# Patient Record
Sex: Female | Born: 1955
Health system: Southern US, Community
[De-identification: ages and names within clinical notes are randomized; demographics above are authoritative.]

## PROBLEM LIST (undated history)

## (undated) DIAGNOSIS — Z9189 Other specified personal risk factors, not elsewhere classified: Secondary | ICD-10-CM

## (undated) DIAGNOSIS — C3490 Malignant neoplasm of unspecified part of unspecified bronchus or lung: Secondary | ICD-10-CM

## (undated) DIAGNOSIS — E7800 Pure hypercholesterolemia, unspecified: Secondary | ICD-10-CM

## (undated) DIAGNOSIS — D630 Anemia in neoplastic disease: Secondary | ICD-10-CM

## (undated) DIAGNOSIS — I10 Essential (primary) hypertension: Secondary | ICD-10-CM

## (undated) DIAGNOSIS — D649 Anemia, unspecified: Secondary | ICD-10-CM

## (undated) DIAGNOSIS — K219 Gastro-esophageal reflux disease without esophagitis: Secondary | ICD-10-CM

## (undated) DIAGNOSIS — F419 Anxiety disorder, unspecified: Secondary | ICD-10-CM

## (undated) HISTORY — DX: Anxiety disorder, unspecified: F41.9

## (undated) HISTORY — DX: Essential (primary) hypertension: I10

## (undated) HISTORY — DX: Malignant neoplasm of unspecified part of unspecified bronchus or lung: C34.90

## (undated) HISTORY — PX: INSERTION / PLACEMENT PLEURAL CATHETER: SUR721

## (undated) HISTORY — DX: Anemia in neoplastic disease: D63.0

## (undated) HISTORY — PX: ABDOMINAL HYSTERECTOMY: SHX81

## (undated) HISTORY — DX: Anemia, unspecified: D64.9

## (undated) HISTORY — DX: Other specified personal risk factors, not elsewhere classified: Z91.89

## (undated) HISTORY — DX: Pure hypercholesterolemia, unspecified: E78.00

---

## 1997-12-21 ENCOUNTER — Ambulatory Visit: Admission: RE | Admit: 1997-12-21 | Discharge: 1997-12-21 | Payer: Self-pay | Admitting: General Surgery

## 1997-12-22 ENCOUNTER — Ambulatory Visit (HOSPITAL_COMMUNITY): Admission: RE | Admit: 1997-12-22 | Discharge: 1997-12-22 | Payer: Self-pay | Admitting: Obstetrics

## 1998-02-25 ENCOUNTER — Encounter: Admission: RE | Admit: 1998-02-25 | Discharge: 1998-02-25 | Payer: Self-pay | Admitting: Hematology and Oncology

## 1998-04-16 ENCOUNTER — Emergency Department (HOSPITAL_COMMUNITY): Admission: EM | Admit: 1998-04-16 | Discharge: 1998-04-16 | Payer: Self-pay | Admitting: Emergency Medicine

## 1999-02-03 ENCOUNTER — Other Ambulatory Visit: Admission: RE | Admit: 1999-02-03 | Discharge: 1999-02-03 | Payer: Self-pay | Admitting: Obstetrics

## 1999-02-03 ENCOUNTER — Encounter: Admission: RE | Admit: 1999-02-03 | Discharge: 1999-02-03 | Payer: Self-pay | Admitting: Obstetrics

## 1999-03-08 ENCOUNTER — Encounter: Payer: Self-pay | Admitting: Obstetrics

## 1999-03-08 ENCOUNTER — Ambulatory Visit (HOSPITAL_COMMUNITY): Admission: RE | Admit: 1999-03-08 | Discharge: 1999-03-08 | Payer: Self-pay | Admitting: Obstetrics

## 2000-11-20 ENCOUNTER — Encounter: Admission: RE | Admit: 2000-11-20 | Discharge: 2000-11-20 | Payer: Self-pay | Admitting: Obstetrics & Gynecology

## 2000-11-21 ENCOUNTER — Encounter: Payer: Self-pay | Admitting: Obstetrics

## 2000-11-21 ENCOUNTER — Ambulatory Visit (HOSPITAL_COMMUNITY): Admission: RE | Admit: 2000-11-21 | Discharge: 2000-11-21 | Payer: Self-pay | Admitting: Internal Medicine

## 2000-11-22 ENCOUNTER — Other Ambulatory Visit: Admission: RE | Admit: 2000-11-22 | Discharge: 2000-11-22 | Payer: Self-pay | Admitting: Obstetrics

## 2000-11-22 ENCOUNTER — Encounter: Admission: RE | Admit: 2000-11-22 | Discharge: 2000-11-22 | Payer: Self-pay | Admitting: Obstetrics

## 2003-07-16 ENCOUNTER — Encounter (INDEPENDENT_AMBULATORY_CARE_PROVIDER_SITE_OTHER): Payer: Self-pay | Admitting: Specialist

## 2003-07-16 ENCOUNTER — Inpatient Hospital Stay (HOSPITAL_COMMUNITY): Admission: RE | Admit: 2003-07-16 | Discharge: 2003-07-18 | Payer: Self-pay | Admitting: Obstetrics

## 2004-10-19 ENCOUNTER — Ambulatory Visit: Payer: Self-pay | Admitting: Gastroenterology

## 2004-10-21 ENCOUNTER — Ambulatory Visit (HOSPITAL_COMMUNITY): Admission: RE | Admit: 2004-10-21 | Discharge: 2004-10-21 | Payer: Self-pay | Admitting: Gastroenterology

## 2004-10-26 ENCOUNTER — Ambulatory Visit: Payer: Self-pay | Admitting: Gastroenterology

## 2005-02-20 ENCOUNTER — Encounter: Admission: RE | Admit: 2005-02-20 | Discharge: 2005-02-20 | Payer: Self-pay | Admitting: Orthopaedic Surgery

## 2005-03-09 ENCOUNTER — Encounter: Admission: RE | Admit: 2005-03-09 | Discharge: 2005-03-09 | Payer: Self-pay | Admitting: Orthopaedic Surgery

## 2005-09-18 DIAGNOSIS — Z789 Other specified health status: Secondary | ICD-10-CM

## 2005-09-18 HISTORY — DX: Other specified health status: Z78.9

## 2005-10-19 ENCOUNTER — Encounter: Admission: RE | Admit: 2005-10-19 | Discharge: 2005-10-19 | Payer: Self-pay | Admitting: Orthopaedic Surgery

## 2006-01-05 ENCOUNTER — Ambulatory Visit (HOSPITAL_COMMUNITY): Admission: RE | Admit: 2006-01-05 | Discharge: 2006-01-05 | Payer: Self-pay | Admitting: Orthopaedic Surgery

## 2006-01-08 ENCOUNTER — Encounter: Admission: RE | Admit: 2006-01-08 | Discharge: 2006-04-08 | Payer: Self-pay | Admitting: Orthopaedic Surgery

## 2007-05-08 ENCOUNTER — Encounter: Admission: RE | Admit: 2007-05-08 | Discharge: 2007-05-08 | Payer: Self-pay | Admitting: Orthopaedic Surgery

## 2007-12-13 ENCOUNTER — Encounter: Admission: RE | Admit: 2007-12-13 | Discharge: 2007-12-13 | Payer: Self-pay | Admitting: Orthopaedic Surgery

## 2008-02-03 ENCOUNTER — Encounter: Admission: RE | Admit: 2008-02-03 | Discharge: 2008-03-05 | Payer: Self-pay | Admitting: Orthopaedic Surgery

## 2008-04-29 ENCOUNTER — Encounter: Admission: RE | Admit: 2008-04-29 | Discharge: 2008-06-29 | Payer: Self-pay | Admitting: Orthopaedic Surgery

## 2008-05-09 ENCOUNTER — Emergency Department: Payer: Self-pay | Admitting: Unknown Physician Specialty

## 2008-08-27 ENCOUNTER — Encounter: Admission: RE | Admit: 2008-08-27 | Discharge: 2008-08-27 | Payer: Self-pay | Admitting: Orthopaedic Surgery

## 2008-09-09 ENCOUNTER — Encounter: Admission: RE | Admit: 2008-09-09 | Discharge: 2008-09-09 | Payer: Self-pay | Admitting: Orthopaedic Surgery

## 2009-07-26 ENCOUNTER — Emergency Department (HOSPITAL_COMMUNITY): Admission: EM | Admit: 2009-07-26 | Discharge: 2009-07-26 | Payer: Self-pay | Admitting: Family Medicine

## 2010-04-07 ENCOUNTER — Ambulatory Visit: Payer: Self-pay | Admitting: Family Medicine

## 2010-10-09 ENCOUNTER — Encounter: Payer: Self-pay | Admitting: Internal Medicine

## 2010-12-21 LAB — POCT I-STAT, CHEM 8
BUN: 19 mg/dL (ref 6–23)
Calcium, Ion: 1.17 mmol/L (ref 1.12–1.32)
Chloride: 105 mEq/L (ref 96–112)
Glucose, Bld: 100 mg/dL — ABNORMAL HIGH (ref 70–99)
Hemoglobin: 14.6 g/dL (ref 12.0–15.0)
Sodium: 138 mEq/L (ref 135–145)
TCO2: 24 mmol/L (ref 0–100)

## 2010-12-21 LAB — POCT URINALYSIS DIP (DEVICE)
Specific Gravity, Urine: 1.025 (ref 1.005–1.030)
pH: 6 (ref 5.0–8.0)

## 2011-02-03 NOTE — Op Note (Signed)
NAMESHAM, Patricia                      ACCOUNT NO.:  [id number]   MEDICAL RECORD NO.:  [id number]                   PATIENT TYPE:  INP   LOCATION:  9399                                 FACILITY:  [facility]   PHYSICIAN:  Charles A. Clearance Coots, M.D.             DATE OF BIRTH:  Feb 18, 1956   DATE OF PROCEDURE:  07/16/2003  DATE OF DISCHARGE:                                 OPERATIVE REPORT   PREOPERATIVE DIAGNOSES:  1. Symptomatic uterine fibroids.  2. Menorrhagia.   POSTOPERATIVE DIAGNOSES:  1. Symptomatic uterine fibroids.  2. Menorrhagia.   PROCEDURE:  Total abdominal hysterectomy, bilateral salpingectomy.   SURGEON:  Charles A. Clearance Coots, M.D.   ASSISTANT:  Roseanna Rainbow, M.D.   ANESTHESIA:  General.   ESTIMATED BLOOD LOSS:  500 mL.   FLUIDS REPLACED:  2000 mL.   URINE OUTPUT:  300 mL clear.   COMPLICATIONS:  None.   Foley to gravity.   FINDINGS:  Multiple uterine fibroids.   SPECIMENS:  Uterus with cervix, fibroids, two fallopian tubes.   OPERATION:  The patient was brought to the operating room and after  satisfactory general endotracheal anesthesia, the abdomen was prepped and  draped in the usual sterile fashion.  A Pfannenstiel skin incision was made  with a scalpel that was deepened down to the fascia with a scalpel.  The  fascia was nicked in the midline and the fascial incision was extended to  the left and to the right with curved Mayo scissors.  Superior and inferior  fascial edges were taken off of the rectus muscle with both blunt and sharp  dissection.  The rectus muscle was bluntly and sharply divided in the  midline superiorly and inferiorly, being careful to avoid the urinary  bladder inferiorly.  The peritoneum was entered.  The peritoneum was entered  digitally and was sharply extended superiorly and inferiorly, being careful  to avoid the urinary bladder inferiorly.  The uterus was then identified and  was exteriorized with multiple  subserosal and intramural fibroids.  The  right round ligament was then grasped with Kelly forceps and was cauterized  sharply with the Bovie down to the anterior reflection of the visceral  peritoneum of the urinary bladder.  Then the anterior reflection of the  bladder visceral peritoneum was then sharply divided anteriorly.  A window  was then made medially in the broad ligament digitally and parametrial  clamps were placed across the broad and utero-ovarian ligaments.  The medial  clamp was then also slid into the window created in the broad ligament and  the utero-ovarian and broad ligaments were then transected between the  clamps and a free tie of 0 Vicryl was placed beneath the pedicle clamp and a  second transfixion suture of 0 Vicryl was placed above the knot.  Hemostasis  was excellent.  The same procedure was performed on the opposite side  without complications.  The uterine vessels  were then skeletonized and  clamped bilaterally, transected, and suture ligated with transfixion sutures  of 0 Vicryl.  The urinary bladder was then sharply transected away from the  anterior cervix and dropped down away from the operative field.  The  cardinal ligaments were then serially clamped, transected, and suture  ligated bilaterally with transfixion sutures of 0 Vicryl down to the  uterosacral ligaments.  The uterosacral ligaments were then clamped,  transected bilaterally, and suture ligated with transfixion sutures of 0  Vicryl, and this suture was held with the hemostat for future identification  and closure of the cuff.  The two parametrial clamps were then placed across  the vaginal cuff and the cervical end of the uterus was transected and the  cervix along with the uterus was handed off as a specimen for pathology.  The vaginal cuff was then closed with each corner being closed with a  transfixion suture of 0 Vicryl.  The center of the vaginal cuff was closed  with a figure-of-eight  suture of 0 Vicryl.  Hemostasis was excellent.  Attention was then turned to the fallopian tubes and the left fallopian tube  was grasped wit a Babcock clamp and a Kelly forceps was placed across the  proximal end of the fallopian tube, extending beneath the fimbriae, and the  fallopian tube was then transected and the stump was suture ligated with a  transfixion suture of 0 Vicryl.  The same procedure was performed on the  opposite side without complications.  The pelvic cavity was then thoroughly  irrigated with warm saline solution and all clots were removed.  The  vascular stumps were observed for hemostasis and there was no active  bleeding noted from any of the vascular stumps, and the vaginal cuff was  also inspected for hemostasis and there was no active bleeding noted.  The  pelvic cavity was again lavaged with warm saline solution.  There was no  active bleeding noted.  The O'Connor-O'Sullivan retractor that had been  placed in the incision was then removed and all packing was removed.  Surgical technician indicated that all needle, sponge, and instrument counts  were correct.  The abdomen was then closed as follows:  The peritoneum was  closed with a continuous suture of 2-0 Monocryl, the fascia was closed with  a continuous suture of 0 PDS from each corner to the center, the  subcutaneous tissue was thoroughly irrigated with warm saline solution and  all areas of subcutaneous bleeding were coagulated with the Bovie.  The skin  was then approximated with continuous subcuticular suture of 3-0 Monocryl.  A sterile bandage was applied to the incision closure.  The surgical  technician indicated that all needle, sponge, and instrument counts were  correct x2.  The patient tolerated the procedure well, was transported to  the recovery room in satisfactory condition.                                                Charles A. Clearance Coots, M.D.    CAH/MEDQ  D:  07/16/2003  T:   07/16/2003  Job:  [id number]

## 2011-12-19 ENCOUNTER — Ambulatory Visit: Payer: Self-pay | Admitting: Family Medicine

## 2013-11-06 ENCOUNTER — Other Ambulatory Visit: Payer: Self-pay | Admitting: Neurological Surgery

## 2013-11-06 DIAGNOSIS — M5000 Cervical disc disorder with myelopathy, unspecified cervical region: Secondary | ICD-10-CM

## 2013-11-15 ENCOUNTER — Ambulatory Visit
Admission: RE | Admit: 2013-11-15 | Discharge: 2013-11-15 | Disposition: A | Discharge status: Home / Self Care | Payer: 59 | Source: Ambulatory Visit | Attending: Neurological Surgery | Admitting: Neurological Surgery

## 2013-11-15 DIAGNOSIS — M5000 Cervical disc disorder with myelopathy, unspecified cervical region: Secondary | ICD-10-CM

## 2014-11-17 ENCOUNTER — Ambulatory Visit
Admit: 2014-11-17 | Disposition: A | Discharge status: Home / Self Care | Payer: Self-pay | Attending: Internal Medicine | Admitting: Internal Medicine

## 2014-11-30 ENCOUNTER — Inpatient Hospital Stay: Payer: Self-pay | Admitting: Internal Medicine

## 2014-12-08 ENCOUNTER — Ambulatory Visit
Admit: 2014-12-08 | Disposition: A | Discharge status: Home / Self Care | Payer: Self-pay | Attending: Internal Medicine | Admitting: Internal Medicine

## 2014-12-09 ENCOUNTER — Ambulatory Visit: Payer: Self-pay | Admitting: Internal Medicine

## 2014-12-10 ENCOUNTER — Ambulatory Visit: Admit: 2014-12-10 | Disposition: A | Discharge status: Home / Self Care | Payer: Self-pay | Admitting: Internal Medicine

## 2014-12-14 LAB — CBC CANCER CENTER
Basophil #: 0.2 "x10 3/mm " — ABNORMAL HIGH
Basophil %: 0.7 %
Eosinophil #: 0.4 "x10 3/mm "
Eosinophil %: 1.3 %
HCT: 34.5 % — ABNORMAL LOW
HGB: 11.3 g/dL — ABNORMAL LOW
Lymphocyte %: 5.8 %
Lymphs Abs: 1.6 "x10 3/mm "
MCH: 25.9 pg — ABNORMAL LOW
MCHC: 32.8 g/dL
MCV: 79 fL — ABNORMAL LOW
Monocyte #: 1.7 "x10 3/mm " — ABNORMAL HIGH
Monocyte %: 6.1 %
Neutrophil #: 23.4 "x10 3/mm " — ABNORMAL HIGH
Neutrophil %: 86.1 %
Platelet: 472 "x10 3/mm " — ABNORMAL HIGH
RBC: 4.37 "x10 6/mm "
RDW: 13.1 %
WBC: 27.2 "x10 3/mm " — ABNORMAL HIGH

## 2014-12-14 LAB — COMPREHENSIVE METABOLIC PANEL
ALBUMIN: 3.1 g/dL — AB
ALK PHOS: 49 U/L
ALT: 45 U/L
Anion Gap: 5 — ABNORMAL LOW (ref 7–16)
BUN: 30 mg/dL — AB
Bilirubin,Total: 0.4 mg/dL
CALCIUM: 8.6 mg/dL — AB
CO2: 24 mmol/L
CREATININE: 0.65 mg/dL
Chloride: 102 mmol/L
EGFR (African American): >60
EGFR (Non-African Amer.): >60
GLUCOSE: 136 mg/dL — AB
Potassium: 3.9 mmol/L
SGOT(AST): 35 U/L
SODIUM: 131 mmol/L — AB
Total Protein: 6.2 g/dL — ABNORMAL LOW

## 2014-12-18 ENCOUNTER — Ambulatory Visit
Admit: 2014-12-18 | Disposition: A | Discharge status: Home / Self Care | Payer: Self-pay | Attending: Internal Medicine | Admitting: Internal Medicine

## 2014-12-21 LAB — CBC CANCER CENTER
BASOS PCT: 0.4 %
Basophil #: 0.1 x10 3/mm (ref 0.0–0.1)
EOS PCT: 1 %
Eosinophil #: 0.3 x10 3/mm (ref 0.0–0.7)
HCT: 38.4 % (ref 35.0–47.0)
HGB: 12.3 g/dL (ref 12.0–16.0)
LYMPHS PCT: 6.4 %
Lymphocyte #: 1.7 x10 3/mm (ref 1.0–3.6)
MCH: 25.6 pg — ABNORMAL LOW (ref 26.0–34.0)
MCHC: 32.1 g/dL (ref 32.0–36.0)
MCV: 80 fL (ref 80–100)
Monocyte #: 1.1 x10 3/mm — ABNORMAL HIGH (ref 0.2–0.9)
Monocyte %: 4 %
NEUTROS PCT: 88.2 %
Neutrophil #: 23.9 x10 3/mm — ABNORMAL HIGH (ref 1.4–6.5)
PLATELETS: 389 x10 3/mm (ref 150–440)
RBC: 4.8 10*6/uL (ref 3.80–5.20)
RDW: 13.1 % (ref 11.5–14.5)
WBC: 27.1 x10 3/mm — ABNORMAL HIGH (ref 3.6–11.0)

## 2014-12-29 ENCOUNTER — Inpatient Hospital Stay
Admit: 2014-12-29 | Disposition: A | Discharge status: Home / Self Care | Payer: Self-pay | Attending: Internal Medicine | Admitting: Internal Medicine

## 2014-12-29 LAB — URINALYSIS, COMPLETE
Bilirubin,UR: NEGATIVE
Glucose,UR: NEGATIVE mg/dL (ref 0–75)
Ketone: NEGATIVE
Nitrite: NEGATIVE
PROTEIN: NEGATIVE
Ph: 5 (ref 4.5–8.0)

## 2014-12-29 LAB — DIFFERENTIAL
Basophil #: 0 10*3/uL (ref 0.0–0.1)
Basophil %: 0.2 %
EOS ABS: 0 10*3/uL (ref 0.0–0.7)
Eosinophil %: 1.4 %
LYMPHS PCT: 29.3 %
Lymphocyte #: 0.9 10*3/uL — ABNORMAL LOW (ref 1.0–3.6)
MONOS PCT: 10 %
Monocyte #: 0.3 x10 3/mm (ref 0.2–0.9)
Neutrophil #: 1.8 10*3/uL (ref 1.4–6.5)
Neutrophil %: 59.1 %

## 2014-12-29 LAB — COMPREHENSIVE METABOLIC PANEL
ALK PHOS: 77 U/L
ANION GAP: 11 (ref 7–16)
AST: 26 U/L
Albumin: 3 g/dL — ABNORMAL LOW
BILIRUBIN TOTAL: 1.7 mg/dL — AB
BUN: 30 mg/dL — ABNORMAL HIGH
CALCIUM: 8.5 mg/dL — AB
Chloride: 100 mmol/L — ABNORMAL LOW
Co2: 22 mmol/L
Creatinine: 0.78 mg/dL
EGFR (African American): >60
EGFR (Non-African Amer.): >60
Glucose: 118 mg/dL — ABNORMAL HIGH
Potassium: 4.3 mmol/L
SGPT (ALT): 57 U/L — ABNORMAL HIGH
Sodium: 133 mmol/L — ABNORMAL LOW
Total Protein: 6.8 g/dL

## 2014-12-29 LAB — TROPONIN I: Troponin-I: <0.03 ng/mL

## 2014-12-29 LAB — PROTIME-INR
INR: 1.1
Prothrombin Time: 13.9 secs

## 2014-12-29 LAB — CBC
HCT: 33.8 % — ABNORMAL LOW (ref 35.0–47.0)
HGB: 11 g/dL — AB (ref 12.0–16.0)
MCH: 26.2 pg (ref 26.0–34.0)
MCHC: 32.4 g/dL (ref 32.0–36.0)
MCV: 81 fL (ref 80–100)
Platelet: 158 10*3/uL (ref 150–440)
RBC: 4.19 10*6/uL (ref 3.80–5.20)
RDW: 13.9 % (ref 11.5–14.5)
WBC: 3 10*3/uL — AB (ref 3.6–11.0)

## 2014-12-29 LAB — RAPID HIV SCREEN (HIV 1/2 AB+AG)

## 2014-12-29 LAB — LACTIC ACID, PLASMA
Lactic Acid, Venous: 2.6 mmol/L
Lactic Acid, Venous: 2.4 mmol/L

## 2014-12-29 LAB — APTT: Activated PTT: 28.8 secs (ref 23.6–35.9)

## 2014-12-30 LAB — CBC WITH DIFFERENTIAL/PLATELET
BASOS PCT: 0.1 %
Basophil #: 0 10*3/uL (ref 0.0–0.1)
EOS PCT: 3.5 %
Eosinophil #: 0.2 10*3/uL (ref 0.0–0.7)
HCT: 32.2 % — AB (ref 35.0–47.0)
HGB: 10.3 g/dL — AB (ref 12.0–16.0)
LYMPHS PCT: 10.3 %
Lymphocyte #: 0.7 10*3/uL — ABNORMAL LOW (ref 1.0–3.6)
MCH: 26.3 pg (ref 26.0–34.0)
MCHC: 32.2 g/dL (ref 32.0–36.0)
MCV: 82 fL (ref 80–100)
MONOS PCT: 7.9 %
Monocyte #: 0.5 x10 3/mm (ref 0.2–0.9)
NEUTROS ABS: 5.3 10*3/uL (ref 1.4–6.5)
Neutrophil %: 78.2 %
Platelet: 164 10*3/uL (ref 150–440)
RBC: 3.94 10*6/uL (ref 3.80–5.20)
RDW: 14 % (ref 11.5–14.5)
WBC: 6.8 10*3/uL (ref 3.6–11.0)

## 2014-12-30 LAB — COMPREHENSIVE METABOLIC PANEL
Albumin: 1.9 g/dL — ABNORMAL LOW
Alkaline Phosphatase: 87 U/L
Anion Gap: 5 — ABNORMAL LOW (ref 7–16)
BILIRUBIN TOTAL: 1.9 mg/dL — AB
BUN: 18 mg/dL
CO2: 20 mmol/L — AB
CREATININE: 0.58 mg/dL
Calcium, Total: 7.1 mg/dL — ABNORMAL LOW
Chloride: 108 mmol/L
EGFR (Non-African Amer.): >60
GLUCOSE: 114 mg/dL — AB
Potassium: 4.2 mmol/L
SGOT(AST): 24 U/L
SGPT (ALT): 46 U/L
Sodium: 133 mmol/L — ABNORMAL LOW
Total Protein: 5.3 g/dL — ABNORMAL LOW

## 2014-12-30 LAB — PROTIME-INR
INR: 1.5
Prothrombin Time: 17.9 secs — ABNORMAL HIGH

## 2014-12-30 LAB — MAGNESIUM: MAGNESIUM: 1.6 mg/dL — AB

## 2014-12-31 LAB — VANCOMYCIN, TROUGH: VANCOMYCIN, TROUGH: 8 ug/mL — AB

## 2015-01-02 LAB — BASIC METABOLIC PANEL
Anion Gap: 5 — ABNORMAL LOW (ref 7–16)
BUN: 6 mg/dL
CALCIUM: 7.5 mg/dL — AB
CREATININE: 0.51 mg/dL
Chloride: 108 mmol/L
Co2: 25 mmol/L
EGFR (African American): >60
Glucose: 103 mg/dL — ABNORMAL HIGH
Potassium: 3.4 mmol/L — ABNORMAL LOW
Sodium: 138 mmol/L

## 2015-01-02 LAB — CBC WITH DIFFERENTIAL/PLATELET
BASOS PCT: 0.5 %
Basophil #: 0.1 10*3/uL (ref 0.0–0.1)
Eosinophil #: 0.2 10*3/uL (ref 0.0–0.7)
Eosinophil %: 2.1 %
HCT: 27.5 % — AB (ref 35.0–47.0)
HGB: 8.9 g/dL — AB (ref 12.0–16.0)
Lymphocyte #: 1.2 10*3/uL (ref 1.0–3.6)
Lymphocyte %: 12.1 %
MCH: 25.7 pg — AB (ref 26.0–34.0)
MCHC: 32.3 g/dL (ref 32.0–36.0)
MCV: 80 fL (ref 80–100)
Monocyte #: 0.8 x10 3/mm (ref 0.2–0.9)
Monocyte %: 8.1 %
NEUTROS ABS: 7.8 10*3/uL — AB (ref 1.4–6.5)
Neutrophil %: 77.2 %
Platelet: 255 10*3/uL (ref 150–440)
RBC: 3.46 10*6/uL — AB (ref 3.80–5.20)
RDW: 14.1 % (ref 11.5–14.5)
WBC: 10.1 10*3/uL (ref 3.6–11.0)

## 2015-01-02 LAB — BODY FLUID CULTURE

## 2015-01-02 LAB — URINALYSIS, COMPLETE
BILIRUBIN, UR: NEGATIVE
Glucose,UR: NEGATIVE mg/dL (ref 0–75)
KETONE: NEGATIVE
NITRITE: NEGATIVE
PROTEIN: NEGATIVE
Ph: 6 (ref 4.5–8.0)
SPECIFIC GRAVITY: 1.008 (ref 1.003–1.030)

## 2015-01-03 LAB — CULTURE, BLOOD (SINGLE)

## 2015-01-04 LAB — CBC WITH DIFFERENTIAL/PLATELET
Basophil #: 0.1 10*3/uL (ref 0.0–0.1)
Basophil %: 0.4 %
EOS PCT: 1.3 %
Eosinophil #: 0.2 10*3/uL (ref 0.0–0.7)
HCT: 25.6 % — ABNORMAL LOW (ref 35.0–47.0)
HGB: 8.2 g/dL — ABNORMAL LOW (ref 12.0–16.0)
Lymphocyte #: 1.5 10*3/uL (ref 1.0–3.6)
Lymphocyte %: 10.4 %
MCH: 25.9 pg — AB (ref 26.0–34.0)
MCHC: 32.2 g/dL (ref 32.0–36.0)
MCV: 80 fL (ref 80–100)
MONO ABS: 0.7 x10 3/mm (ref 0.2–0.9)
MONOS PCT: 4.9 %
NEUTROS PCT: 83 %
Neutrophil #: 11.7 10*3/uL — ABNORMAL HIGH (ref 1.4–6.5)
PLATELETS: 329 10*3/uL (ref 150–440)
RBC: 3.18 10*6/uL — AB (ref 3.80–5.20)
RDW: 13.9 % (ref 11.5–14.5)
WBC: 14.1 10*3/uL — AB (ref 3.6–11.0)

## 2015-01-04 LAB — BASIC METABOLIC PANEL
ANION GAP: 7 (ref 7–16)
BUN: 8 mg/dL
CALCIUM: 7.6 mg/dL — AB
CO2: 28 mmol/L
Chloride: 104 mmol/L
Creatinine: 0.57 mg/dL
EGFR (African American): >60
Glucose: 100 mg/dL — ABNORMAL HIGH
Potassium: 3.3 mmol/L — ABNORMAL LOW
Sodium: 139 mmol/L

## 2015-01-07 LAB — CULTURE, BLOOD (SINGLE)

## 2015-01-11 LAB — BASIC METABOLIC PANEL
ANION GAP: 9 (ref 7–16)
BUN: 9 mg/dL
Calcium, Total: 7.9 mg/dL — ABNORMAL LOW
Chloride: 104 mmol/L
Co2: 25 mmol/L
Creatinine: 0.8 mg/dL
EGFR (Non-African Amer.): >60
GLUCOSE: 142 mg/dL — AB
Potassium: 3.4 mmol/L — ABNORMAL LOW
Sodium: 138 mmol/L

## 2015-01-11 LAB — CBC CANCER CENTER
BASOS ABS: 0.2 x10 3/mm — AB (ref 0.0–0.1)
BASOS PCT: 1.3 %
EOS ABS: 0.1 x10 3/mm (ref 0.0–0.7)
Eosinophil %: 0.6 %
HCT: 24.2 % — AB (ref 35.0–47.0)
HGB: 7.9 g/dL — AB (ref 12.0–16.0)
LYMPHS PCT: 7.4 %
Lymphocyte #: 1.2 x10 3/mm (ref 1.0–3.6)
MCH: 25.8 pg — AB (ref 26.0–34.0)
MCHC: 32.8 g/dL (ref 32.0–36.0)
MCV: 79 fL — ABNORMAL LOW (ref 80–100)
MONO ABS: 1.5 x10 3/mm — AB (ref 0.2–0.9)
Monocyte %: 9.1 %
NEUTROS ABS: 13.7 x10 3/mm — AB (ref 1.4–6.5)
Neutrophil %: 81.6 %
Platelet: 646 x10 3/mm — ABNORMAL HIGH (ref 150–440)
RBC: 3.07 10*6/uL — ABNORMAL LOW (ref 3.80–5.20)
RDW: 14.1 % (ref 11.5–14.5)
WBC: 16.8 x10 3/mm — ABNORMAL HIGH (ref 3.6–11.0)

## 2015-01-13 DIAGNOSIS — A4902 Methicillin resistant Staphylococcus aureus infection, unspecified site: Secondary | ICD-10-CM | POA: Insufficient documentation

## 2015-01-13 DIAGNOSIS — J869 Pyothorax without fistula: Secondary | ICD-10-CM | POA: Insufficient documentation

## 2015-01-13 DIAGNOSIS — C3481 Malignant neoplasm of overlapping sites of right bronchus and lung: Secondary | ICD-10-CM | POA: Insufficient documentation

## 2015-01-17 ENCOUNTER — Other Ambulatory Visit: Payer: Self-pay | Admitting: *Deleted

## 2015-01-17 DIAGNOSIS — C7931 Secondary malignant neoplasm of brain: Secondary | ICD-10-CM

## 2015-01-17 DIAGNOSIS — C7802 Secondary malignant neoplasm of left lung: Secondary | ICD-10-CM

## 2015-01-17 DIAGNOSIS — C3491 Malignant neoplasm of unspecified part of right bronchus or lung: Secondary | ICD-10-CM

## 2015-01-17 NOTE — Discharge Summary (Signed)
PATIENT NAME:  Patricia Kelley, Patricia Kelley MR#:  [id number] DATE OF BIRTH:  1955/12/26  DATE OF ADMISSION:  11/30/2014 DATE OF DISCHARGE:  12/02/2014  ADMITTING PHYSICIAN: Dr. Myrtis Ser.  DISCHARGING PHYSICIAN: Gladstone Lighter, M.D.   PATIENT'S PRIMARY CARE MEDICAL DOCTOR:  Dr. Delight Stare.  CONSULTATIONS IN THE HOSPITAL:  Oncology consultation by Dr. Ma Hillock.   DISCHARGE DIAGNOSES:  1.  Metastatic adenocarcinoma of unknown primary.  2.  Significant right pleural effusion with lung whiteout status post drainage, after that 1.7 liters of malignant fluid.  3.  Hypertension.  4.  Hyperlipidemia,  5.  Arthritis. 6.  Gastroesophageal reflux disease.  DISCHARGE HOME MEDICATIONS:   1.  Flexeril 5 mg p.o. 3 times a day as needed for muscle spasms.  2.  Simvastatin 20 mg p.o. at bedtime.  3.  Lisinopril/HCTZ 20/25 mg 1 tablet p.o. daily.  4.  Omeprazole 20 mg p.o. daily.  5.  Multivitamin 1 tablet p.o. daily.  6.  Tramadol 50 mg p.o. q. 6 hours p.r.n. for pain.  7.  Cheratussin 10 mL q. 6 hours p.r.n. for cough. 8.  Benzonatate 100 mg p.o. q. 6 hours p.r.n. for cough.  9.  Combivent Respimat 1 puff 4 times a day. 10.  Levaquin 750 mg p.o. daily for 5 more days.  DISCHARGE DIET:  Low sodium.  DISCHARGE ACTIVITY:  As tolerated.  FOLLOWUP INSTRUCTIONS:  1.  Follow up with Dr. Ma Hillock in 5 days.   2.  PCP follow up in 1-2 weeks.   LABORATORY AND IMAGING STUDIES PRIOR TO DISCHARGE:   1.  Sodium 135, potassium 3.4, chloride 100, bicarb 25, BUN 23, creatinine 0.87, glucose 131, and calcium of 8.5.   2.  WBC 14.6, hemoglobin 12.7, hematocrit 38.9 and platelet count 336,000.   3. Chest x-ray prior to discharge on 12/02/2014 showing moderate right pleural effusion now.  Widespread bilateral pulmonary nodularity giving appearance of widespread metastatic disease.  Pleural fluid culture is negative.  Pleural fluid cytology growing adenocarcinoma cells.  Pleural fluid LDH is elevated at 748,  elevated amylase of > 2000.   Glucose and protein are within normal limits.  4.  INR is 1.0.   5.  CT of the chest, abdomen, and pelvis showing extensive metastatic disease to the chest with a large right-sided malignant pleural effusion and enhanced pleural nodularity. The right supraclavicular upper mediastinal adenopathy and possible endogenous spread leading to multiple pulmonary nodules throughout the left lung.  No PE, atherosclerotic vascular calcifications noted. There is upper retroperitoneal, upper abdominal lymphadenopathy consistent with nodal metastasis.  There is also a nonspecific lesion in left kidney, could represent metastatic lesion. No other evidence of intraabdominal or pelvic metastasis.   BRIEF HOSPITAL COURSE:  Patricia Kelley is a 59 year old  African American female with past medical history significant for hypertension, hyperlipidemia, arthritis, presents to the hospital secondary to worsening weakness, dyspnea, and noted to have significant right pleural effusion.  1.  Metastatic pleural effusion, metastatic adenocarcinoma of unknown primary at this time. The patient was noted to have severe right pleural effusion almost compressing the right lung.  Admitted, had a pleural effusion tap done and almost 1.7 liters of pleural fluid was taken out.  Gram stain is negative.  Cytology preliminary report showing adenocarcinoma. Unknown primary at this point, so other tumor markers have been ordered; however, CT abdomen, pelvis, and chest showing multiple pulmonary nodules, pleural thickening, malignant pleural effusion, and lymph node spread.  Patient was seen by Dr. Ma Hillock in the  hospital and she will follow up with Dr. Ma Hillock in 5 days by which time her flow cytometry and other results and her tumor marker results will be back and they will discuss possible palliative chemotherapy at that point. The patient's symptoms have significantly improved after pleural tap. She was placed on inhaler to  help with her breathing. She was able to be weaned off of oxygen at this time. She will follow up with oncology as an outpatient.  2.  Hypertension. The patient is being discharge on lisinopril/HCTZ at this time.  3.  Pneumonia. She probably has compressive atelectasis and pneumonia as noted on her CT and x-ray. She was started on Levaquin in the hospital and will finish out the course.  4.  Her course has been otherwise uneventful in the hospital.   DISCHARGE CONDITION: Stable.   DISCHARGE DISPOSITION: Home.  Time spent on discharge is 40 minutes.   CODE STATUS:  Full.   ____________________________ Gladstone Lighter, MD rk:sp D: 12/02/2014 15:48:37 ET T: 12/02/2014 16:15:21 ET JOB#: [id number]  cc: Gladstone Lighter, MD, <Dictator> Sandeep R. Ma Hillock, MD Marguerita Merles, MD Gladstone Lighter MD ELECTRONICALLY SIGNED 12/03/2014 18:34

## 2015-01-17 NOTE — Consult Note (Signed)
PATIENT NAME:  Patricia Kelley, Patricia Kelley MR#:  [id number] DATE OF BIRTH:  10/10/1955  DATE OF CONSULTATION:  12/29/2014  REFERRING PHYSICIAN:  Vipul S. Manuella Ghazi, MD CONSULTING PHYSICIAN:  Norelle Runnion R. Ma Hillock, MD  REASON FOR CONSULTATION: Metastatic lung cancer, admitted with possible empyema.   HISTORY OF PRESENT ILLNESS: The patient is a 59 year old female who was recently hospitalized and had large right pleural effusion with left lung nodules. Pleural fluid cytology was positive for adenocarcinoma, consistent with lung origin on March 15. She also had a head CT, which showed metastasis and has started on palliative brain radiation. She received first cycle of palliative chemotherapy with Alimta and carboplatin on March 28. The patient presented to hospital with worsening shortness of breath along with redness and pain at the site of the right Pleurx catheter. She has been seen by Dr. Genevive Bi today and drained about 500 mL of purulent fluid and is now on continuous drainage by Pleurx catheter. Lactic acid is elevated. She is currently in CCU due to hypotension and likely sepsis. The patient states that since getting chemotherapy, her respiratory status actually had improved quite a bit and she was able to ambulate at home and do some activities/chores, but condition worsened over the last couple of days. CBC today shows WBC of 3000 with 59% neutrophils, ANC is 1800.   PAST MEDICAL HISTORY AND PAST SURGICAL HISTORY: 1.  Stage IV non-small cell lung cancer.  2.  Hypertension and osteoarthritis.  3.  Breast lump removed about 10 years ago.  4.  Partial hysterectomy 20 years ago.   FAMILY HISTORY: Mother had cervical cancer, grandmother had uterine cancer.   SOCIAL HISTORY: Ex-smoker, quit about 1-1/2 months ago. Social alcohol intake only. Works at [employer].   ALLERGIES: No known allergies.   HOME MEDICATIONS: Alprazolam 0.5 mg at bedtime p.r.n., cyclobenzaprine 5 mg t.i.d. p.r.n.,  dexamethasone taper for brain metastasis, hydrochlorothiazide/lisinopril 25/20 mg 1 daily, omeprazole 20 mg once daily, One-A-Day vitamin, promethazine 25 mg q. 6 hours p.r.n., simvastatin 20 mg at bedtime, tramadol 50 mg q. 6 hours p.r.n.   REVIEW OF SYSTEMS: CONSTITUTIONAL: Severe weakness, dyspnea. Mild fever. No chills.  HEENT: Dizzy on getting up. No headaches, epistaxis, ear, or jaw pain.  CARDIAC: No angina, palpitation, orthopnea, or PND.  LUNGS:  Severe dyspnea, right-sided chest pain and discomfort. Cough, no hemoptysis.  GASTROINTESTINAL: No nausea, vomiting, or diarrhea. No bright red blood in stools or melena.  GENITOURINARY: No dysuria or hematuria.  MUSCULOSKELETAL: No new bone pains.  HEMATOLOGIC: No bleeding issues.  SKIN: No new rashes or bruising otherwise.  NEUROLOGIC: No new focal weakness, seizures, or loss of consciousness.  ENDOCRINE: No polyuria or polydipsia.   PHYSICAL EXAMINATION: GENERAL: Patient is tired-looking, mildly tachypneic on speaking, on nasal cannula oxygen. Otherwise, alert and oriented and converses appropriately. No icterus. Mild pallor.  VITAL SIGNS: 98.3, 119, 19, 71/48, 94% on 2 liters.  HEENT: Normocephalic, atraumatic. Extraocular movements intact. No oral thrush.  NECK: Negative for lymphadenopathy.  CARDIOVASCULAR: S1, S2, regular rate and rhythm, tachycardic.  LUNGS: Show bilateral decreased breath sounds, more so in the right lower lobe area. No crepitations or rhonchi noted. There is redness and tenderness around the site of right Pleurx catheter, there is purulent drainage from the Pleurx tube. ABDOMEN: Soft, nontender, bowel sounds present.  EXTREMITIES: No major edema or cyanosis.  SKIN: No generalized rashes or major bruising otherwise.  NEUROLOGIC: Limited exam. Cranial nerves are intact. Moves all extremities spontaneously.  MUSCULOSKELETAL: No obvious swelling or redness in joints.   LABORATORY RESULTS: WBC 3000. ANC 1600,  hemoglobin 11, platelets 158,000, creatinine 0.78, calcium 8.5, glucose 118. PT, PTT unremarkable. Lactic acid 2.6. HIV nonreactive.   IMPRESSION AND RECOMMENDATIONS: A 59 year old female patient with a history of extremely aggressive, recently diagnosed non-small cell lung cancer (adenocarcinoma) with right Pleurx catheter inserted last month and status post cycle 1 palliative chemotherapy with carboplatin and Alimta on 12/14/2014. Also on palliative brain radiation for brain metastasis. The patient is now admitted with progressive dyspnea, hypotension, elevated lactic acid and  purulent drainage from Pleurx catheter likely representing empyema. Likely has ongoing sepsis from infection in the right lower chest area given that there is also erythema and tenderness around the Pleurx catheter site. CBC today shows mild leukopenia, but absolute neutrophil count is low normal range and adequate. Continue to monitor CBC and differential daily. If she starts developing neutropenia, we will need to consider G-CSF support given ongoing severe infectious state. Agree with ongoing treatment with broad-spectrum antibiotic coverage. Await cultures. Infectious disease also following. Thoracic surgeon, Dr. Genevive Bi, is following. No major pain issues at this time. CT scan of the chest done today does not show any progression of lung metastasis or pulmonary embolism. There is extensive loculated effusion on the right side. I have explained to the patient that once acute symptoms improve, then she can resume on brain radiation and once current acutely infectious state is improved, then we will consider continuing on chemotherapy depending on her condition at that time. The patient is agreeable to this plan. We will continue to follow as indicated.   Thank you for the referral. Please feel free to contact me for any additional questions.    ____________________________ Rhett Bannister Ma Hillock, MD srp:LT D: 12/29/2014 18:00:26  ET T: 12/29/2014 18:56:14 ET JOB#: [id number]  cc: Tuyet Bader R. Ma Hillock, MD, <Dictator> Alveta Heimlich MD ELECTRONICALLY SIGNED 12/30/2014 9:16

## 2015-01-17 NOTE — H&P (Signed)
PATIENT NAME:  Patricia Kelley, Patricia Kelley MR#:  [id number] DATE OF BIRTH:  Aug 28, 1956  DATE OF ADMISSION:  11/30/2014  PRIMARY CARE PHYSICIAN:  Dr. Delight Stare.  REFERRING EMERGENCY ROOM PHYSICIAN:  Dr. Loura Pardon.   CHIEF COMPLAINT: Shortness of breath and weakness.   HISTORY OF PRESENT ILLNESS: This very pleasant 59 year old African American woman with past medical history of hypertension and hyperlipidemia presents today with about 2 weeks of profound weakness and increasing shortness of breath. She reports that she was in her usual state of health until about two weeks ago when she developed shingles on the back of the left neck and over the left side of her chest. Since that time she has felt diffusely weak.  She was treated for shingles by her primary care physician but never felt very much better. She returned to her primary care and was treated with antibiotics for an upper respiratory tract infection due to shortness of breath and cough. She has been on amoxicillin for five days with no improvement. Today, she felt so weak that she was unable to go to work or drive. She called EMS for Emergency Room evaluation. On chest x-ray she was found to have a large right sided pleural effusion. On chest CT she was found to have a malignant -appearing effusion with multiple nodules throughout the left lung. She is being admitted for further evaluation and treatment.   PAST MEDICAL HISTORY:  1.  Hypertension.  2.  Hyperlipidemia.  3.  Osteoarthritis.   PAST SURGICAL HISTORY:  1.  Breast lump removal about 10 years ago in [place], benign pathology.  2.  Partial hysterectomy approximately 20 years ago.   SOCIAL HISTORY: The patient lives with her husband. She is a former smoker. She quit about one month ago. She has always been a light smoker, smoking less than 1 pack per week. She drinks alcohol socially, about a 6 pack on the weekends. She does not use any illicit substances. She currently works at  [employer].   FAMILY MEDICAL HISTORY: Positive for coronary artery disease, hypertension, rheumatoid arthritis, also positive for uterine cancer in her mother and ovarian cancer in her grandmother.    REVIEW OF SYSTEMS:  CONSTITUTIONAL: Positive for fatigue and weakness. No pain. No change in weight. No fevers or sweats.  HEENT: No change in vision or hearing. No pain in the eyes or ears. No sore throat or difficulty swallowing. No sinus congestion.  RESPIRATORY: Positive for shortness of breath with exertion. No coughing, occasional wheezing. No hemoptysis.  CARDIOVASCULAR: No chest pain, edema, palpitations, or syncope.  GASTROINTESTINAL: No nausea, vomiting, diarrhea, abdominal pain, or change in bowel habits.  GENITOURINARY: No dysuria or frequency.  ENDOCRINE: No polyuria, polydipsia, hot or cold intolerance.  HEMATOLOGIC: No easy bruising or excessive bleeding.  SKIN: She has had recent shingles and has herpetic neuralgia over the right shoulder as well as a rash in that area. No other new rashes or changes.  MUSCULOSKELETAL: No new pain in the neck, back, shoulders, knees, or hips.  No gout.  NEUROLOGIC: No focal numbness or weakness. No seizure disorder, CVA, history or dementia.  PSYCHIATRIC: No bipolar disorder or schizophrenia.   PHYSICAL EXAMINATION:  VITAL SIGNS: Temperature 98.1, pulse 118, respirations 18, blood pressure 137/96, oxygenation 98% on 2 liters nasal cannula.  GENERAL: No acute distress.  HEENT: Pupils equal, round, and reactive to light. Conjunctivae are clear. Extraocular motion is intact. Oral mucous membranes are pink and moist. Good dentition.  Posterior oropharynx is clear with no exudate, edema, or erythema. She has mild shotty tender lymphadenopathy in the left anterior cervical chain. Trachea is midline, thyroid nontender.  RESPIRATORY: Decreased breath sounds on the right throughout all lung fields with scattered crackles over the left, good  air movement in the left lung. No respiratory distress. No wheezing. No coughing during examination.  CARDIOVASCULAR: Regular rate and rhythm. No murmurs, rubs, or gallops. No peripheral edema. Peripheral pulses are 2+. No JVD. No carotid bruit.  ABDOMEN: Soft, nontender, nondistended, obese. Bowel sounds are normal. No guarding, no rebound, and no hepatosplenomegaly or mass noted.  MUSCULOSKELETAL: No joint effusions. Range of motion is normal. Strength is 5 out of 5 throughout.  SKIN: She has some scarring from zoster rash over the back of the right neck and the right anterior chest. No active lesions at this time, otherwise no new rashes. No open skin lesions or wounds.  NEUROLOGIC: Cranial nerves II through XII grossly intact. Strength and sensation are intact, nonfocal examination.  PSYCHIATRIC: The patient alert and oriented with good insight into her clinical condition. No signs of uncontrolled depression or anxiety.   LABORATORY DATA: Sodium 135, potassium 3.2, chloride 100, bicarbonate 23, BUN 20, creatinine 0.66 glucose 123. Lactic acid is 1.3. LFTs are normal. Troponin less than 0.03. White blood cells 14.3, hemoglobin 13.8, platelets 399,000, MCV is 80.   IMAGING:  Chest x-ray suspicious for widespread metastatic malignancy in the chest with large right pleural effusion causing compressive atelectasis of nearly the entire right lung and multitude of pulmonary nodules visualized in the left lung. Recommend further evaluation with CT of the chest.     CT angiography of the chest for PE is negative for pulmonary embolus consistent with extensive metastatic disease to the chest with a large right-sided malignant pleural effusion and enhancing pleural nodularity right supraclavicular and upper mediastinal adenopathy, and innumerable hematogenous lead distribution, pulmonary nodules throughout the left lung given the absence of definite primary lesion in the abdomen or pelvis, primary malignancy  likely arises in the chest, primary differential includes small cell lung cancer mesothelioma and malignant thymoma or thymic carcinoma. Recommend a further evaluation with diagnostic right plural effusion.    CT of the abdomen and pelvis shows upper retroperitoneal and upper abdominal lymphadenopathy consistent with nodal metastases, nonspecific 1.4 cm lesion in the left kidney may represent an additional focus of metastatic disease or a primary synchronous renal neoplasm, otherwise no evidence of intra-abdominal or pelvic metastases.   ASSESSMENT AND PLAN:  1.  Right pleural effusion and multiple nodularities throughout the left lung most likely malignant:  Have ordered a thoracentesis in the morning with fluid studies. We will also consult pulmonology as most of the right lung is collapsed and may have been for sometime with this pleural effusion. A thoracentesis will be diagnostic and therapeutic. We will consult oncology to follow for further treatment options for this patient.  2.  Shortness of breath: This is due to number one.  She is not hypoxic and not in respiratory distress at this time. Supplemental oxygen as needed. Blood cultures have been drawn in the Emergency Room.  3.  Hypokalemia replace and recheck in morning, also check magnesium.  4.  Hypertension. Blood pressure is well controlled at this time. We will continue her home medications, hydrochlorothiazide and lisinopril.  5.  Hyperlipidemia: Continue on statin.  6.  Prophylaxis: No pharmacological deep vein thrombosis prophylaxis, given plan for thoracentesis in the morning; no gastrointestinal prophylaxis  needed at this time.  TIME SPENT ON ADMISSION: 45 minutes.    ____________________________ Earleen Newport. Volanda Napoleon, MD cpw:at D: 11/30/2014 18:59:56 ET T: 11/30/2014 19:27:35 ET JOB#: [id number]  cc: Barnetta Chapel P. Volanda Napoleon, MD, <Dictator> Marguerita Merles, MD [place] SIGNED 12/01/2014 15:08

## 2015-01-17 NOTE — Discharge Summary (Signed)
PATIENT NAME:  Patricia Kelley, Patricia Kelley MR#:  [id number] DATE OF BIRTH:  28-Jul-1956  DATE OF ADMISSION:  12/29/2014 DATE OF DISCHARGE:  01/04/2015  ADMISSION DIAGNOSES:  1. Septic shock from underlying pneumonia and malignant pleural effusion.  2.  Hydropneumothorax.   DISCHARGE DIAGNOSES:  1.  Septic shock from empyema.  2.  Metastatic lung cancer stage IV.  3.  Brain metastases.  4.  Hypotension, resolved.  5.  Hyponatremia, resolved.  6.  Hyperlipidemia.   CONSULTATIONS: 1.  Timothy E. Genevive Bi, M.D.   2.  Cheral Marker. Ola Spurr, M.D. from infectious disease.   PHYSICAL EXAMINATION:  VITAL SIGNS AT DISCHARGE: Temperature 97.9, pulse 99, blood pressure 129/82, 94% on room air.  GENERAL: The patient is alert, oriented, not in acute distress.  LUNGS: Clear to auscultation with decreased breath sounds in the left side. No wheezing or rhonchi heard.  CARDIOVASCULAR: Regular rate and rhythm. No murmurs, gallops, or rubs.  ABDOMEN: Bowel sounds positive. Nontender, nondistended, no hepatosplenomegaly.   HOSPITAL COURSE:  This is a very pleasant 59 year old female with a past medical history of hypertension, hyperlipidemia, stage IV lung cancer who was admitted with infected right pleural catheter.  For further details, please refer to the H and P.  1.  Septic shock from empyema. The patient was initially placed in the CCU on pressors.  She was weaned off of the pressors.  Her blood pressure has remained stable.   2.  She has a right-sided pneumonia with an infected PleurX catheter.  The PleurX catheter actually fell out.  Her lung has now re-expanded. She does have some minor decreased breath sounds at discharge now. She did have some fevers before discharge. I did speak with Dr. Ola Spurr. He felt that she probably continues to have this empyema as the etiology of her fevers.  Since the patient was feeling well and wanted to be discharged and she was afebrile today, he thought it would be fine for her  to be discharged with close follow up with Dr. Candace Cruise.  More than likely she will need a pleural catheter placed back at some point. She is a PICC line.  Her cultures were growing out MRSA.  She is on vancomycin and home health has been arranged.  3.  Metastatic lung cancer stage IV.  Appreciate Dr. Ma Hillock seeing the patient in consultation. The patient will continue chemotherapy as per Dr. Ma Hillock. 4.  Brain metastasis. The patient was started on whole brain radiation. She finished 5 out of 10 cycle. She is scheduled today for radiation today the day of discharge. 5.  Hypotension from sepsis, resolved.  6.  Hyponatremia, which resolved.   DISCHARGE MEDICATIONS:  1.  Simvastatin 20 mg daily.  2.  Omeprazole 20 mg daily.  3.  Promethazine 25 mg every 6 hours p.r.n.  4.  Xanax 0.5 mg daily p.r.n.  5.  Cyclobenzaprine 5 mg t.i.d. p.r.n.  6.  One-A-Day Centrum vitamin.  7.  Acetaminophen/oxycodone 325/5 every 6 hours p.r.n.  8.  Vancomycin 1250 IV every 12 hours. 9.  Tussionex 5 mL every 12 p.r.n. cough.  10.  Benzonatate 100 mg every 4 hours p.r.n.  11.  Her dexamethasone has been on hold while she is in the hospital. She will discuss restarting the dexamethasone with Dr. Ma Hillock.   DISCHARGE DIET: Regular.   DISCHARGE ACTIVITY: As tolerated.   DISCHARGE OXYGEN:  Two liters nasal cannula.   DISCHARGE FOLLOWUP:  The patient has scheduled brain radiation today.  She is  going to follow up with Dr. Ma Hillock in 1 week, Dr. Genevive Bi in 1 week, and Dr. Ola Spurr in 2 weeks.   TIME SPENT: Approximately 35 minutes. The patient was stable for discharge.   ____________________________ Tyreck Bell P. Benjie Karvonen, MD spm:sp D: 01/04/2015 13:04:04 ET T: 01/04/2015 17:24:53 ET JOB#: [id number]  cc: Sonna Lipsky P. Benjie Karvonen, MD, <Dictator> Sandeep R. Ma Hillock, MD Lew Dawes Genevive Bi, MD Cheral Marker. Ola Spurr, MD Ulice Bold P Kristopher Attwood MD ELECTRONICALLY SIGNED 01/05/2015 13:42 Terica Yogi P Kynzli Rease MD ELECTRONICALLY SIGNED 01/05/2015 13:57

## 2015-01-17 NOTE — Consult Note (Signed)
General Aspect sepsis with hypotension and lack of appropriate IV access   Present Illness The patient is a 59 year old female with a known history of lung cancer, followed by Dr. Ma Hillock, with a large malignant pleural effusion on the right status PleurX catheter, followed by Dr. Genevive Bi, who is being admitted for an infected PleurX catheter. The patient was doing well until yesterday when she started noticing some drainage around her PleurX catheter. She was also having some chest pain and shortness of breath. The area around the PleurX catheter was somewhat red and tender to touch. She also noted some purulent drainage from the tube, so she called Dr. Ma Hillock, who requested her to come to the emergency department. While in the ED, she underwent CT scan of the chest which showed hydropneumothorax in the anterior right base as well as a smaller hydropneumothorax in the anterior right apex and extensive loculated effusion on the right with chest tube present and she is being admitted for further evaluation and management. She is alert but hypotensive and tachycardic and is requiring both parenteral antibiotics and pressors for life support.  She does not have adequate IV access and I am asked to evaluate.  PAST MEDICAL HISTORY: 1. History of lung cancer.  2. Shingles.  3. Large malignant, right-sided pleural effusion, likely empyema, status post chest tube, followed by Dr. Genevive Bi. Also has a hydropneumothorax.  4. Hypertension.   5. Hyperlipidemia.  6. Obesity.  7. Osteoarthritis.   PAST SURGICAL HISTORY: 1. Hysterectomy.  2. Breast lump removed.  3. Placement of PleurX catheter.   Home Medications: Medication Instructions Status  dexamethasone 4 mg oral tablet Take 1 tablet 4 times a day x 7 days, then take 1 tablet 3 times a day x 7 days, then take 1 tablet 2 times a day x 7 days, then take 1 tablet once daily x 7 days, and stop. Active  promethazine 25 mg oral tablet 1 tab(s) orally every 6 hours,  As Needed - for Nausea, Vomiting Active  simvastatin 20 mg oral tablet 1 tab(s) orally once a day (at bedtime) Active  hydrochlorothiazide-lisinopril 25 mg-20 mg oral tablet 1 tab(s) orally once a day (in the morning) Active  omeprazole 20 mg oral delayed release capsule 1 cap(s) orally once a day (in the morning) Active  ALPRAZolam 0.5 mg oral tablet 1 tab(s) orally once a day (at bedtime), As Needed - for Inability to Sleep Active  traMADol 50 mg oral tablet 1 tab(s) orally every 6 to 8 hours, As Needed - for Pain Active  cyclobenzaprine 5 mg oral tablet 1 tab(s) orally 3 times a day, As Needed for muscle spasms Active  One-A-Day Essentials Multiple Vitamins oral tablet 1 tab(s) orally once a day Active    No Known Allergies:   Case History:  Family History no porphyria, no autoimmune disease   Social History negative tobacco, negative ETOH, negative Illicit drugs   Review of Systems:  ROS No TIA/stroke/seizure No heat or cold intolerance No dysuria/hematuria No blurry or double vision No tinnitus or ear pain No rashes or ulcer No suicidal ideation or psychosis No signs of bleeding or easy bruising No SOB/DOE, orthopnea, or sputum No palpitations or chest pain No N/V/D or abdominal pain No joint pain or joint swelling No fever or chills No unintentional weight loss or gain   Physical Exam:  GEN well developed, obese, critically ill appearing   HEENT hearing intact to voice, moist oral mucosa   NECK supple  trachea midline   RESP postive use of accessory muscles  tachypnea, right sided chest tube   CARD No LE edema  no JVD   ABD denies tenderness  soft   EXTR negative cyanosis/clubbing, negative edema   SKIN No rashes, No ulcers, skin turgor decreased   NEURO cranial nerves intact, follows commands, motor/sensory function intact   PSYCH alert, A+O to time, place, person   Nursing/Ancillary Notes: **Vital Signs.:   12-Apr-16 10:35  Vital Signs Type Admission   Temperature Temperature (F) 98.3  Celsius 36.8  Temperature Source oral  Pulse Pulse 119  Respirations Respirations 19  Systolic BP Systolic BP 71  Diastolic BP (mmHg) Diastolic BP (mmHg) 48  Mean BP 55  Pulse Ox % Pulse Ox % 94  Pulse Ox Activity Level  At rest  Oxygen Delivery 2L; Nasal Cannula   Hepatic:  12-Apr-16 06:37   Bilirubin, Total  1.7 (0.3-1.2 NOTE: New Reference Range  11/24/14)  Alkaline Phosphatase 77 (38-126 NOTE: New Reference Range  11/24/14)  SGPT (ALT)  57 (14-54 NOTE: New Reference Range  11/24/14)  SGOT (AST) 26 (15-41 NOTE: New Reference Range  11/24/14)  Total Protein, Serum 6.8 (6.5-8.1 NOTE: New Reference Range  11/24/14)  Albumin, Serum  3.0 (3.5-5.0 NOTE: New reference range  11/24/14)  Routine Micro:  12-Apr-16 06:37   Micro Text Report HIV 1/2 AG AB COMBO   HIV 1/2 ANTIBODIES        NON-REACTIVE ANTIBODY   HIV-1 p24 ANTIGEN         NON-REACTIVE ANTIGEN   INTERPRETATION            NONREACTIVE.  A NONREACTIVE test result means that HIV-1 or HIV-2 antibodies and HIV-1 p24 antigen were not detected in the specimen.   ANTIBIOTIC                       Micro Text Report BLOOD CULTURE   COMMENT                   NO GROWTH IN 18-24 HOURS   ANTIBIOTIC                       Culture Comment NO GROWTH IN 18-24 HOURS  Result(s) reported on 30 Dec 2014 at 07:00AM.    06:50   Micro Text Report BLOOD CULTURE   COMMENT                   NO GROWTH IN 18-24 HOURS   ANTIBIOTIC                       Culture Comment NO GROWTH IN 18-24 HOURS  Result(s) reported on 30 Dec 2014 at 07:00AM.    15:16   Micro Text Report BODY FLUID CULTURE   COMMENT                   HOLDING FOR POSSIBLE PATHOGEN   ANTIBIOTIC                       Specimen Source PLEURAL  Culture Comment HOLDING FOR POSSIBLE PATHOGEN  Result(s) reported on 30 Dec 2014 at 09:46AM.  Cardiology:  12-Apr-16 06:22   Ventricular Rate 118  Atrial Rate 118  P-R Interval 118  QRS  Duration 64  QT 286  QTc 400  P Axis 67  R Axis 61  T Axis 23  ECG interpretation Sinus tachycardia Otherwise normal ECG When compared with ECG of 30-Nov-2014 12:54, No significant change was found ----------unconfirmed---------- Confirmed by OVERREAD, NOT ([id number]), editor PEARSON, BARBARA ([id number]) on 12/30/2014 9:45:04 AM  Routine Chem:  12-Apr-16 06:37   Glucose, Serum  118 (65-99 NOTE: New Reference Range  11/24/14)  BUN  30 (6-20 NOTE: New Reference Range  11/24/14)  Creatinine (comp) 0.78 (0.44-1.00 NOTE: New Reference Range  11/24/14)  Sodium, Serum  133 (135-145 NOTE: New Reference Range  11/24/14)  Potassium, Serum 4.3 (3.5-5.1 NOTE: New Reference Range  11/24/14)  Chloride, Serum  100 (101-111 NOTE: New Reference Range  11/24/14)  CO2, Serum 22 (22-32 NOTE: New Reference Range  11/24/14)  Calcium (Total), Serum  8.5 (8.9-10.3 NOTE: New Reference Range  11/24/14)  eGFR (African American) >60  eGFR (Non-African American) >60 (eGFR values <32m/min/1.73 m2 may be an indication of chronic kidney disease (CKD). Calculated eGFR is useful in patients with stable renal function. The eGFR calculation will not be reliable in acutely ill patients when serum creatinine is changing rapidly. It is not useful in patients on dialysis. The eGFR calculation may not be applicable to patients at the low and high extremes of body sizes, pregnant women, and vegetarians.)  Anion Gap 11  Result Comment LACTIC ACID - VERIFIED IN ERROR  Result(s) reported on 29 Dec 2014 at 07:41AM.  Lactic Acid  Venous - (0.5-2.0 NOTE: New Reference Range:  11/24/14)    07:27   Result Comment - LACTIC ACID CALLED TO COLLYN GILLISPIE AT  - [id number]  - RESULTS VERIFIED BY REPEAT TESTING  - NOTIFIED OF CRITICAL / READ-BACK PERFORMED  Result(s) reported on 29 Dec 2014 at 08:26AM.  Lactic Acid  Venous  2.6 (0.5-2.0 NOTE: New Reference Range:  11/24/14)    18:35   Result Comment - LACTATE  -  RESULTS VERIFIED BY REPEAT TESTING  - NOTIFIED OF CRITICAL SPOKE WITH TESS  - [place] 1923 [place] PERFORMED  Result(s) reported on 29 Dec 2014 at 07:23PM.  Lactic Acid  Venous  2.4 (0.5-2.0 NOTE: New Reference Range:  11/24/14)  Cardiac:  12-Apr-16 06:37   Troponin I <0.03 (0.00-0.03 0.03 ng/mL or less: NEGATIVE  Repeat testing in 3-6 hrs  if clinically indicated. >0.05 ng/mL: POTENTIAL  MYOCARDIAL INJURY. Repeat  testing in 3-6 hrs if  clinically indicated. NOTE: An increase or decrease  of 30% or more on serial  testing suggests a  clinically important change NOTE: New Reference Range  11/24/14)  Routine UA:  12-Apr-16 14:31   Color (UA) Amber  Clarity (UA) Hazy  Glucose (UA) Negative  Bilirubin (UA) Negative  Ketones (UA) Negative  Specific Gravity (UA) >1.060  Blood (UA) 1+  pH (UA) 5.0  Protein (UA) Negative  Nitrite (UA) Negative  Leukocyte Esterase (UA) 3+ (Result(s) reported on 29 Dec 2014 at 02:47PM.)  RBC (UA) 6-30  WBC (UA) 6-30  Bacteria (UA) RARE  Epithelial Cells (UA) 6-30 (Result(s) reported on 29 Dec 2014 at 02:47PM.)  Routine Sero:  12-Apr-16 06:37   - HIV 1/2 Antibodies NON-REACTIVE ANTIBODY  - HIV-1 p24 Antigen NON-REACTIVE ANTIGEN  - Interpretation NONREACTIVE.  A NONREACTIVE test result means that HIV-1 or HIV-2 antibodies and HIV-1 p24 antigen were not detected in the specimen.  Result(s) reported on 29 Dec 2014 at 02:32PM.  Routine Coag:  12-Apr-16 10:01   Prothrombin 13.9 (11.4-15.0 NOTE: New Reference Range  10/16/14)  INR 1.1 (INR reference interval applies to patients  on anticoagulant therapy. A single INR therapeutic range for coumarins is not optimal for all indications; however, the suggested range for most indications is 2.0 - 3.0. Exceptions to the INR Reference Range may include: Prosthetic heart valves, acute myocardial infarction, prevention of myocardial infarction, and combinations of aspirin and  anticoagulant. The need for a higher or lower target INR must be assessed individually. Reference: The Pharmacology and Management of the Vitamin K  antagonists: the seventh ACCP Conference on Antithrombotic and Thrombolytic Therapy. KGURK.2706 Sept:126 (3suppl): [id number]. A HCT value >55% may artifactually increase the PT.  In one study,  the increase was an average of 25%. Reference:  "Effect on Routine and Special Coagulation Testing Values of Citrate Anticoagulant Adjustment in Patients with High HCT Values." American Journal of Clinical Pathology 2006;126:400-405.)  Activated PTT (APTT) 28.8 (A HCT value >55% may artifactually increase the APTT. In one study, the increase was an average of 19%. Reference: "Effect on Routine and Special Coagulation Testing Values of Citrate Anticoagulant Adjustment in Patients with High HCT Values." American Journal of Clinical Pathology 2006;126:400-405.)  Routine Hem:  12-Apr-16 06:37   WBC (CBC)  3.0  RBC (CBC) 4.19  Hemoglobin (CBC)  11.0  Hematocrit (CBC)  33.8  Platelet Count (CBC) 158 (Result(s) reported on 29 Dec 2014 at 07:33AM.)  MCV 81  MCH 26.2  MCHC 32.4  RDW 13.9  Neutrophil % 59.1  Lymphocyte % 29.3  Monocyte % 10.0  Eosinophil % 1.4  Basophil % 0.2  Neutrophil # 1.8  Lymphocyte #  0.9  Monocyte # 0.3  Eosinophil # 0.0  Basophil # 0.0  Reference Accession# [id number] (Result(s) reported on 29 Dec 2014 at 10:50AM.)    Impression 1. Septic shock likely from underlying pneumonia and malignant pleural effusion. We will start her on IV vancomycin and Zosyn at this time. Certainly this could be PleurX catheter infection. Also will get and fluid sent out for culture and also obtain blood and sputum cultures. We will consult infectious disease doctor, Dr. Ola Spurr and we will start her on Levophed and start on sepsis protocol with aggressive hydration. She does not have adequate IV access and givne the chest tube on the right I will  place a right IJ TLC. 2. Hydropneumothorax. We will consult Dr. Genevive Bi. I am not sure what could be a treatment option at this point. She seemed to have significant empyema with infected catheter. For now, we will leave it alone and provide local wound care and await Dr. Genevive Bi??? input. Continue broad spectrum antibiotic at this time.  3. Hypotension, likely from sepsis. We will hold her blood pressure medications and provide Levophed along with IV hydration.  4. Hyponatremia, likely due to dehydration. We will monitor her sodium very closely while in the hospital with IV fluids.   Plan level 3   Electronic Signatures: Hortencia Pilar (MD)  (Signed 13-Apr-16 13:48)  Authored: General Aspect/Present Illness, Home Medications, Allergies, History and Physical Exam, Vital Signs, Labs, Impression/Plan   Last Updated: 13-Apr-16 13:48 by Hortencia Pilar (MD)

## 2015-01-17 NOTE — Consult Note (Addendum)
PATIENT NAME:  MURNA, BACKER MR#:  [id number] DATE OF BIRTH:  [birth year]  DATE OF CONSULTATION:  12/29/2014  REQUESTING PHYSICIAN:  Manuella Ghazi   REASON FOR CONSULTATION: Infected Pleurx catheter.   HISTORY OF PRESENT ILLNESS: This is a very pleasant, but unfortunate 60 year old female recently diagnosed in March of this year with lung cancer after an episode of shingles. She had to have placement of a Pleurx catheter by Dr. Genevive Bi for a malignant pleural effusion. She has been following with Dr. Ma Hillock and has received chemotherapy. She reports that she was self draining her catheter at home daily as instructed. She says she was tolerating the chemotherapy and actually walking around and doing relatively well when over the last few days, she developed pain at the Pleurx catheter site, as well as chest pain. She was admitted 12/29/2014 and found to have purulent drainage from her catheter. She has been seen by Dr. Genevive Bi. CT scan continues to show hydropneumothorax. She has been started on vancomycin and Zosyn.   PAST MEDICAL HISTORY:  1. Lung cancer diagnosed in March of 2016 when she presented with an episode of shingles and shortness of breath. This presented with a large, right-sided pleural effusion.  2. Hypertension.  3. Hyperlipidemia.  4. Obesity.  5. Osteoarthritis.   PAST SURGICAL HISTORY: Hysterectomy, breast lump removal, placement of Pleurx catheter as above.   SOCIAL HISTORY: Lives with her husband. Former smoker, having quit just about 2 months ago. She drinks alcohol socially. 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   FAMILY HISTORY: Noncontributory.   ALLERGIES: NO KNOWN DRUG ALLERGIES.   ANTIBIOTICS SINCE ADMISSION: Include vancomycin and Zosyn.   REVIEW OF SYSTEMS: Eleven systems reviewed and negative except per history of present illness.   PHYSICAL EXAMINATION:  VITAL SIGNS: Temperature 98.3, pulse 119, blood  pressure 78/60, respirations 20, saturation 95% on 2 liters.  GENERAL: She is ill-appearing.  HEENT: Pupils are reactive. Sclerae anicteric. Oropharynx clear.  NECK: Supple.  HEART: Regular.  LUNGS: She had decreased breath sounds on the right. She has a Pleurx catheter in. The insertion site is quite tender and indurated. She has purulent drainage in the water seal.  NEUROLOGIC: She is alert and oriented x3. Grossly nonfocal.  SKIN: No rash or lesions.   LABORATORY DATA: White blood count 3.0, hemoglobin 11.0, platelets 158. Lymphocyte count is somewhat low at 0.9, neutrophil 59.1%. INR is normal. LFTs showed mildly elevated total bilirubin at 1.7 and mildly elevated ALT at 57. Renal function shows a creatinine of 0.78. BUN is elevated at 30. Lactic acid elevated at 2.6.   IMAGING: CT angiogram of the chest shows chest tube in the right with the tip in the apex in an area of focal hydropneumothorax. There is a loculated effusion on the right. There is air within the fluid collection anteriorly in the base of the region, consistent with a right basilar hydropneumothorax, not causing tension. There is airspace consolidation in the right lower lobe. There is widespread parenchymal lung metastatic disease, stable compared to prior PET. There are areas of irregular pleural thickness on the right, consistent with pleural metastases. There are multiple small lymph nodes throughout the mediastinum and right axillary region.   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 She comes in  now with obvious purulence from the pleural space, as well as induration and marked tenderness at the site of insertion. CT continues to show hydropneumothorax. She has been seen by Dr. Genevive Bi. The catheter  drainage with water seal.   PLAN: Repeat a CT in  the morning.   RECOMMENDATIONS:  1. Would suggest sending a sample for culture, which I have asked the nurse to do.  2. Check HIV test if this does not been done.  3. Continue vancomycin and Zosyn pending further culture results. This is, most likely, I think Staphylococcus aureus.   Thank you for the consult. I will be glad to follow with you.  ____________________________ Cheral Marker. Ola Spurr, MD dpf:AT D: 12/29/2014 13:54:49 ET T: 12/29/2014 15:37:00 ET JOB#: [id number]  cc: Cheral Marker. Ola Spurr, MD, <Dictator> Sophya Vanblarcom Ola Spurr MD ELECTRONICALLY SIGNED 01/19/2015 10:19

## 2015-01-17 NOTE — Op Note (Signed)
PATIENT NAME:  Patricia Kelley, Patricia Kelley MR#:  [id number] DATE OF BIRTH:  May 11, 1956  DATE OF PROCEDURE:  12/29/2014  PREOPERATIVE DIAGNOSES: 1.  Sepsis.  2.  Lung carcinoma.   POSTOPERATIVE DIAGNOSES:  1.  Sepsis.  2.  Lung carcinoma.   PROCEDURE PERFORMED: Insertion of right internal jugular triple-lumen catheter.   SURGEON:   Katha Cabal, MD  DESCRIPTION OF PROCEDURE: The patient is in the intensive care unit. She is critically ill. She is positioned supine and the right neck is prepped and draped in a sterile fashion. Ultrasound is placed in a sterile sleeve and the jugular vein is imaged. It is echolucent and compressible indicating patency. Image is recorded for the permanent record. Under real-time visualization, a microneedle is inserted, microwire followed by micro sheath, J-wire followed by the dilator and then the triple-lumen catheter. All lumens aspirate and flush easily. The catheter is secured to the skin of the neck with 2-0 silk. Sterile dressing with a Biopatch is applied. The patient tolerated the procedure well and there were no immediate complications.    ____________________________ Katha Cabal, MD ggs:LT D: 12/30/2014 17:53:27 ET T: 12/30/2014 22:50:47 ET JOB#: [id number]  cc: Katha Cabal, MD, <Dictator> Katha Cabal MD ELECTRONICALLY SIGNED 01/05/2015 12:56

## 2015-01-17 NOTE — H&P (Signed)
PATIENT NAME:  Patricia Kelley, Patricia Kelley MR#:  [id number] DATE OF BIRTH:  [birth year]  DATE OF ADMISSION:  12/29/2014  PRIMARY CARE PHYSICIAN: Patricia Merles, MD.  ONCOLOGIST: Patricia Kelley. Patricia Hillock, MD.  REQUESTING PHYSICIAN: Patricia Drafts, MD.   CHIEF COMPLAINT: Chest pain and shortness of breath.   HISTORY OF PRESENT ILLNESS: The patient is a 59 year old female with a known history of lung cancer, followed by Patricia Kelley, with a large malignant pleural effusion on the right status PleurX catheter, followed by Patricia [facility], who is being admitted for an infected PleurX catheter. The patient was doing well until yesterday when she started noticing some drainage around her PleurX catheter. She was also having some chest pain and shortness of breath. The area around the PleurX catheter was somewhat red and tender to touch. She also noted some purulent drainage from the tube, so she called Patricia Kelley, who requested her to come to the emergency department. While in the ED, she underwent CT scan of the chest which showed hydropneumothorax in the anterior right base as well as a smaller hydropneumothorax in the anterior right apex and extensive loculated effusion on the right with chest tube present and she is being admitted for further evaluation and management.   PAST MEDICAL HISTORY: 1. History of lung cancer.  2. Shingles.  3. Large malignant, right-sided pleural effusion, likely empyema, status post chest tube, followed by [facility]. Also has a hydropneumothorax.  4. Hypertension.   5. Hyperlipidemia.  6. Obesity.  7. Osteoarthritis.   PAST SURGICAL HISTORY: 1. Hysterectomy.  2. Breast lump removed.  3. Placement of PleurX catheter.   SOCIAL HISTORY: She lives with her husband. She is a former smoker, quit about 2 months ago. Occasional alcohol. 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   FAMILY HISTORY: Positive for coronary artery  disease, hypertension and rheumatoid arthritis. Also has uterine cancer in her mother and ovarian cancer in her grandmother.   MEDICATIONS AT HOME: 1. Alprazolam 0.5 mg p.o. at bedtime as needed.  2. Cyclobenzaprine 5 mg p.o. 3 times a day as needed.  3. Dexamethasone taper.  4. Hydrochlorothiazide/lisinopril 25/20 mg 1 tablet p.o. daily.  5. Omeprazole 20 mg p.o. daily.  6. One-A-Day vitamin once daily.  7. Promethazine 25 mg p.o. every 6 hours as needed.  8. Simvastatin 20 mg p.o. at bedtime.  9. Tramadol 50 mg p.o. every 6 to 8 hours as needed.   REVIEW OF SYSTEMS:  CONSTITUTIONAL: No fever. Positive for fatigue and weakness.  EYES: No blurred or double vision.  ENT: No tinnitus or ear pain.  RESPIRATORY: No cough, but positive for shortness of breath and chest pain.  CARDIOVASCULAR: No palpitation. No syncope. Positive for chest pain and shortness of breath.  GASTROINTESTINAL: No nausea, vomiting, diarrhea.  GENITOURINARY: No dysuria, hematuria.   ENDOCRINE: No polyuria.   HEMATOLOGY: No anemia or easy bruising.  SKIN: She has had a history of shingles. Occasional herpetic neuralgia over the right shoulder. No new rashes or changes. She has minimal erythema around her PleurX catheter exit.  MUSCULOSKELETAL: No joint pain or arthritis.  NEUROLOGIC: No tingling, numbness, weakness.  PSYCHIATRY: No history of anxiety or depression.   PHYSICAL EXAMINATION: VITAL SIGNS: Temperature 99.9, heart rate 117 per minute, respirations 26 per minute, blood pressure  100/80. She is saturating 96% on room air.  GENERAL: The patient is a 59 year old female, lying in the  bed, critically sick.  EYES: Pupils equal, round, reactive to light and accommodation. No scleral icterus. Extraocular muscles intact.  HEENT: Head atraumatic, normocephalic. Oropharynx and nasopharynx clear.  NECK: Supple. No jugular venous distention.  LUNGS: Decreased breath sounds on the right. She has tenderness around her  chest tube site. No rales or rhonchi.  CARDIOVASCULAR: S1, S2 normal, tachycardic. No murmurs, rubs or gallop.  ABDOMEN: Soft, nontender, nondistended. Bowel sounds present. No organomegaly, masses.  EXTREMITIES: No pedal edema, cyanosis, clubbing.  NEUROLOGIC: Cranial nerves 2 through 12 intact. Muscle strength 5/5 in extremities. Sensation intact.  PSYCHIATRIC: The patient is alert and oriented x 3.  SKIN: Around her right lateral chest wall, she has necrotic debris with some minimal redness and purulent discharge around the catheter site.  MUSCULOSKELETAL: No joint effusion or tenderness.   LABORATORY DATA: Normal BMP except sodium of 133, chloride of 100. Lactic acid was 2.6. Normal liver function tests except ALT of 57. Normal troponin. Normal CBC except hemoglobin of 11, hematocrit 33.8. Normal coagulation panel. Blood culture x 1 was negative. UA shows 3+ glucose, 6 to 30 WBCs and rare bacteria.   DIAGNOSTIC DATA:  1. Chest x-ray in the emergency department showed mild central pulmonary vascular congestion. Possible pneumonia and moderate pleural effusion on the right.  2. CT scan of the chest shows no PE. Hydropneumothorax in the anterior right base as well as a small hydropneumothorax in the anterior right apex. Extensive loculated effusion on the right with chest tube present. Right lower lobe airspace consolidation. Widespread metastatic disease.   IMPRESSION AND PLAN: 1. Septic shock likely from underlying pneumonia and malignant pleural effusion. We will start her on IV vancomycin and Zosyn at this time. Certainly this could be PleurX catheter infection. Also will get and fluid sent out for culture and also obtain blood and sputum cultures. We will consult infectious disease doctor, Dr. Ola Spurr and we will start her on Levophed and start on sepsis protocol with aggressive hydration. We will also get a central line. The case was discussed with Dr. Delana Meyer, who was gracious enough to  perform a central line on this patient.  2. Hydropneumothorax. We will consult [facility]. I am not sure what could be a treatment option at this point. She seemed to have significant empyema with infected catheter. For now, we will leave it alone and provide local wound care and await [facility]' input. Continue broad spectrum antibiotic at this time.  3. Hypotension, likely from sepsis. We will hold her blood pressure medications and provide Levophed along with IV hydration.  4. Hyponatremia, likely due to dehydration. We will monitor her sodium very closely while in the hospital with IV fluids.  5. CODE STATUS: Full code.   Total time taking care of this patient (critical care): 55 minutes. She is critically sick with septic shock.    ____________________________ Krosby Ritchie S. Manuella Ghazi, MD vss:TT D: 12/29/2014 17:03:53 ET T: 12/29/2014 18:22:01 ET JOB#: [id number]  cc: Tirzah Fross S. Manuella Ghazi, MD, <Dictator> Patricia Merles, MD Sandeep R. Patricia Hillock, MD Cheral Marker. Ola Spurr, MD [place] Patricia Bi, MD [place] MD ELECTRONICALLY SIGNED 12/31/2014 10:42

## 2015-01-17 NOTE — Consult Note (Signed)
ONCOLOGY follow-up note - still has dyspnea on exertion and cough, on [place] oxygen, but feels much better overall. States that she ambulated short distances and felt okay.low-grade temperature. Denies chills. Eating better. No bleeding issues.Resting in bed, alert and oriented, on nasal cannula oxygen, no acute distress          Vitals - 100.4, 110, 20, 116/75, 98% on 2 L oxygen          Lungs - decreased breath sounds right lower lobe, otherwise no rhonchi          Abdomen - soft, nontender.  - 4/13 - hemoglobin 10.3, platelets 164, WBC 6800, 78% neutrophils. Pleural fluid culture growing MRSA. Blood culture negative.  12/30/14 - CT chest. IMPRESSION:  1. Decreased right hydropneumothorax with removal of right pleural catheter. Small residual subpulmonic fluid component persists with scattered foci of extrapleural air. Some improved aeration of the right lower lobe,, however diffuse lower lobe and perihilar right middle lobe consolidation persists. It is difficult to differentiate malignancy from atelectasis or pneumonia in the setting, however patient has had recent PET which better characterized this.  2. Diffuse pleural thickening and nodularity in the right hemithorax. Diffuse pulmonary metastatic disease.  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 Also on palliative brain radiation for brain metastasis. The patient got admitted with MRSA infection right pleural cavity with empyema. Currently clinically feels much better, only low-grade temperature. Blood cultures negative. Agree with ongoing antibiotic coverage, supportive treatment. Recent CBC from April 13 did not show neutropenia or thrombocytopenia.oncology standpoint, plan is to resume brain radiation starting Monday, April 18. Will delay  chemotherapy next week (cycle 2 was due on April 18) and see her back on April 25 with labs and plan to resume chemotherapy if she is doing better from infection standpoint and clinical standpoint. Agree with holding off on Decadron at this time, she does not have major headaches or other neurological symptoms. If patient gets discharged this weekend, she will be contacted with appointment to come see radiation on Monday. The patient is agreeable to this plan.     Electronic Signatures: Jonn Shingles (MD)  (Signed on 15-Apr-16 14:02)  Authored  Last Updated: 15-Apr-16 14:02 by Jonn Shingles (MD)

## 2015-01-17 NOTE — Consult Note (Signed)
PATIENT NAME:  Patricia Kelley, Patricia Kelley MR#:  [id number] DATE OF BIRTH:  1956/08/18  DATE OF CONSULTATION:  12/01/2014  REFERRING PHYSICIAN:  Barnetta Chapel P. Volanda Napoleon, MD   CONSULTING PHYSICIAN:  Conchita Truxillo R. Ma Hillock, MD  REASON FOR CONSULTATION: Multiple left lung nodules, large right pleural effusion, adenopathy suspicious for metastatic malignancy.   HISTORY OF PRESENT ILLNESS: The patient is a 59 year old African American female with past medical history significant for hyperlipidemia, hypertension, osteoarthritis, breast lump removal in [place] about 10 years ago reportedly benign, partial hysterectomy 20 years ago, denies malignancy, who has been admitted to the hospital with progressive severe shortness of breath on minimal activity along with weakness. The patient states that she developed episode of shingles over the left neck and left side of the chest a few weeks ago and since then has been slowly having more weakness.  She had chest x-ray, which showed large right-sided effusion and a CT scan, which showed multiple left lung nodules, massive right pleural effusion, upper abdominal and retroperitoneal lymphadenopathy and right supraclavicular adenopathy all suspicious for metastatic malignancy. She has had 1.7 liters right thoracentesis done by radiology today and cytology is pending. Clinically states that she is still weak and dyspneic on exertion but feels better since having some fluid removed. No new chest pain. Denies any known history of prior malignancy.   PAST MEDICAL HISTORY AND SURGICAL HISTORY: As in HPI above.   FAMILY HISTORY: Mother had cervical cancer and grandmother had uterine cancer. Denies other malignancy.   SOCIAL HISTORY: Ex-smoker, quit about a month ago but less than 1 pack per week. Social alcohol intake only about 6 packs on the weekends. Denies recreational drug usage. Works at [employer]. Lives with her husband.   ALLERGIES: No known drug allergies.   HOME  MEDICATIONS: Amlodipine 5 mg once daily, amoxicillin 500 mg t.i.d., Cheratussin cough syrup q. 4 hours p.r.n., cyclobenzaprine 5 mg t.i.d. p.r.n., hydrochlorothiazide/lisinopril 25/20 mg once daily, multivitamin once daily, omeprazole 20 mg delayed release once daily, simvastatin 20 mg once daily, tramadol 50 mg q. 6 hours p.r.n.   REVIEW OF SYSTEMS:  CONSTITUTIONAL: As in HPI. No fevers or chills. No night sweats.  HEENT: Intermittent dizziness. No headaches, epistaxis, ear or jaw pain.  CARDIAC: No angina, palpitation, orthopnea, or PND.  LUNGS: As in HPI.  GASTROINTESTINAL: No nausea, vomiting, or diarrhea. No new constipation or change in caliber of stool. No bright red blood in stools or melena.  GENITOURINARY: No dysuria or hematuria.  MUSCULOSKELETAL: As in HPI.  SKIN: No new rashes or pruritus.  HEMATOLOGIC: Denies bleeding symptoms.  NEUROLOGIC: No new focal weakness, seizures, or loss of consciousness.  ENDOCRINE: No polyuria or polydipsia. Appetite fairly steady.   PHYSICAL EXAMINATION:  GENERAL: The patient is a moderately built and nourished individual, resting in bed, alert and oriented, converses appropriately. No icterus.  VITAL SIGNS: 98.4, 99, 20, 131/80, 95% on 2 liters.  HEENT: Normocephalic, atraumatic. Extraocular movements intact. Sclerae anicteric.  NECK: Negative for lymphadenopathy.  CARDIOVASCULAR: S1, S2, regular rate and rhythm.  LUNGS: Decreased breath sounds on the right side, a few crepitations, no rhonchi.  ABDOMEN: Soft. No hepatosplenomegaly or masses palpable clinically.  EXTREMITIES: No major edema or cyanosis.  SKIN: No generalized rashes or major bruising.  MUSCULOSKELETAL: No obvious joint redness or swelling.  LYMPHATICS: No axillary or inguinal adenopathy.   LABORATORY RESULTS: Blood culture negative so far, creatinine 0.57, sodium 138, potassium 3.7, calcium 8.5, WBC 12,800, hemoglobin 12.3, platelets 351,  [place] 8800.   IMPRESSION AND  RECOMMENDATIONS: A 59 year old African American female with past medical history as described above admitted with progressive respiratory symptoms and found to have an extremely large right pleural effusion, multiple left lung nodules, mediastinal adenopathy, upper abdominal and retroperitoneal lymphadenopathy and right supraclavicular lymph node, all raising suspicion for possible metastatic malignancy versus other etiology. Plan is to wait for cytology report of thoracentesis to see if it will give [place] a tissue diagnosis, if it is negative, then could consider getting CT-guided supraclavicular lymph node biopsy. Have independently reviewed CT scan and discussed with patient (and husband present at bedside) and explained that if she does have multiple areas of malignancy, then it could represent stage IV disease, which is incurable and treatments offered would be of palliative intent only. I also explained that she might need further workup based upon type of malignancy or if no diagnosis obtained for pleural fluid. The patient does not have any pain issues at this time. We will make further recommendations after pleural fluid cytology report is seen. Will continue to follow.    Thank you for the referral. Please feel free to contact me for additional questions.     ____________________________ Rhett Bannister Ma Hillock, MD srp:AT D: 12/02/2014 00:55:02 ET T: 12/02/2014 02:08:38 ET JOB#: [id number]  cc: [place] Tuck[place] R. Ma Hillock, MD, <Dictator> Alveta Heimlich MD ELECTRONICALLY SIGNED 12/02/2014 9:12

## 2015-01-18 ENCOUNTER — Inpatient Hospital Stay: Payer: 59 | Attending: Internal Medicine

## 2015-01-18 DIAGNOSIS — C7931 Secondary malignant neoplasm of brain: Secondary | ICD-10-CM | POA: Diagnosis not present

## 2015-01-18 DIAGNOSIS — C3411 Malignant neoplasm of upper lobe, right bronchus or lung: Secondary | ICD-10-CM | POA: Insufficient documentation

## 2015-01-18 DIAGNOSIS — J910 Malignant pleural effusion: Secondary | ICD-10-CM | POA: Insufficient documentation

## 2015-01-18 DIAGNOSIS — I10 Essential (primary) hypertension: Secondary | ICD-10-CM | POA: Insufficient documentation

## 2015-01-18 DIAGNOSIS — Z79899 Other long term (current) drug therapy: Secondary | ICD-10-CM | POA: Diagnosis not present

## 2015-01-18 DIAGNOSIS — Z5111 Encounter for antineoplastic chemotherapy: Secondary | ICD-10-CM | POA: Diagnosis not present

## 2015-01-18 DIAGNOSIS — Z87891 Personal history of nicotine dependence: Secondary | ICD-10-CM | POA: Insufficient documentation

## 2015-01-18 DIAGNOSIS — D630 Anemia in neoplastic disease: Secondary | ICD-10-CM | POA: Diagnosis not present

## 2015-01-18 DIAGNOSIS — G8929 Other chronic pain: Secondary | ICD-10-CM | POA: Insufficient documentation

## 2015-01-18 DIAGNOSIS — C3491 Malignant neoplasm of unspecified part of right bronchus or lung: Secondary | ICD-10-CM

## 2015-01-18 DIAGNOSIS — E780 Pure hypercholesterolemia: Secondary | ICD-10-CM | POA: Insufficient documentation

## 2015-01-18 LAB — CBC
HEMATOCRIT: 35.9 % (ref 35.0–47.0)
Hemoglobin: 12.4 g/dL (ref 12.0–16.0)
MCH: 30.7 pg (ref 26.0–34.0)
MCHC: 34.6 g/dL (ref 32.0–36.0)
MCV: 88.8 fL (ref 80.0–100.0)
PLATELETS: 264 10*3/uL (ref 150–440)
RBC: 4.04 MIL/uL (ref 3.80–5.20)
RDW: 13.6 % (ref 11.5–14.5)
WBC: 8.2 10*3/uL (ref 3.6–11.0)

## 2015-01-20 ENCOUNTER — Telehealth: Payer: Self-pay

## 2015-01-20 NOTE — Telephone Encounter (Signed)
Request from [place] , sent to [facility] on 01/20/2015 .

## 2015-01-21 ENCOUNTER — Encounter: Payer: Self-pay | Admitting: Family Medicine

## 2015-01-21 ENCOUNTER — Inpatient Hospital Stay: Payer: 59

## 2015-01-21 ENCOUNTER — Inpatient Hospital Stay (HOSPITAL_BASED_OUTPATIENT_CLINIC_OR_DEPARTMENT_OTHER): Payer: 59 | Admitting: Family Medicine

## 2015-01-21 VITALS — BP 128/78 | HR 119 | Temp 97.8°F | Ht 64.0 in | Wt 177.0 lb

## 2015-01-21 VITALS — BP 114/76 | HR 102 | Temp 97.6°F | Resp 20

## 2015-01-21 DIAGNOSIS — D630 Anemia in neoplastic disease: Secondary | ICD-10-CM

## 2015-01-21 DIAGNOSIS — E780 Pure hypercholesterolemia: Secondary | ICD-10-CM

## 2015-01-21 DIAGNOSIS — R5383 Other fatigue: Secondary | ICD-10-CM

## 2015-01-21 DIAGNOSIS — Z87891 Personal history of nicotine dependence: Secondary | ICD-10-CM

## 2015-01-21 DIAGNOSIS — C3490 Malignant neoplasm of unspecified part of unspecified bronchus or lung: Secondary | ICD-10-CM

## 2015-01-21 DIAGNOSIS — R5381 Other malaise: Secondary | ICD-10-CM

## 2015-01-21 DIAGNOSIS — Z5111 Encounter for antineoplastic chemotherapy: Secondary | ICD-10-CM | POA: Diagnosis not present

## 2015-01-21 DIAGNOSIS — C3491 Malignant neoplasm of unspecified part of right bronchus or lung: Secondary | ICD-10-CM

## 2015-01-21 DIAGNOSIS — C799 Secondary malignant neoplasm of unspecified site: Secondary | ICD-10-CM | POA: Diagnosis not present

## 2015-01-21 DIAGNOSIS — I10 Essential (primary) hypertension: Secondary | ICD-10-CM

## 2015-01-21 DIAGNOSIS — D649 Anemia, unspecified: Secondary | ICD-10-CM

## 2015-01-21 DIAGNOSIS — Z79899 Other long term (current) drug therapy: Secondary | ICD-10-CM

## 2015-01-21 DIAGNOSIS — R531 Weakness: Secondary | ICD-10-CM

## 2015-01-21 HISTORY — DX: Anemia, unspecified: D64.9

## 2015-01-21 HISTORY — DX: Malignant neoplasm of unspecified part of unspecified bronchus or lung: C34.90

## 2015-01-21 HISTORY — DX: Anemia in neoplastic disease: D63.0

## 2015-01-21 LAB — HEPATIC FUNCTION PANEL
ALK PHOS: 64 U/L (ref 38–126)
ALT: 30 U/L (ref 14–54)
AST: 26 U/L (ref 15–41)
Albumin: 2.1 g/dL — ABNORMAL LOW (ref 3.5–5.0)
Bilirubin, Direct: <0.1 mg/dL — ABNORMAL LOW (ref 0.1–0.5)
Total Bilirubin: 0.3 mg/dL (ref 0.3–1.2)
Total Protein: 6.5 g/dL (ref 6.5–8.1)

## 2015-01-21 LAB — CBC
HEMATOCRIT: 21.4 % — AB (ref 35.0–47.0)
HEMOGLOBIN: 7 g/dL — AB (ref 12.0–16.0)
MCH: 25.6 pg — ABNORMAL LOW (ref 26.0–34.0)
MCHC: 32.8 g/dL (ref 32.0–36.0)
MCV: 78.1 fL — ABNORMAL LOW (ref 80.0–100.0)
Platelets: 327 10*3/uL (ref 150–440)
RBC: 2.73 MIL/uL — AB (ref 3.80–5.20)
RDW: 14.6 % — ABNORMAL HIGH (ref 11.5–14.5)
WBC: 8.2 10*3/uL (ref 3.6–11.0)

## 2015-01-21 LAB — CREATININE, SERUM: CREATININE: 0.73 mg/dL (ref 0.44–1.00)

## 2015-01-21 LAB — ABO/RH: ABO/RH(D): O POS

## 2015-01-21 LAB — PREPARE RBC (CROSSMATCH)

## 2015-01-21 MED ORDER — HEPARIN SOD (PORK) LOCK FLUSH 100 UNIT/ML IV SOLN
500.0000 [IU] | Freq: Every day | INTRAVENOUS | Status: AC | PRN
  Administered 2015-01-21: 500 [IU]
  Filled 2015-01-21 (×2): qty 5

## 2015-01-21 MED ORDER — SODIUM CHLORIDE 0.9 % IJ SOLN
3.0000 mL | INTRAMUSCULAR | Status: AC | PRN
  Filled 2015-01-21: qty 10

## 2015-01-21 MED ORDER — SODIUM CHLORIDE 0.9 % IV SOLN
250.0000 mL | Freq: Once | INTRAVENOUS | Status: AC
  Administered 2015-01-21: 250 mL via INTRAVENOUS
  Filled 2015-01-21: qty 250

## 2015-01-21 MED ORDER — ACETAMINOPHEN 325 MG PO TABS
650.0000 mg | ORAL_TABLET | Freq: Once | ORAL | Status: AC
  Administered 2015-01-21: 650 mg via ORAL
  Filled 2015-01-21: qty 2

## 2015-01-21 MED ORDER — SODIUM CHLORIDE 0.9 % IJ SOLN
10.0000 mL | INTRAMUSCULAR | Status: AC | PRN
  Filled 2015-01-21: qty 10

## 2015-01-21 MED ORDER — HEPARIN SOD (PORK) LOCK FLUSH 100 UNIT/ML IV SOLN: 250.0000 [IU] | INTRAVENOUS | Status: AC | PRN

## 2015-01-21 MED ORDER — DIPHENHYDRAMINE HCL 25 MG PO CAPS
25.0000 mg | ORAL_CAPSULE | Freq: Once | ORAL | Status: AC
  Administered 2015-01-21: 25 mg via ORAL

## 2015-01-21 NOTE — Progress Notes (Signed)
I have discussed the risks of transfusions with the patient (including but not limited to): Fluid overload, febrile transfusion reaction, transfusion reaction, Hepatitis B, Hepatitis C, HIV, as well as other currently unknown infectious complications. The patient will sign a written consent prior to the procedure.

## 2015-01-22 LAB — TYPE AND SCREEN
ABO/RH(D): O POS
Antibody Screen: NEGATIVE
Unit division: 0

## 2015-01-23 ENCOUNTER — Other Ambulatory Visit: Payer: Self-pay

## 2015-01-23 DIAGNOSIS — C3490 Malignant neoplasm of unspecified part of unspecified bronchus or lung: Secondary | ICD-10-CM

## 2015-01-25 ENCOUNTER — Inpatient Hospital Stay: Payer: 59

## 2015-01-25 ENCOUNTER — Other Ambulatory Visit: Payer: Self-pay | Admitting: Family Medicine

## 2015-01-25 DIAGNOSIS — C3490 Malignant neoplasm of unspecified part of unspecified bronchus or lung: Secondary | ICD-10-CM

## 2015-01-25 DIAGNOSIS — Z5111 Encounter for antineoplastic chemotherapy: Secondary | ICD-10-CM | POA: Diagnosis not present

## 2015-01-25 LAB — HEPATIC FUNCTION PANEL
ALBUMIN: 2.3 g/dL — AB (ref 3.5–5.0)
ALT: 36 U/L (ref 14–54)
AST: 39 U/L (ref 15–41)
Alkaline Phosphatase: 67 U/L (ref 38–126)
BILIRUBIN TOTAL: 0.4 mg/dL (ref 0.3–1.2)
Total Protein: 7.1 g/dL (ref 6.5–8.1)

## 2015-01-25 LAB — CBC WITH DIFFERENTIAL/PLATELET
BASOS PCT: 0 %
Band Neutrophils: 1 %
Basophils Absolute: 0 10*3/uL (ref 0–0.1)
EOS PCT: 2 %
Eosinophils Absolute: 0.3 10*3/uL (ref 0–0.7)
HCT: 24.3 % — ABNORMAL LOW (ref 35.0–47.0)
Hemoglobin: 7.9 g/dL — ABNORMAL LOW (ref 12.0–16.0)
Lymphocytes Relative: 7 %
Lymphs Abs: 1.2 10*3/uL (ref 1.0–3.6)
MCH: 25.8 pg — AB (ref 26.0–34.0)
MCHC: 32.5 g/dL (ref 32.0–36.0)
MCV: 79.2 fL — ABNORMAL LOW (ref 80.0–100.0)
MONOS PCT: 9 %
Monocytes Absolute: 1.6 10*3/uL — ABNORMAL HIGH (ref 0.2–0.9)
NEUTROS PCT: 81 %
Neutro Abs: 14.3 10*3/uL — ABNORMAL HIGH (ref 1.4–6.5)
Platelets: 552 10*3/uL — ABNORMAL HIGH (ref 150–440)
RBC: 3.07 MIL/uL — ABNORMAL LOW (ref 3.80–5.20)
RDW: 16.6 % — ABNORMAL HIGH (ref 11.5–14.5)
WBC: 17.4 10*3/uL — AB (ref 3.6–11.0)

## 2015-01-25 LAB — CREATININE, SERUM
CREATININE: 0.77 mg/dL (ref 0.44–1.00)
GFR calc Af Amer: >60 mL/min (ref >60)

## 2015-01-25 NOTE — Progress Notes (Addendum)
[facility]  [phone]  [phone]     [place]: May 09, 1956  MR#: [id number]  QIO#:[id number]  Patient Care Team: Marguerita Merles, MD as PCP - General (Family Medicine)  CHIEF COMPLAINT:  Chief Complaint  Patient presents with  . Follow-up    blood transfusion    INTERVAL HISTORY:  Patient is being evaluated today for anemia. She and her primary Oncologist, Dr. Ma Hillock, were notified yesterday of need for blood transfusion from external provider. She is in a wheelchair today due to fatigue. She denies any acute blood loss. She denies any nausea, vomiting, diarrhea, constipation, fever, or chills.   REVIEW OF SYSTEMS:   Review of Systems  Constitutional: Positive for malaise/fatigue.  Neurological: Positive for weakness.  All other systems reviewed and are negative.   As per HPI. Otherwise, a complete review of systems is negatve.  PAST MEDICAL HISTORY: Past Medical History  Diagnosis Date  . Lung cancer   . Hypertension   . Hypercholesteremia   . Metastatic lung cancer (metastasis from lung to other site) 01/21/2015  . Anemia 01/21/2015  . 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  FAMILY HISTORY Family History  Problem Relation Age of Onset  . Cervical cancer Mother   . Uterine cancer Maternal Grandmother     GYNECOLOGIC HISTORY:  No LMP recorded. Patient has had a hysterectomy.     ADVANCED DIRECTIVES:    HEALTH MAINTENANCE: History  Substance Use Topics  . Smoking status: Former Research scientist (life sciences)  . Smokeless tobacco: Not on file  . Alcohol Use: No     Colonoscopy:  PAP:  Bone density:  Lipid panel:  Allergies  Allergen Reactions  . No Known Allergies     Current Outpatient Prescriptions  Medication Sig Dispense Refill  . sulfamethoxazole-trimethoprim (BACTRIM DS,SEPTRA DS) 800-160 MG per tablet Take by mouth.    . ALPRAZolam (XANAX) 0.5 MG tablet Take  0.5 mg by mouth at bedtime as needed for anxiety.    . Ipratropium-Albuterol (COMBIVENT) 20-100 MCG/ACT AERS respimat Inhale 1 puff into the lungs 4 (four) times daily.    . Multiple Vitamins-Minerals (MULTIVITAMIN WITH MINERALS) tablet Take 1 tablet by mouth daily.    [name] omeprazole (PRILOSEC) 20 MG capsule Take 20 mg by mouth daily.    [name] oxyCODONE-acetaminophen (PERCOCET/ROXICET) 5-325 MG per tablet Take by mouth.    . promethazine (PHENERGAN) 25 MG tablet Take 25 mg by mouth every 6 (six) hours as needed for nausea or vomiting.    . simvastatin (ZOCOR) 20 MG tablet Take 20 mg by mouth daily.     No current facility-administered medications for this visit.   Facility-Administered Medications Ordered in Other Visits  Medication Dose Route Frequency Provider Last Rate Last Dose  . heparin lock flush 100 unit/mL  250 Units Intracatheter PRN Evlyn Kanner, NP   250 Units at 01/21/15 1734  . sodium chloride 0.9 % injection 10 mL  10 mL Intracatheter PRN Evlyn Kanner, NP   10 mL at 01/21/15 1734  . sodium chloride 0.9 % injection 3 mL  3 mL Intracatheter PRN Evlyn Kanner, NP   3 mL at 01/21/15 1731    OBJECTIVE: Filed Vitals:   01/21/15 0942  BP: 128/78  Pulse: 119  Temp: 97.8 F (36.6 C)     Body mass index is 30.37 kg/(m^2).    ECOG FS:2 - Symptomatic, <50% confined to bed  General: Well-developed, well-nourished, no acute distress. Eyes: Pale conjunctiva, anicteric sclera. Lungs: Clear to auscultation bilaterally. Heart: Regular rate and rhythm. No rubs, murmurs, or gallops. Musculoskeletal: No edema, cyanosis, or clubbing. Neuro: Alert, answering all questions appropriately. Cranial nerves grossly intact. Skin: No rashes or petechiae noted. Psych: Normal affect.    LAB RESULTS:     Component Value Date/Time   NA 138 01/11/2015 0929   NA 138 07/26/2009 1444   K 3.4* 01/11/2015 0929   K 3.8 07/26/2009 1444   CL 104 01/11/2015 0929   CL 105 07/26/2009 1444   CO2  25 01/11/2015 0929   GLUCOSE 142* 01/11/2015 0929   GLUCOSE 100* 07/26/2009 1444   BUN 9 01/11/2015 0929   BUN 19 07/26/2009 1444   CREATININE 0.73 01/21/2015 0852   CREATININE 0.80 01/11/2015 0929   CALCIUM 7.9* 01/11/2015 0929   PROT 6.5 01/21/2015 0852   PROT 5.3* 12/30/2014 0519   ALBUMIN 2.1* 01/21/2015 0852   ALBUMIN 1.9* 12/30/2014 0519   AST 26 01/21/2015 0852   AST 24 12/30/2014 0519   ALT 30 01/21/2015 0852   ALT 46 12/30/2014 0519   ALKPHOS 64 01/21/2015 0852   ALKPHOS 87 12/30/2014 0519   BILITOT 0.3 01/21/2015 0852   GFRNONAA >60 01/21/2015 0852   GFRNONAA >60 01/11/2015 0929   GFRAA >60 01/21/2015 0852   GFRAA >60 01/11/2015 0929    No results found for: SPEP, UPEP  Lab Results  Component Value Date   WBC 8.2 01/21/2015   NEUTROABS 13.7* 01/11/2015   HGB 7.0* 01/21/2015   HCT 21.4* 01/21/2015   MCV 78.1* 01/21/2015   PLT 327 01/21/2015      Chemistry      Component Value Date/Time   NA 138 01/11/2015 0929   NA 138 07/26/2009 1444   K 3.4* 01/11/2015 0929   K 3.8 07/26/2009 1444   CL 104 01/11/2015 0929   CL 105 07/26/2009 1444   CO2 25 01/11/2015 0929   BUN 9 01/11/2015 0929   BUN 19 07/26/2009 1444   CREATININE 0.73 01/21/2015 0852   CREATININE 0.80 01/11/2015 0929      Component Value Date/Time   CALCIUM 7.9* 01/11/2015 0929   ALKPHOS 64 01/21/2015 0852   ALKPHOS 87 12/30/2014 0519   AST 26 01/21/2015 0852   AST 24 12/30/2014 0519   ALT 30 01/21/2015 0852   ALT 46 12/30/2014 0519   BILITOT 0.3 01/21/2015 0852       No results found for: LABCA2  No components found for: WUJWJ191  No results for input(s): INR in the last 168 hours.     Component Value Date/Time   LABSPEC 1.025 07/26/2009 1425   PHURINE 6.0 07/26/2009 1425   GLUCOSEU NEGATIVE 07/26/2009 1425   HGBUR SMALL* 07/26/2009 1425   BILIRUBINUR NEGATIVE 07/26/2009 1425   KETONESUR NEGATIVE 07/26/2009 1425   PROTEINUR TRACE* 07/26/2009 1425   UROBILINOGEN 0.2  07/26/2009 1425   NITRITE NEGATIVE 07/26/2009 1425   LEUKOCYTESUR * 07/26/2009 1425    TRACE        BIOCHEMICAL TESTING ONLY. PLEASE ORDER ROUTINE URINALYSIS FROM MAIN LAB IF CONFIRMATORY TESTING NEEDED.    STUDIES: No results found.  ASSESSMENT:  Anemia requiring blood transfusion Lung Cancer  PLAN:   Will proceed with transfusion of pRBCs. I have discussed the risks of transfusions with the patient (including but not limited to): Fluid overload, febrile transfusion reaction, transfusion reaction, Hepatitis B, Hepatitis C, HIV, as well as other currently unknown  infectious complications. The patient will sign a written consent prior to the procedure.   Patient expressed understanding and was in agreement with this plan. She also understands that She can call clinic at any time with any questions, concerns, or complaints.    Metastatic lung cancer (metastasis from lung to other site)   Staging form: Lung, AJCC 7th Edition     Clinical: Stage Unknown ([place], NX, M1) - Signed by Evlyn Kanner, NP on 01/21/2015   Evlyn Kanner, NP   01/25/2015 9:13 AM

## 2015-01-27 ENCOUNTER — Other Ambulatory Visit: Payer: Self-pay

## 2015-01-27 DIAGNOSIS — C3491 Malignant neoplasm of unspecified part of right bronchus or lung: Secondary | ICD-10-CM

## 2015-01-29 ENCOUNTER — Other Ambulatory Visit: Payer: Self-pay | Admitting: Family Medicine

## 2015-02-01 ENCOUNTER — Inpatient Hospital Stay (HOSPITAL_BASED_OUTPATIENT_CLINIC_OR_DEPARTMENT_OTHER): Payer: 59 | Admitting: Internal Medicine

## 2015-02-01 ENCOUNTER — Inpatient Hospital Stay: Payer: 59

## 2015-02-01 VITALS — BP 127/75 | HR 101 | Temp 95.3°F | Ht 64.0 in | Wt 170.2 lb

## 2015-02-01 DIAGNOSIS — Z79899 Other long term (current) drug therapy: Secondary | ICD-10-CM

## 2015-02-01 DIAGNOSIS — C7931 Secondary malignant neoplasm of brain: Secondary | ICD-10-CM | POA: Diagnosis not present

## 2015-02-01 DIAGNOSIS — C3491 Malignant neoplasm of unspecified part of right bronchus or lung: Secondary | ICD-10-CM | POA: Diagnosis not present

## 2015-02-01 DIAGNOSIS — C7802 Secondary malignant neoplasm of left lung: Secondary | ICD-10-CM

## 2015-02-01 DIAGNOSIS — I10 Essential (primary) hypertension: Secondary | ICD-10-CM

## 2015-02-01 DIAGNOSIS — Z5111 Encounter for antineoplastic chemotherapy: Secondary | ICD-10-CM | POA: Diagnosis not present

## 2015-02-01 DIAGNOSIS — J910 Malignant pleural effusion: Secondary | ICD-10-CM

## 2015-02-01 DIAGNOSIS — C801 Malignant (primary) neoplasm, unspecified: Secondary | ICD-10-CM

## 2015-02-01 DIAGNOSIS — E780 Pure hypercholesterolemia: Secondary | ICD-10-CM

## 2015-02-01 DIAGNOSIS — M1990 Unspecified osteoarthritis, unspecified site: Secondary | ICD-10-CM

## 2015-02-01 LAB — CBC WITH DIFFERENTIAL/PLATELET
BASOS PCT: 2 %
Basophils Absolute: 0.2 10*3/uL — ABNORMAL HIGH (ref 0–0.1)
EOS ABS: 0.6 10*3/uL (ref 0–0.7)
Eosinophils Relative: 5 %
HEMATOCRIT: 24.2 % — AB (ref 35.0–47.0)
Hemoglobin: 7.8 g/dL — ABNORMAL LOW (ref 12.0–16.0)
LYMPHS PCT: 15 %
Lymphs Abs: 1.8 10*3/uL (ref 1.0–3.6)
MCH: 25.6 pg — ABNORMAL LOW (ref 26.0–34.0)
MCHC: 32.2 g/dL (ref 32.0–36.0)
MCV: 79.6 fL — ABNORMAL LOW (ref 80.0–100.0)
Monocytes Absolute: 1.4 10*3/uL — ABNORMAL HIGH (ref 0.2–0.9)
Monocytes Relative: 11 %
NEUTROS ABS: 8.1 10*3/uL — AB (ref 1.4–6.5)
NEUTROS PCT: 67 %
PLATELETS: 847 10*3/uL — AB (ref 150–440)
RBC: 3.04 MIL/uL — ABNORMAL LOW (ref 3.80–5.20)
RDW: 17.6 % — AB (ref 11.5–14.5)
WBC: 12.1 10*3/uL — AB (ref 3.6–11.0)

## 2015-02-01 LAB — BASIC METABOLIC PANEL
Anion gap: 10 (ref 5–15)
BUN: 17 mg/dL (ref 6–20)
CO2: 19 mmol/L — AB (ref 22–32)
Calcium: 9.2 mg/dL (ref 8.9–10.3)
Chloride: 105 mmol/L (ref 101–111)
Creatinine, Ser: 0.72 mg/dL (ref 0.44–1.00)
GFR calc Af Amer: >60 mL/min (ref >60)
GFR calc non Af Amer: >60 mL/min (ref >60)
GLUCOSE: 100 mg/dL — AB (ref 65–99)
POTASSIUM: 4.8 mmol/L (ref 3.5–5.1)
SODIUM: 134 mmol/L — AB (ref 135–145)

## 2015-02-01 LAB — FERRITIN: Ferritin: 547 ng/mL — ABNORMAL HIGH (ref 11–307)

## 2015-02-01 LAB — IRON AND TIBC
IRON: 16 ug/dL — AB (ref 28–170)
Saturation Ratios: 7 % — ABNORMAL LOW (ref 10.4–31.8)
TIBC: 219 ug/dL — AB (ref 250–450)
UIBC: 203 ug/dL

## 2015-02-01 LAB — FOLATE: FOLATE: 15.6 ng/mL (ref >5.9)

## 2015-02-01 LAB — VITAMIN B12: VITAMIN B 12: 827 pg/mL (ref 180–914)

## 2015-02-01 LAB — DAT, POLYSPECIFIC AHG (ARMC ONLY): POLYSPECIFIC AHG TEST: NEGATIVE

## 2015-02-01 MED ORDER — SODIUM CHLORIDE 0.9 % IV SOLN
500.0000 mg/m2 | Freq: Once | INTRAVENOUS | Status: AC
  Administered 2015-02-01: 925 mg via INTRAVENOUS
  Filled 2015-02-01: qty 37

## 2015-02-01 MED ORDER — SODIUM CHLORIDE 0.9 % IV SOLN
Freq: Once | INTRAVENOUS | Status: AC
  Administered 2015-02-01: 12:00:00 via INTRAVENOUS
  Filled 2015-02-01: qty 250

## 2015-02-01 MED ORDER — SODIUM CHLORIDE 0.9 % IJ SOLN
10.0000 mL | INTRAMUSCULAR | Status: AC | PRN
  Administered 2015-02-01: 10 mL
  Filled 2015-02-01: qty 10

## 2015-02-01 MED ORDER — SODIUM CHLORIDE 0.9 % IV SOLN
Freq: Once | INTRAVENOUS | Status: AC
  Administered 2015-02-01: 12:00:00 via INTRAVENOUS
  Filled 2015-02-01: qty 8

## 2015-02-01 MED ORDER — DRONABINOL 2.5 MG PO CAPS: 2.5000 mg | ORAL_CAPSULE | Freq: Two times a day (BID) | ORAL | Status: DC

## 2015-02-01 MED ORDER — SODIUM CHLORIDE 0.9 % IJ SOLN
10.0000 mL | INTRAMUSCULAR | Status: DC | PRN
  Administered 2015-02-01: 10 mL
  Filled 2015-02-01: qty 10

## 2015-02-01 MED ORDER — HEPARIN SOD (PORK) LOCK FLUSH 100 UNIT/ML IV SOLN: 250.0000 [IU] | INTRAVENOUS | Status: DC | PRN

## 2015-02-01 NOTE — Addendum Note (Signed)
Addended by: Luella Cook on: 02/01/2015 04:36 PM   Modules accepted: Orders

## 2015-02-01 NOTE — Progress Notes (Signed)
Allyson in infusion let me know that pt PICC line had flushed twice today after drawing blood and now not flushing at all.  Called vascular and spoke to [facility] over the phone to give her extra guidance to see if they could get picc line to flush.  Unsuccessful.  Vascular states they will not have opening til Friday 5/20 and advised me to call [facility] at [phone].  Called and arranged the company to come at 3:15 today and let Allyson in infusion know and pt does not want to stay any longer. She  Has completed chemo and she is tired and hungry and only wants to go home and rest and eat.  She was offered to eat here and she was offered to go home and eat and come back and she did not want that option.  I checked with [facility] to see if they could come tom.  I called pt at home and she is agreeable to coming tom. And wanted 8:30 but vascular can come 9:30 and pt agreeable.  Also the vascular company states that we need to have cathflo on site and I checked with Sonia Baller in pharmacy and they have one dose and can get more from main pharmacy.  I have spoke with Maudie Mercury in infusion who is doing charge to let her know what I have scheduled for tom.  Patient has been contacted also and she will be here 9:30.

## 2015-02-01 NOTE — Progress Notes (Signed)
PICC Line will no longer flush nor yield a blood return at this time. MD, Dr. Ma Hillock, notified via telephone. Vascular Nurse coming to evaluate PICC Line at this time. Peripheral IV is being started to infuse chemotherapy treatment today. Patient is coming back another day to have PICC Line further evaluated. Moishe Spice, RN is scheduling this.

## 2015-02-02 ENCOUNTER — Inpatient Hospital Stay: Payer: 59

## 2015-02-02 ENCOUNTER — Ambulatory Visit: Payer: 59

## 2015-02-02 DIAGNOSIS — C3490 Malignant neoplasm of unspecified part of unspecified bronchus or lung: Secondary | ICD-10-CM

## 2015-02-02 DIAGNOSIS — Z5111 Encounter for antineoplastic chemotherapy: Secondary | ICD-10-CM | POA: Diagnosis not present

## 2015-02-02 MED ORDER — ALTEPLASE 2 MG IJ SOLR
2.0000 mg | Freq: Once | INTRAMUSCULAR | Status: AC
  Administered 2015-02-02: 2 mg
  Filled 2015-02-02: qty 2

## 2015-02-08 ENCOUNTER — Other Ambulatory Visit: Payer: 59

## 2015-02-11 NOTE — Progress Notes (Signed)
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  12/01/14 - Right Pleural Fluid Cytology. POSITIVE FOR MALIGNANT CELLS. ADENOCARCINOMA, CONSISTENT WITH LUNG ORIGIN.  Patient starting palliative chemotherapy with Alimta/carboplatin on 12/14/14. Molecular test pending.     HISTORY OF PRESENT ILLNESS:  Patient returns for scheduled oncology follow-up and to plan next chemotherapy. States she is doing better with regards to energy levels, DOE is at baseline. Still gets intermittent cough, no sputum or hemoptysis. States the infection on right side has stopped draining and wound healed. Overall dyspnea and cough is slightly better. No hemoptysis. No new pain issues. Appetite is fair. No nausea, vomiting or diarrhea. No fever or chills. No other pain issues, 0/10. No new mod disturbances.  REVIEW OF SYSTEMS:   ROS As in HPI above. In addition, no fever, chills or sweats. No new headaches or focal weakness.  No new mood disturbances. No sore throat or dysphagia. No dizziness or palpitation. No abdominal pain, constipation, diarrhea, dysuria or hematuria. No new skin rash or bleeding symptoms. No new paresthesias in extremities. No polyuria polydipsia. PS ECOG 2.   PAST MEDICAL HISTORY: Past Medical History  Diagnosis Date  . Lung cancer   . Hypertension   . Hypercholesteremia   . Metastatic lung cancer (metastasis from lung to other site) 01/21/2015  . Anemia 01/21/2015  . 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  PAST SURGICAL HISTORY:   FAMILY HISTORY Family History  Problem Relation Age of Onset  . Cervical cancer Mother   . Uterine cancer Maternal Grandmother   Mother had cervical cancer and grandmother had uterine cancer. Denies other malignancy.    ADVANCED DIRECTIVES:   SOCIAL HISTORY: History  Substance Use Topics  . Smoking status: Former Research scientist (life sciences)  . Smokeless tobacco: Not on file  . Alcohol Use: No  Ex-smoker, quit early 2016, but smoked less than 1 pack per week. Social alcohol intake only about 6 packs on the weekends. Denies recreational drug usage. Works at [employer]. Lives with her husband.  Allergies  Allergen Reactions  . No Known Allergies     Current Outpatient Prescriptions  Medication Sig Dispense Refill  . ALPRAZolam (XANAX) 0.5 MG tablet Take 0.5 mg by mouth at bedtime as needed for anxiety.    . benzonatate (TESSALON) 100 MG capsule Take 100 mg by mouth 3 (three) times daily as needed for cough.    . Ipratropium-Albuterol (COMBIVENT) 20-100 MCG/ACT AERS respimat Inhale 1 puff into the lungs 4 (four) times daily.    . Multiple Vitamins-Minerals (MULTIVITAMIN WITH MINERALS) tablet Take 1 tablet by mouth daily.    [name] omeprazole (PRILOSEC) 20 MG capsule Take 20 mg by mouth daily.    [name] oxyCODONE-acetaminophen (PERCOCET/ROXICET) 5-325 MG per tablet Take by mouth.    . promethazine (PHENERGAN) 25 MG tablet Take 25 mg by mouth every 6 (six) hours as  needed for nausea or vomiting.    . simvastatin (ZOCOR) 20 MG tablet Take 20 mg by mouth daily.    [name] sulfamethoxazole-trimethoprim (BACTRIM DS,SEPTRA DS) 800-160 MG per tablet Take by mouth.    . dronabinol (MARINOL) 2.5 MG capsule Take 1 capsule (2.5 mg total) by mouth 2 (two) times daily before a meal. 60 capsule 0   No current facility-administered medications for  this visit.   Facility-Administered Medications Ordered in Other Visits  Medication Dose Route Frequency Provider Last Rate Last Dose  . heparin lock flush 100 unit/mL  250 Units Intracatheter PRN Evlyn Kanner, NP   250 Units at 01/21/15 1734  . sodium chloride 0.9 % injection 10 mL  10 mL Intracatheter PRN Evlyn Kanner, NP   10 mL at 01/21/15 1734  . sodium chloride 0.9 % injection 3 mL  3 mL Intracatheter PRN Evlyn Kanner, NP   3 mL at 01/21/15 1731    OBJECTIVE: Filed Vitals:   02/01/15 0935  BP: 127/75  Pulse: 101  Temp: 95.3 F (35.2 C)     Body mass index is 29.2 kg/(m^2).    ECOG FS:2 - Symptomatic, <50% confined to bed  GENERAL: Patient is alert and oriented and in no acute distress. There is no icterus. HEENT: EOMs intact. Oral exam negative for thrush or lesions.  CVS: S1S2, regular LUNGS: Bilaterally clear to auscultation, no rhonchi. ABDOMEN: Soft, nontender. No hepatosplenomegaly clinically.  NEURO: grossly nonfocal, cranial nerves are intact. Gait unremarkable. EXTREMITIES: No pedal edema. LYMPHATICS: No palpable adenopathy in axillary or inguinal areas.   LAB RESULTS:     Component Value Date/Time   NA 134* 02/01/2015 0913   NA 138 01/11/2015 0929   K 4.8 02/01/2015 0913   K 3.4* 01/11/2015 0929   CL 105 02/01/2015 0913   CL 104 01/11/2015 0929   CO2 19* 02/01/2015 0913   CO2 25 01/11/2015 0929   GLUCOSE 100* 02/01/2015 0913   GLUCOSE 142* 01/11/2015 0929   BUN 17 02/01/2015 0913   BUN 9 01/11/2015 0929   CREATININE 0.72 02/01/2015 0913   CREATININE 0.80 01/11/2015 0929   CALCIUM 9.2 02/01/2015 0913   CALCIUM 7.9* 01/11/2015 0929   PROT 7.1 01/25/2015 1058   PROT 5.3* 12/30/2014 0519   ALBUMIN 2.3* 01/25/2015 1058   ALBUMIN 1.9* 12/30/2014 0519   AST 39 01/25/2015 1058   AST 24 12/30/2014 0519   ALT 36 01/25/2015 1058   ALT 46 12/30/2014 0519   ALKPHOS 67 01/25/2015 1058   ALKPHOS 87 12/30/2014 0519   BILITOT 0.4 01/25/2015 1058    GFRNONAA >60 02/01/2015 0913   GFRNONAA >60 01/11/2015 0929   GFRAA >60 02/01/2015 0913   GFRAA >60 01/11/2015 0929    No results found for: SPEP, UPEP  Lab Results  Component Value Date   WBC 12.1* 02/01/2015   NEUTROABS 8.1* 02/01/2015   HGB 7.8* 02/01/2015   HCT 24.2* 02/01/2015   MCV 79.6* 02/01/2015   PLT 847* 02/01/2015             STUDIES: 12/01/14 - Right Pleural Fluid Cytology. POSITIVE FOR MALIGNANT CELLS. ADENOCARCINOMA, CONSISTENT WITH LUNG ORIGIN.  12/11/14 - CT head. IMPRESSION: 1. Multiple bilateral enhancing mass lesions compatible with metastatic disease to the brain. MRI would be more sensitive for small lesions if delineation of the specific number of lesions is important for the patient's treatment. 2. No evidence for hemorrhage or significant mass effect.  12/30/14 - CT Chest. IMPRESSION:  1. Decreased right hydropneumothorax with removal of right pleural catheter. Small residual subpulmonic fluid component persists with scattered foci of extrapleural air. Some improved aeration of the right lower lobe,, however diffuse lower lobe and perihilar right middle lobe consolidation persists. It is difficult to differentiate malignancy from atelectasis or pneumonia in the setting, however patient has had recent PET which better characterized this. 2. Diffuse pleural thickening and nodularity in the right hemithorax. Diffuse pulmonary metastatic disease.   ASSESSMENT / PLAN:   1. 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 On 12/01/14 - Right Pleural Fluid Cytology. POSITIVE FOR MALIGNANT CELLS. ADENOCARCINOMA, CONSISTENT WITH LUNG ORIGIN. Molecular testing for EGFR, ALK and others is negative  - reviewed labs from today and d/w patient. Clinically patient is doing better and feels slightly stronger, DOE is at baseline. Will continue chemo with Alimta and Carboplatin  since most recent CT scan still showed significant lung lesions. The patient was again explained about palliative intent of chemotherapy, possible response rates and possible side effects, she is agreeable to this treatment and expressed verbal consent. Will proceed with cycle 3 chemotherapy with Alimta and Carboplatin. Monitor weekly labs and follow-up in 3 weeks to plan continued treatment. 2. 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 3. Pain - under control, continue to monitor. 4. Nutrition - encouraged to maintain adequate protein caloric intake.     5. In between visits, the patient has been advised to call or come to the ER in case of fevers, acute sickness or new symptoms. She is agreeable to this plan.   Leia Alf, MD   02/11/2015 10:18 AM

## 2015-02-16 ENCOUNTER — Inpatient Hospital Stay: Payer: 59

## 2015-02-16 DIAGNOSIS — C3491 Malignant neoplasm of unspecified part of right bronchus or lung: Secondary | ICD-10-CM

## 2015-02-16 DIAGNOSIS — Z5111 Encounter for antineoplastic chemotherapy: Secondary | ICD-10-CM | POA: Diagnosis not present

## 2015-02-16 LAB — HEPATIC FUNCTION PANEL
ALBUMIN: 3.1 g/dL — AB (ref 3.5–5.0)
ALT: 54 U/L (ref 14–54)
AST: 34 U/L (ref 15–41)
Alkaline Phosphatase: 84 U/L (ref 38–126)
BILIRUBIN TOTAL: 0.4 mg/dL (ref 0.3–1.2)
Bilirubin, Direct: <0.1 mg/dL — ABNORMAL LOW (ref 0.1–0.5)
Total Protein: 7.4 g/dL (ref 6.5–8.1)

## 2015-02-16 LAB — CBC WITH DIFFERENTIAL/PLATELET
Basophils Absolute: 0 10*3/uL (ref 0–0.1)
Basophils Relative: 0 %
EOS PCT: 5 %
Eosinophils Absolute: 0.3 10*3/uL (ref 0–0.7)
HCT: 24.8 % — ABNORMAL LOW (ref 35.0–47.0)
HEMOGLOBIN: 7.9 g/dL — AB (ref 12.0–16.0)
LYMPHS ABS: 1.3 10*3/uL (ref 1.0–3.6)
LYMPHS PCT: 18 %
MCH: 25.4 pg — ABNORMAL LOW (ref 26.0–34.0)
MCHC: 31.9 g/dL — AB (ref 32.0–36.0)
MCV: 79.6 fL — AB (ref 80.0–100.0)
MONO ABS: 1 10*3/uL — AB (ref 0.2–0.9)
MONOS PCT: 15 %
Neutro Abs: 4.5 10*3/uL (ref 1.4–6.5)
Neutrophils Relative %: 62 %
Platelets: 454 10*3/uL — ABNORMAL HIGH (ref 150–440)
RBC: 3.12 MIL/uL — AB (ref 3.80–5.20)
RDW: 19.7 % — ABNORMAL HIGH (ref 11.5–14.5)
WBC: 7.1 10*3/uL (ref 3.6–11.0)

## 2015-02-16 LAB — CREATININE, SERUM: Creatinine, Ser: 0.63 mg/dL (ref 0.44–1.00)

## 2015-02-22 ENCOUNTER — Inpatient Hospital Stay (HOSPITAL_BASED_OUTPATIENT_CLINIC_OR_DEPARTMENT_OTHER): Payer: 59 | Admitting: Internal Medicine

## 2015-02-22 ENCOUNTER — Other Ambulatory Visit: Payer: Self-pay | Admitting: *Deleted

## 2015-02-22 ENCOUNTER — Inpatient Hospital Stay: Payer: 59 | Attending: Internal Medicine

## 2015-02-22 ENCOUNTER — Inpatient Hospital Stay: Payer: 59

## 2015-02-22 ENCOUNTER — Telehealth: Payer: Self-pay | Admitting: *Deleted

## 2015-02-22 VITALS — BP 151/92 | HR 101 | Temp 95.4°F | Resp 18 | Ht 64.0 in | Wt 170.0 lb

## 2015-02-22 DIAGNOSIS — E780 Pure hypercholesterolemia: Secondary | ICD-10-CM

## 2015-02-22 DIAGNOSIS — M1990 Unspecified osteoarthritis, unspecified site: Secondary | ICD-10-CM | POA: Diagnosis not present

## 2015-02-22 DIAGNOSIS — C7802 Secondary malignant neoplasm of left lung: Secondary | ICD-10-CM | POA: Insufficient documentation

## 2015-02-22 DIAGNOSIS — I10 Essential (primary) hypertension: Secondary | ICD-10-CM

## 2015-02-22 DIAGNOSIS — C771 Secondary and unspecified malignant neoplasm of intrathoracic lymph nodes: Secondary | ICD-10-CM | POA: Diagnosis not present

## 2015-02-22 DIAGNOSIS — D649 Anemia, unspecified: Secondary | ICD-10-CM

## 2015-02-22 DIAGNOSIS — Z5111 Encounter for antineoplastic chemotherapy: Secondary | ICD-10-CM | POA: Insufficient documentation

## 2015-02-22 DIAGNOSIS — C3431 Malignant neoplasm of lower lobe, right bronchus or lung: Secondary | ICD-10-CM | POA: Insufficient documentation

## 2015-02-22 DIAGNOSIS — J910 Malignant pleural effusion: Secondary | ICD-10-CM | POA: Diagnosis not present

## 2015-02-22 DIAGNOSIS — C3491 Malignant neoplasm of unspecified part of right bronchus or lung: Secondary | ICD-10-CM

## 2015-02-22 DIAGNOSIS — Z79899 Other long term (current) drug therapy: Secondary | ICD-10-CM | POA: Insufficient documentation

## 2015-02-22 DIAGNOSIS — R05 Cough: Secondary | ICD-10-CM | POA: Insufficient documentation

## 2015-02-22 DIAGNOSIS — Z87891 Personal history of nicotine dependence: Secondary | ICD-10-CM

## 2015-02-22 DIAGNOSIS — C7931 Secondary malignant neoplasm of brain: Secondary | ICD-10-CM | POA: Diagnosis not present

## 2015-02-22 LAB — CBC WITH DIFFERENTIAL/PLATELET
BASOS ABS: 0.1 10*3/uL (ref 0–0.1)
BASOS PCT: 1 %
EOS ABS: 0.2 10*3/uL (ref 0–0.7)
EOS PCT: 3 %
HCT: 26 % — ABNORMAL LOW (ref 35.0–47.0)
Hemoglobin: 8.4 g/dL — ABNORMAL LOW (ref 12.0–16.0)
LYMPHS ABS: 1.3 10*3/uL (ref 1.0–3.6)
Lymphocytes Relative: 16 %
MCH: 25.5 pg — AB (ref 26.0–34.0)
MCHC: 32.1 g/dL (ref 32.0–36.0)
MCV: 79.5 fL — AB (ref 80.0–100.0)
MONO ABS: 0.8 10*3/uL (ref 0.2–0.9)
MONOS PCT: 10 %
Neutro Abs: 5.7 10*3/uL (ref 1.4–6.5)
Neutrophils Relative %: 70 %
PLATELETS: 525 10*3/uL — AB (ref 150–440)
RBC: 3.27 MIL/uL — ABNORMAL LOW (ref 3.80–5.20)
RDW: 20.5 % — AB (ref 11.5–14.5)
WBC: 8.1 10*3/uL (ref 3.6–11.0)

## 2015-02-22 LAB — BASIC METABOLIC PANEL
Anion gap: 5 (ref 5–15)
BUN: 12 mg/dL (ref 6–20)
CO2: 22 mmol/L (ref 22–32)
Calcium: 8.8 mg/dL — ABNORMAL LOW (ref 8.9–10.3)
Chloride: 104 mmol/L (ref 101–111)
Creatinine, Ser: 0.64 mg/dL (ref 0.44–1.00)
GFR calc Af Amer: >60 mL/min (ref >60)
Glucose, Bld: 121 mg/dL — ABNORMAL HIGH (ref 65–99)
POTASSIUM: 3.7 mmol/L (ref 3.5–5.1)
SODIUM: 131 mmol/L — AB (ref 135–145)

## 2015-02-22 MED ORDER — ALPRAZOLAM 0.25 MG PO TABS: 0.2500 mg | ORAL_TABLET | Freq: Three times a day (TID) | ORAL | Status: DC | PRN

## 2015-02-22 MED ORDER — HEPARIN SOD (PORK) LOCK FLUSH 100 UNIT/ML IV SOLN
INTRAVENOUS | Status: AC
  Filled 2015-02-22: qty 5

## 2015-02-22 MED ORDER — SODIUM CHLORIDE 0.9 % IV SOLN
Freq: Once | INTRAVENOUS | Status: AC
  Administered 2015-02-22: 11:00:00 via INTRAVENOUS
  Filled 2015-02-22: qty 250

## 2015-02-22 MED ORDER — HEPARIN SOD (PORK) LOCK FLUSH 100 UNIT/ML IV SOLN
250.0000 [IU] | Freq: Once | INTRAVENOUS | Status: AC | PRN
  Administered 2015-02-22: 250 [IU]
  Filled 2015-02-22: qty 5

## 2015-02-22 MED ORDER — SODIUM CHLORIDE 0.9 % IJ SOLN
3.0000 mL | INTRAMUSCULAR | Status: DC | PRN
  Administered 2015-02-22: 3 mL via INTRAVENOUS
  Filled 2015-02-22: qty 10

## 2015-02-22 MED ORDER — SODIUM CHLORIDE 0.9 % IV SOLN
500.0000 mg/m2 | Freq: Once | INTRAVENOUS | Status: AC
  Administered 2015-02-22: 925 mg via INTRAVENOUS
  Filled 2015-02-22: qty 37

## 2015-02-22 MED ORDER — SODIUM CHLORIDE 0.9 % IV SOLN
Freq: Once | INTRAVENOUS | Status: AC
  Administered 2015-02-22: 12:00:00 via INTRAVENOUS
  Filled 2015-02-22: qty 8

## 2015-02-22 MED ORDER — CYANOCOBALAMIN 1000 MCG/ML IJ SOLN
1000.0000 ug | Freq: Once | INTRAMUSCULAR | Status: AC
  Administered 2015-02-22: 1000 ug via INTRAMUSCULAR
  Filled 2015-02-22: qty 1

## 2015-02-22 MED ORDER — SODIUM CHLORIDE 0.9 % IV SOLN
477.6000 mg | Freq: Once | INTRAVENOUS | Status: AC
  Administered 2015-02-22: 480 mg via INTRAVENOUS
  Filled 2015-02-22: qty 48

## 2015-02-22 NOTE — Telephone Encounter (Signed)
Pt had asked md in room to have xanax 0.25 mg tid as needed, she was on .5 at night.  Called in o.25 mg tid #90 and 1 refill to hosp. Pharmacy. Pt aware being called in

## 2015-02-27 NOTE — Progress Notes (Signed)
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  12/01/14 - Right Pleural Fluid Cytology. POSITIVE FOR MALIGNANT CELLS. ADENOCARCINOMA, CONSISTENT WITH LUNG ORIGIN.  Patient starting palliative chemotherapy with Alimta/carboplatin on 12/14/14. Molecular test pending.  HISTORY OF PRESENT ILLNESS:  Patient returns for scheduled oncology follow-up and to plan next chemotherapy. States she is doing better, DOE is better and is ambulating slightly longer distances. Has intermittent cough, no sputum or hemoptysis. States the infection on right side has stopped draining and wound healed. Overall dyspnea and cough is slightly better. No hemoptysis. No new pain issues. Appetite is fair. No nausea, vomiting or diarrhea. No fever or chills. No other pain issues, 0/10. No new mod disturbances.  REVIEW OF SYSTEMS:   ROS As in HPI above. In addition, no fever, chills or sweats. No new headaches or focal weakness.  No new mood disturbances. No  sore throat or dysphagia. No hemoptysis or chest pain. No dizziness or palpitation. No abdominal pain, constipation, diarrhea, dysuria or hematuria. No new skin rash or bleeding symptoms. No new paresthesias in extremities. PS ECOG 2  PAST MEDICAL HISTORY: Reviewed Past Medical History  Diagnosis Date  . Lung cancer   . Hypertension   . Hypercholesteremia   . Metastatic lung cancer (metastasis from lung to other site) 01/21/2015  . Anemia 01/21/2015  . 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   PAST SURGICAL HISTORY:Reviewed As above  FAMILY HISTORY:Reviewed Family History  Problem Relation Age of Onset  . Cervical cancer Mother   . Uterine cancer Maternal Grandmother     ADVANCED DIRECTIVES:  No - patient declined information   SOCIAL HISTORY: Reviewed History  Substance Use Topics  . Smoking status: Former Research scientist (life sciences)  . Smokeless tobacco: Not on file  . Alcohol Use: No    Allergies  Allergen Reactions  . No Known Allergies     Current Outpatient Prescriptions  Medication Sig Dispense Refill  . dronabinol (MARINOL) 2.5 MG capsule Take 1 capsule (2.5 mg total) by mouth 2 (two) times daily before a meal. 60 capsule 0  . ferrous sulfate 325 (65 FE) MG tablet Take 325 mg by mouth daily with breakfast.    . Multiple Vitamins-Minerals (MULTIVITAMIN WITH MINERALS) tablet Take 1 tablet by mouth daily.    [name] omeprazole (PRILOSEC) 20 MG capsule Take 20 mg by mouth daily.    [name] oxyCODONE-acetaminophen (PERCOCET/ROXICET) 5-325 MG per tablet Take by mouth.    . promethazine (PHENERGAN) 25 MG tablet Take 25 mg by mouth every 6 (six) hours as needed for nausea or vomiting.    . simvastatin (ZOCOR) 20 MG tablet Take 20 mg by mouth daily.    [name] ALPRAZolam (XANAX) 0.25 MG tablet Take 1 tablet (0.25 mg total) by mouth 3 (three) times daily as needed for anxiety. 90 tablet 1  . benzonatate (TESSALON) 100 MG capsule Take 100 mg by mouth 3 (three)  times daily as needed for cough.    . Ipratropium-Albuterol (COMBIVENT) 20-100 MCG/ACT AERS respimat Inhale 1 puff into the lungs 4 (four) times daily.     No current facility-administered medications for this visit.   Facility-Administered Medications Ordered in Other Visits  Medication Dose Route Frequency Provider Last Rate Last Dose  . heparin lock  flush 100 unit/mL  250 Units Intracatheter PRN Evlyn Kanner, NP   250 Units at 01/21/15 1734  . sodium chloride 0.9 % injection 10 mL  10 mL Intracatheter PRN Evlyn Kanner, NP   10 mL at 01/21/15 1734  . sodium chloride 0.9 % injection 3 mL  3 mL Intracatheter PRN Evlyn Kanner, NP   3 mL at 01/21/15 1731  . sodium chloride 0.9 % injection 3 mL  3 mL Intravenous PRN Leia Alf, MD   3 mL at 02/22/15 1330    OBJECTIVE: Filed Vitals:   02/22/15 0934  BP: 151/92  Pulse: 101  Temp: 95.4 F (35.2 C)  Resp: 18     Body mass index is 29.16 kg/(m^2).    ECOG FS:2 - Symptomatic, <50% confined to bed  GENERAL: Patient is alert and oriented and in no acute distress. There is no icterus. HEENT: EOMs intact. Oral exam negative for thrush or lesions. No cervical lymphadenopathy. CVS: S1S2, regular LUNGS: Bilaterally clear to auscultation, no rhonchi. ABDOMEN: Soft, nontender. No hepatosplenomegaly clinically.  NEURO: grossly nonfocal, cranial nerves are intact. Gait unremarkable. EXTREMITIES: No pedal edema.   LAB RESULTS:    Component Value Date/Time   NA 131* 02/22/2015 0911   NA 138 01/11/2015 0929   K 3.7 02/22/2015 0911   K 3.4* 01/11/2015 0929   CL 104 02/22/2015 0911   CL 104 01/11/2015 0929   CO2 22 02/22/2015 0911   CO2 25 01/11/2015 0929   GLUCOSE 121* 02/22/2015 0911   GLUCOSE 142* 01/11/2015 0929   BUN 12 02/22/2015 0911   BUN 9 01/11/2015 0929   CREATININE 0.64 02/22/2015 0911   CREATININE 0.80 01/11/2015 0929   CALCIUM 8.8* 02/22/2015 0911   CALCIUM 7.9* 01/11/2015 0929   PROT 7.4 02/16/2015 0859   PROT 5.3* 12/30/2014 0519   ALBUMIN 3.1* 02/16/2015 0859   ALBUMIN 1.9* 12/30/2014 0519   AST 34 02/16/2015 0859   AST 24 12/30/2014 0519   ALT 54 02/16/2015 0859   ALT 46 12/30/2014 0519   ALKPHOS 84 02/16/2015 0859   ALKPHOS 87 12/30/2014 0519   BILITOT 0.4 02/16/2015 0859   GFRNONAA >60 02/22/2015 0911   GFRNONAA >60 01/11/2015 0929   GFRAA >60  02/22/2015 0911   GFRAA >60 01/11/2015 0929    No results found for: SPEP, UPEP  Lab Results  Component Value Date   WBC 8.1 02/22/2015   NEUTROABS 5.7 02/22/2015   HGB 8.4* 02/22/2015   HCT 26.0* 02/22/2015   MCV 79.5* 02/22/2015   PLT 525* 02/22/2015     STUDIES: 12/01/14 - Right Pleural Fluid Cytology. POSITIVE FOR MALIGNANT CELLS. ADENOCARCINOMA, CONSISTENT WITH LUNG ORIGIN.  12/11/14 - CT head. IMPRESSION: 1. Multiple bilateral enhancing mass lesions compatible with metastatic disease to the brain. MRI would be more sensitive for small lesions if delineation of the specific number of lesions is important for the patient's treatment. 2. No evidence for hemorrhage or significant mass effect.  12/30/14 - CT Chest. IMPRESSION: 1. Decreased right hydropneumothorax with removal of right pleural catheter. Small residual subpulmonic fluid component persists with scattered foci of extrapleural air. Some  improved aeration of the right lower lobe,, however diffuse lower lobe and perihilar right middle lobe consolidation persists. It is difficult to differentiate malignancy from atelectasis or pneumonia in the setting, however patient has had recent PET which better characterized this. 2. Diffuse pleural thickening and nodularity in the right hemithorax. Diffuse pulmonary metastatic disease.  STAGING: Metastatic lung cancer (metastasis from lung to other site)   Staging form: Lung, AJCC 7th Edition     Clinical: Stage Unknown ([place], NX, M1) - Signed by Evlyn Kanner, NP on 01/21/2015   ASSESSMENT / PLAN:    1. 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 On 12/01/14 - Right Pleural Fluid Cytology. POSITIVE FOR MALIGNANT CELLS. ADENOCARCINOMA, CONSISTENT WITH LUNG ORIGIN. Molecular testing for EGFR, ALK and others is negative - have reviewed labs from today and d/w patient. She is overall  doing better and feels slightly stronger, DOE is at baseline. Will continue chemo with Alimta/Carboplatin since most recent CT scan still showed significant lung lesions. The patient was again explained about palliative intent of chemotherapy, possible response rates and possible side effects, she is agreeable to this treatment and expressed verbal consent. Will proceed with cycle 4 chemotherapy with Alimta and Carboplatin. Monitor weekly labs and follow-up in 3 weeks to plan continued treatment. 2. 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 3. Pain - under control, continue to monitor. 4. Nutrition - encouraged to maintain adequate protein caloric intake.  5. In between visits, the patient has been advised to call or come to the ER in case of fevers, acute sickness or new symptoms. She is agreeable to this plan    Leia Alf, MD   02/27/2015 10:54 PM

## 2015-03-01 ENCOUNTER — Other Ambulatory Visit: Payer: 59

## 2015-03-03 ENCOUNTER — Ambulatory Visit
Admission: RE | Admit: 2015-03-03 | Discharge: 2015-03-03 | Disposition: A | Discharge status: Home / Self Care | Payer: 59 | Source: Ambulatory Visit | Attending: Radiation Oncology | Admitting: Radiation Oncology

## 2015-03-03 ENCOUNTER — Encounter: Payer: Self-pay | Admitting: Radiation Oncology

## 2015-03-03 ENCOUNTER — Ambulatory Visit: Payer: 59 | Admitting: Radiation Oncology

## 2015-03-03 VITALS — BP 158/99 | HR 109 | Resp 20 | Wt 172.7 lb

## 2015-03-03 DIAGNOSIS — C7801 Secondary malignant neoplasm of right lung: Secondary | ICD-10-CM

## 2015-03-03 NOTE — Progress Notes (Signed)
Radiation Oncology Follow up Note  Name: Patricia Kelley   Date:   03/03/2015 MRN:  [id number] DOB: 01/04/1956    This 59 y.o. female presents to the clinic today for follow-up for stage IV lung cancer with brain metastasis.  REFERRING PROVIDER: Marguerita Merles, MD  HPI: Patient is a 59 year old female now 1 month out having completed whole brain radiation therapy for stage IV non-small cell lung carcinoma presumably adenocarcinoma with rib malignant right pleural effusion. She is. Currently on Alimta and carboplatinum. Molecular testing was negative for EGFR and ALK. She is seen today in routine follow-up for multiple brain metastasis. She has been completely discontinued from her Decadron. She does complain of some puffiness around her eyes. No headaches or any change in neurologic functioning.  COMPLICATIONS OF TREATMENT: none  FOLLOW UP COMPLIANCE: keeps appointments   PHYSICAL EXAM:  BP 158/99 mmHg  Pulse 109  Resp 20  Wt 172 lb 11.7 oz (78.35 kg) Well-developed female in NAD. Cranial nerves II through XII grossly intact. Motor sensory and DTR levels are equal and symmetric in the upper and lower extremities. Crude visual fields within normal range. Patient has alopecia of the scalp. Well-developed well-nourished patient in NAD. HEENT reveals PERLA, EOMI, discs not visualized.  Oral cavity is clear. No oral mucosal lesions are identified. Neck is clear without evidence of cervical or supraclavicular adenopathy. Lungs are clear to A&P. Cardiac examination is essentially unremarkable with regular rate and rhythm without murmur rub or thrill. Abdomen is benign with no organomegaly or masses noted. Motor sensory and DTR levels are equal and symmetric in the upper and lower extremities. Cranial nerves II through XII are grossly intact. Proprioception is intact. No peripheral adenopathy or edema is identified. No motor or sensory levels are noted. Crude visual fields are within normal  range.   RADIOLOGY RESULTS: No current radiology reports are noted  PLAN: At the present time I am turning follow-up care over to medical oncology. She will continue with stage IV lung cancer treatment under medical oncology's direction. I would be happy to reevaluate the patient at any time should further palliative treatment be indicated. I've assured her the puffiness around her eyes is still probably some residual swelling from the steroid-induced. That should improve over time. Patient is to call with any further concerns.  I would like to take this opportunity for allowing me to participate in the care of your patient.Armstead Peaks., MD

## 2015-03-08 ENCOUNTER — Ambulatory Visit
Admission: RE | Admit: 2015-03-08 | Discharge: 2015-03-08 | Disposition: A | Discharge status: Home / Self Care | Payer: 59 | Source: Ambulatory Visit | Attending: Internal Medicine | Admitting: Internal Medicine

## 2015-03-08 ENCOUNTER — Inpatient Hospital Stay: Payer: 59

## 2015-03-08 DIAGNOSIS — D649 Anemia, unspecified: Secondary | ICD-10-CM | POA: Diagnosis not present

## 2015-03-08 DIAGNOSIS — R918 Other nonspecific abnormal finding of lung field: Secondary | ICD-10-CM | POA: Diagnosis not present

## 2015-03-08 DIAGNOSIS — Z9221 Personal history of antineoplastic chemotherapy: Secondary | ICD-10-CM | POA: Insufficient documentation

## 2015-03-08 DIAGNOSIS — I10 Essential (primary) hypertension: Secondary | ICD-10-CM | POA: Insufficient documentation

## 2015-03-08 DIAGNOSIS — C3491 Malignant neoplasm of unspecified part of right bronchus or lung: Secondary | ICD-10-CM | POA: Diagnosis present

## 2015-03-08 DIAGNOSIS — Z8049 Family history of malignant neoplasm of other genital organs: Secondary | ICD-10-CM | POA: Diagnosis not present

## 2015-03-08 DIAGNOSIS — Z08 Encounter for follow-up examination after completed treatment for malignant neoplasm: Secondary | ICD-10-CM | POA: Insufficient documentation

## 2015-03-08 DIAGNOSIS — I700 Atherosclerosis of aorta: Secondary | ICD-10-CM | POA: Insufficient documentation

## 2015-03-08 DIAGNOSIS — E780 Pure hypercholesterolemia: Secondary | ICD-10-CM | POA: Diagnosis not present

## 2015-03-08 DIAGNOSIS — Z5111 Encounter for antineoplastic chemotherapy: Secondary | ICD-10-CM | POA: Diagnosis not present

## 2015-03-08 LAB — CBC WITH DIFFERENTIAL/PLATELET
Basophils Absolute: 0 10*3/uL (ref 0–0.1)
Basophils Relative: 1 %
EOS PCT: 4 %
Eosinophils Absolute: 0.2 10*3/uL (ref 0–0.7)
HCT: 26.1 % — ABNORMAL LOW (ref 35.0–47.0)
Hemoglobin: 8.4 g/dL — ABNORMAL LOW (ref 12.0–16.0)
LYMPHS ABS: 1 10*3/uL (ref 1.0–3.6)
Lymphocytes Relative: 21 %
MCH: 26.2 pg (ref 26.0–34.0)
MCHC: 32.1 g/dL (ref 32.0–36.0)
MCV: 81.5 fL (ref 80.0–100.0)
MONO ABS: 0.7 10*3/uL (ref 0.2–0.9)
Monocytes Relative: 16 %
Neutro Abs: 2.7 10*3/uL (ref 1.4–6.5)
Neutrophils Relative %: 58 %
Platelets: 204 10*3/uL (ref 150–440)
RBC: 3.2 MIL/uL — AB (ref 3.80–5.20)
RDW: 22.4 % — ABNORMAL HIGH (ref 11.5–14.5)
WBC: 4.6 10*3/uL (ref 3.6–11.0)

## 2015-03-08 LAB — HEPATIC FUNCTION PANEL
ALK PHOS: 60 U/L (ref 38–126)
ALT: 39 U/L (ref 14–54)
AST: 34 U/L (ref 15–41)
Albumin: 3.5 g/dL (ref 3.5–5.0)
BILIRUBIN TOTAL: 0.6 mg/dL (ref 0.3–1.2)
Bilirubin, Direct: <0.1 mg/dL — ABNORMAL LOW (ref 0.1–0.5)
Total Protein: 6.9 g/dL (ref 6.5–8.1)

## 2015-03-08 LAB — CREATININE, SERUM
Creatinine, Ser: 0.65 mg/dL (ref 0.44–1.00)
GFR calc Af Amer: >60 mL/min (ref >60)

## 2015-03-08 MED ORDER — IOHEXOL 300 MG/ML  SOLN
75.0000 mL | Freq: Once | INTRAMUSCULAR | Status: AC | PRN
  Administered 2015-03-08: 75 mL via INTRAVENOUS

## 2015-03-08 MED ORDER — HEPARIN SOD (PORK) LOCK FLUSH 100 UNIT/ML IV SOLN
500.0000 [IU] | Freq: Once | INTRAVENOUS | Status: AC
  Administered 2015-03-08: 500 [IU] via INTRAVENOUS

## 2015-03-16 ENCOUNTER — Inpatient Hospital Stay (HOSPITAL_BASED_OUTPATIENT_CLINIC_OR_DEPARTMENT_OTHER): Payer: 59 | Admitting: Internal Medicine

## 2015-03-16 ENCOUNTER — Inpatient Hospital Stay: Payer: 59

## 2015-03-16 VITALS — BP 122/79 | HR 89 | Temp 98.0°F | Resp 20 | Ht 64.0 in | Wt 172.0 lb

## 2015-03-16 DIAGNOSIS — J910 Malignant pleural effusion: Secondary | ICD-10-CM

## 2015-03-16 DIAGNOSIS — M1990 Unspecified osteoarthritis, unspecified site: Secondary | ICD-10-CM

## 2015-03-16 DIAGNOSIS — E780 Pure hypercholesterolemia: Secondary | ICD-10-CM

## 2015-03-16 DIAGNOSIS — C3490 Malignant neoplasm of unspecified part of unspecified bronchus or lung: Secondary | ICD-10-CM

## 2015-03-16 DIAGNOSIS — C7931 Secondary malignant neoplasm of brain: Secondary | ICD-10-CM

## 2015-03-16 DIAGNOSIS — C7802 Secondary malignant neoplasm of left lung: Secondary | ICD-10-CM | POA: Diagnosis not present

## 2015-03-16 DIAGNOSIS — Z79899 Other long term (current) drug therapy: Secondary | ICD-10-CM

## 2015-03-16 DIAGNOSIS — C771 Secondary and unspecified malignant neoplasm of intrathoracic lymph nodes: Secondary | ICD-10-CM | POA: Diagnosis not present

## 2015-03-16 DIAGNOSIS — C3491 Malignant neoplasm of unspecified part of right bronchus or lung: Secondary | ICD-10-CM

## 2015-03-16 DIAGNOSIS — C3431 Malignant neoplasm of lower lobe, right bronchus or lung: Secondary | ICD-10-CM | POA: Diagnosis not present

## 2015-03-16 DIAGNOSIS — Z5111 Encounter for antineoplastic chemotherapy: Secondary | ICD-10-CM | POA: Diagnosis not present

## 2015-03-16 DIAGNOSIS — Z87891 Personal history of nicotine dependence: Secondary | ICD-10-CM

## 2015-03-16 DIAGNOSIS — I10 Essential (primary) hypertension: Secondary | ICD-10-CM

## 2015-03-16 LAB — CBC WITH DIFFERENTIAL/PLATELET
BASOS ABS: 0 10*3/uL (ref 0–0.1)
BASOS PCT: 1 %
EOS ABS: 0.1 10*3/uL (ref 0–0.7)
Eosinophils Relative: 3 %
HCT: 29.6 % — ABNORMAL LOW (ref 35.0–47.0)
Hemoglobin: 9.5 g/dL — ABNORMAL LOW (ref 12.0–16.0)
LYMPHS ABS: 1.3 10*3/uL (ref 1.0–3.6)
LYMPHS PCT: 32 %
MCH: 26.6 pg (ref 26.0–34.0)
MCHC: 32.2 g/dL (ref 32.0–36.0)
MCV: 82.5 fL (ref 80.0–100.0)
MONO ABS: 0.6 10*3/uL (ref 0.2–0.9)
Monocytes Relative: 15 %
Neutro Abs: 2 10*3/uL (ref 1.4–6.5)
Neutrophils Relative %: 49 %
Platelets: 278 10*3/uL (ref 150–440)
RBC: 3.59 MIL/uL — AB (ref 3.80–5.20)
RDW: 22.5 % — ABNORMAL HIGH (ref 11.5–14.5)
WBC: 4.2 10*3/uL (ref 3.6–11.0)

## 2015-03-16 LAB — BASIC METABOLIC PANEL
Anion gap: 5 (ref 5–15)
BUN: 14 mg/dL (ref 6–20)
CALCIUM: 8.8 mg/dL — AB (ref 8.9–10.3)
CHLORIDE: 102 mmol/L (ref 101–111)
CO2: 27 mmol/L (ref 22–32)
CREATININE: 0.6 mg/dL (ref 0.44–1.00)
GFR calc non Af Amer: >60 mL/min (ref >60)
Glucose, Bld: 111 mg/dL — ABNORMAL HIGH (ref 65–99)
Potassium: 3.3 mmol/L — ABNORMAL LOW (ref 3.5–5.1)
Sodium: 134 mmol/L — ABNORMAL LOW (ref 135–145)

## 2015-03-16 MED ORDER — POTASSIUM CHLORIDE CRYS ER 10 MEQ PO TBCR: 10.0000 meq | EXTENDED_RELEASE_TABLET | Freq: Every day | ORAL | Status: DC

## 2015-03-16 MED ORDER — HEPARIN SOD (PORK) LOCK FLUSH 100 UNIT/ML IV SOLN
250.0000 [IU] | INTRAVENOUS | Status: AC | PRN
  Administered 2015-03-16: 250 [IU]
  Filled 2015-03-16: qty 5

## 2015-03-16 MED ORDER — SODIUM CHLORIDE 0.9 % IJ SOLN
10.0000 mL | INTRAMUSCULAR | Status: AC | PRN
  Administered 2015-03-16: 10 mL
  Filled 2015-03-16: qty 10

## 2015-03-16 MED ORDER — SODIUM CHLORIDE 0.9 % IV SOLN
500.0000 mg/m2 | Freq: Once | INTRAVENOUS | Status: AC
  Administered 2015-03-16: 925 mg via INTRAVENOUS
  Filled 2015-03-16: qty 37

## 2015-03-16 MED ORDER — SODIUM CHLORIDE 0.9 % IV SOLN
477.6000 mg | Freq: Once | INTRAVENOUS | Status: AC
  Administered 2015-03-16: 480 mg via INTRAVENOUS
  Filled 2015-03-16: qty 48

## 2015-03-16 MED ORDER — SODIUM CHLORIDE 0.9 % IV SOLN
Freq: Once | INTRAVENOUS | Status: AC
  Administered 2015-03-16: 11:00:00 via INTRAVENOUS
  Filled 2015-03-16: qty 1000

## 2015-03-16 MED ORDER — SODIUM CHLORIDE 0.9 % IV SOLN
Freq: Once | INTRAVENOUS | Status: AC
  Administered 2015-03-16: 11:00:00 via INTRAVENOUS
  Filled 2015-03-16: qty 8

## 2015-03-23 NOTE — Progress Notes (Signed)
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  12/01/14 - Right Pleural Fluid Cytology. POSITIVE FOR MALIGNANT CELLS. ADENOCARCINOMA, CONSISTENT WITH LUNG ORIGIN.  Patient starting palliative chemotherapy with Alimta/carboplatin on 12/14/14. Molecular test pending.  HISTORY OF PRESENT ILLNESS:  Patient returns for scheduled oncology follow-up and plan next chemotherapy. States she continues to feel stronger gradually, DOE is better and is ambulating slightly longer distances. Cough is better, no sputum or hemoptysis. Eating better also. No nausea, vomiting or diarrhea. No fever or chills. No new mod disturbances.  No new pain issues, 0/10.  REVIEW OF SYSTEMS:   ROS As in HPI above. In addition, no fever, chills. No new headaches or focal weakness.  No new mood disturbances. No  sore throat or dysphagia. No hemoptysis or chest pain. No dizziness or palpitation. No abdominal pain, constipation, diarrhea, dysuria or hematuria. No new skin rash or bleeding symptoms. No new paresthesias in extremities. PS ECOG 2  PAST MEDICAL HISTORY: Reviewed Past Medical History  Diagnosis Date  . Lung cancer   . Hypertension   . Hypercholesteremia   . Metastatic lung cancer (metastasis from lung to other site) 01/21/2015  . Anemia 01/21/2015  . 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   PAST SURGICAL HISTORY:Reviewed As above  FAMILY HISTORY:Reviewed Family History  Problem Relation Age of Onset  . Cervical cancer Mother   . Uterine cancer Maternal Grandmother     ADVANCED DIRECTIVES:  No - patient declined information   SOCIAL HISTORY: Reviewed History  Substance Use Topics  . Smoking status: Former Smoker  . Smokeless tobacco: Not on file  . Alcohol Use: No    Allergies  Allergen Reactions  . No Known Allergies     Current Outpatient Prescriptions  Medication Sig Dispense Refill  . ALPRAZolam (XANAX) 0.25 MG tablet Take 1 tablet (0.25 mg total) by mouth 3 (three) times daily as needed for anxiety. 90 tablet 1  . ferrous sulfate 325 (65 FE) MG tablet Take 325 mg by mouth daily with breakfast.    . lisinopril-hydrochlorothiazide (PRINZIDE,ZESTORETIC) 10-12.5 MG per tablet Take 1 tablet by mouth daily.    . Multiple Vitamins-Minerals (MULTIVITAMIN WITH MINERALS) tablet Take 1 tablet by mouth daily.    . omeprazole (PRILOSEC) 20 MG capsule Take 20 mg by mouth daily.    . promethazine (PHENERGAN) 25 MG tablet Take 25 mg by mouth every 6 (six) hours as needed for nausea or vomiting.    . simvastatin (ZOCOR) 20 MG tablet Take 20 mg by mouth daily.    . benzonatate (TESSALON) 100 MG capsule Take 100 mg by mouth 3 (three) times daily as needed for cough.    . dronabinol (MARINOL) 2.5 MG capsule Take 1 capsule (2.5 mg total) by mouth 2 (two) times daily before a meal. (Patient not taking: Reported on 03/16/2015) 60 capsule 0  . Ipratropium-Albuterol (COMBIVENT) 20-100 MCG/ACT AERS respimat   Inhale 1 puff into the lungs 4 (four) times daily.    . oxyCODONE-acetaminophen (PERCOCET/ROXICET) 5-325 MG per tablet Take 1 tablet by mouth every 6 (six) hours as needed for moderate pain.     . potassium chloride SA (K-DUR,KLOR-CON) 10 MEQ tablet Take 1 tablet (10 mEq total) by mouth daily. 6 tablet 0   No current facility-administered  medications for this visit.   Facility-Administered Medications Ordered in Other Visits  Medication Dose Route Frequency Provider Last Rate Last Dose  . heparin lock flush 100 unit/mL  250 Units Intracatheter PRN Leslie F Herring, NP   250 Units at 01/21/15 1734  . sodium chloride 0.9 % injection 10 mL  10 mL Intracatheter PRN Leslie F Herring, NP   10 mL at 01/21/15 1734  . sodium chloride 0.9 % injection 3 mL  3 mL Intracatheter PRN Leslie F Herring, NP   3 mL at 01/21/15 1731  . sodium chloride 0.9 % injection 3 mL  3 mL Intravenous PRN Sandeep Pandit, MD   3 mL at 02/22/15 1330    OBJECTIVE: Filed Vitals:   03/16/15 0928  BP: 122/79  Pulse: 89  Temp: 98 F (36.7 C)  Resp: 20     Body mass index is 29.5 kg/(m^2).    ECOG FS:2 - Symptomatic, <50% confined to bed  GENERAL: Alert and oriented and in no acute distress. No icterus. HEENT: EOMs intact. Oral exam negative for thrush. No cervical lymphadenopathy. CVS: S1S2, regular LUNGS: Bilaterally clear to auscultation, no rhonchi. ABDOMEN: Soft, nontender. No hepatomegaly clinically.  NEURO: grossly nonfocal, cranial nerves are intact.  EXTREMITIES: No pedal edema.   LAB RESULTS:    Component Value Date/Time   NA 134* 03/16/2015 0907   NA 138 01/11/2015 0929   K 3.3* 03/16/2015 0907   K 3.4* 01/11/2015 0929   CL 102 03/16/2015 0907   CL 104 01/11/2015 0929   CO2 27 03/16/2015 0907   CO2 25 01/11/2015 0929   GLUCOSE 111* 03/16/2015 0907   GLUCOSE 142* 01/11/2015 0929   BUN 14 03/16/2015 0907   BUN 9 01/11/2015 0929   CREATININE 0.60 03/16/2015 0907   CREATININE 0.80 01/11/2015 0929   CALCIUM 8.8* 03/16/2015 0907   CALCIUM 7.9* 01/11/2015 0929   PROT 6.9 03/08/2015 0909   PROT 5.3* 12/30/2014 0519   ALBUMIN 3.5 03/08/2015 0909   ALBUMIN 1.9* 12/30/2014 0519   AST 34 03/08/2015 0909   AST 24 12/30/2014 0519   ALT 39 03/08/2015 0909   ALT 46 12/30/2014 0519   ALKPHOS 60 03/08/2015 0909   ALKPHOS 87 12/30/2014 0519     BILITOT 0.6 03/08/2015 0909   GFRNONAA >60 03/16/2015 0907   GFRNONAA >60 01/11/2015 0929   GFRAA >60 03/16/2015 0907   GFRAA >60 01/11/2015 0929    No results found for: SPEP, UPEP  Lab Results  Component Value Date   WBC 4.2 03/16/2015   NEUTROABS 2.0 03/16/2015   HGB 9.5* 03/16/2015   HCT 29.6* 03/16/2015   MCV 82.5 03/16/2015   PLT 278 03/16/2015     STUDIES: 12/01/14 - Right Pleural Fluid Cytology. POSITIVE FOR MALIGNANT CELLS. ADENOCARCINOMA, CONSISTENT WITH LUNG ORIGIN.  12/11/14 - CT head. IMPRESSION: 1. Multiple bilateral enhancing mass lesions compatible with metastatic disease to the brain. MRI would be more sensitive for small lesions if delineation of the specific number of lesions is important for the patient's treatment. 2. No evidence for hemorrhage or significant mass effect.  12/30/14 - CT Chest.   IMPRESSION: 1. Decreased right hydropneumothorax with removal of right pleural catheter. Small residual subpulmonic fluid component persists with scattered foci of extrapleural air. Some improved aeration of the right lower lobe,, however diffuse lower lobe and perihilar right middle lobe consolidation persists. It is difficult to differentiate malignancy from atelectasis or pneumonia in the setting, however patient has had recent PET which better characterized this. 2. Diffuse pleural thickening and nodularity in the right hemithorax. Diffuse pulmonary metastatic disease.  03/08/15 - CT Chest. IMPRESSION:  1. Overall mild partial interval response to therapy. Tumor involving the right lower lobe appears decreased from previous exam with associated improved aeration to the right lung. 2. Numerous pulmonary nodules are again noted within both lungs consistent with metastatic disease. These appear stable in the interval. 3. Resolution of previous right supraclavicular and right paratracheal adenopathy. 4. Aortic atherosclerosis.   STAGING: Metastatic lung cancer (metastasis  from lung to other site)   Staging form: Lung, AJCC 7th Edition     Clinical: Stage Unknown ([place], NX, M1) - Signed by Evlyn Kanner, NP on 01/21/2015   ASSESSMENT / PLAN:    1. 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 On 12/01/14 - Right Pleural Fluid Cytology. POSITIVE FOR MALIGNANT CELLS. ADENOCARCINOMA, CONSISTENT WITH LUNG ORIGIN. Molecular testing for EGFR, ALK and others is negative - independently reviewed recent CT chest from 6/20 and also reviewed labs from today and d/w patient. Have explained that CT scan shows response to chemo, plan is to continue on current treatment regimen. Will continue chemo with Alimta/Carboplatin. Will proceed with cycle 5 chemotherapy with Alimta and Carboplatin. Monitor weekly labs and follow-up in 3 weeks to plan continued treatment. 2. 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 3. Pain - under control, continue to monitor. 4. Nutrition - encouraged to maintain adequate protein caloric intake.  5. In between visits, the patient has been advised to call or come to the ER in case of fevers, acute sickness or new symptoms. She is agreeable to this plan   Leia Alf, MD   03/23/2015 11:36 PM

## 2015-03-24 ENCOUNTER — Inpatient Hospital Stay: Payer: 59 | Attending: Internal Medicine

## 2015-03-24 DIAGNOSIS — D630 Anemia in neoplastic disease: Secondary | ICD-10-CM | POA: Insufficient documentation

## 2015-03-24 DIAGNOSIS — C7802 Secondary malignant neoplasm of left lung: Secondary | ICD-10-CM | POA: Insufficient documentation

## 2015-03-24 DIAGNOSIS — C771 Secondary and unspecified malignant neoplasm of intrathoracic lymph nodes: Secondary | ICD-10-CM | POA: Insufficient documentation

## 2015-03-24 DIAGNOSIS — C3490 Malignant neoplasm of unspecified part of unspecified bronchus or lung: Secondary | ICD-10-CM

## 2015-03-24 DIAGNOSIS — R0609 Other forms of dyspnea: Secondary | ICD-10-CM | POA: Insufficient documentation

## 2015-03-24 DIAGNOSIS — M1990 Unspecified osteoarthritis, unspecified site: Secondary | ICD-10-CM | POA: Insufficient documentation

## 2015-03-24 DIAGNOSIS — Z79899 Other long term (current) drug therapy: Secondary | ICD-10-CM | POA: Diagnosis not present

## 2015-03-24 DIAGNOSIS — E780 Pure hypercholesterolemia: Secondary | ICD-10-CM | POA: Diagnosis not present

## 2015-03-24 DIAGNOSIS — I10 Essential (primary) hypertension: Secondary | ICD-10-CM | POA: Diagnosis not present

## 2015-03-24 DIAGNOSIS — Z5111 Encounter for antineoplastic chemotherapy: Secondary | ICD-10-CM | POA: Diagnosis present

## 2015-03-24 DIAGNOSIS — C3431 Malignant neoplasm of lower lobe, right bronchus or lung: Secondary | ICD-10-CM | POA: Insufficient documentation

## 2015-03-24 DIAGNOSIS — J910 Malignant pleural effusion: Secondary | ICD-10-CM | POA: Diagnosis not present

## 2015-03-24 DIAGNOSIS — C7931 Secondary malignant neoplasm of brain: Secondary | ICD-10-CM | POA: Insufficient documentation

## 2015-03-24 DIAGNOSIS — Z87891 Personal history of nicotine dependence: Secondary | ICD-10-CM | POA: Insufficient documentation

## 2015-03-24 LAB — CBC WITH DIFFERENTIAL/PLATELET
BASOS PCT: 0 %
Basophils Absolute: 0 10*3/uL (ref 0–0.1)
Eosinophils Absolute: 0.1 10*3/uL (ref 0–0.7)
Eosinophils Relative: 2 %
HCT: 29 % — ABNORMAL LOW (ref 35.0–47.0)
Hemoglobin: 9.2 g/dL — ABNORMAL LOW (ref 12.0–16.0)
LYMPHS PCT: 42 %
Lymphs Abs: 1.2 10*3/uL (ref 1.0–3.6)
MCH: 26.5 pg (ref 26.0–34.0)
MCHC: 31.7 g/dL — ABNORMAL LOW (ref 32.0–36.0)
MCV: 83.8 fL (ref 80.0–100.0)
MONO ABS: 0.3 10*3/uL (ref 0.2–0.9)
Monocytes Relative: 11 %
Neutro Abs: 1.3 10*3/uL — ABNORMAL LOW (ref 1.4–6.5)
Neutrophils Relative %: 45 %
PLATELETS: 146 10*3/uL — AB (ref 150–440)
RBC: 3.46 MIL/uL — AB (ref 3.80–5.20)
RDW: 20.7 % — ABNORMAL HIGH (ref 11.5–14.5)
WBC: 2.9 10*3/uL — ABNORMAL LOW (ref 3.6–11.0)

## 2015-03-24 NOTE — Addendum Note (Signed)
Addended by: Leia Alf on: 03/24/2015 12:03 PM   Modules accepted: Level of Service

## 2015-03-25 ENCOUNTER — Other Ambulatory Visit: Payer: Self-pay | Admitting: Internal Medicine

## 2015-03-25 DIAGNOSIS — C3490 Malignant neoplasm of unspecified part of unspecified bronchus or lung: Secondary | ICD-10-CM

## 2015-03-31 ENCOUNTER — Inpatient Hospital Stay: Payer: 59

## 2015-03-31 ENCOUNTER — Ambulatory Visit
Admission: RE | Admit: 2015-03-31 | Discharge: 2015-03-31 | Disposition: A | Discharge status: Home / Self Care | Payer: 59 | Source: Ambulatory Visit | Attending: Internal Medicine | Admitting: Internal Medicine

## 2015-03-31 DIAGNOSIS — C3490 Malignant neoplasm of unspecified part of unspecified bronchus or lung: Secondary | ICD-10-CM | POA: Insufficient documentation

## 2015-03-31 DIAGNOSIS — C7931 Secondary malignant neoplasm of brain: Secondary | ICD-10-CM | POA: Diagnosis present

## 2015-03-31 DIAGNOSIS — Z5111 Encounter for antineoplastic chemotherapy: Secondary | ICD-10-CM | POA: Diagnosis not present

## 2015-03-31 LAB — CBC WITH DIFFERENTIAL/PLATELET
BASOS ABS: 0 10*3/uL (ref 0–0.1)
BASOS PCT: 1 %
EOS ABS: 0.1 10*3/uL (ref 0–0.7)
EOS PCT: 3 %
HEMATOCRIT: 30.5 % — AB (ref 35.0–47.0)
Hemoglobin: 9.7 g/dL — ABNORMAL LOW (ref 12.0–16.0)
LYMPHS ABS: 1.1 10*3/uL (ref 1.0–3.6)
LYMPHS PCT: 27 %
MCH: 27.2 pg (ref 26.0–34.0)
MCHC: 32 g/dL (ref 32.0–36.0)
MCV: 85.1 fL (ref 80.0–100.0)
Monocytes Absolute: 0.7 10*3/uL (ref 0.2–0.9)
Monocytes Relative: 16 %
NEUTROS ABS: 2.2 10*3/uL (ref 1.4–6.5)
Neutrophils Relative %: 53 %
Platelets: 242 10*3/uL (ref 150–440)
RBC: 3.58 MIL/uL — AB (ref 3.80–5.20)
RDW: 20.8 % — ABNORMAL HIGH (ref 11.5–14.5)
WBC: 4.2 10*3/uL (ref 3.6–11.0)

## 2015-03-31 LAB — HEPATIC FUNCTION PANEL
ALBUMIN: 4 g/dL (ref 3.5–5.0)
ALT: 41 U/L (ref 14–54)
AST: 35 U/L (ref 15–41)
Alkaline Phosphatase: 50 U/L (ref 38–126)
TOTAL PROTEIN: 7.6 g/dL (ref 6.5–8.1)
Total Bilirubin: 0.8 mg/dL (ref 0.3–1.2)

## 2015-03-31 LAB — CREATININE, SERUM: Creatinine, Ser: 0.68 mg/dL (ref 0.44–1.00)

## 2015-03-31 MED ORDER — GADOBENATE DIMEGLUMINE 529 MG/ML IV SOLN
20.0000 mL | Freq: Once | INTRAVENOUS | Status: AC | PRN
  Administered 2015-03-31: 16 mL via INTRAVENOUS

## 2015-04-06 ENCOUNTER — Other Ambulatory Visit: Payer: 59

## 2015-04-06 ENCOUNTER — Inpatient Hospital Stay (HOSPITAL_BASED_OUTPATIENT_CLINIC_OR_DEPARTMENT_OTHER): Payer: 59 | Admitting: Internal Medicine

## 2015-04-06 ENCOUNTER — Inpatient Hospital Stay: Payer: 59

## 2015-04-06 VITALS — BP 140/82 | HR 78 | Temp 97.9°F | Resp 18 | Ht 64.0 in | Wt 169.3 lb

## 2015-04-06 DIAGNOSIS — C3491 Malignant neoplasm of unspecified part of right bronchus or lung: Secondary | ICD-10-CM

## 2015-04-06 DIAGNOSIS — C7931 Secondary malignant neoplasm of brain: Secondary | ICD-10-CM | POA: Diagnosis not present

## 2015-04-06 DIAGNOSIS — C3490 Malignant neoplasm of unspecified part of unspecified bronchus or lung: Secondary | ICD-10-CM

## 2015-04-06 DIAGNOSIS — D630 Anemia in neoplastic disease: Secondary | ICD-10-CM

## 2015-04-06 DIAGNOSIS — C3431 Malignant neoplasm of lower lobe, right bronchus or lung: Secondary | ICD-10-CM

## 2015-04-06 DIAGNOSIS — C7802 Secondary malignant neoplasm of left lung: Secondary | ICD-10-CM | POA: Diagnosis not present

## 2015-04-06 DIAGNOSIS — C771 Secondary and unspecified malignant neoplasm of intrathoracic lymph nodes: Secondary | ICD-10-CM | POA: Diagnosis not present

## 2015-04-06 DIAGNOSIS — M1990 Unspecified osteoarthritis, unspecified site: Secondary | ICD-10-CM

## 2015-04-06 DIAGNOSIS — Z5111 Encounter for antineoplastic chemotherapy: Secondary | ICD-10-CM | POA: Diagnosis not present

## 2015-04-06 DIAGNOSIS — E780 Pure hypercholesterolemia: Secondary | ICD-10-CM

## 2015-04-06 DIAGNOSIS — Z79899 Other long term (current) drug therapy: Secondary | ICD-10-CM

## 2015-04-06 DIAGNOSIS — J910 Malignant pleural effusion: Secondary | ICD-10-CM

## 2015-04-06 DIAGNOSIS — I10 Essential (primary) hypertension: Secondary | ICD-10-CM

## 2015-04-06 DIAGNOSIS — Z87891 Personal history of nicotine dependence: Secondary | ICD-10-CM

## 2015-04-06 DIAGNOSIS — R0609 Other forms of dyspnea: Secondary | ICD-10-CM

## 2015-04-06 LAB — CBC WITH DIFFERENTIAL/PLATELET
BASOS ABS: 0 10*3/uL (ref 0–0.1)
Basophils Relative: 1 %
Eosinophils Absolute: 0.1 10*3/uL (ref 0–0.7)
Eosinophils Relative: 2 %
HEMATOCRIT: 30.6 % — AB (ref 35.0–47.0)
HEMOGLOBIN: 9.8 g/dL — AB (ref 12.0–16.0)
Lymphocytes Relative: 31 %
Lymphs Abs: 1.3 10*3/uL (ref 1.0–3.6)
MCH: 27.3 pg (ref 26.0–34.0)
MCHC: 32.1 g/dL (ref 32.0–36.0)
MCV: 84.8 fL (ref 80.0–100.0)
MONO ABS: 0.7 10*3/uL (ref 0.2–0.9)
MONOS PCT: 16 %
NEUTROS ABS: 2.1 10*3/uL (ref 1.4–6.5)
Neutrophils Relative %: 50 %
Platelets: 211 10*3/uL (ref 150–440)
RBC: 3.61 MIL/uL — ABNORMAL LOW (ref 3.80–5.20)
RDW: 20.2 % — AB (ref 11.5–14.5)
WBC: 4.2 10*3/uL (ref 3.6–11.0)

## 2015-04-06 LAB — BASIC METABOLIC PANEL
Anion gap: 7 (ref 5–15)
BUN: 13 mg/dL (ref 6–20)
CO2: 27 mmol/L (ref 22–32)
Calcium: 9.1 mg/dL (ref 8.9–10.3)
Chloride: 102 mmol/L (ref 101–111)
Creatinine, Ser: 0.61 mg/dL (ref 0.44–1.00)
GFR calc non Af Amer: >60 mL/min (ref >60)
Glucose, Bld: 124 mg/dL — ABNORMAL HIGH (ref 65–99)
POTASSIUM: 3.4 mmol/L — AB (ref 3.5–5.1)
Sodium: 136 mmol/L (ref 135–145)

## 2015-04-06 MED ORDER — POTASSIUM CHLORIDE CRYS ER 10 MEQ PO TBCR: 10.0000 meq | EXTENDED_RELEASE_TABLET | Freq: Every day | ORAL | Status: DC

## 2015-04-06 MED ORDER — HEPARIN SOD (PORK) LOCK FLUSH 100 UNIT/ML IV SOLN
250.0000 [IU] | Freq: Once | INTRAVENOUS | Status: AC | PRN
  Administered 2015-04-06: 250 [IU]
  Filled 2015-04-06: qty 5

## 2015-04-06 MED ORDER — SODIUM CHLORIDE 0.9 % IJ SOLN
10.0000 mL | INTRAMUSCULAR | Status: DC | PRN
  Administered 2015-04-06: 10 mL
  Filled 2015-04-06: qty 10

## 2015-04-06 MED ORDER — CYANOCOBALAMIN 1000 MCG/ML IJ SOLN
1000.0000 ug | Freq: Once | INTRAMUSCULAR | Status: AC
  Administered 2015-04-06: 1000 ug via INTRAMUSCULAR
  Filled 2015-04-06: qty 1

## 2015-04-06 MED ORDER — PEMETREXED DISODIUM CHEMO INJECTION 500 MG
500.0000 mg/m2 | Freq: Once | INTRAVENOUS | Status: AC
  Administered 2015-04-06: 925 mg via INTRAVENOUS
  Filled 2015-04-06: qty 37

## 2015-04-06 MED ORDER — SODIUM CHLORIDE 0.9 % IV SOLN
Freq: Once | INTRAVENOUS | Status: AC
  Administered 2015-04-06: 11:00:00 via INTRAVENOUS
  Filled 2015-04-06: qty 1000

## 2015-04-06 MED ORDER — SODIUM CHLORIDE 0.9 % IV SOLN
Freq: Once | INTRAVENOUS | Status: AC
  Administered 2015-04-06: 11:00:00 via INTRAVENOUS
  Filled 2015-04-06: qty 8

## 2015-04-06 MED ORDER — SODIUM CHLORIDE 0.9 % IV SOLN
477.6000 mg | Freq: Once | INTRAVENOUS | Status: AC
  Administered 2015-04-06: 480 mg via INTRAVENOUS
  Filled 2015-04-06: qty 48

## 2015-04-06 NOTE — Progress Notes (Signed)
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  12/01/14 - Right Pleural Fluid Cytology. POSITIVE FOR MALIGNANT CELLS. ADENOCARCINOMA, CONSISTENT WITH LUNG ORIGIN.  Patient starting palliative chemotherapy with Alimta/carboplatin on 12/14/14. Molecular test pending.  HISTORY OF PRESENT ILLNESS:  Patient returns for continued oncology evaluation and planned cycle 6 treatment with chemotherapy. States that she continues to have some dyspnea on exertion which is at baseline, denies any recent worsening. No progressive cough, right-sided chest pain or hemoptysis. No fever or chills. Eating well. Trying to stay active, uses walker for longer distances. No nausea, vomiting or diarrhea. No new pain issues.  REVIEW OF SYSTEMS:   ROS As in HPI above. In addition, no new headaches or focal weakness.  No new mood disturbances. No  sore throat or dysphagia. No hemoptysis or chest pain. No dizziness or palpitation. No abdominal pain, constipation, diarrhea, dysuria or hematuria. No new skin rash or bleeding symptoms. No new paresthesias in extremities. PS ECOG 2  PAST MEDICAL HISTORY: Reviewed Past Medical History  Diagnosis Date  . Lung cancer   . Hypertension   . Hypercholesteremia   . Metastatic lung cancer (metastasis from lung to other site) 01/21/2015  . Anemia 01/21/2015  . 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   PAST SURGICAL HISTORY:Reviewed As above  FAMILY HISTORY:Reviewed Family History  Problem Relation Age of Onset  . Cervical cancer Mother   . Uterine cancer Maternal Grandmother     ADVANCED DIRECTIVES:  No - patient declined information   SOCIAL HISTORY: Reviewed History  Substance Use Topics  . Smoking status: Former Research scientist (life sciences)  . Smokeless tobacco: Not on file  . Alcohol Use: No    Allergies  Allergen Reactions  . No Known Allergies     Current Outpatient Prescriptions  Medication Sig Dispense Refill  . ALPRAZolam (XANAX) 0.25 MG tablet Take 1 tablet (0.25 mg total) by mouth 3 (three) times daily as needed for anxiety. 90 tablet 1  . dronabinol (MARINOL) 2.5 MG capsule Take 1 capsule (2.5 mg total) by mouth 2 (two) times daily before a meal. 60 capsule 0  . ferrous sulfate 325 (65 FE) MG tablet Take 325 mg by mouth daily with breakfast.    . lisinopril-hydrochlorothiazide (PRINZIDE,ZESTORETIC) 10-12.5 MG per tablet Take 1 tablet by mouth daily.    . Multiple Vitamins-Minerals (MULTIVITAMIN WITH MINERALS) tablet Take 1 tablet by mouth daily.    [name] omeprazole (PRILOSEC) 20 MG capsule Take 20 mg by mouth daily.    [name] oxyCODONE-acetaminophen (PERCOCET/ROXICET) 5-325 MG per tablet Take 1 tablet by mouth every 6 (six) hours as needed for moderate pain.     . promethazine (PHENERGAN) 25 MG tablet Take 25 mg by mouth every 6 (six) hours as needed for nausea or vomiting.    . simvastatin (ZOCOR) 20 MG tablet Take 20 mg by mouth daily.    . benzonatate (TESSALON) 100 MG  capsule Take 100 mg by mouth 3 (three) times daily as needed for cough.    . potassium chloride (K-DUR,KLOR-CON) 10 MEQ tablet Take 1 tablet (10 mEq total) by mouth daily. 8 tablet 0   No current facility-administered medications for this visit.   Facility-Administered Medications Ordered in Other Visits  Medication Dose Route Frequency  Provider Last Rate Last Dose  . CARBOplatin (PARAPLATIN) 480 mg in sodium chloride 0.9 % 250 mL chemo infusion  480 mg Intravenous Once Leia Alf, MD      . cyanocobalamin ((VITAMIN B-12)) injection 1,000 mcg  1,000 mcg Intramuscular Once Leia Alf, MD      . heparin lock flush 100 unit/mL  250 Units Intracatheter PRN Evlyn Kanner, NP   250 Units at 01/21/15 1734  . heparin lock flush 100 unit/mL  250 Units Intracatheter Once PRN Leia Alf, MD      . ondansetron (ZOFRAN) 16 mg, dexamethasone (DECADRON) 20 mg in sodium chloride 0.9 % 50 mL IVPB   Intravenous Once Leia Alf, MD      . PEMEtrexed (ALIMTA) 925 mg in sodium chloride 0.9 % 100 mL chemo infusion  500 mg/m2 (Treatment Plan Actual) Intravenous Once Leia Alf, MD      . sodium chloride 0.9 % injection 10 mL  10 mL Intracatheter PRN Evlyn Kanner, NP   10 mL at 01/21/15 1734  . sodium chloride 0.9 % injection 10 mL  10 mL Intracatheter PRN Leia Alf, MD   10 mL at 04/06/15 0945  . sodium chloride 0.9 % injection 3 mL  3 mL Intracatheter PRN Evlyn Kanner, NP   3 mL at 01/21/15 1731  . sodium chloride 0.9 % injection 3 mL  3 mL Intravenous PRN Leia Alf, MD   3 mL at 02/22/15 1330    OBJECTIVE: Filed Vitals:   04/06/15 0954  BP: 140/82  Pulse: 78  Temp: 97.9 F (36.6 C)  Resp: 18     Body mass index is 29.05 kg/(m^2).    ECOG FS:2 - Symptomatic, <50% confined to bed  GENERAL: Patient is alert and oriented and in no acute distress. No icterus. HEENT: EOMs intact. Oral exam negative for thrush.  CVS: S1S2, regular LUNGS: Bilaterally clear to auscultation, no rhonchi. ABDOMEN: Soft, nontender. No hepatomegaly clinically.  NEURO: grossly nonfocal, cranial nerves are intact.  EXTREMITIES: No pedal edema.  LAB RESULTS:    Component Value Date/Time   NA 136 04/06/2015 0851   NA 138 01/11/2015 0929   K 3.4* 04/06/2015 0851   K 3.4* 01/11/2015 0929   CL 102 04/06/2015 0851   CL 104  01/11/2015 0929   CO2 27 04/06/2015 0851   CO2 25 01/11/2015 0929   GLUCOSE 124* 04/06/2015 0851   GLUCOSE 142* 01/11/2015 0929   BUN 13 04/06/2015 0851   BUN 9 01/11/2015 0929   CREATININE 0.61 04/06/2015 0851   CREATININE 0.80 01/11/2015 0929   CALCIUM 9.1 04/06/2015 0851   CALCIUM 7.9* 01/11/2015 0929   PROT 7.6 03/31/2015 0929   PROT 5.3* 12/30/2014 0519   ALBUMIN 4.0 03/31/2015 0929   ALBUMIN 1.9* 12/30/2014 0519   AST 35 03/31/2015 0929   AST 24 12/30/2014 0519   ALT 41 03/31/2015 0929   ALT 46 12/30/2014 0519   ALKPHOS 50 03/31/2015 0929   ALKPHOS 87 12/30/2014 0519   BILITOT 0.8 03/31/2015 0929   BILITOT 1.9* 12/30/2014 0519   GFRNONAA >60 04/06/2015 0851   GFRNONAA >60 01/11/2015 7579  GFRAA >60 04/06/2015 0851   GFRAA >60 01/11/2015 0929    Lab Results  Component Value Date   WBC 4.2 04/06/2015   NEUTROABS 2.1 04/06/2015   HGB 9.8* 04/06/2015   HCT 30.6* 04/06/2015   MCV 84.8 04/06/2015   PLT 211 04/06/2015     STUDIES: 12/01/14 - Right Pleural Fluid Cytology. POSITIVE FOR MALIGNANT CELLS. ADENOCARCINOMA, CONSISTENT WITH LUNG ORIGIN.  12/11/14 - CT head. IMPRESSION: 1. Multiple bilateral enhancing mass lesions compatible with metastatic disease to the brain. MRI would be more sensitive for small lesions if delineation of the specific number of lesions is important for the patient's treatment. 2. No evidence for hemorrhage or significant mass effect.  12/30/14 - CT Chest. IMPRESSION: 1. Decreased right hydropneumothorax with removal of right pleural catheter. Small residual subpulmonic fluid component persists with scattered foci of extrapleural air. Some improved aeration of the right lower lobe,, however diffuse lower lobe and perihilar right middle lobe consolidation persists. It is difficult to differentiate malignancy from atelectasis or pneumonia in the setting, however patient has had recent PET which better characterized this. 2. Diffuse pleural thickening  and nodularity in the right hemithorax. Diffuse pulmonary metastatic disease.  03/08/15 - CT Chest. IMPRESSION:  1. Overall mild partial interval response to therapy. Tumor involving the right lower lobe appears decreased from previous exam with associated improved aeration to the right lung. 2. Numerous pulmonary nodules are again noted within both lungs consistent with metastatic disease. These appear stable in the interval. 3. Resolution of previous right supraclavicular and right paratracheal adenopathy. 4. Aortic atherosclerosis.   STAGING: Metastatic lung cancer (metastasis from lung to other site)   Staging form: Lung, AJCC 7th Edition     Clinical: Stage Unknown ([place], NX, M1) - Signed by Evlyn Kanner, NP on 01/21/2015   ASSESSMENT / PLAN:    1. 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 On 12/01/14 - Right Pleural Fluid Cytology. POSITIVE FOR MALIGNANT CELLS. ADENOCARCINOMA, CONSISTENT WITH LUNG ORIGIN. Molecular testing for EGFR, ALK and others is negative. CT chest from 6/20 shows response to chemo  -  reviewed labs from today discussed with patient. Overall tolerating chemotherapy fairly well with minor side effects. Blood counts are adequate for treatment, we'll proceed with cycle 6 chemotherapy with Alimta and Carboplatin at same dosage. Monitor weekly labs, did CT scan of the chest in 2 weeks and follow-up in 3 weeks to plan continued treatment. Depending on CT scan report, we will make decision if he should continue on combination Alimta/carboplatin or switch to maintenance Alimta treatment. 2. 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 3. Pain - under control, continue to monitor. 4. Nutrition - encouraged to maintain adequate protein caloric intake.  5. In between visits, the patient  has been advised to call or come to the ER in case of fevers, acute sickness or new symptoms. She is agreeable to this plan   Leia Alf, MD   04/06/2015 10:59 AM

## 2015-04-06 NOTE — Progress Notes (Signed)
Pt eating good, drinking good.  Bowels ok. Only has occ. Pain when she takes deep breath and then it goes away

## 2015-04-12 ENCOUNTER — Other Ambulatory Visit: Payer: Self-pay | Admitting: *Deleted

## 2015-04-12 ENCOUNTER — Telehealth: Payer: Self-pay | Admitting: *Deleted

## 2015-04-12 DIAGNOSIS — C3491 Malignant neoplasm of unspecified part of right bronchus or lung: Secondary | ICD-10-CM

## 2015-04-12 DIAGNOSIS — Z01818 Encounter for other preprocedural examination: Secondary | ICD-10-CM

## 2015-04-12 DIAGNOSIS — C3490 Malignant neoplasm of unspecified part of unspecified bronchus or lung: Secondary | ICD-10-CM

## 2015-04-12 MED ORDER — DRONABINOL 2.5 MG PO CAPS: 2.5000 mg | ORAL_CAPSULE | Freq: Two times a day (BID) | ORAL | Status: DC

## 2015-04-12 NOTE — Telephone Encounter (Signed)
Pt's PICC line has not been changed since the 20th; does not have any current orders; need to know where to go from here?

## 2015-04-12 NOTE — Telephone Encounter (Signed)
Called pt and told her that when I spoke to supervisor at [facility] that she agreed to come out an teach pt to flush and bring supplies for that I agreed to change dressing once a week when pt come in for labs or chemo.  She is straight now on the process and we changed the time for tom to 11 am and she is agreeable to that.  Pt also knows about her appt [facility] on Thursday this week at 8 am. I have spoke to chemo also about picc labs, flush and dressing change.

## 2015-04-13 ENCOUNTER — Inpatient Hospital Stay: Payer: 59

## 2015-04-13 ENCOUNTER — Other Ambulatory Visit: Payer: 59

## 2015-04-13 ENCOUNTER — Other Ambulatory Visit: Payer: Self-pay | Admitting: *Deleted

## 2015-04-13 ENCOUNTER — Ambulatory Visit (HOSPITAL_BASED_OUTPATIENT_CLINIC_OR_DEPARTMENT_OTHER): Payer: 59 | Admitting: Cardiothoracic Surgery

## 2015-04-13 VITALS — BP 125/87 | HR 98 | Temp 97.6°F | Resp 18 | Ht 64.0 in | Wt 165.8 lb

## 2015-04-13 DIAGNOSIS — Z01818 Encounter for other preprocedural examination: Secondary | ICD-10-CM

## 2015-04-13 DIAGNOSIS — C3491 Malignant neoplasm of unspecified part of right bronchus or lung: Secondary | ICD-10-CM

## 2015-04-13 DIAGNOSIS — Z5111 Encounter for antineoplastic chemotherapy: Secondary | ICD-10-CM | POA: Diagnosis not present

## 2015-04-13 DIAGNOSIS — C799 Secondary malignant neoplasm of unspecified site: Secondary | ICD-10-CM

## 2015-04-13 DIAGNOSIS — C3490 Malignant neoplasm of unspecified part of unspecified bronchus or lung: Secondary | ICD-10-CM

## 2015-04-13 LAB — BASIC METABOLIC PANEL
Anion gap: 6 (ref 5–15)
BUN: 17 mg/dL (ref 6–20)
CO2: 29 mmol/L (ref 22–32)
Calcium: 9.3 mg/dL (ref 8.9–10.3)
Chloride: 101 mmol/L (ref 101–111)
Creatinine, Ser: 0.63 mg/dL (ref 0.44–1.00)
GFR calc Af Amer: >60 mL/min (ref >60)
Glucose, Bld: 132 mg/dL — ABNORMAL HIGH (ref 65–99)
POTASSIUM: 3.6 mmol/L (ref 3.5–5.1)
Sodium: 136 mmol/L (ref 135–145)

## 2015-04-13 LAB — CBC WITH DIFFERENTIAL/PLATELET
Basophils Absolute: 0 10*3/uL (ref 0–0.1)
Basophils Relative: 1 %
EOS ABS: 0.1 10*3/uL (ref 0–0.7)
Eosinophils Relative: 3 %
HCT: 31.5 % — ABNORMAL LOW (ref 35.0–47.0)
HEMOGLOBIN: 10.1 g/dL — AB (ref 12.0–16.0)
LYMPHS ABS: 1.1 10*3/uL (ref 1.0–3.6)
Lymphocytes Relative: 44 %
MCH: 27.2 pg (ref 26.0–34.0)
MCHC: 32.1 g/dL (ref 32.0–36.0)
MCV: 85 fL (ref 80.0–100.0)
MONO ABS: 0.2 10*3/uL (ref 0.2–0.9)
MONOS PCT: 8 %
NEUTROS ABS: 1.1 10*3/uL — AB (ref 1.4–6.5)
NEUTROS PCT: 44 %
Platelets: 102 10*3/uL — ABNORMAL LOW (ref 150–440)
RBC: 3.71 MIL/uL — ABNORMAL LOW (ref 3.80–5.20)
RDW: 17.8 % — ABNORMAL HIGH (ref 11.5–14.5)
WBC: 2.5 10*3/uL — ABNORMAL LOW (ref 3.6–11.0)

## 2015-04-13 LAB — HEPATIC FUNCTION PANEL
ALBUMIN: 4 g/dL (ref 3.5–5.0)
ALT: 40 U/L (ref 14–54)
AST: 37 U/L (ref 15–41)
Alkaline Phosphatase: 45 U/L (ref 38–126)
BILIRUBIN TOTAL: 1.2 mg/dL (ref 0.3–1.2)
Bilirubin, Direct: <0.1 mg/dL — ABNORMAL LOW (ref 0.1–0.5)
Total Protein: 7.5 g/dL (ref 6.5–8.1)

## 2015-04-13 LAB — APTT: aPTT: 29 seconds (ref 24–36)

## 2015-04-13 LAB — PROTIME-INR
INR: 0.98
Prothrombin Time: 13.2 seconds (ref 11.4–15.0)

## 2015-04-13 NOTE — Progress Notes (Signed)
Patricia Kelley Inpatient Post-Op Note  Patient ID: Patricia Kelley, female   DOB: 03-Nov-1955, 59 y.o.   MRN: [id number]  HISTORY: This patient is well known to me. She is a middle-aged woman with metastatic lung cancer who had a Pleurx catheter placed on the right side for malignant pleural effusion. That became infected with methicillin-resistant staph aureus. She's undergone a prolonged treatment with intravenous vancomycin for that. Her head was removed several weeks ago. She is now rated to continue her chemotherapy and requires a Port-A-Cath for insertion. She states she's been getting along very well. She has no pain. She has no fevers or chills. She's been tolerating her recent chemotherapy by way of her peripheral intravenous catheter.     Filed Vitals:   04/13/15 1003  BP: 125/87  Pulse: 98  Temp: 97.6 F (36.4 C)  Resp: 18     EXAM: Resp: Lungs are clear bilaterally.  No respiratory distress, normal effort. Heart:  Regular without murmurs Abd:  Abdomen is soft, non distended and non tender. No masses are palpable.  There is no rebound and no guarding.  Neurological: Alert and oriented to person, place, and time. Coordination normal.  Skin: Skin is warm and dry. No rash noted. No diaphoretic. No erythema. No pallor.  Psychiatric: Normal mood and affect. Normal behavior. Judgment and thought content normal.    ASSESSMENT: I have reviewed with the patient the indications and risks of Port-A-Cath placement. Risks of bleeding infection pneumothorax were all reviewed. She is scheduled to undergo additional imaging studies on August 2 and this may influence whether or not chemotherapy is to be administered. This reason we will hold off until after that date for insertion of her Port-A-Cath. We will obtain all the routine preoperative labs today.   PLAN:   We will obtain routine preoperative labs. Informed consent was obtained. Patient is aware of the indications and risks.    Nestor Lewandowsky, MD

## 2015-04-13 NOTE — Progress Notes (Signed)
Pt here to discuss getting port placement, currently has PICC line. Still having appetite issue.

## 2015-04-14 ENCOUNTER — Telehealth: Payer: Self-pay | Admitting: Cardiothoracic Surgery

## 2015-04-14 ENCOUNTER — Telehealth: Payer: Self-pay | Admitting: *Deleted

## 2015-04-14 NOTE — Telephone Encounter (Signed)
Spoke to [place] and she states that the order ran out a long time ago. I explained that yes I had spoke to staff supervisor about pt because once she finished the IVATB her case was closed but pt had PICC line that no one had taught her how to flush the PICC line and gave her supplies.  When I spoke to [place] on the phone to get this taken care of I agreed to have picc line dressing change each week in cancer center because pt had to come once a week and get labs done or get chemo.  The patient is now being set up for port on 8/8 but the picc line will not be removed until that day when the doctor knows he has port in and functioning correctly.  Patient told me on Monday when I spoke to her over the phone that she has plenty of supplies and knew how and she is flushing line every day.  Cindy and I agree she no longer needs additional services.

## 2015-04-14 NOTE — Telephone Encounter (Signed)
Pt advised of pre op date/time and sx date. Sx: 04/26/15 with Dr Rolley Sims insertion Pre op: Phone: 04/20/15 between 1-5pm.

## 2015-04-15 ENCOUNTER — Ambulatory Visit: Payer: 59 | Admitting: Cardiothoracic Surgery

## 2015-04-16 ENCOUNTER — Telehealth: Payer: Self-pay | Admitting: Cardiothoracic Surgery

## 2015-04-16 NOTE — Telephone Encounter (Signed)
No authorization is required for CPT: 36561 per Ermelinda Das with [facility]. DOS: 04/26/15. REF# Ermelinda Das 11:11am 04/16/15.

## 2015-04-20 ENCOUNTER — Encounter: Payer: Self-pay | Admitting: *Deleted

## 2015-04-20 ENCOUNTER — Inpatient Hospital Stay: Admission: RE | Admit: 2015-04-20 | Payer: 59 | Source: Ambulatory Visit

## 2015-04-20 ENCOUNTER — Inpatient Hospital Stay: Payer: 59 | Attending: Internal Medicine

## 2015-04-20 ENCOUNTER — Inpatient Hospital Stay: Payer: 59

## 2015-04-20 ENCOUNTER — Ambulatory Visit
Admission: RE | Admit: 2015-04-20 | Discharge: 2015-04-20 | Disposition: A | Discharge status: Home / Self Care | Payer: 59 | Source: Ambulatory Visit | Attending: Internal Medicine | Admitting: Internal Medicine

## 2015-04-20 DIAGNOSIS — C7800 Secondary malignant neoplasm of unspecified lung: Secondary | ICD-10-CM | POA: Diagnosis not present

## 2015-04-20 DIAGNOSIS — C3490 Malignant neoplasm of unspecified part of unspecified bronchus or lung: Secondary | ICD-10-CM

## 2015-04-20 DIAGNOSIS — E780 Pure hypercholesterolemia: Secondary | ICD-10-CM | POA: Diagnosis not present

## 2015-04-20 DIAGNOSIS — Z5111 Encounter for antineoplastic chemotherapy: Secondary | ICD-10-CM | POA: Diagnosis present

## 2015-04-20 DIAGNOSIS — M1990 Unspecified osteoarthritis, unspecified site: Secondary | ICD-10-CM | POA: Diagnosis not present

## 2015-04-20 DIAGNOSIS — J910 Malignant pleural effusion: Secondary | ICD-10-CM | POA: Insufficient documentation

## 2015-04-20 DIAGNOSIS — N289 Disorder of kidney and ureter, unspecified: Secondary | ICD-10-CM | POA: Insufficient documentation

## 2015-04-20 DIAGNOSIS — R0609 Other forms of dyspnea: Secondary | ICD-10-CM | POA: Diagnosis not present

## 2015-04-20 DIAGNOSIS — J90 Pleural effusion, not elsewhere classified: Secondary | ICD-10-CM | POA: Insufficient documentation

## 2015-04-20 DIAGNOSIS — K219 Gastro-esophageal reflux disease without esophagitis: Secondary | ICD-10-CM | POA: Diagnosis not present

## 2015-04-20 DIAGNOSIS — C3491 Malignant neoplasm of unspecified part of right bronchus or lung: Secondary | ICD-10-CM | POA: Diagnosis not present

## 2015-04-20 DIAGNOSIS — I10 Essential (primary) hypertension: Secondary | ICD-10-CM | POA: Diagnosis not present

## 2015-04-20 DIAGNOSIS — Z79899 Other long term (current) drug therapy: Secondary | ICD-10-CM | POA: Diagnosis not present

## 2015-04-20 DIAGNOSIS — Z87891 Personal history of nicotine dependence: Secondary | ICD-10-CM | POA: Diagnosis not present

## 2015-04-20 DIAGNOSIS — C7802 Secondary malignant neoplasm of left lung: Secondary | ICD-10-CM | POA: Diagnosis not present

## 2015-04-20 DIAGNOSIS — C7931 Secondary malignant neoplasm of brain: Secondary | ICD-10-CM | POA: Diagnosis not present

## 2015-04-20 LAB — CBC WITH DIFFERENTIAL/PLATELET
BASOS ABS: 0 10*3/uL (ref 0–0.1)
Basophils Relative: 0 %
Eosinophils Absolute: 0.1 10*3/uL (ref 0–0.7)
Eosinophils Relative: 2 %
HCT: 31.7 % — ABNORMAL LOW (ref 35.0–47.0)
HEMOGLOBIN: 10.4 g/dL — AB (ref 12.0–16.0)
Lymphocytes Relative: 39 %
Lymphs Abs: 1.4 10*3/uL (ref 1.0–3.6)
MCH: 27.9 pg (ref 26.0–34.0)
MCHC: 32.8 g/dL (ref 32.0–36.0)
MCV: 85.1 fL (ref 80.0–100.0)
Monocytes Absolute: 0.5 10*3/uL (ref 0.2–0.9)
Monocytes Relative: 15 %
NEUTROS ABS: 1.6 10*3/uL (ref 1.4–6.5)
Neutrophils Relative %: 44 %
Platelets: 197 10*3/uL (ref 150–440)
RBC: 3.72 MIL/uL — ABNORMAL LOW (ref 3.80–5.20)
RDW: 18.5 % — AB (ref 11.5–14.5)
WBC: 3.6 10*3/uL (ref 3.6–11.0)

## 2015-04-20 LAB — HEPATIC FUNCTION PANEL
ALK PHOS: 48 U/L (ref 38–126)
ALT: 40 U/L (ref 14–54)
AST: 34 U/L (ref 15–41)
Albumin: 4.1 g/dL (ref 3.5–5.0)
Bilirubin, Direct: <0.1 mg/dL — ABNORMAL LOW (ref 0.1–0.5)
TOTAL PROTEIN: 7.4 g/dL (ref 6.5–8.1)
Total Bilirubin: 0.4 mg/dL (ref 0.3–1.2)

## 2015-04-20 LAB — CREATININE, SERUM
Creatinine, Ser: 0.61 mg/dL (ref 0.44–1.00)
GFR calc Af Amer: >60 mL/min (ref >60)

## 2015-04-20 MED ORDER — IOHEXOL 300 MG/ML  SOLN
75.0000 mL | Freq: Once | INTRAMUSCULAR | Status: AC | PRN
  Administered 2015-04-20: 75 mL via INTRAVENOUS

## 2015-04-20 NOTE — Patient Instructions (Signed)
  Your procedure is scheduled on: 04-26-15 Report to [facility] To find out your arrival time please call [phone] between 1PM - 3PM on 04-23-15  Remember: Instructions that are not followed completely may result in serious medical risk, up to and including death, or upon the discretion of your surgeon and anesthesiologist your surgery may need to be rescheduled.    _X___ 1. Do not eat food or drink liquids after midnight. No gum chewing or hard candies.     _X___ 2. No Alcohol for 24 hours before or after surgery.   ____ 3. Bring all medications with you on the day of surgery if instructed.    _X___ 4. Notify your doctor if there is any change in your medical condition     (cold, fever, infections).     Do not wear jewelry, make-up, hairpins, clips or nail polish.  Do not wear lotions, powders, or perfumes. You may wear deodorant.  Do not shave 48 hours prior to surgery. Men may shave face and neck.  Do not bring valuables to the hospital.    [facility] is not responsible for any belongings or valuables.               Contacts, dentures or bridgework may not be worn into surgery.  Leave your suitcase in the car. After surgery it may be brought to your room.  For patients admitted to the hospital, discharge time is determined by your  treatment team.   Patients discharged the day of surgery will not be allowed to drive home.   Please read over the following fact sheets that you were given:    _X___ Take these medicines the morning of surgery with A SIP OF WATER:    1. PRILOSEC  2. TAKE PRILOSEC Sunday NIGHT   3. MAY TAKE PERCOCET AND XANAX IF NEEDED DAY OF SURGERY WITH SMALL SIP OF WATER  4.  5.  6.  ____ Fleet Enema (as directed)   _X___ Use CHG Soap as directed  ____ Use inhalers on the day of surgery  ____ Stop metformin 2 days prior to surgery    ____ Take 1/2 of usual insulin dose the night before surgery and none on the morning of  surgery.   ____ Stop Coumadin/Plavix/aspirin-N/A  ____ Stop Anti-inflammatories-NO NSAIDS OR ASPIRIN PRODUCTS-PERCOCET OK TO CONTINUE   ____ Stop supplements until after surgery.    ____ Bring C-Pap to the hospital.

## 2015-04-21 ENCOUNTER — Telehealth: Payer: Self-pay | Admitting: *Deleted

## 2015-04-21 NOTE — Telephone Encounter (Signed)
Almost out of potassium and there are no refills, Asking if it is to be discontinued or does she need a refill? Last K+ on 7/26 was 3.6

## 2015-04-21 NOTE — Telephone Encounter (Signed)
Informed that she no longer needs potassium and to keep apptas scheduled

## 2015-04-21 NOTE — Telephone Encounter (Signed)
No need for potassium supplement at this time, keep appointments the same. Thanks.

## 2015-04-22 ENCOUNTER — Ambulatory Visit
Admission: RE | Admit: 2015-04-22 | Discharge: 2015-04-22 | Disposition: A | Discharge status: Home / Self Care | Payer: 59 | Source: Ambulatory Visit | Attending: Cardiothoracic Surgery | Admitting: Cardiothoracic Surgery

## 2015-04-22 ENCOUNTER — Encounter
Admission: RE | Admit: 2015-04-22 | Discharge: 2015-04-22 | Disposition: A | Discharge status: Home / Self Care | Payer: 59 | Source: Ambulatory Visit | Attending: Cardiothoracic Surgery | Admitting: Cardiothoracic Surgery

## 2015-04-22 DIAGNOSIS — Z85118 Personal history of other malignant neoplasm of bronchus and lung: Secondary | ICD-10-CM | POA: Insufficient documentation

## 2015-04-22 DIAGNOSIS — J869 Pyothorax without fistula: Secondary | ICD-10-CM

## 2015-04-22 DIAGNOSIS — J90 Pleural effusion, not elsewhere classified: Secondary | ICD-10-CM | POA: Diagnosis not present

## 2015-04-22 DIAGNOSIS — R918 Other nonspecific abnormal finding of lung field: Secondary | ICD-10-CM | POA: Insufficient documentation

## 2015-04-26 ENCOUNTER — Encounter: Payer: Self-pay | Admitting: *Deleted

## 2015-04-26 ENCOUNTER — Ambulatory Visit
Admission: RE | Admit: 2015-04-26 | Discharge: 2015-04-26 | Disposition: A | Discharge status: Home / Self Care | Payer: 59 | Source: Ambulatory Visit | Attending: Cardiothoracic Surgery | Admitting: Cardiothoracic Surgery

## 2015-04-26 ENCOUNTER — Ambulatory Visit: Payer: 59

## 2015-04-26 ENCOUNTER — Encounter
Admission: RE | Disposition: A | Discharge status: Home / Self Care | Payer: Self-pay | Source: Ambulatory Visit | Attending: Cardiothoracic Surgery

## 2015-04-26 ENCOUNTER — Ambulatory Visit: Payer: 59 | Admitting: Anesthesiology

## 2015-04-26 ENCOUNTER — Other Ambulatory Visit: Payer: 59

## 2015-04-26 DIAGNOSIS — C7800 Secondary malignant neoplasm of unspecified lung: Secondary | ICD-10-CM | POA: Insufficient documentation

## 2015-04-26 DIAGNOSIS — Z09 Encounter for follow-up examination after completed treatment for conditions other than malignant neoplasm: Secondary | ICD-10-CM

## 2015-04-26 DIAGNOSIS — Z87891 Personal history of nicotine dependence: Secondary | ICD-10-CM | POA: Insufficient documentation

## 2015-04-26 DIAGNOSIS — I10 Essential (primary) hypertension: Secondary | ICD-10-CM | POA: Insufficient documentation

## 2015-04-26 DIAGNOSIS — C3490 Malignant neoplasm of unspecified part of unspecified bronchus or lung: Secondary | ICD-10-CM | POA: Diagnosis not present

## 2015-04-26 DIAGNOSIS — C3491 Malignant neoplasm of unspecified part of right bronchus or lung: Secondary | ICD-10-CM

## 2015-04-26 DIAGNOSIS — Z79899 Other long term (current) drug therapy: Secondary | ICD-10-CM | POA: Insufficient documentation

## 2015-04-26 HISTORY — PX: PORTACATH PLACEMENT: SHX2246

## 2015-04-26 HISTORY — DX: Gastro-esophageal reflux disease without esophagitis: K21.9

## 2015-04-26 SURGERY — INSERTION, TUNNELED CENTRAL VENOUS DEVICE, WITH PORT
Anesthesia: General | Laterality: Right | Wound class: Clean

## 2015-04-26 MED ORDER — FENTANYL CITRATE (PF) 100 MCG/2ML IJ SOLN
25.0000 ug | INTRAMUSCULAR | Status: DC | PRN
  Administered 2015-04-26 (×4): 25 ug via INTRAVENOUS

## 2015-04-26 MED ORDER — LACTATED RINGERS IV SOLN
INTRAVENOUS | Status: DC
  Administered 2015-04-26: 07:00:00 via INTRAVENOUS

## 2015-04-26 MED ORDER — VANCOMYCIN HCL IN DEXTROSE 1-5 GM/200ML-% IV SOLN
INTRAVENOUS | Status: AC
Start: 2015-04-26 — End: 2015-04-26
  Administered 2015-04-26: 1000 mg via INTRAVENOUS
  Filled 2015-04-26: qty 200

## 2015-04-26 MED ORDER — LIDOCAINE HCL (PF) 1 % IJ SOLN
INTRAMUSCULAR | Status: AC
  Filled 2015-04-26: qty 30

## 2015-04-26 MED ORDER — PHENYLEPHRINE HCL 10 MG/ML IJ SOLN
INTRAMUSCULAR | Status: DC | PRN
  Administered 2015-04-26 (×3): 100 ug via INTRAVENOUS

## 2015-04-26 MED ORDER — FENTANYL CITRATE (PF) 100 MCG/2ML IJ SOLN
INTRAMUSCULAR | Status: AC
  Administered 2015-04-26: 25 ug via INTRAVENOUS
  Filled 2015-04-26: qty 2

## 2015-04-26 MED ORDER — HEPARIN SODIUM (PORCINE) 5000 UNIT/ML IJ SOLN
INTRAMUSCULAR | Status: AC
  Filled 2015-04-26: qty 1

## 2015-04-26 MED ORDER — HEPARIN SODIUM (PORCINE) 1000 UNIT/ML IJ SOLN
INTRAMUSCULAR | Status: DC | PRN
  Administered 2015-04-26: 14 mL via INTRAMUSCULAR

## 2015-04-26 MED ORDER — TRAMADOL HCL 50 MG PO TABS
25.0000 mg | ORAL_TABLET | Freq: Four times a day (QID) | ORAL | Status: DC | PRN
Start: 2015-04-26 — End: 2015-05-18

## 2015-04-26 MED ORDER — PROPOFOL 10 MG/ML IV BOLUS
INTRAVENOUS | Status: DC | PRN
  Administered 2015-04-26: 150 mg via INTRAVENOUS

## 2015-04-26 MED ORDER — FENTANYL CITRATE (PF) 100 MCG/2ML IJ SOLN
INTRAMUSCULAR | Status: DC | PRN
  Administered 2015-04-26 (×4): 25 ug via INTRAVENOUS

## 2015-04-26 MED ORDER — LIDOCAINE HCL (CARDIAC) 20 MG/ML IV SOLN
INTRAVENOUS | Status: DC | PRN
  Administered 2015-04-26: 100 mg via INTRAVENOUS

## 2015-04-26 MED ORDER — LIDOCAINE HCL 1 % IJ SOLN
INTRAMUSCULAR | Status: DC | PRN
  Administered 2015-04-26: 10 mL via INTRADERMAL

## 2015-04-26 MED ORDER — ONDANSETRON HCL 4 MG/2ML IJ SOLN: 4.0000 mg | Freq: Once | INTRAMUSCULAR | Status: DC | PRN

## 2015-04-26 MED ORDER — ONDANSETRON HCL 4 MG/2ML IJ SOLN
INTRAMUSCULAR | Status: DC | PRN
  Administered 2015-04-26: 4 mg via INTRAVENOUS

## 2015-04-26 MED ORDER — GLYCOPYRROLATE 0.2 MG/ML IJ SOLN
INTRAMUSCULAR | Status: DC | PRN
  Administered 2015-04-26: 0.2 mg via INTRAVENOUS

## 2015-04-26 MED ORDER — VANCOMYCIN HCL IN DEXTROSE 1-5 GM/200ML-% IV SOLN
1000.0000 mg | INTRAVENOUS | Status: AC
Start: 2015-04-26 — End: 2015-04-26
  Administered 2015-04-26: 1000 mg via INTRAVENOUS

## 2015-04-26 SURGICAL SUPPLY — 38 items
BAG DECANTER STRL (MISCELLANEOUS) ×2 IMPLANT
BLADE SURG SZ11 CARB STEEL (BLADE) ×2 IMPLANT
CANISTER SUCT 1200ML W/VALVE (MISCELLANEOUS) ×2 IMPLANT
CHLORAPREP W/TINT 26ML (MISCELLANEOUS) ×2 IMPLANT
COVER LIGHT HANDLE STERIS (MISCELLANEOUS) ×4 IMPLANT
DRAPE C-ARM XRAY 36X54 (DRAPES) ×2 IMPLANT
DRESSING TELFA 4X3 1S ST N-ADH (GAUZE/BANDAGES/DRESSINGS) ×2 IMPLANT
DRSG TEGADERM 2-3/8X2-3/4 SM (GAUZE/BANDAGES/DRESSINGS) ×2 IMPLANT
DRSG TEGADERM 4X4.75 (GAUZE/BANDAGES/DRESSINGS) ×2 IMPLANT
DRSG TELFA 3X8 NADH (GAUZE/BANDAGES/DRESSINGS) ×2 IMPLANT
ELECT CAUTERY BLADE TIP 2.5 (TIP) ×2
ELECTRODE CAUTERY BLDE TIP 2.5 (TIP) ×1 IMPLANT
GLOVE EXAM LX STRL 7.5 (GLOVE) ×6 IMPLANT
GOWN STRL REUS W/ TWL LRG LVL3 (GOWN DISPOSABLE) ×2 IMPLANT
GOWN STRL REUS W/TWL LRG LVL3 (GOWN DISPOSABLE) ×6
IV NS 500ML (IV SOLUTION) ×2
IV NS 500ML BAXH (IV SOLUTION) ×1 IMPLANT
KIT RM TURNOVER STRD PROC AR (KITS) ×2 IMPLANT
LABEL OR SOLS (LABEL) ×2 IMPLANT
MARKER SKIN W/RULER 31145785 (MISCELLANEOUS) ×2 IMPLANT
NDL FILTER BLUNT 18X1 1/2 (NEEDLE) ×1 IMPLANT
NDL SAFETY 22GX1.5 (NEEDLE) ×2 IMPLANT
NEEDLE FILTER BLUNT 18X 1/2SAF (NEEDLE) ×1
NEEDLE FILTER BLUNT 18X1 1/2 (NEEDLE) ×1 IMPLANT
NS IRRIG 500ML POUR BTL (IV SOLUTION) ×2 IMPLANT
PACK PORT-A-CATH (MISCELLANEOUS) ×2 IMPLANT
PAD DRESSING TELFA 3X8 NADH (GAUZE/BANDAGES/DRESSINGS) IMPLANT
PAD GROUND ADULT SPLIT (MISCELLANEOUS) ×2 IMPLANT
PORTACATH POWER 8F (Port) ×2 IMPLANT
STRIP CLOSURE SKIN 1/2X4 (GAUZE/BANDAGES/DRESSINGS) ×1 IMPLANT
SUT ETHILON 4-0 (SUTURE) ×2
SUT ETHILON 4-0 FS2 18XMFL BLK (SUTURE) ×1
SUT PROLENE 2 0 SH DA (SUTURE) ×4 IMPLANT
SUT VIC AB 3-0 SH 27 (SUTURE) ×2
SUT VIC AB 3-0 SH 27X BRD (SUTURE) ×1 IMPLANT
SUTURE ETHLN 4-0 FS2 18XMF BLK (SUTURE) ×2 IMPLANT
SYR 3ML LL SCALE MARK (SYRINGE) ×2 IMPLANT
SYRINGE 10CC LL (SYRINGE) ×2 IMPLANT

## 2015-04-26 NOTE — Anesthesia Postprocedure Evaluation (Signed)
  Anesthesia Post-op Note  Patient: Patricia Kelley  Procedure(s) Performed: Procedure(s): INSERTION PORT-A-CATH (Right)  Anesthesia type:General  Patient location: PACU  Post pain: Pain level controlled  Post assessment: Post-op Vital signs reviewed, Patient's Cardiovascular Status Stable, Respiratory Function Stable, Patent Airway and No signs of Nausea or vomiting  Post vital signs: Reviewed and stable  Last Vitals:  Filed Vitals:   04/26/15 1002  BP: 114/67  Pulse:   Temp:   Resp: 16    Level of consciousness: awake, alert  and patient cooperative  Complications: No apparent anesthesia complications

## 2015-04-26 NOTE — Op Note (Signed)
04/26/2015  8:36 AM  PATIENT:  Thereasa Solo  59 y.o. female  PRE-OPERATIVE DIAGNOSIS:  metastatic lung cancer  POST-OPERATIVE DIAGNOSIS:  metastatic lung cancer  PROCEDURE:  Procedure(s): INSERTION PORT-A-CATH (Right) using ultrasound and fluoroscopy  SURGEON:  Surgeon(s) and Role:    * Nestor Lewandowsky, MD - Primary  ASSISTANTS: none   ANESTHESIA:   local and MAC  DICTATION:   The patient was brought to the operating suite and placed in the supine position. Using the ultrasound machine the right IJwas identified and suitable for cannulation. The patient was then prepped and draped in usual sterile fashion. The right IJ was percutaneously catheterized. A wire was placed into the venous system under fluoroscopic guidance. An appropriate site was selected on the chest wall and a Port-A-Cath pocket was created. The catheter was tunneled from the port site up to the insertion site. The catheter was then inserted through a peel-away sheath and positioned at the appropriate level in the superior vena cava. The catheter was then assembled and aspirated and flushed easily. It was then secured to the anterior chest wall with interrupted Prolene sutures. The catheter was flushed one last time and the wounds were then closed. The subcutaneous tissues were closed with running absorbable sutures and the skin with nylon. Sterile dressings were applied. Patient was then transported to the recovery room in stable condition.   Nestor Lewandowsky, MD

## 2015-04-26 NOTE — Transfer of Care (Signed)
Immediate Anesthesia Transfer of Care Note  Patient: Patricia Kelley  Procedure(s) Performed: Procedure(s): INSERTION PORT-A-CATH (Right)  Patient Location: PACU  Anesthesia Type:General  Level of Consciousness: awake, alert  and oriented  Airway & Oxygen Therapy: Patient Spontanous Breathing and Patient connected to face mask oxygen  Post-op Assessment: Report given to RN and Post -op Vital signs reviewed and stable  Post vital signs: Reviewed and stable  Last Vitals:  Filed Vitals:   04/26/15 0836  BP: 127/91  Pulse: 99  Temp: 36.4 C  Resp: 15    Complications: No apparent anesthesia complications

## 2015-04-26 NOTE — H&P (Signed)
I have seen and examined this patient and reviewed meds and history and physical.  All is ready for port insertion

## 2015-04-26 NOTE — Discharge Instructions (Signed)

## 2015-04-26 NOTE — Anesthesia Preprocedure Evaluation (Addendum)
Anesthesia Evaluation  Patient identified by MRN, date of birth, ID band Patient awake    Reviewed: Allergy & Precautions, NPO status , Patient's Chart, lab work & pertinent test results, reviewed documented beta blocker date and time   Airway Mallampati: II  TM Distance: >3 FB     Dental  (+) Chipped, Partial Lower   Pulmonary former smoker,          Cardiovascular hypertension, Pt. on medications     Neuro/Psych    GI/Hepatic GERD-  ,  Endo/Other    Renal/GU      Musculoskeletal   Abdominal   Peds  Hematology  (+) anemia ,   Anesthesia Other Findings Lung ca.  Reproductive/Obstetrics                            Anesthesia Physical Anesthesia Plan  ASA: III  Anesthesia Plan: General   Post-op Pain Management:    Induction: Intravenous  Airway Management Planned: LMA  Additional Equipment:   Intra-op Plan:   Post-operative Plan:   Informed Consent: I have reviewed the patients History and Physical, chart, labs and discussed the procedure including the risks, benefits and alternatives for the proposed anesthesia with the patient or authorized representative who has indicated his/her understanding and acceptance.     Plan Discussed with: CRNA  Anesthesia Plan Comments:         Anesthesia Quick Evaluation

## 2015-04-26 NOTE — Pre-Procedure Instructions (Signed)
I have reviewed the history and physical and there are no changes.  The patient understands the proposed procedure and all questions answered.  Nestor Lewandowsky, MD

## 2015-04-27 ENCOUNTER — Inpatient Hospital Stay: Payer: 59

## 2015-04-27 ENCOUNTER — Telehealth: Payer: Self-pay | Admitting: Pharmacist

## 2015-04-27 ENCOUNTER — Telehealth: Payer: Self-pay

## 2015-04-27 ENCOUNTER — Inpatient Hospital Stay (HOSPITAL_BASED_OUTPATIENT_CLINIC_OR_DEPARTMENT_OTHER): Payer: 59 | Admitting: Internal Medicine

## 2015-04-27 ENCOUNTER — Inpatient Hospital Stay: Payer: 59 | Attending: Internal Medicine

## 2015-04-27 VITALS — BP 126/86 | HR 76 | Temp 96.8°F | Resp 18 | Ht 64.0 in | Wt 167.5 lb

## 2015-04-27 DIAGNOSIS — C7802 Secondary malignant neoplasm of left lung: Secondary | ICD-10-CM | POA: Diagnosis not present

## 2015-04-27 DIAGNOSIS — R0609 Other forms of dyspnea: Secondary | ICD-10-CM

## 2015-04-27 DIAGNOSIS — C3490 Malignant neoplasm of unspecified part of unspecified bronchus or lung: Secondary | ICD-10-CM

## 2015-04-27 DIAGNOSIS — K219 Gastro-esophageal reflux disease without esophagitis: Secondary | ICD-10-CM

## 2015-04-27 DIAGNOSIS — C3491 Malignant neoplasm of unspecified part of right bronchus or lung: Secondary | ICD-10-CM

## 2015-04-27 DIAGNOSIS — M1990 Unspecified osteoarthritis, unspecified site: Secondary | ICD-10-CM

## 2015-04-27 DIAGNOSIS — J910 Malignant pleural effusion: Secondary | ICD-10-CM | POA: Diagnosis not present

## 2015-04-27 DIAGNOSIS — Z79899 Other long term (current) drug therapy: Secondary | ICD-10-CM

## 2015-04-27 DIAGNOSIS — E780 Pure hypercholesterolemia: Secondary | ICD-10-CM

## 2015-04-27 DIAGNOSIS — Z87891 Personal history of nicotine dependence: Secondary | ICD-10-CM

## 2015-04-27 DIAGNOSIS — Z5111 Encounter for antineoplastic chemotherapy: Secondary | ICD-10-CM | POA: Diagnosis not present

## 2015-04-27 DIAGNOSIS — C7931 Secondary malignant neoplasm of brain: Secondary | ICD-10-CM | POA: Diagnosis not present

## 2015-04-27 DIAGNOSIS — I10 Essential (primary) hypertension: Secondary | ICD-10-CM

## 2015-04-27 LAB — CBC WITH DIFFERENTIAL/PLATELET
BASOS PCT: 1 %
Basophils Absolute: 0 10*3/uL (ref 0–0.1)
Eosinophils Absolute: 0.1 10*3/uL (ref 0–0.7)
Eosinophils Relative: 2 %
HCT: 30.2 % — ABNORMAL LOW (ref 35.0–47.0)
Hemoglobin: 10.1 g/dL — ABNORMAL LOW (ref 12.0–16.0)
Lymphocytes Relative: 36 %
Lymphs Abs: 1.5 10*3/uL (ref 1.0–3.6)
MCH: 28.5 pg (ref 26.0–34.0)
MCHC: 33.5 g/dL (ref 32.0–36.0)
MCV: 84.9 fL (ref 80.0–100.0)
Monocytes Absolute: 0.6 10*3/uL (ref 0.2–0.9)
Monocytes Relative: 15 %
Neutro Abs: 1.9 10*3/uL (ref 1.4–6.5)
Neutrophils Relative %: 46 %
Platelets: 197 10*3/uL (ref 150–440)
RBC: 3.56 MIL/uL — ABNORMAL LOW (ref 3.80–5.20)
RDW: 18.5 % — ABNORMAL HIGH (ref 11.5–14.5)
WBC: 4.1 10*3/uL (ref 3.6–11.0)

## 2015-04-27 LAB — BASIC METABOLIC PANEL
Anion gap: 7 (ref 5–15)
BUN: 11 mg/dL (ref 6–20)
CHLORIDE: 101 mmol/L (ref 101–111)
CO2: 28 mmol/L (ref 22–32)
Calcium: 9.1 mg/dL (ref 8.9–10.3)
Creatinine, Ser: 0.63 mg/dL (ref 0.44–1.00)
GFR calc Af Amer: >60 mL/min (ref >60)
GFR calc non Af Amer: >60 mL/min (ref >60)
GLUCOSE: 111 mg/dL — AB (ref 65–99)
POTASSIUM: 3.5 mmol/L (ref 3.5–5.1)
SODIUM: 136 mmol/L (ref 135–145)

## 2015-04-27 MED ORDER — FUROSEMIDE 20 MG PO TABS: ORAL_TABLET | ORAL | Status: DC

## 2015-04-27 MED ORDER — SODIUM CHLORIDE 0.9 % IV SOLN
Freq: Once | INTRAVENOUS | Status: AC
  Administered 2015-04-27: 11:00:00 via INTRAVENOUS
  Filled 2015-04-27: qty 1000

## 2015-04-27 MED ORDER — SODIUM CHLORIDE 0.9 % IV SOLN
Freq: Once | INTRAVENOUS | Status: AC
  Administered 2015-04-27: 12:00:00 via INTRAVENOUS
  Filled 2015-04-27: qty 4

## 2015-04-27 MED ORDER — CYANOCOBALAMIN 1000 MCG/ML IJ SOLN
1000.0000 ug | Freq: Once | INTRAMUSCULAR | Status: AC
  Administered 2015-04-27: 1000 ug via INTRAMUSCULAR
  Filled 2015-04-27: qty 1

## 2015-04-27 MED ORDER — SODIUM CHLORIDE 0.9 % IJ SOLN
10.0000 mL | INTRAMUSCULAR | Status: AC | PRN
  Administered 2015-04-27: 10 mL via INTRAVENOUS
  Filled 2015-04-27: qty 10

## 2015-04-27 MED ORDER — SODIUM CHLORIDE 0.9 % IJ SOLN
10.0000 mL | INTRAMUSCULAR | Status: DC | PRN
  Filled 2015-04-27: qty 10

## 2015-04-27 MED ORDER — HEPARIN SOD (PORK) LOCK FLUSH 100 UNIT/ML IV SOLN
500.0000 [IU] | Freq: Once | INTRAVENOUS | Status: DC | PRN
  Filled 2015-04-27: qty 5

## 2015-04-27 MED ORDER — SODIUM CHLORIDE 0.9 % IV SOLN
500.0000 mg/m2 | Freq: Once | INTRAVENOUS | Status: AC
  Administered 2015-04-27: 925 mg via INTRAVENOUS
  Filled 2015-04-27: qty 37

## 2015-04-27 MED ORDER — MULTI-VITAMIN/MINERALS PO TABS: 1.0000 | ORAL_TABLET | Freq: Every day | ORAL | Status: AC

## 2015-04-27 MED ORDER — HEPARIN SOD (PORK) LOCK FLUSH 100 UNIT/ML IV SOLN
500.0000 [IU] | Freq: Once | INTRAVENOUS | Status: AC
  Administered 2015-04-27: 500 [IU] via INTRAVENOUS

## 2015-04-27 NOTE — Telephone Encounter (Signed)
Patient called in at this time and explains that where port was placed yesterday, the area is blistered.  Explained to patient that this happens sometimes with the adhesive. Pt has appointment at 0845am this morning with Dr. Ma Hillock.  Told patient that we would let Dr. Genevive Bi know about this problem and to also have Dr. Ma Hillock look at this area during appointment today.  Dr. Genevive Bi notified.

## 2015-04-27 NOTE — Progress Notes (Unsigned)
PICC line removed from left upper arm per Dr. Montine Circle.  Sterile technique used, catheter tip intact, no signs and symptoms of infection or swelling.  Clean bandage applied.

## 2015-04-27 NOTE — Telephone Encounter (Signed)
Patient to only get Alimta from now on, per Moishe Spice.

## 2015-04-30 ENCOUNTER — Other Ambulatory Visit: Payer: Self-pay | Admitting: *Deleted

## 2015-04-30 ENCOUNTER — Telehealth: Payer: Self-pay | Admitting: *Deleted

## 2015-04-30 MED ORDER — LIDOCAINE-PRILOCAINE 2.5-2.5 % EX CREA: TOPICAL_CREAM | CUTANEOUS | Status: DC

## 2015-04-30 NOTE — Telephone Encounter (Signed)
Asking if it is normal to still have numbness under her chin after having port inserted. Per Dr Ma Hillock, patietn is to watch it until Monday, if still has numbness, will order a spinal tap. Patient informed and repeated this back to me

## 2015-05-04 ENCOUNTER — Inpatient Hospital Stay: Payer: 59

## 2015-05-04 DIAGNOSIS — Z5111 Encounter for antineoplastic chemotherapy: Secondary | ICD-10-CM | POA: Diagnosis not present

## 2015-05-04 DIAGNOSIS — C3491 Malignant neoplasm of unspecified part of right bronchus or lung: Secondary | ICD-10-CM

## 2015-05-04 LAB — CBC WITH DIFFERENTIAL/PLATELET
Basophils Absolute: 0 10*3/uL (ref 0–0.1)
Basophils Relative: 1 %
EOS PCT: 1 %
Eosinophils Absolute: 0 10*3/uL (ref 0–0.7)
HCT: 33.2 % — ABNORMAL LOW (ref 35.0–47.0)
Hemoglobin: 11 g/dL — ABNORMAL LOW (ref 12.0–16.0)
LYMPHS ABS: 1 10*3/uL (ref 1.0–3.6)
LYMPHS PCT: 42 %
MCH: 28.1 pg (ref 26.0–34.0)
MCHC: 33.1 g/dL (ref 32.0–36.0)
MCV: 84.7 fL (ref 80.0–100.0)
MONO ABS: 0.2 10*3/uL (ref 0.2–0.9)
MONOS PCT: 7 %
Neutro Abs: 1.2 10*3/uL — ABNORMAL LOW (ref 1.4–6.5)
Neutrophils Relative %: 49 %
PLATELETS: 114 10*3/uL — AB (ref 150–440)
RBC: 3.92 MIL/uL (ref 3.80–5.20)
RDW: 17.3 % — AB (ref 11.5–14.5)
WBC: 2.4 10*3/uL — ABNORMAL LOW (ref 3.6–11.0)

## 2015-05-06 ENCOUNTER — Encounter: Payer: Self-pay | Admitting: Cardiothoracic Surgery

## 2015-05-06 ENCOUNTER — Inpatient Hospital Stay (HOSPITAL_BASED_OUTPATIENT_CLINIC_OR_DEPARTMENT_OTHER): Payer: 59 | Admitting: Cardiothoracic Surgery

## 2015-05-06 VITALS — BP 143/98 | HR 112 | Temp 96.6°F | Resp 16 | Wt 164.7 lb

## 2015-05-06 DIAGNOSIS — R918 Other nonspecific abnormal finding of lung field: Secondary | ICD-10-CM

## 2015-05-06 NOTE — Progress Notes (Signed)
This has to returns today in follow-up. Her only complaint is that she feels that her chin is slightly numb. She's had no fevers or chills. She has tolerated her chemotherapy well. They have used her port without any problems. On physical exam her wounds are all well healed. I removed her sutures in place Steri-Strips on the wound. She scheduled to come back next week for additional chemotherapy.

## 2015-05-09 NOTE — Progress Notes (Signed)
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  12/01/14 - Right Pleural Fluid Cytology. POSITIVE FOR MALIGNANT CELLS. ADENOCARCINOMA, CONSISTENT WITH LUNG ORIGIN. Molecular testing for EGFR, ALK and others is negative.  Patient started palliative chemotherapy with Alimta/carboplatin on 12/14/14, got cycle 6 on 04/06/15.[name]   HISTORY OF PRESENT ILLNESS:  Patient returns for continued oncology evaluation and plan next cycle chemotherapy. States that dyspnea on exertion is at baseline, denies any recent worsening. No progressive cough, right-sided chest pain or hemoptysis. No fever or chills. Eating well. Trying to stay active, uses walker for longer distances. No nausea, vomiting or diarrhea. No new pain issues. No falls or LOC.  REVIEW OF SYSTEMS:   ROS As in HPI above. In addition, no new headaches or focal weakness.  No new mood disturbances. No  sore throat or dysphagia. No hemoptysis or chest pain. No dizziness or palpitation. No abdominal pain, constipation, diarrhea, dysuria or hematuria. No new skin rash or bleeding symptoms. No new paresthesias in extremities. PS ECOG 2  PAST MEDICAL HISTORY: Reviewed Past Medical History  Diagnosis Date  . Lung cancer   . Hypertension   . Hypercholesteremia   . Metastatic lung cancer (metastasis from lung to other site) 01/21/2015  . Anemia 01/21/2015  . 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 GERD (gastroesophageal reflux disease)           Hypertension Osteoarthritis Breast lump removal in [place] about 10 years ago reportedly benign Partial hysterectomy 20 years ago,  denies malignancy Stage IV non-small cell right lung cancer with malignant effusion and left lung metastasis diagnosed in March 2016.   PAST SURGICAL HISTORY:Reviewed As above  FAMILY HISTORY:Reviewed Family History  Problem Relation Age of Onset  . Cervical cancer Mother   . Uterine cancer Maternal Grandmother     ADVANCED DIRECTIVES:  No - patient declined information   SOCIAL HISTORY: Reviewed Social History  Substance Use Topics  . Smoking status: Former Smoker -- 0.25 packs/day for 8 years    Types: Cigarettes    Quit date: 07/20/2014  . Smokeless tobacco: Not on file  . Alcohol Use: No    Allergies  Allergen Reactions  . Tegaderm Ag Mesh [Silver] Other (See Comments)    blisters    Current Outpatient Prescriptions  Medication Sig Dispense Refill  . ALPRAZolam (XANAX) 0.25 MG tablet Take 1 tablet (0.25 mg total) by mouth 3 (three) times daily as needed for anxiety. 90 tablet 1  . ferrous sulfate 325 (65 FE) MG tablet Take 325 mg by mouth daily with breakfast.    . lisinopril-hydrochlorothiazide (PRINZIDE,ZESTORETIC) 10-12.5 MG per tablet Take 1 tablet by mouth daily.    . Multiple Vitamins-Minerals (MULTIVITAMIN WITH MINERALS) tablet Take 1 tablet by mouth daily. 30 tablet 3  . omeprazole (PRILOSEC) 20 MG capsule Take 20 mg by mouth every morning.     . promethazine (PHENERGAN) 25 MG tablet Take 25 mg by mouth every 6 (six) hours as needed for nausea or vomiting.    . simvastatin (ZOCOR) 20 MG tablet Take 20 mg by mouth at bedtime.     . traMADol (ULTRAM) 50 MG tablet Take 0.5 tablets (25  mg total) by mouth every 6 (six) hours as needed. 30 tablet 0  . furosemide (LASIX) 20 MG tablet Take 1 tablet daily as needed for swelling. 30 tablet 2  . lidocaine-prilocaine (EMLA) cream Apply cream 1 hour before chemotherapy treatment 30 g 1   No current facility-administered medications for this visit.   Facility-Administered Medications Ordered in Other Visits    Medication Dose Route Frequency Provider Last Rate Last Dose  . heparin lock flush 100 unit/mL  250 Units Intracatheter PRN Evlyn Kanner, NP   250 Units at 01/21/15 1734  . heparin lock flush 100 unit/mL  500 Units Intracatheter Once PRN Leia Alf, MD      . sodium chloride 0.9 % injection 10 mL  10 mL Intracatheter PRN Evlyn Kanner, NP   10 mL at 01/21/15 1734  . sodium chloride 0.9 % injection 10 mL  10 mL Intravenous PRN Leia Alf, MD   10 mL at 04/27/15 0905  . sodium chloride 0.9 % injection 10 mL  10 mL Intracatheter PRN Leia Alf, MD      . sodium chloride 0.9 % injection 3 mL  3 mL Intracatheter PRN Evlyn Kanner, NP   3 mL at 01/21/15 1731    OBJECTIVE: Filed Vitals:   04/27/15 0930  BP: 126/86  Pulse: 76  Temp: 96.8 F (36 C)  Resp: 18     Body mass index is 28.75 kg/(m^2).    ECOG FS:2 - Symptomatic, <50% confined to bed  GENERAL: Alert and oriented and in no acute distress. No icterus. HEENT: EOMs intact. Oral exam negative for thrush.  CVS: S1S2, regular LUNGS: Bilaterally clear to auscultation, no creps or rhonchi. ABDOMEN: Soft, nontender.   NEURO: grossly nonfocal, cranial nerves are intact.  EXTREMITIES: No pedal edema.  LAB RESULTS: WBC 4.1, Hb 10.1, plt 197, ANC 1.9, Cr 0.63.    Component Value Date/Time   NA 136 04/27/2015 0901   NA 138 01/11/2015 0929   K 3.5 04/27/2015 0901   K 3.4* 01/11/2015 0929   CL 101 04/27/2015 0901   CL 104 01/11/2015 0929   CO2 28 04/27/2015 0901   CO2 25 01/11/2015 0929   GLUCOSE 111* 04/27/2015 0901   GLUCOSE 142* 01/11/2015 0929   BUN 11 04/27/2015 0901   BUN 9 01/11/2015 0929   CREATININE 0.63 04/27/2015 0901   CREATININE 0.80 01/11/2015 0929   CALCIUM 9.1 04/27/2015 0901   CALCIUM 7.9* 01/11/2015 0929   PROT 7.4 04/20/2015 1005   PROT 5.3* 12/30/2014 0519   ALBUMIN 4.1 04/20/2015 1005   ALBUMIN 1.9* 12/30/2014 0519   AST 34 04/20/2015 1005   AST 24 12/30/2014 0519   ALT 40 04/20/2015  1005   ALT 46 12/30/2014 0519   ALKPHOS 48 04/20/2015 1005   ALKPHOS 87 12/30/2014 0519   BILITOT 0.4 04/20/2015 1005   BILITOT 1.9* 12/30/2014 0519   GFRNONAA >60 04/27/2015 0901   GFRNONAA >60 01/11/2015 0929   GFRAA >60 04/27/2015 0901   GFRAA >60 01/11/2015 0929       STUDIES: 12/01/14 - Right Pleural Fluid Cytology. POSITIVE FOR MALIGNANT CELLS. ADENOCARCINOMA, CONSISTENT WITH LUNG ORIGIN.  12/11/14 - CT head. IMPRESSION: 1. Multiple bilateral enhancing mass lesions compatible with metastatic disease to the brain. MRI would be more sensitive for small lesions if delineation of the specific number of lesions is important for the patient's treatment. 2. No evidence for hemorrhage or significant mass effect.  12/30/14 - CT Chest. IMPRESSION: 1.  Decreased right hydropneumothorax with removal of right pleural catheter. Small residual subpulmonic fluid component persists with scattered foci of extrapleural air. Some improved aeration of the right lower lobe,, however diffuse lower lobe and perihilar right middle lobe consolidation persists. It is difficult to differentiate malignancy from atelectasis or pneumonia in the setting, however patient has had recent PET which better characterized this. 2. Diffuse pleural thickening and nodularity in the right hemithorax. Diffuse pulmonary metastatic disease.  03/08/15 - CT Chest. IMPRESSION:  1. Overall mild partial interval response to therapy. Tumor involving the right lower lobe appears decreased from previous exam with associated improved aeration to the right lung. 2. Numerous pulmonary nodules are again noted within both lungs consistent with metastatic disease. These appear stable in the interval. 3. Resolution of previous right supraclavicular and right paratracheal adenopathy. 4. Aortic atherosclerosis.  04/20/15 - CT scan of Chest.  IMPRESSION:  1. Mild partial interval treatment response. Decreased size of spiculated central right lower lobe  lung mass, likely the primary tumor. 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 Stable to mildly decreased pulmonary metastases.  2. Mildly increased small circumferential right pleural effusion.  3. Stable mild right hilar and right pericardiophrenic lymphadenopathy.  4. Stable indeterminate hypodense 1.2 cm left upper renal lesion.   ASSESSMENT / PLAN:    1. 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 On 12/01/14 - Right Pleural Fluid Cytology. POSITIVE FOR MALIGNANT CELLS. ADENOCARCINOMA, CONSISTENT WITH LUNG ORIGIN. Molecular testing for EGFR, ALK and others is negative. CT chest from 6/20 shows response to chemo  -  reviewed labs and recent CT chest from 8/2 and discussed with patient. Overall tolerating chemotherapy but having fatigue. Given partial response on CT scan, plan is to change to single agent Alimta treatment for continued palliative treatment. She is agreeable to this. Will proceed with cycle 1 single agent Alimta chemotherapy 500 mg/m2 IV. Monitor weekly labs and follow-up in 3 weeks to plan continued treatment.  2. 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 3. Pain - under control, continue to monitor. 4. Nutrition - encouraged to maintain adequate protein caloric intake.  5. In between visits, the patient has been advised to call or come to the ER in case of fevers, acute sickness or new symptoms. She is agreeable to this plan   Leia Alf, MD   05/09/2015 12:40 AM

## 2015-05-11 ENCOUNTER — Inpatient Hospital Stay: Payer: 59

## 2015-05-11 ENCOUNTER — Other Ambulatory Visit: Payer: Self-pay | Admitting: Internal Medicine

## 2015-05-11 DIAGNOSIS — Z5111 Encounter for antineoplastic chemotherapy: Secondary | ICD-10-CM | POA: Diagnosis not present

## 2015-05-11 DIAGNOSIS — C3490 Malignant neoplasm of unspecified part of unspecified bronchus or lung: Secondary | ICD-10-CM

## 2015-05-11 DIAGNOSIS — C3491 Malignant neoplasm of unspecified part of right bronchus or lung: Secondary | ICD-10-CM

## 2015-05-11 LAB — HEPATIC FUNCTION PANEL
ALBUMIN: 4 g/dL (ref 3.5–5.0)
ALK PHOS: 48 U/L (ref 38–126)
ALT: 51 U/L (ref 14–54)
AST: 43 U/L — ABNORMAL HIGH (ref 15–41)
BILIRUBIN TOTAL: 0.4 mg/dL (ref 0.3–1.2)
Total Protein: 7.3 g/dL (ref 6.5–8.1)

## 2015-05-11 LAB — CBC WITH DIFFERENTIAL/PLATELET
BASOS ABS: 0 10*3/uL (ref 0–0.1)
Basophils Relative: 0 %
Eosinophils Absolute: 0.1 10*3/uL (ref 0–0.7)
Eosinophils Relative: 1 %
HCT: 33.2 % — ABNORMAL LOW (ref 35.0–47.0)
Hemoglobin: 11.1 g/dL — ABNORMAL LOW (ref 12.0–16.0)
LYMPHS PCT: 34 %
Lymphs Abs: 1.7 10*3/uL (ref 1.0–3.6)
MCH: 28.4 pg (ref 26.0–34.0)
MCHC: 33.5 g/dL (ref 32.0–36.0)
MCV: 84.7 fL (ref 80.0–100.0)
Monocytes Absolute: 0.7 10*3/uL (ref 0.2–0.9)
Monocytes Relative: 14 %
Neutro Abs: 2.5 10*3/uL (ref 1.4–6.5)
Neutrophils Relative %: 51 %
Platelets: 248 10*3/uL (ref 150–440)
RBC: 3.91 MIL/uL (ref 3.80–5.20)
RDW: 17.3 % — AB (ref 11.5–14.5)
WBC: 4.9 10*3/uL (ref 3.6–11.0)

## 2015-05-11 LAB — BASIC METABOLIC PANEL
ANION GAP: 5 (ref 5–15)
BUN: 11 mg/dL (ref 6–20)
CO2: 29 mmol/L (ref 22–32)
CREATININE: 0.7 mg/dL (ref 0.44–1.00)
Calcium: 9 mg/dL (ref 8.9–10.3)
Chloride: 101 mmol/L (ref 101–111)
GFR calc Af Amer: >60 mL/min (ref >60)
Glucose, Bld: 132 mg/dL — ABNORMAL HIGH (ref 65–99)
Potassium: 3.4 mmol/L — ABNORMAL LOW (ref 3.5–5.1)
Sodium: 135 mmol/L (ref 135–145)

## 2015-05-11 MED ORDER — POTASSIUM CHLORIDE CRYS ER 10 MEQ PO TBCR: 10.0000 meq | EXTENDED_RELEASE_TABLET | Freq: Every day | ORAL | Status: DC

## 2015-05-18 ENCOUNTER — Inpatient Hospital Stay: Payer: 59

## 2015-05-18 ENCOUNTER — Inpatient Hospital Stay (HOSPITAL_BASED_OUTPATIENT_CLINIC_OR_DEPARTMENT_OTHER): Payer: 59 | Admitting: Internal Medicine

## 2015-05-18 VITALS — BP 126/84 | HR 66 | Temp 97.6°F | Resp 18 | Ht 64.0 in | Wt 164.9 lb

## 2015-05-18 DIAGNOSIS — I10 Essential (primary) hypertension: Secondary | ICD-10-CM

## 2015-05-18 DIAGNOSIS — C7802 Secondary malignant neoplasm of left lung: Secondary | ICD-10-CM

## 2015-05-18 DIAGNOSIS — C7931 Secondary malignant neoplasm of brain: Secondary | ICD-10-CM | POA: Diagnosis not present

## 2015-05-18 DIAGNOSIS — M1990 Unspecified osteoarthritis, unspecified site: Secondary | ICD-10-CM

## 2015-05-18 DIAGNOSIS — R0609 Other forms of dyspnea: Secondary | ICD-10-CM

## 2015-05-18 DIAGNOSIS — C3491 Malignant neoplasm of unspecified part of right bronchus or lung: Secondary | ICD-10-CM

## 2015-05-18 DIAGNOSIS — J910 Malignant pleural effusion: Secondary | ICD-10-CM

## 2015-05-18 DIAGNOSIS — E780 Pure hypercholesterolemia: Secondary | ICD-10-CM

## 2015-05-18 DIAGNOSIS — K219 Gastro-esophageal reflux disease without esophagitis: Secondary | ICD-10-CM

## 2015-05-18 DIAGNOSIS — Z5111 Encounter for antineoplastic chemotherapy: Secondary | ICD-10-CM | POA: Diagnosis not present

## 2015-05-18 DIAGNOSIS — Z87891 Personal history of nicotine dependence: Secondary | ICD-10-CM

## 2015-05-18 DIAGNOSIS — Z79899 Other long term (current) drug therapy: Secondary | ICD-10-CM

## 2015-05-18 LAB — CBC WITH DIFFERENTIAL/PLATELET
BASOS PCT: 1 %
Basophils Absolute: 0 10*3/uL (ref 0–0.1)
EOS ABS: 0.1 10*3/uL (ref 0–0.7)
Eosinophils Relative: 2 %
HCT: 33 % — ABNORMAL LOW (ref 35.0–47.0)
HEMOGLOBIN: 11 g/dL — AB (ref 12.0–16.0)
Lymphocytes Relative: 36 %
Lymphs Abs: 1.8 10*3/uL (ref 1.0–3.6)
MCH: 28 pg (ref 26.0–34.0)
MCHC: 33.3 g/dL (ref 32.0–36.0)
MCV: 84 fL (ref 80.0–100.0)
Monocytes Absolute: 0.7 10*3/uL (ref 0.2–0.9)
Monocytes Relative: 13 %
NEUTROS PCT: 48 %
Neutro Abs: 2.4 10*3/uL (ref 1.4–6.5)
Platelets: 292 10*3/uL (ref 150–440)
RBC: 3.92 MIL/uL (ref 3.80–5.20)
RDW: 17.6 % — ABNORMAL HIGH (ref 11.5–14.5)
WBC: 5 10*3/uL (ref 3.6–11.0)

## 2015-05-18 LAB — CREATININE, SERUM
CREATININE: 0.68 mg/dL (ref 0.44–1.00)
GFR calc Af Amer: >60 mL/min (ref >60)

## 2015-05-18 MED ORDER — CYANOCOBALAMIN 1000 MCG/ML IJ SOLN
1000.0000 ug | Freq: Once | INTRAMUSCULAR | Status: AC
  Administered 2015-05-18: 1000 ug via INTRAMUSCULAR
  Filled 2015-05-18: qty 1

## 2015-05-18 MED ORDER — AZITHROMYCIN 250 MG PO TABS: ORAL_TABLET | ORAL | Status: DC

## 2015-05-18 MED ORDER — SODIUM CHLORIDE 0.9 % IV SOLN
Freq: Once | INTRAVENOUS | Status: AC
  Administered 2015-05-18: 11:00:00 via INTRAVENOUS
  Filled 2015-05-18: qty 4

## 2015-05-18 MED ORDER — SODIUM CHLORIDE 0.9 % IV SOLN
500.0000 mg/m2 | Freq: Once | INTRAVENOUS | Status: AC
  Administered 2015-05-18: 925 mg via INTRAVENOUS
  Filled 2015-05-18: qty 37

## 2015-05-18 MED ORDER — SODIUM CHLORIDE 0.9 % IJ SOLN
10.0000 mL | INTRAMUSCULAR | Status: DC | PRN
  Administered 2015-05-18: 10 mL via INTRAVENOUS
  Filled 2015-05-18: qty 10

## 2015-05-18 MED ORDER — HEPARIN SOD (PORK) LOCK FLUSH 100 UNIT/ML IV SOLN
500.0000 [IU] | Freq: Once | INTRAVENOUS | Status: AC
  Administered 2015-05-18: 500 [IU] via INTRAVENOUS
  Filled 2015-05-18: qty 5

## 2015-05-18 MED ORDER — HYDROCODONE-ACETAMINOPHEN 5-325 MG PO TABS: 1.0000 | ORAL_TABLET | Freq: Four times a day (QID) | ORAL | Status: DC | PRN

## 2015-05-18 MED ORDER — SODIUM CHLORIDE 0.9 % IV SOLN
Freq: Once | INTRAVENOUS | Status: AC
  Administered 2015-05-18: 11:00:00 via INTRAVENOUS
  Filled 2015-05-18: qty 1000

## 2015-05-18 NOTE — Progress Notes (Signed)
Pt was trimming her toenails and noticed a part of nail that was not looking right. She has hole in her nail, she also states she had HA a lot, feels stopped up, pain in her ears and pressure, hears ringing in her ears, feels like she needs to have ENT consult. Also she states that when she is in bed and turns on her side the spot she has the tube placed gives her burning sensation.  She also has pain in her neck from her bone spurs.

## 2015-05-25 ENCOUNTER — Inpatient Hospital Stay: Payer: 59 | Attending: Internal Medicine

## 2015-05-25 DIAGNOSIS — Z79899 Other long term (current) drug therapy: Secondary | ICD-10-CM | POA: Insufficient documentation

## 2015-05-25 DIAGNOSIS — J910 Malignant pleural effusion: Secondary | ICD-10-CM | POA: Diagnosis not present

## 2015-05-25 DIAGNOSIS — M1990 Unspecified osteoarthritis, unspecified site: Secondary | ICD-10-CM | POA: Diagnosis not present

## 2015-05-25 DIAGNOSIS — C7931 Secondary malignant neoplasm of brain: Secondary | ICD-10-CM | POA: Insufficient documentation

## 2015-05-25 DIAGNOSIS — Z5111 Encounter for antineoplastic chemotherapy: Secondary | ICD-10-CM | POA: Insufficient documentation

## 2015-05-25 DIAGNOSIS — K219 Gastro-esophageal reflux disease without esophagitis: Secondary | ICD-10-CM | POA: Insufficient documentation

## 2015-05-25 DIAGNOSIS — Z87891 Personal history of nicotine dependence: Secondary | ICD-10-CM | POA: Insufficient documentation

## 2015-05-25 DIAGNOSIS — I10 Essential (primary) hypertension: Secondary | ICD-10-CM | POA: Insufficient documentation

## 2015-05-25 DIAGNOSIS — C3491 Malignant neoplasm of unspecified part of right bronchus or lung: Secondary | ICD-10-CM | POA: Diagnosis not present

## 2015-05-25 DIAGNOSIS — C7802 Secondary malignant neoplasm of left lung: Secondary | ICD-10-CM | POA: Insufficient documentation

## 2015-05-25 DIAGNOSIS — E780 Pure hypercholesterolemia: Secondary | ICD-10-CM | POA: Diagnosis not present

## 2015-05-25 LAB — CBC WITH DIFFERENTIAL/PLATELET
Basophils Absolute: 0 10*3/uL (ref 0–0.1)
Basophils Relative: 1 %
Eosinophils Absolute: 0.1 10*3/uL (ref 0–0.7)
Eosinophils Relative: 3 %
HEMATOCRIT: 34.9 % — AB (ref 35.0–47.0)
HEMOGLOBIN: 11.7 g/dL — AB (ref 12.0–16.0)
LYMPHS ABS: 1.4 10*3/uL (ref 1.0–3.6)
Lymphocytes Relative: 50 %
MCH: 28.1 pg (ref 26.0–34.0)
MCHC: 33.6 g/dL (ref 32.0–36.0)
MCV: 83.5 fL (ref 80.0–100.0)
MONOS PCT: 4 %
Monocytes Absolute: 0.1 10*3/uL — ABNORMAL LOW (ref 0.2–0.9)
NEUTROS ABS: 1.1 10*3/uL — AB (ref 1.4–6.5)
NEUTROS PCT: 42 %
Platelets: 142 10*3/uL — ABNORMAL LOW (ref 150–440)
RBC: 4.17 MIL/uL (ref 3.80–5.20)
RDW: 16.4 % — ABNORMAL HIGH (ref 11.5–14.5)
WBC: 2.7 10*3/uL — ABNORMAL LOW (ref 3.6–11.0)

## 2015-05-27 ENCOUNTER — Telehealth: Payer: Self-pay | Admitting: *Deleted

## 2015-05-27 DIAGNOSIS — C3491 Malignant neoplasm of unspecified part of right bronchus or lung: Secondary | ICD-10-CM

## 2015-05-27 NOTE — Telephone Encounter (Signed)
Asking for another Z pak, states it is helping, but thinks she need another, still has eye swelling and nasal congestion. Also requesting prescription for sleep med. She states hse is ok to wait until tomorrow for a response

## 2015-05-28 MED ORDER — AZITHROMYCIN 250 MG PO TABS: ORAL_TABLET | ORAL | Status: DC

## 2015-05-28 MED ORDER — ZOLPIDEM TARTRATE 5 MG PO TABS: 5.0000 mg | ORAL_TABLET | Freq: Every evening | ORAL | Status: DC | PRN

## 2015-05-28 NOTE — Telephone Encounter (Signed)
1. Please call in a Z-pak. 2. Please call in Ambien 5 mg PO qHS prn insomnia (#30, 1 refill). Thanks.

## 2015-05-28 NOTE — Telephone Encounter (Signed)
Patient informed that rx has been called topharmacy. She thanked me

## 2015-06-01 ENCOUNTER — Inpatient Hospital Stay: Payer: 59

## 2015-06-01 ENCOUNTER — Other Ambulatory Visit: Payer: Self-pay | Admitting: Internal Medicine

## 2015-06-01 DIAGNOSIS — Z5111 Encounter for antineoplastic chemotherapy: Secondary | ICD-10-CM | POA: Diagnosis not present

## 2015-06-01 DIAGNOSIS — C3491 Malignant neoplasm of unspecified part of right bronchus or lung: Secondary | ICD-10-CM

## 2015-06-01 DIAGNOSIS — C3490 Malignant neoplasm of unspecified part of unspecified bronchus or lung: Secondary | ICD-10-CM

## 2015-06-01 LAB — HEPATIC FUNCTION PANEL
ALBUMIN: 4 g/dL (ref 3.5–5.0)
ALT: 80 U/L — ABNORMAL HIGH (ref 14–54)
AST: 52 U/L — AB (ref 15–41)
Alkaline Phosphatase: 49 U/L (ref 38–126)
Bilirubin, Direct: <0.1 mg/dL — ABNORMAL LOW (ref 0.1–0.5)
TOTAL PROTEIN: 7.3 g/dL (ref 6.5–8.1)
Total Bilirubin: 0.5 mg/dL (ref 0.3–1.2)

## 2015-06-01 LAB — BASIC METABOLIC PANEL
ANION GAP: 6 (ref 5–15)
BUN: 15 mg/dL (ref 6–20)
CHLORIDE: 102 mmol/L (ref 101–111)
CO2: 25 mmol/L (ref 22–32)
CREATININE: 0.65 mg/dL (ref 0.44–1.00)
Calcium: 8.9 mg/dL (ref 8.9–10.3)
GFR calc non Af Amer: >60 mL/min (ref >60)
Glucose, Bld: 146 mg/dL — ABNORMAL HIGH (ref 65–99)
POTASSIUM: 3.4 mmol/L — AB (ref 3.5–5.1)
SODIUM: 133 mmol/L — AB (ref 135–145)

## 2015-06-01 LAB — CBC WITH DIFFERENTIAL/PLATELET
BASOS ABS: 0 10*3/uL (ref 0–0.1)
BASOS PCT: 0 %
EOS ABS: 0.1 10*3/uL (ref 0–0.7)
Eosinophils Relative: 2 %
HCT: 33.5 % — ABNORMAL LOW (ref 35.0–47.0)
Hemoglobin: 11.1 g/dL — ABNORMAL LOW (ref 12.0–16.0)
Lymphocytes Relative: 26 %
Lymphs Abs: 1.4 10*3/uL (ref 1.0–3.6)
MCH: 28 pg (ref 26.0–34.0)
MCHC: 33.2 g/dL (ref 32.0–36.0)
MCV: 84.6 fL (ref 80.0–100.0)
MONO ABS: 0.8 10*3/uL (ref 0.2–0.9)
MONOS PCT: 15 %
Neutro Abs: 3.1 10*3/uL (ref 1.4–6.5)
Neutrophils Relative %: 57 %
PLATELETS: 193 10*3/uL (ref 150–440)
RBC: 3.96 MIL/uL (ref 3.80–5.20)
RDW: 16.1 % — AB (ref 11.5–14.5)
WBC: 5.4 10*3/uL (ref 3.6–11.0)

## 2015-06-01 MED ORDER — POTASSIUM CHLORIDE CRYS ER 10 MEQ PO TBCR: 10.0000 meq | EXTENDED_RELEASE_TABLET | Freq: Every day | ORAL | Status: DC

## 2015-06-02 NOTE — Progress Notes (Signed)
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  12/01/14 - Right Pleural Fluid Cytology. POSITIVE FOR MALIGNANT CELLS. ADENOCARCINOMA, CONSISTENT WITH LUNG ORIGIN. Molecular testing for EGFR, ALK and others is negative.  Patient started palliative chemotherapy with Alimta/carboplatin on 12/14/14, got cycle 6 on 04/06/15. Then on single agent maintenance Alimta.Patricia Kelley   HISTORY OF PRESENT ILLNESS:  Patient returns for continued oncology evaluation and plan next cycle chemotherapy. States that she is doing steady, dyspnea on exertion is at baseline. No progressive cough, right-sided chest pain or hemoptysis. No fever or chills. Eating well. Trying to stay active, uses walker for longer distances. No nausea, vomiting or diarrhea. No new pain issues. No falls or LOC.  REVIEW OF SYSTEMS:   ROS As in HPI above. In addition, no fevers. No new headaches or focal weakness.  No new mood disturbances. No  sore throat or dysphagia. No hemoptysis or chest pain. No dizziness or palpitation. No abdominal pain, constipation, diarrhea, dysuria or hematuria. No new skin rash or bleeding symptoms. No new paresthesias in extremities. PS ECOG 2  PAST MEDICAL HISTORY: Reviewed Past Medical History  Diagnosis Date  . Lung cancer   . Hypertension   . Hypercholesteremia   . Metastatic lung cancer (metastasis from lung to other site) 01/21/2015  . Anemia 01/21/2015  . 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 GERD (gastroesophageal reflux disease)           Hypertension Osteoarthritis Breast lump removal in [place] about 10 years ago reportedly  benign Partial hysterectomy 20 years ago, denies malignancy  Stage IV non-small cell right lung cancer with malignant effusion and left lung metastasis diagnosed in March 2016.   PAST SURGICAL HISTORY:Reviewed As above  FAMILY HISTORY:Reviewed Family History  Problem Relation Age of Onset  . Cervical cancer Mother   . Uterine cancer Maternal Grandmother     SOCIAL HISTORY: Reviewed Social History  Substance Use Topics  . Smoking status: Former Smoker -- 0.25 packs/day for 8 years    Types: Cigarettes    Quit date: 07/20/2014  . Smokeless tobacco: Not on file  . Alcohol Use: No    Allergies  Allergen Reactions  . Tegaderm Ag Mesh [Silver] Other (See Comments)    blisters    Current Outpatient Prescriptions  Medication Sig Dispense Refill  . ALPRAZolam (XANAX) 0.25 MG tablet Take 1 tablet (0.25 mg total) by mouth 3 (three) times daily as needed for anxiety. 90 tablet 1  . dronabinol (MARINOL) 2.5 MG capsule Take 2.5 mg by mouth daily.    . ferrous sulfate 325 (65 FE) MG tablet Take 325 mg by mouth daily with breakfast.    . lidocaine-prilocaine (EMLA) cream Apply cream 1 hour before chemotherapy treatment 30 g 1  . lisinopril-hydrochlorothiazide (PRINZIDE,ZESTORETIC) 10-12.5 MG per tablet Take 1 tablet by mouth daily.    . Multiple Vitamins-Minerals (MULTIVITAMIN WITH MINERALS) tablet Take 1 tablet by mouth daily. 30 tablet 3  . omeprazole (PRILOSEC) 20 MG capsule Take 20 mg by mouth every morning.     . promethazine (PHENERGAN) 25 MG tablet Take 25 mg by mouth every 6 (six) hours as needed for nausea or vomiting.    Patricia Kelley  simvastatin (ZOCOR) 20 MG tablet Take 20 mg by mouth at bedtime.     Patricia Kelley azithromycin (ZITHROMAX Z-PAK) 250 MG tablet Take 2 tablets (500 mg) today, then 250 mg (1 tablet) daily for 4 days. 6 each 0  . furosemide (LASIX) 20 MG tablet Take 1 tablet daily as needed for swelling. (Patient not taking: Reported on 05/18/2015) 30 tablet 2  .  HYDROcodone-acetaminophen (NORCO) 5-325 MG per tablet Take 1 tablet by mouth every 6 (six) hours as needed for moderate pain. 90 tablet 0  . potassium chloride (K-DUR,KLOR-CON) 10 MEQ tablet Take 1 tablet (10 mEq total) by mouth daily. 8 tablet 0  . zolpidem (AMBIEN) 5 MG tablet Take 1 tablet (5 mg total) by mouth at bedtime as needed for sleep. 30 tablet 1   No current facility-administered medications for this visit.   Facility-Administered Medications Ordered in Other Visits  Medication Dose Route Frequency Provider Last Rate Last Dose  . heparin lock flush 100 unit/mL  250 Units Intracatheter PRN Evlyn Kanner, NP   250 Units at 01/21/15 1734  . heparin lock flush 100 unit/mL  500 Units Intracatheter Once PRN Leia Alf, MD      . sodium chloride 0.9 % injection 10 mL  10 mL Intracatheter PRN Evlyn Kanner, NP   10 mL at 01/21/15 1734  . sodium chloride 0.9 % injection 10 mL  10 mL Intravenous PRN Leia Alf, MD   10 mL at 04/27/15 0905  . sodium chloride 0.9 % injection 10 mL  10 mL Intracatheter PRN Leia Alf, MD      . sodium chloride 0.9 % injection 3 mL  3 mL Intracatheter PRN Evlyn Kanner, NP   3 mL at 01/21/15 1731    OBJECTIVE: Filed Vitals:   05/18/15 1001  BP: 126/84  Pulse: 66  Temp: 97.6 F (36.4 C)  Resp: 18     Body mass index is 28.29 kg/(m^2).    ECOG FS:2 - Symptomatic, <50% confined to bed  GENERAL: patient is alert and oriented and in no acute distress. No icterus. HEENT: EOMs intact. Oral exam negative for thrush.  CVS: S1S2, regular LUNGS: Bilaterally clear to auscultation, no creps   ABDOMEN: Soft, nontender.   NEURO: grossly nonfocal, cranial nerves are intact.  EXTREMITIES: No pedal edema.  LAB RESULTS: WBC 5, Hb 11, plt 292, ANC 2.4, Cr 0.68.    Component Value Date/Time   NA 133* 06/01/2015 1059   NA 138 01/11/2015 0929   K 3.4* 06/01/2015 1059   K 3.4* 01/11/2015 0929   CL 102 06/01/2015 1059   CL 104 01/11/2015 0929    CO2 25 06/01/2015 1059   CO2 25 01/11/2015 0929   GLUCOSE 146* 06/01/2015 1059   GLUCOSE 142* 01/11/2015 0929   BUN 15 06/01/2015 1059   BUN 9 01/11/2015 0929   CREATININE 0.65 06/01/2015 1059   CREATININE 0.80 01/11/2015 0929   CALCIUM 8.9 06/01/2015 1059   CALCIUM 7.9* 01/11/2015 0929   PROT 7.3 06/01/2015 1059   PROT 5.3* 12/30/2014 0519   ALBUMIN 4.0 06/01/2015 1059   ALBUMIN 1.9* 12/30/2014 0519   AST 52* 06/01/2015 1059   AST 24 12/30/2014 0519   ALT 80* 06/01/2015 1059   ALT 46 12/30/2014 0519   ALKPHOS 49 06/01/2015 1059   ALKPHOS 87 12/30/2014 0519   BILITOT 0.5 06/01/2015 1059   BILITOT 1.9* 12/30/2014 0519   GFRNONAA >60 06/01/2015 1059   GFRNONAA >60 01/11/2015 5993  GFRAA >60 06/01/2015 1059   GFRAA >60 01/11/2015 0929       STUDIES: 12/01/14 - Right Pleural Fluid Cytology. POSITIVE FOR MALIGNANT CELLS. ADENOCARCINOMA, CONSISTENT WITH LUNG ORIGIN.  12/11/14 - CT head. IMPRESSION: 1. Multiple bilateral enhancing mass lesions compatible with metastatic disease to the brain. MRI would be more sensitive for small lesions if delineation of the specific number of lesions is important for the patient's treatment. 2. No evidence for hemorrhage or significant mass effect.  12/30/14 - CT Chest. IMPRESSION: 1. Decreased right hydropneumothorax with removal of right pleural catheter. Small residual subpulmonic fluid component persists with scattered foci of extrapleural air. Some improved aeration of the right lower lobe,, however diffuse lower lobe and perihilar right middle lobe consolidation persists. It is difficult to differentiate malignancy from atelectasis or pneumonia in the setting, however patient has had recent PET which better characterized this. 2. Diffuse pleural thickening and nodularity in the right hemithorax. Diffuse pulmonary metastatic disease.  03/08/15 - CT Chest. IMPRESSION:  1. Overall mild partial interval response to therapy. Tumor involving the right  lower lobe appears decreased from previous exam with associated improved aeration to the right lung. 2. Numerous pulmonary nodules are again noted within both lungs consistent with metastatic disease. These appear stable in the interval. 3. Resolution of previous right supraclavicular and right paratracheal adenopathy. 4. Aortic atherosclerosis.  04/20/15 - CT scan of Chest.  IMPRESSION:  1. Mild partial interval treatment response. Decreased size of spiculated central right lower lobe lung mass, likely the primary tumor. 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 Stable to mildly decreased pulmonary metastases.  2. Mildly increased small circumferential right pleural effusion.  3. Stable mild right hilar and right pericardiophrenic lymphadenopathy.  4. Stable indeterminate hypodense 1.2 cm left upper renal lesion.   ASSESSMENT / PLAN:    1. 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 On 12/01/14 - Right Pleural Fluid Cytology. POSITIVE FOR MALIGNANT CELLS. ADENOCARCINOMA, CONSISTENT WITH LUNG ORIGIN. Molecular testing for EGFR, ALK and others is negative. CT chest from 6/20 showed response to chemo  -  reviewed labs and recent CT chest from 8/2 and discussed with patient. Tolerating chemotherapy except for fatigue. Plan is to continue on single agent Alimta treatment for continued palliative treatment. She is agreeable to this. Will proceed with cycle 2 single agent Alimta chemotherapy 500 mg/m2 IV. Monitor weekly labs and follow-up in 3 weeks to plan continued treatment.  2. 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 3. Pain - change to Norco prn and continue to monitor. 4. Nutrition - encouraged to maintain adequate protein caloric intake.  5. In between  visits, the patient has been advised to call or come to the ER in case of fevers, acute sickness or new symptoms. She is              agreeable to this plan   Leia Alf, MD   06/02/2015 8:21 PM

## 2015-06-08 ENCOUNTER — Inpatient Hospital Stay: Payer: 59

## 2015-06-08 ENCOUNTER — Inpatient Hospital Stay (HOSPITAL_BASED_OUTPATIENT_CLINIC_OR_DEPARTMENT_OTHER): Payer: 59 | Admitting: Oncology

## 2015-06-08 VITALS — BP 116/78 | HR 82 | Temp 98.2°F | Ht 65.0 in | Wt 164.6 lb

## 2015-06-08 DIAGNOSIS — C801 Malignant (primary) neoplasm, unspecified: Secondary | ICD-10-CM

## 2015-06-08 DIAGNOSIS — K219 Gastro-esophageal reflux disease without esophagitis: Secondary | ICD-10-CM

## 2015-06-08 DIAGNOSIS — C3491 Malignant neoplasm of unspecified part of right bronchus or lung: Secondary | ICD-10-CM

## 2015-06-08 DIAGNOSIS — Z5111 Encounter for antineoplastic chemotherapy: Secondary | ICD-10-CM | POA: Diagnosis not present

## 2015-06-08 DIAGNOSIS — R0609 Other forms of dyspnea: Secondary | ICD-10-CM

## 2015-06-08 DIAGNOSIS — Z79899 Other long term (current) drug therapy: Secondary | ICD-10-CM

## 2015-06-08 DIAGNOSIS — E780 Pure hypercholesterolemia: Secondary | ICD-10-CM

## 2015-06-08 DIAGNOSIS — C7931 Secondary malignant neoplasm of brain: Secondary | ICD-10-CM | POA: Diagnosis not present

## 2015-06-08 DIAGNOSIS — I10 Essential (primary) hypertension: Secondary | ICD-10-CM

## 2015-06-08 DIAGNOSIS — J910 Malignant pleural effusion: Secondary | ICD-10-CM

## 2015-06-08 DIAGNOSIS — M1990 Unspecified osteoarthritis, unspecified site: Secondary | ICD-10-CM

## 2015-06-08 DIAGNOSIS — C7802 Secondary malignant neoplasm of left lung: Secondary | ICD-10-CM | POA: Diagnosis not present

## 2015-06-08 DIAGNOSIS — Z87891 Personal history of nicotine dependence: Secondary | ICD-10-CM

## 2015-06-08 LAB — CBC WITH DIFFERENTIAL/PLATELET
Basophils Absolute: 0.1 10*3/uL (ref 0–0.1)
Basophils Relative: 1 %
Eosinophils Absolute: 0.1 10*3/uL (ref 0–0.7)
Eosinophils Relative: 2 %
HCT: 33.4 % — ABNORMAL LOW (ref 35.0–47.0)
HEMOGLOBIN: 11 g/dL — AB (ref 12.0–16.0)
LYMPHS ABS: 1.5 10*3/uL (ref 1.0–3.6)
LYMPHS PCT: 28 %
MCH: 28 pg (ref 26.0–34.0)
MCHC: 32.9 g/dL (ref 32.0–36.0)
MCV: 85.1 fL (ref 80.0–100.0)
Monocytes Absolute: 0.8 10*3/uL (ref 0.2–0.9)
Monocytes Relative: 14 %
NEUTROS ABS: 3.1 10*3/uL (ref 1.4–6.5)
NEUTROS PCT: 55 %
Platelets: 269 10*3/uL (ref 150–440)
RBC: 3.92 MIL/uL (ref 3.80–5.20)
RDW: 16.2 % — ABNORMAL HIGH (ref 11.5–14.5)
WBC: 5.5 10*3/uL (ref 3.6–11.0)

## 2015-06-08 LAB — BASIC METABOLIC PANEL
ANION GAP: 6 (ref 5–15)
BUN: 13 mg/dL (ref 6–20)
CHLORIDE: 104 mmol/L (ref 101–111)
CO2: 29 mmol/L (ref 22–32)
Calcium: 9.4 mg/dL (ref 8.9–10.3)
Creatinine, Ser: 0.6 mg/dL (ref 0.44–1.00)
GFR calc Af Amer: >60 mL/min (ref >60)
GFR calc non Af Amer: >60 mL/min (ref >60)
GLUCOSE: 116 mg/dL — AB (ref 65–99)
POTASSIUM: 3.3 mmol/L — AB (ref 3.5–5.1)
Sodium: 139 mmol/L (ref 135–145)

## 2015-06-08 MED ORDER — CYANOCOBALAMIN 1000 MCG/ML IJ SOLN
1000.0000 ug | Freq: Once | INTRAMUSCULAR | Status: AC
  Administered 2015-06-08: 1000 ug via INTRAMUSCULAR
  Filled 2015-06-08: qty 1

## 2015-06-08 MED ORDER — SODIUM CHLORIDE 0.9 % IV SOLN
Freq: Once | INTRAVENOUS | Status: AC
  Administered 2015-06-08: 11:00:00 via INTRAVENOUS
  Filled 2015-06-08: qty 4

## 2015-06-08 MED ORDER — HEPARIN SOD (PORK) LOCK FLUSH 100 UNIT/ML IV SOLN
500.0000 [IU] | Freq: Once | INTRAVENOUS | Status: AC | PRN
  Administered 2015-06-08: 500 [IU]
  Filled 2015-06-08: qty 5

## 2015-06-08 MED ORDER — SODIUM CHLORIDE 0.9 % IJ SOLN
10.0000 mL | INTRAMUSCULAR | Status: DC | PRN
  Administered 2015-06-08: 10 mL
  Filled 2015-06-08: qty 10

## 2015-06-08 MED ORDER — ALPRAZOLAM 0.5 MG PO TABS: 0.5000 mg | ORAL_TABLET | Freq: Every evening | ORAL | Status: DC | PRN

## 2015-06-08 MED ORDER — SODIUM CHLORIDE 0.9 % IV SOLN
Freq: Once | INTRAVENOUS | Status: AC
  Administered 2015-06-08: 11:00:00 via INTRAVENOUS
  Filled 2015-06-08: qty 1000

## 2015-06-08 MED ORDER — SODIUM CHLORIDE 0.9 % IV SOLN
500.0000 mg/m2 | Freq: Once | INTRAVENOUS | Status: AC
  Administered 2015-06-08: 925 mg via INTRAVENOUS
  Filled 2015-06-08: qty 37

## 2015-06-08 NOTE — Progress Notes (Signed)
Patient here for follow up. Complaints of insomnia would like to adjust her xanax dose.

## 2015-06-09 ENCOUNTER — Telehealth: Payer: Self-pay | Admitting: *Deleted

## 2015-06-09 MED ORDER — POTASSIUM CHLORIDE ER 10 MEQ PO TBCR: EXTENDED_RELEASE_TABLET | ORAL | Status: DC

## 2015-06-09 NOTE — Telephone Encounter (Signed)
Called pt to let her know potassium level low. She picked up potassium already but it was really the potassium from 9/13 and she states she has taken 5 of the 8 pills.  I told her that I was calling in 36 pills.  The first 6 days dr pandit wants her to take one daily but after 6th day she needs to only take a pill if she takes lasix pill. She verbalizes understanding and rx sent to her pharmacy.

## 2015-06-15 ENCOUNTER — Inpatient Hospital Stay: Payer: 59

## 2015-06-15 DIAGNOSIS — C3491 Malignant neoplasm of unspecified part of right bronchus or lung: Secondary | ICD-10-CM

## 2015-06-15 DIAGNOSIS — Z5111 Encounter for antineoplastic chemotherapy: Secondary | ICD-10-CM | POA: Diagnosis not present

## 2015-06-15 LAB — CBC WITH DIFFERENTIAL/PLATELET
BASOS ABS: 0.1 10*3/uL (ref 0–0.1)
BASOS PCT: 2 %
EOS ABS: 0.1 10*3/uL (ref 0–0.7)
Eosinophils Relative: 4 %
HEMATOCRIT: 35.2 % (ref 35.0–47.0)
HEMOGLOBIN: 11.7 g/dL — AB (ref 12.0–16.0)
Lymphocytes Relative: 37 %
Lymphs Abs: 1.2 10*3/uL (ref 1.0–3.6)
MCH: 28 pg (ref 26.0–34.0)
MCHC: 33.3 g/dL (ref 32.0–36.0)
MCV: 84 fL (ref 80.0–100.0)
MONOS PCT: 8 %
Monocytes Absolute: 0.3 10*3/uL (ref 0.2–0.9)
NEUTROS ABS: 1.5 10*3/uL (ref 1.4–6.5)
NEUTROS PCT: 49 %
Platelets: 154 10*3/uL (ref 150–440)
RBC: 4.19 MIL/uL (ref 3.80–5.20)
RDW: 15.2 % — ABNORMAL HIGH (ref 11.5–14.5)
WBC: 3.1 10*3/uL — AB (ref 3.6–11.0)

## 2015-06-15 NOTE — Progress Notes (Signed)
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  12/01/14 - Right Pleural Fluid Cytology. POSITIVE FOR MALIGNANT CELLS. ADENOCARCINOMA, CONSISTENT WITH LUNG ORIGIN. Molecular testing for EGFR, ALK and others is negative.  Patient started palliative chemotherapy with Alimta/carboplatin on 12/14/14, got cycle 6 on 04/06/15. Then on single agent maintenance Alimta.[name]   HISTORY OF PRESENT ILLNESS:  Patient returns for further evaluation and consideration of her next infusion of pemetrexed. She is tolerating her treatments well without significant side effects. She denies any recent fevers. She has no neurologic complaints. She continues to have dyspnea on exertion which is unchanged. She denies any chest pain. She denies any nausea, vomiting, constipation, or diarrhea. Patient offers no further specific complaints today.    REVIEW OF SYSTEMS:   Review of Systems  Constitutional: Negative for fever and malaise/fatigue.  Respiratory: Positive for shortness of breath.   Cardiovascular: Negative.   Gastrointestinal: Negative.   Neurological: Negative.     PAST MEDICAL HISTORY: Reviewed Past Medical History  Diagnosis Date  . Lung cancer   . Hypertension   . Hypercholesteremia   . Metastatic lung cancer (metastasis from lung to other site) 01/21/2015  . Anemia 01/21/2015  . 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 GERD (gastroesophageal reflux disease)           Hypertension Osteoarthritis Breast lump removal in [place] about 10 years ago reportedly benign Partial hysterectomy 20 years ago, denies malignancy   Stage IV non-small cell right lung cancer with malignant effusion and left lung metastasis diagnosed in March 2016.   PAST SURGICAL HISTORY:Reviewed As above  FAMILY HISTORY:Reviewed Family History  Problem Relation Age of Onset  . Cervical cancer Mother   . Uterine cancer Maternal Grandmother     SOCIAL HISTORY: Reviewed Social History  Substance Use Topics  . Smoking status: Former Smoker -- 0.25 packs/day for 8 years    Types: Cigarettes    Quit date: 07/20/2014  . Smokeless tobacco: Not on file  . Alcohol Use: No    Allergies  Allergen Reactions  . Tegaderm Ag Mesh [Silver] Other (See Comments)    blisters    Current Outpatient Prescriptions  Medication Sig Dispense Refill  . dronabinol (MARINOL) 2.5 MG capsule Take 2.5 mg by mouth daily.    . ferrous sulfate 325 (65 FE) MG tablet Take 325 mg by mouth daily with breakfast.    . furosemide (LASIX) 20 MG tablet Take 1 tablet daily as needed for swelling. 30 tablet 2  . HYDROcodone-acetaminophen (NORCO) 5-325 MG per tablet Take 1 tablet by mouth every 6 (six) hours as needed for moderate pain. 90 tablet 0  . lidocaine-prilocaine (EMLA) cream Apply cream 1 hour before chemotherapy treatment 30 g 1  . lisinopril-hydrochlorothiazide (PRINZIDE,ZESTORETIC) 10-12.5 MG per tablet Take 1 tablet by mouth daily.    . Multiple Vitamins-Minerals (MULTIVITAMIN WITH MINERALS) tablet Take 1 tablet by mouth daily. 30 tablet 3  . omeprazole (PRILOSEC) 20 MG capsule Take 20 mg by mouth every morning.     . potassium chloride (K-DUR,KLOR-CON) 10 MEQ tablet Take 1 tablet (10 mEq total) by mouth daily. 8 tablet 0  . promethazine (PHENERGAN) 25 MG  tablet Take 25 mg by mouth every 6 (six) hours as needed for nausea or vomiting.    . simvastatin (ZOCOR) 20 MG tablet Take 20 mg by mouth at bedtime.     . ALPRAZolam (XANAX) 0.5 MG tablet Take 1 tablet (0.5 mg total) by mouth at bedtime as needed for anxiety. 30 tablet 0  . azithromycin (ZITHROMAX  Z-PAK) 250 MG tablet Take 2 tablets (500 mg) today, then 250 mg (1 tablet) daily for 4 days. (Patient not taking: Reported on 06/08/2015) 6 each 0  . potassium chloride (K-DUR) 10 MEQ tablet Take 1 tablet daily for 6 days and then only take 1 daily as needed when you take furosemide tablet 36 tablet 0  . zolpidem (AMBIEN) 5 MG tablet Take 1 tablet (5 mg total) by mouth at bedtime as needed for sleep. (Patient not taking: Reported on 06/08/2015) 30 tablet 1   No current facility-administered medications for this visit.   Facility-Administered Medications Ordered in Other Visits  Medication Dose Route Frequency Provider Last Rate Last Dose  . heparin lock flush 100 unit/mL  250 Units Intracatheter PRN Evlyn Kanner, NP   250 Units at 01/21/15 1734  . heparin lock flush 100 unit/mL  500 Units Intracatheter Once PRN Leia Alf, MD      . sodium chloride 0.9 % injection 10 mL  10 mL Intracatheter PRN Evlyn Kanner, NP   10 mL at 01/21/15 1734  . sodium chloride 0.9 % injection 10 mL  10 mL Intravenous PRN Leia Alf, MD   10 mL at 04/27/15 0905  . sodium chloride 0.9 % injection 10 mL  10 mL Intracatheter PRN Leia Alf, MD      . sodium chloride 0.9 % injection 3 mL  3 mL Intracatheter PRN Evlyn Kanner, NP   3 mL at 01/21/15 1731    OBJECTIVE: Filed Vitals:   06/08/15 1004  BP: 116/78  Pulse: 82  Temp: 98.2 F (36.8 C)     Body mass index is 27.39 kg/(m^2).    ECOG FS:1 - Symptomatic but completely ambulatory   General: Well-developed, well-nourished, no acute distress. Eyes: Pink conjunctiva, anicteric sclera. Lungs: Clear to auscultation bilaterally. Heart: Regular rate and rhythm. No rubs, murmurs, or gallops. Abdomen: Soft, nontender, nondistended. No organomegaly noted, normoactive bowel sounds. Musculoskeletal: No edema, cyanosis, or clubbing. Neuro: Alert, answering all questions appropriately. Cranial nerves grossly intact. Skin: No rashes or petechiae  noted. Psych: Normal affect.   LAB RESULTS: WBC 5, Hb 11, plt 292, ANC 2.4, Cr 0.68.    Component Value Date/Time   NA 139 06/08/2015 0932   NA 138 01/11/2015 0929   K 3.3* 06/08/2015 0932   K 3.4* 01/11/2015 0929   CL 104 06/08/2015 0932   CL 104 01/11/2015 0929   CO2 29 06/08/2015 0932   CO2 25 01/11/2015 0929   GLUCOSE 116* 06/08/2015 0932   GLUCOSE 142* 01/11/2015 0929   BUN 13 06/08/2015 0932   BUN 9 01/11/2015 0929   CREATININE 0.60 06/08/2015 0932   CREATININE 0.80 01/11/2015 0929   CALCIUM 9.4 06/08/2015 0932   CALCIUM 7.9* 01/11/2015 0929   PROT 7.3 06/01/2015 1059   PROT 5.3* 12/30/2014 0519   ALBUMIN 4.0 06/01/2015 1059   ALBUMIN 1.9* 12/30/2014 0519   AST 52* 06/01/2015 1059   AST 24 12/30/2014 0519   ALT 80* 06/01/2015 1059   ALT 46 12/30/2014 0519   ALKPHOS 49 06/01/2015 1059   ALKPHOS 87 12/30/2014 0519  BILITOT 0.5 06/01/2015 1059   BILITOT 1.9* 12/30/2014 0519   GFRNONAA >60 06/08/2015 0932   GFRNONAA >60 01/11/2015 0929   GFRAA >60 06/08/2015 0932   GFRAA >60 01/11/2015 0929       STUDIES: 12/01/14 - Right Pleural Fluid Cytology. POSITIVE FOR MALIGNANT CELLS. ADENOCARCINOMA, CONSISTENT WITH LUNG ORIGIN.  12/11/14 - CT head. IMPRESSION: 1. Multiple bilateral enhancing mass lesions compatible with metastatic disease to the brain. MRI would be more sensitive for small lesions if delineation of the specific number of lesions is important for the patient's treatment. 2. No evidence for hemorrhage or significant mass effect.  12/30/14 - CT Chest. IMPRESSION: 1. Decreased right hydropneumothorax with removal of right pleural catheter. Small residual subpulmonic fluid component persists with scattered foci of extrapleural air. Some improved aeration of the right lower lobe,, however diffuse lower lobe and perihilar right middle lobe consolidation persists. It is difficult to differentiate malignancy from atelectasis or pneumonia in the setting, however  patient has had recent PET which better characterized this. 2. Diffuse pleural thickening and nodularity in the right hemithorax. Diffuse pulmonary metastatic disease.  03/08/15 - CT Chest. IMPRESSION:  1. Overall mild partial interval response to therapy. Tumor involving the right lower lobe appears decreased from previous exam with associated improved aeration to the right lung. 2. Numerous pulmonary nodules are again noted within both lungs consistent with metastatic disease. These appear stable in the interval. 3. Resolution of previous right supraclavicular and right paratracheal adenopathy. 4. Aortic atherosclerosis.  04/20/15 - CT scan of Chest.  IMPRESSION:  1. Mild partial interval treatment response. Decreased size of spiculated central right lower lobe lung mass, likely the primary tumor. 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 Stable to mildly decreased pulmonary metastases.  2. Mildly increased small circumferential right pleural effusion.  3. Stable mild right hilar and right pericardiophrenic lymphadenopathy.  4. Stable indeterminate hypodense 1.2 cm left upper renal lesion.   ASSESSMENT / PLAN:    1. 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 On 12/01/14 - Right Pleural Fluid Cytology. POSITIVE FOR MALIGNANT CELLS. ADENOCARCINOMA, CONSISTENT WITH LUNG ORIGIN. Molecular testing for EGFR, ALK and others is negative. CT chest from 6/20 showed response to chemo  -  reviewed labs and recent CT chest from 8/2 and discussed with patient.   Proceed with cycle 3 single agent Alimta chemotherapy 500 mg/m2 IV. Patient will also receive B 12 injections with every treatment. Return to clinic weekly for laboratory work and in 3 weeks for consideration of cycle 4. Patient understands she can return to clinic at any time if she has any  questions, concerns, or complaints.  2. 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 3. Pain - continue Norco prn. 4. Nutrition - encouraged to maintain adequate protein caloric intake.     Lloyd Huger, MD   06/15/2015 9:17 AM

## 2015-06-22 ENCOUNTER — Inpatient Hospital Stay: Payer: 59 | Attending: Internal Medicine

## 2015-06-22 DIAGNOSIS — Z87891 Personal history of nicotine dependence: Secondary | ICD-10-CM | POA: Diagnosis not present

## 2015-06-22 DIAGNOSIS — K219 Gastro-esophageal reflux disease without esophagitis: Secondary | ICD-10-CM | POA: Insufficient documentation

## 2015-06-22 DIAGNOSIS — C7931 Secondary malignant neoplasm of brain: Secondary | ICD-10-CM | POA: Insufficient documentation

## 2015-06-22 DIAGNOSIS — I10 Essential (primary) hypertension: Secondary | ICD-10-CM | POA: Diagnosis not present

## 2015-06-22 DIAGNOSIS — J910 Malignant pleural effusion: Secondary | ICD-10-CM | POA: Diagnosis not present

## 2015-06-22 DIAGNOSIS — E7800 Pure hypercholesterolemia, unspecified: Secondary | ICD-10-CM | POA: Insufficient documentation

## 2015-06-22 DIAGNOSIS — Z79899 Other long term (current) drug therapy: Secondary | ICD-10-CM | POA: Insufficient documentation

## 2015-06-22 DIAGNOSIS — C3491 Malignant neoplasm of unspecified part of right bronchus or lung: Secondary | ICD-10-CM | POA: Diagnosis not present

## 2015-06-22 DIAGNOSIS — M1990 Unspecified osteoarthritis, unspecified site: Secondary | ICD-10-CM | POA: Diagnosis not present

## 2015-06-22 DIAGNOSIS — Z5111 Encounter for antineoplastic chemotherapy: Secondary | ICD-10-CM | POA: Insufficient documentation

## 2015-06-22 DIAGNOSIS — R0609 Other forms of dyspnea: Secondary | ICD-10-CM | POA: Diagnosis not present

## 2015-06-22 DIAGNOSIS — C7802 Secondary malignant neoplasm of left lung: Secondary | ICD-10-CM | POA: Insufficient documentation

## 2015-06-22 LAB — CBC WITH DIFFERENTIAL/PLATELET
BASOS ABS: 0 10*3/uL (ref 0–0.1)
BASOS PCT: 0 %
EOS ABS: 0.2 10*3/uL (ref 0–0.7)
Eosinophils Relative: 3 %
HCT: 34.2 % — ABNORMAL LOW (ref 35.0–47.0)
Hemoglobin: 11.3 g/dL — ABNORMAL LOW (ref 12.0–16.0)
Lymphocytes Relative: 33 %
Lymphs Abs: 2 10*3/uL (ref 1.0–3.6)
MCH: 27.9 pg (ref 26.0–34.0)
MCHC: 33.1 g/dL (ref 32.0–36.0)
MCV: 84.5 fL (ref 80.0–100.0)
MONO ABS: 0.9 10*3/uL (ref 0.2–0.9)
MONOS PCT: 15 %
Neutro Abs: 3 10*3/uL (ref 1.4–6.5)
Neutrophils Relative %: 49 %
PLATELETS: 178 10*3/uL (ref 150–440)
RBC: 4.05 MIL/uL (ref 3.80–5.20)
RDW: 15.8 % — AB (ref 11.5–14.5)
WBC: 6.1 10*3/uL (ref 3.6–11.0)

## 2015-06-28 ENCOUNTER — Other Ambulatory Visit: Payer: Self-pay | Admitting: *Deleted

## 2015-06-28 DIAGNOSIS — C3490 Malignant neoplasm of unspecified part of unspecified bronchus or lung: Secondary | ICD-10-CM

## 2015-06-29 ENCOUNTER — Other Ambulatory Visit: Payer: 59

## 2015-06-29 ENCOUNTER — Inpatient Hospital Stay: Payer: 59

## 2015-06-29 ENCOUNTER — Ambulatory Visit: Payer: 59

## 2015-06-29 ENCOUNTER — Ambulatory Visit: Payer: 59 | Admitting: Internal Medicine

## 2015-06-29 ENCOUNTER — Inpatient Hospital Stay (HOSPITAL_BASED_OUTPATIENT_CLINIC_OR_DEPARTMENT_OTHER): Payer: 59 | Admitting: Oncology

## 2015-06-29 VITALS — BP 125/74 | HR 81 | Temp 95.6°F | Resp 18 | Wt 166.2 lb

## 2015-06-29 DIAGNOSIS — C3491 Malignant neoplasm of unspecified part of right bronchus or lung: Secondary | ICD-10-CM

## 2015-06-29 DIAGNOSIS — M1990 Unspecified osteoarthritis, unspecified site: Secondary | ICD-10-CM

## 2015-06-29 DIAGNOSIS — E7800 Pure hypercholesterolemia, unspecified: Secondary | ICD-10-CM

## 2015-06-29 DIAGNOSIS — J910 Malignant pleural effusion: Secondary | ICD-10-CM | POA: Diagnosis not present

## 2015-06-29 DIAGNOSIS — R0609 Other forms of dyspnea: Secondary | ICD-10-CM

## 2015-06-29 DIAGNOSIS — Z5111 Encounter for antineoplastic chemotherapy: Secondary | ICD-10-CM | POA: Diagnosis not present

## 2015-06-29 DIAGNOSIS — C3490 Malignant neoplasm of unspecified part of unspecified bronchus or lung: Secondary | ICD-10-CM

## 2015-06-29 DIAGNOSIS — Z87891 Personal history of nicotine dependence: Secondary | ICD-10-CM

## 2015-06-29 DIAGNOSIS — C7802 Secondary malignant neoplasm of left lung: Secondary | ICD-10-CM

## 2015-06-29 DIAGNOSIS — C7931 Secondary malignant neoplasm of brain: Secondary | ICD-10-CM | POA: Diagnosis not present

## 2015-06-29 DIAGNOSIS — I10 Essential (primary) hypertension: Secondary | ICD-10-CM

## 2015-06-29 DIAGNOSIS — Z79899 Other long term (current) drug therapy: Secondary | ICD-10-CM

## 2015-06-29 DIAGNOSIS — K219 Gastro-esophageal reflux disease without esophagitis: Secondary | ICD-10-CM

## 2015-06-29 LAB — CBC WITH DIFFERENTIAL/PLATELET
BASOS ABS: 0 10*3/uL (ref 0–0.1)
BASOS PCT: 1 %
EOS ABS: 0.2 10*3/uL (ref 0–0.7)
EOS PCT: 3 %
HCT: 32.6 % — ABNORMAL LOW (ref 35.0–47.0)
Hemoglobin: 11 g/dL — ABNORMAL LOW (ref 12.0–16.0)
Lymphocytes Relative: 34 %
Lymphs Abs: 1.8 10*3/uL (ref 1.0–3.6)
MCH: 28.5 pg (ref 26.0–34.0)
MCHC: 33.7 g/dL (ref 32.0–36.0)
MCV: 84.5 fL (ref 80.0–100.0)
MONO ABS: 0.7 10*3/uL (ref 0.2–0.9)
Monocytes Relative: 13 %
NEUTROS ABS: 2.7 10*3/uL (ref 1.4–6.5)
Neutrophils Relative %: 49 %
PLATELETS: 241 10*3/uL (ref 150–440)
RBC: 3.85 MIL/uL (ref 3.80–5.20)
RDW: 15.6 % — AB (ref 11.5–14.5)
WBC: 5.4 10*3/uL (ref 3.6–11.0)

## 2015-06-29 LAB — BASIC METABOLIC PANEL
ANION GAP: 7 (ref 5–15)
BUN: 22 mg/dL — ABNORMAL HIGH (ref 6–20)
CALCIUM: 8.9 mg/dL (ref 8.9–10.3)
CO2: 27 mmol/L (ref 22–32)
CREATININE: 0.64 mg/dL (ref 0.44–1.00)
Chloride: 105 mmol/L (ref 101–111)
Glucose, Bld: 129 mg/dL — ABNORMAL HIGH (ref 65–99)
Potassium: 3.4 mmol/L — ABNORMAL LOW (ref 3.5–5.1)
SODIUM: 139 mmol/L (ref 135–145)

## 2015-06-29 MED ORDER — SODIUM CHLORIDE 0.9 % IJ SOLN
10.0000 mL | Freq: Once | INTRAMUSCULAR | Status: AC
  Administered 2015-06-29: 10 mL via INTRAVENOUS
  Filled 2015-06-29: qty 10

## 2015-06-29 MED ORDER — CYANOCOBALAMIN 1000 MCG/ML IJ SOLN
1000.0000 ug | Freq: Once | INTRAMUSCULAR | Status: AC
Start: 2015-06-29 — End: 2015-06-29
  Administered 2015-06-29: 1000 ug via INTRAMUSCULAR
  Filled 2015-06-29: qty 1

## 2015-06-29 MED ORDER — HEPARIN SOD (PORK) LOCK FLUSH 100 UNIT/ML IV SOLN
500.0000 [IU] | Freq: Once | INTRAVENOUS | Status: AC
  Administered 2015-06-29: 500 [IU] via INTRAVENOUS
  Filled 2015-06-29: qty 5

## 2015-06-29 MED ORDER — DEXAMETHASONE SODIUM PHOSPHATE 100 MG/10ML IJ SOLN
Freq: Once | INTRAMUSCULAR | Status: AC
  Administered 2015-06-29: 11:00:00 via INTRAVENOUS
  Filled 2015-06-29: qty 4

## 2015-06-29 MED ORDER — SODIUM CHLORIDE 0.9 % IV SOLN
500.0000 mg/m2 | Freq: Once | INTRAVENOUS | Status: AC
  Administered 2015-06-29: 925 mg via INTRAVENOUS
  Filled 2015-06-29: qty 37

## 2015-06-29 MED ORDER — SODIUM CHLORIDE 0.9 % IV SOLN
Freq: Once | INTRAVENOUS | Status: AC
  Administered 2015-06-29: 10:00:00 via INTRAVENOUS
  Filled 2015-06-29: qty 1000

## 2015-07-13 NOTE — Progress Notes (Signed)
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  12/01/14 - Right Pleural Fluid Cytology. POSITIVE FOR MALIGNANT CELLS. ADENOCARCINOMA, CONSISTENT WITH LUNG ORIGIN.   Molecular testing for EGFR, ALK and others is negative.  Patient started palliative chemotherapy with Alimta/carboplatin on 12/14/14, got cycle 6 on 04/06/15, now on single agent maintenance Alimta.   HISTORY OF PRESENT ILLNESS:  Patient returns for further evaluation and consideration of her next infusion of pemetrexed. She is tolerating her treatments well without significant side effects. She denies any recent fevers. She has no neurologic complaints. She continues to have dyspnea on exertion which is unchanged. She denies any chest pain. She denies any nausea, vomiting, constipation, or diarrhea. Patient offers no specific complaints today.    REVIEW OF SYSTEMS:   Review of Systems  Constitutional: Negative for fever and malaise/fatigue.  Respiratory: Positive for shortness of breath.   Cardiovascular: Negative.   Gastrointestinal: Negative.   Neurological: Negative.     PAST MEDICAL HISTORY: Reviewed Past Medical History  Diagnosis Date  . Lung cancer   . Hypertension   . Hypercholesteremia   . Metastatic lung cancer (metastasis from lung to other site) 01/21/2015  . Anemia 01/21/2015  . 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 GERD (gastroesophageal reflux disease)           Hypertension Osteoarthritis Breast lump removal in [place] about 10 years ago reportedly benign Partial hysterectomy 20 years ago, denies malignancy  Stage IV  non-small cell right lung cancer with malignant effusion and left lung metastasis diagnosed in March 2016.   PAST SURGICAL HISTORY:Reviewed As above  FAMILY HISTORY:Reviewed Family History  Problem Relation Age of Onset  . Cervical cancer Mother   . Uterine cancer Maternal Grandmother     SOCIAL HISTORY: Reviewed Social History  Substance Use Topics  . Smoking status: Former Smoker -- 0.25 packs/day for 8 years    Types: Cigarettes    Quit date: 07/20/2014  . Smokeless tobacco: Not on file  . Alcohol Use: No    Allergies  Allergen Reactions  . Tegaderm Ag Mesh [Silver] Other (See Comments)    blisters    Current Outpatient Prescriptions  Medication Sig Dispense Refill  . ALPRAZolam (XANAX) 0.5 MG tablet Take 1 tablet (0.5 mg total) by mouth at bedtime as needed for anxiety. 30 tablet 0  . ferrous sulfate 325 (65 FE) MG tablet Take 325 mg by mouth daily with breakfast.    . furosemide (LASIX) 20 MG tablet Take 1 tablet daily as needed for swelling. 30 tablet 2  . HYDROcodone-acetaminophen (NORCO) 5-325 MG per tablet Take 1 tablet by mouth every 6 (six) hours as needed for moderate pain. 90 tablet 0  . lidocaine-prilocaine (EMLA) cream Apply cream 1 hour before chemotherapy treatment 30 g 1  . lisinopril-hydrochlorothiazide (PRINZIDE,ZESTORETIC) 10-12.5 MG per tablet Take 1 tablet by mouth daily.    . Multiple Vitamins-Minerals (MULTIVITAMIN WITH MINERALS) tablet Take 1 tablet by mouth daily. 30 tablet 3  . omeprazole (PRILOSEC) 20 MG capsule Take 20 mg by mouth every morning.     . potassium chloride (K-DUR) 10 MEQ tablet Take 1 tablet daily for 6 days and  then only take 1 daily as needed when you take furosemide tablet 36 tablet 0  . promethazine (PHENERGAN) 25 MG tablet Take 25 mg by mouth every 6 (six) hours as needed for nausea or vomiting.    . simvastatin (ZOCOR) 20 MG tablet Take 20 mg by mouth at bedtime.      No current facility-administered medications for this  visit.   Facility-Administered Medications Ordered in Other Visits  Medication Dose Route Frequency Provider Last Rate Last Dose  . heparin lock flush 100 unit/mL  250 Units Intracatheter PRN Evlyn Kanner, NP   250 Units at 01/21/15 1734  . heparin lock flush 100 unit/mL  500 Units Intracatheter Once PRN Leia Alf, MD      . sodium chloride 0.9 % injection 10 mL  10 mL Intracatheter PRN Evlyn Kanner, NP   10 mL at 01/21/15 1734  . sodium chloride 0.9 % injection 10 mL  10 mL Intravenous PRN Leia Alf, MD   10 mL at 04/27/15 0905  . sodium chloride 0.9 % injection 10 mL  10 mL Intracatheter PRN Leia Alf, MD      . sodium chloride 0.9 % injection 3 mL  3 mL Intracatheter PRN Evlyn Kanner, NP   3 mL at 01/21/15 1731    OBJECTIVE: Filed Vitals:   06/29/15 0921  BP: 125/74  Pulse: 81  Temp: 95.6 F (35.3 C)  Resp: 18     Body mass index is 27.66 kg/(m^2).    ECOG FS:1 - Symptomatic but completely ambulatory   General: Well-developed, well-nourished, no acute distress. Eyes: Pink conjunctiva, anicteric sclera. Lungs: Clear to auscultation bilaterally. Heart: Regular rate and rhythm. No rubs, murmurs, or gallops. Abdomen: Soft, nontender, nondistended. No organomegaly noted, normoactive bowel sounds. Musculoskeletal: No edema, cyanosis, or clubbing. Neuro: Alert, answering all questions appropriately. Cranial nerves grossly intact. Skin: No rashes or petechiae noted. Psych: Normal affect.   LAB RESULTS: WBC 5, Hb 11, plt 292, ANC 2.4, Cr 0.68.    Component Value Date/Time   NA 139 06/29/2015 0839   NA 138 01/11/2015 0929   K 3.4* 06/29/2015 0839   K 3.4* 01/11/2015 0929   CL 105 06/29/2015 0839   CL 104 01/11/2015 0929   CO2 27 06/29/2015 0839   CO2 25 01/11/2015 0929   GLUCOSE 129* 06/29/2015 0839   GLUCOSE 142* 01/11/2015 0929   BUN 22* 06/29/2015 0839   BUN 9 01/11/2015 0929   CREATININE 0.64 06/29/2015 0839   CREATININE 0.80 01/11/2015 0929     CALCIUM 8.9 06/29/2015 0839   CALCIUM 7.9* 01/11/2015 0929   PROT 7.3 06/01/2015 1059   PROT 5.3* 12/30/2014 0519   ALBUMIN 4.0 06/01/2015 1059   ALBUMIN 1.9* 12/30/2014 0519   AST 52* 06/01/2015 1059   AST 24 12/30/2014 0519   ALT 80* 06/01/2015 1059   ALT 46 12/30/2014 0519   ALKPHOS 49 06/01/2015 1059   ALKPHOS 87 12/30/2014 0519   BILITOT 0.5 06/01/2015 1059   BILITOT 1.9* 12/30/2014 0519   GFRNONAA >60 06/29/2015 0839   GFRNONAA >60 01/11/2015 0929   GFRAA >60 06/29/2015 0839   GFRAA >60 01/11/2015 0929       STUDIES: 12/01/14 - Right Pleural Fluid Cytology. POSITIVE FOR MALIGNANT CELLS. ADENOCARCINOMA, CONSISTENT WITH LUNG ORIGIN.  12/11/14 - CT head. IMPRESSION: 1. Multiple bilateral enhancing mass lesions compatible with metastatic disease to the brain. MRI would be more sensitive for small lesions if delineation of the specific number of  lesions is important for the patient's treatment. 2. No evidence for hemorrhage or significant mass effect.  12/30/14 - CT Chest. IMPRESSION: 1. Decreased right hydropneumothorax with removal of right pleural catheter. Small residual subpulmonic fluid component persists with scattered foci of extrapleural air. Some improved aeration of the right lower lobe,, however diffuse lower lobe and perihilar right middle lobe consolidation persists. It is difficult to differentiate malignancy from atelectasis or pneumonia in the setting, however patient has had recent PET which better characterized this. 2. Diffuse pleural thickening and nodularity in the right hemithorax. Diffuse pulmonary metastatic disease.  03/08/15 - CT Chest. IMPRESSION:  1. Overall mild partial interval response to therapy. Tumor involving the right lower lobe appears decreased from previous exam with associated improved aeration to the right lung. 2. Numerous pulmonary nodules are again noted within both lungs consistent with metastatic disease. These appear stable in the  interval. 3. Resolution of previous right supraclavicular and right paratracheal adenopathy. 4. Aortic atherosclerosis.  04/20/15 - CT scan of Chest.  IMPRESSION:  1. Mild partial interval treatment response. Decreased size of spiculated central right lower lobe lung mass, likely the primary tumor. 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 Stable to mildly decreased pulmonary metastases.  2. Mildly increased small circumferential right pleural effusion.  3. Stable mild right hilar and right pericardiophrenic lymphadenopathy.  4. Stable indeterminate hypodense 1.2 cm left upper renal lesion.   ASSESSMENT / PLAN:    1. 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 On 12/01/14 - Right Pleural Fluid Cytology. POSITIVE FOR MALIGNANT CELLS. ADENOCARCINOMA, CONSISTENT WITH LUNG ORIGIN. Molecular testing for EGFR, ALK and others is negative. CT chest from 6/20 showed response to chemo  -  reviewed labs and recent CT chest from 8/2 and discussed with patient.   Proceed with cycle 4 single agent Alimta chemotherapy 500 mg/m2 IV today. Patient will also receive B 12 injections with every treatment. Return to clinic weekly for laboratory work and in 3 weeks for consideration of cycle 5. Plan to repeat imaging in approximately November 2016. Patient understands she can return to clinic at any time if she has any questions, concerns, or complaints.  2. 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 3. Pain - continue Norco prn. 4. Nutrition - encouraged to maintain adequate protein caloric intake.     Lloyd Huger, MD   07/13/2015 10:43 AM

## 2015-07-20 ENCOUNTER — Inpatient Hospital Stay: Payer: 59

## 2015-07-20 ENCOUNTER — Encounter: Payer: Self-pay | Admitting: Oncology

## 2015-07-20 ENCOUNTER — Inpatient Hospital Stay: Payer: 59 | Attending: Oncology | Admitting: Oncology

## 2015-07-20 VITALS — BP 125/87 | HR 91 | Temp 95.4°F | Resp 18 | Ht 65.0 in | Wt 166.0 lb

## 2015-07-20 DIAGNOSIS — Z79899 Other long term (current) drug therapy: Secondary | ICD-10-CM | POA: Diagnosis not present

## 2015-07-20 DIAGNOSIS — Z87891 Personal history of nicotine dependence: Secondary | ICD-10-CM | POA: Insufficient documentation

## 2015-07-20 DIAGNOSIS — R0609 Other forms of dyspnea: Secondary | ICD-10-CM | POA: Insufficient documentation

## 2015-07-20 DIAGNOSIS — J910 Malignant pleural effusion: Secondary | ICD-10-CM | POA: Diagnosis not present

## 2015-07-20 DIAGNOSIS — M1990 Unspecified osteoarthritis, unspecified site: Secondary | ICD-10-CM | POA: Diagnosis not present

## 2015-07-20 DIAGNOSIS — I10 Essential (primary) hypertension: Secondary | ICD-10-CM | POA: Insufficient documentation

## 2015-07-20 DIAGNOSIS — C3490 Malignant neoplasm of unspecified part of unspecified bronchus or lung: Secondary | ICD-10-CM

## 2015-07-20 DIAGNOSIS — C7931 Secondary malignant neoplasm of brain: Secondary | ICD-10-CM | POA: Diagnosis not present

## 2015-07-20 DIAGNOSIS — C3491 Malignant neoplasm of unspecified part of right bronchus or lung: Secondary | ICD-10-CM

## 2015-07-20 DIAGNOSIS — E7800 Pure hypercholesterolemia, unspecified: Secondary | ICD-10-CM | POA: Insufficient documentation

## 2015-07-20 DIAGNOSIS — C7802 Secondary malignant neoplasm of left lung: Secondary | ICD-10-CM | POA: Diagnosis not present

## 2015-07-20 DIAGNOSIS — M25561 Pain in right knee: Secondary | ICD-10-CM | POA: Diagnosis not present

## 2015-07-20 DIAGNOSIS — K219 Gastro-esophageal reflux disease without esophagitis: Secondary | ICD-10-CM | POA: Diagnosis not present

## 2015-07-20 DIAGNOSIS — Z5111 Encounter for antineoplastic chemotherapy: Secondary | ICD-10-CM | POA: Diagnosis not present

## 2015-07-20 LAB — COMPREHENSIVE METABOLIC PANEL
ALBUMIN: 3.7 g/dL (ref 3.5–5.0)
ALK PHOS: 57 U/L (ref 38–126)
ALT: 53 U/L (ref 14–54)
AST: 41 U/L (ref 15–41)
Anion gap: 4 — ABNORMAL LOW (ref 5–15)
BUN: 17 mg/dL (ref 6–20)
CALCIUM: 8.6 mg/dL — AB (ref 8.9–10.3)
CHLORIDE: 104 mmol/L (ref 101–111)
CO2: 27 mmol/L (ref 22–32)
CREATININE: 0.69 mg/dL (ref 0.44–1.00)
GFR calc Af Amer: >60 mL/min (ref >60)
GFR calc non Af Amer: >60 mL/min (ref >60)
GLUCOSE: 109 mg/dL — AB (ref 65–99)
Potassium: 3.3 mmol/L — ABNORMAL LOW (ref 3.5–5.1)
Sodium: 135 mmol/L (ref 135–145)
Total Bilirubin: 1 mg/dL (ref 0.3–1.2)
Total Protein: 7.2 g/dL (ref 6.5–8.1)

## 2015-07-20 LAB — CBC WITH DIFFERENTIAL/PLATELET
BASOS ABS: 0 10*3/uL (ref 0–0.1)
BASOS PCT: 1 %
Eosinophils Absolute: 0.1 10*3/uL (ref 0–0.7)
Eosinophils Relative: 2 %
HEMATOCRIT: 33.7 % — AB (ref 35.0–47.0)
Hemoglobin: 11.2 g/dL — ABNORMAL LOW (ref 12.0–16.0)
Lymphocytes Relative: 26 %
Lymphs Abs: 1.6 10*3/uL (ref 1.0–3.6)
MCH: 28.3 pg (ref 26.0–34.0)
MCHC: 33.1 g/dL (ref 32.0–36.0)
MCV: 85.4 fL (ref 80.0–100.0)
MONO ABS: 0.7 10*3/uL (ref 0.2–0.9)
Monocytes Relative: 11 %
NEUTROS ABS: 3.8 10*3/uL (ref 1.4–6.5)
Neutrophils Relative %: 60 %
PLATELETS: 259 10*3/uL (ref 150–440)
RBC: 3.95 MIL/uL (ref 3.80–5.20)
RDW: 15 % — AB (ref 11.5–14.5)
WBC: 6.2 10*3/uL (ref 3.6–11.0)

## 2015-07-20 MED ORDER — CYANOCOBALAMIN 1000 MCG/ML IJ SOLN
1000.0000 ug | Freq: Once | INTRAMUSCULAR | Status: AC
  Administered 2015-07-20: 1000 ug via INTRAMUSCULAR
  Filled 2015-07-20: qty 1

## 2015-07-20 MED ORDER — HEPARIN SOD (PORK) LOCK FLUSH 100 UNIT/ML IV SOLN
500.0000 [IU] | Freq: Once | INTRAVENOUS | Status: AC | PRN
  Administered 2015-07-20: 500 [IU]
  Filled 2015-07-20: qty 5

## 2015-07-20 MED ORDER — SODIUM CHLORIDE 0.9 % IJ SOLN
10.0000 mL | INTRAMUSCULAR | Status: DC | PRN
  Administered 2015-07-20: 10 mL
  Filled 2015-07-20: qty 10

## 2015-07-20 MED ORDER — PEMETREXED DISODIUM CHEMO INJECTION 500 MG
500.0000 mg/m2 | Freq: Once | INTRAVENOUS | Status: AC
  Administered 2015-07-20: 925 mg via INTRAVENOUS
  Filled 2015-07-20: qty 37

## 2015-07-20 MED ORDER — SODIUM CHLORIDE 0.9 % IV SOLN
Freq: Once | INTRAVENOUS | Status: AC
  Administered 2015-07-20: 12:00:00 via INTRAVENOUS
  Filled 2015-07-20: qty 1000

## 2015-07-20 MED ORDER — SODIUM CHLORIDE 0.9 % IV SOLN
Freq: Once | INTRAVENOUS | Status: AC
  Administered 2015-07-20: 12:00:00 via INTRAVENOUS
  Filled 2015-07-20: qty 4

## 2015-07-20 NOTE — Progress Notes (Signed)
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  12/01/14 - Right Pleural Fluid Cytology. POSITIVE FOR MALIGNANT CELLS. ADENOCARCINOMA, CONSISTENT WITH LUNG ORIGIN.   Molecular testing for EGFR, ALK and others is negative.  Patient started palliative chemotherapy with Alimta/carboplatin on 12/14/14, got cycle 6 on 04/06/15, now on single agent maintenance Alimta.   HISTORY OF PRESENT ILLNESS:  Patient returns for further evaluation and consideration of her next infusion of pemetrexed. She is tolerating her treatments well without significant side effects. She denies any recent fevers. She has no neurologic complaints. She continues to have dyspnea on exertion which is unchanged. She denies any chest pain. She denies any nausea, vomiting, constipation, or diarrhea. Patient offers no specific complaints today.    REVIEW OF SYSTEMS:   Review of Systems  Constitutional: Negative for fever and malaise/fatigue.  Respiratory: Positive for shortness of breath.   Cardiovascular: Negative.   Gastrointestinal: Negative.   Neurological: Negative.     PAST MEDICAL HISTORY: Reviewed Past Medical History  Diagnosis Date  . Lung cancer ([place])   . Hypertension   . Hypercholesteremia   . Metastatic lung cancer (metastasis from lung to other site) ([place]) 01/21/2015  . Anemia 01/21/2015  . 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 GERD (gastroesophageal reflux disease)           Hypertension Osteoarthritis Breast lump removal in [place] about 10 years ago reportedly benign Partial hysterectomy 20 years ago, denies  malignancy  Stage IV non-small cell right lung cancer with malignant effusion and left lung metastasis diagnosed in March 2016.   PAST SURGICAL HISTORY:Reviewed As above  FAMILY HISTORY:Reviewed Family History  Problem Relation Age of Onset  . Cervical cancer Mother   . Uterine cancer Maternal Grandmother     SOCIAL HISTORY: Reviewed Social History  Substance Use Topics  . Smoking status: Former Smoker -- 0.25 packs/day for 8 years    Types: Cigarettes    Quit date: 07/20/2014  . Smokeless tobacco: None  . Alcohol Use: No    Allergies  Allergen Reactions  . Tegaderm Ag Mesh [Silver] Other (See Comments)    blisters    Current Outpatient Prescriptions  Medication Sig Dispense Refill  . ALPRAZolam (XANAX) 0.5 MG tablet Take 1 tablet (0.5 mg total) by mouth at bedtime as needed for anxiety. 30 tablet 0  . ferrous sulfate 325 (65 FE) MG tablet Take 325 mg by mouth daily with breakfast.    . furosemide (LASIX) 20 MG tablet Take 1 tablet daily as needed for swelling. 30 tablet 2  . HYDROcodone-acetaminophen (NORCO) 5-325 MG per tablet Take 1 tablet by mouth every 6 (six) hours as needed for moderate pain. 90 tablet 0  . lidocaine-prilocaine (EMLA) cream Apply cream 1 hour before chemotherapy treatment 30 g 1  . lisinopril-hydrochlorothiazide (PRINZIDE,ZESTORETIC) 10-12.5 MG per tablet Take 1 tablet by mouth daily.    . Multiple Vitamins-Minerals (MULTIVITAMIN WITH MINERALS) tablet Take 1 tablet by mouth daily. 30 tablet 3  . omeprazole (PRILOSEC) 20 MG capsule Take 20 mg by mouth every morning.     . potassium chloride (K-DUR) 10 MEQ tablet Take 1 tablet daily for 6 days and  then only take 1 daily as needed when you take furosemide tablet 36 tablet 0  . promethazine (PHENERGAN) 25 MG tablet Take 25 mg by mouth every 6 (six) hours as needed for nausea or vomiting.    . simvastatin (ZOCOR) 20 MG tablet Take 20 mg by mouth at bedtime.      No current facility-administered  medications for this visit.   Facility-Administered Medications Ordered in Other Visits  Medication Dose Route Frequency Provider Last Rate Last Dose  . heparin lock flush 100 unit/mL  250 Units Intracatheter PRN Evlyn Kanner, NP   250 Units at 01/21/15 1734  . heparin lock flush 100 unit/mL  500 Units Intracatheter Once PRN Leia Alf, MD      . sodium chloride 0.9 % injection 10 mL  10 mL Intracatheter PRN Evlyn Kanner, NP   10 mL at 01/21/15 1734  . sodium chloride 0.9 % injection 10 mL  10 mL Intravenous PRN Leia Alf, MD   10 mL at 04/27/15 0905  . sodium chloride 0.9 % injection 10 mL  10 mL Intracatheter PRN Leia Alf, MD      . sodium chloride 0.9 % injection 3 mL  3 mL Intracatheter PRN Evlyn Kanner, NP   3 mL at 01/21/15 1731    OBJECTIVE: Filed Vitals:   07/20/15 1045  BP: 125/87  Pulse: 91  Temp: 95.4 F (35.2 C)  Resp: 18     Body mass index is 27.62 kg/(m^2).    ECOG FS:1 - Symptomatic but completely ambulatory   General: Well-developed, well-nourished, no acute distress. Eyes: Pink conjunctiva, anicteric sclera. Lungs: Clear to auscultation bilaterally. Heart: Regular rate and rhythm. No rubs, murmurs, or gallops. Abdomen: Soft, nontender, nondistended. No organomegaly noted, normoactive bowel sounds. Musculoskeletal: No edema, cyanosis, or clubbing. Neuro: Alert, answering all questions appropriately. Cranial nerves grossly intact. Skin: No rashes or petechiae noted. Psych: Normal affect.   LAB RESULTS: WBC 5, Hb 11, plt 292, ANC 2.4, Cr 0.68.    Component Value Date/Time   NA 135 07/20/2015 1119   NA 138 01/11/2015 0929   K 3.3* 07/20/2015 1119   K 3.4* 01/11/2015 0929   CL 104 07/20/2015 1119   CL 104 01/11/2015 0929   CO2 27 07/20/2015 1119   CO2 25 01/11/2015 0929   GLUCOSE 109* 07/20/2015 1119   GLUCOSE 142* 01/11/2015 0929   BUN 17 07/20/2015 1119   BUN 9 01/11/2015 0929   CREATININE 0.69 07/20/2015 1119   CREATININE  0.80 01/11/2015 0929   CALCIUM 8.6* 07/20/2015 1119   CALCIUM 7.9* 01/11/2015 0929   PROT 7.2 07/20/2015 1119   PROT 5.3* 12/30/2014 0519   ALBUMIN 3.7 07/20/2015 1119   ALBUMIN 1.9* 12/30/2014 0519   AST 41 07/20/2015 1119   AST 24 12/30/2014 0519   ALT 53 07/20/2015 1119   ALT 46 12/30/2014 0519   ALKPHOS 57 07/20/2015 1119   ALKPHOS 87 12/30/2014 0519   BILITOT 1.0 07/20/2015 1119   BILITOT 1.9* 12/30/2014 0519   GFRNONAA >60 07/20/2015 1119   GFRNONAA >60 01/11/2015 0929   GFRAA >60 07/20/2015 1119   GFRAA >60 01/11/2015 0929       STUDIES: 12/01/14 - Right Pleural Fluid Cytology. POSITIVE FOR MALIGNANT CELLS. ADENOCARCINOMA, CONSISTENT WITH LUNG ORIGIN.  12/11/14 - CT head. IMPRESSION: 1. Multiple bilateral enhancing mass lesions compatible with metastatic disease to the brain. MRI would be more sensitive for small lesions if delineation of the specific number of lesions  is important for the patient's treatment. 2. No evidence for hemorrhage or significant mass effect.  12/30/14 - CT Chest. IMPRESSION: 1. Decreased right hydropneumothorax with removal of right pleural catheter. Small residual subpulmonic fluid component persists with scattered foci of extrapleural air. Some improved aeration of the right lower lobe,, however diffuse lower lobe and perihilar right middle lobe consolidation persists. It is difficult to differentiate malignancy from atelectasis or pneumonia in the setting, however patient has had recent PET which better characterized this. 2. Diffuse pleural thickening and nodularity in the right hemithorax. Diffuse pulmonary metastatic disease.  03/08/15 - CT Chest. IMPRESSION:  1. Overall mild partial interval response to therapy. Tumor involving the right lower lobe appears decreased from previous exam with associated improved aeration to the right lung. 2. Numerous pulmonary nodules are again noted within both lungs consistent with metastatic disease. These appear  stable in the interval. 3. Resolution of previous right supraclavicular and right paratracheal adenopathy. 4. Aortic atherosclerosis.  04/20/15 - CT scan of Chest.  IMPRESSION:  1. Mild partial interval treatment response. Decreased size of spiculated central right lower lobe lung mass, likely the primary tumor. 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 Stable to mildly decreased pulmonary metastases.  2. Mildly increased small circumferential right pleural effusion.  3. Stable mild right hilar and right pericardiophrenic lymphadenopathy.  4. Stable indeterminate hypodense 1.2 cm left upper renal lesion.   ASSESSMENT / PLAN:    1. 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 On 12/01/14 - Right Pleural Fluid Cytology. POSITIVE FOR MALIGNANT CELLS. ADENOCARCINOMA, CONSISTENT WITH LUNG ORIGIN. Molecular testing for EGFR, ALK and others is negative.   Proceed with cycle 5 single agent Alimta 500 mg/m2 IV today. Patient will also receive B 12 injections with every treatment. Return to clinic in 3 weeks for consideration of cycle 6. Plan to repeat imaging the week of December 5th between cycles 6&7. Patient understands she can return to clinic at any time if she has any questions, concerns, or complaints.  2. 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 3. Pain - continue Norco prn.     Lloyd Huger, MD   07/20/2015 10:35 PM

## 2015-07-21 ENCOUNTER — Other Ambulatory Visit: Payer: Self-pay

## 2015-07-21 MED ORDER — ALPRAZOLAM 0.5 MG PO TABS: 0.5000 mg | ORAL_TABLET | Freq: Every evening | ORAL | Status: DC | PRN

## 2015-08-11 ENCOUNTER — Inpatient Hospital Stay (HOSPITAL_BASED_OUTPATIENT_CLINIC_OR_DEPARTMENT_OTHER): Payer: 59 | Admitting: Family Medicine

## 2015-08-11 ENCOUNTER — Inpatient Hospital Stay: Payer: 59

## 2015-08-11 ENCOUNTER — Other Ambulatory Visit: Payer: Self-pay | Admitting: Family Medicine

## 2015-08-11 VITALS — BP 120/75 | HR 70 | Temp 95.5°F | Resp 18 | Wt 168.9 lb

## 2015-08-11 DIAGNOSIS — C3491 Malignant neoplasm of unspecified part of right bronchus or lung: Secondary | ICD-10-CM

## 2015-08-11 DIAGNOSIS — Z5111 Encounter for antineoplastic chemotherapy: Secondary | ICD-10-CM | POA: Diagnosis not present

## 2015-08-11 DIAGNOSIS — C7802 Secondary malignant neoplasm of left lung: Secondary | ICD-10-CM

## 2015-08-11 DIAGNOSIS — K219 Gastro-esophageal reflux disease without esophagitis: Secondary | ICD-10-CM

## 2015-08-11 DIAGNOSIS — M1990 Unspecified osteoarthritis, unspecified site: Secondary | ICD-10-CM

## 2015-08-11 DIAGNOSIS — I10 Essential (primary) hypertension: Secondary | ICD-10-CM

## 2015-08-11 DIAGNOSIS — R0609 Other forms of dyspnea: Secondary | ICD-10-CM

## 2015-08-11 DIAGNOSIS — J910 Malignant pleural effusion: Secondary | ICD-10-CM | POA: Diagnosis not present

## 2015-08-11 DIAGNOSIS — C7931 Secondary malignant neoplasm of brain: Secondary | ICD-10-CM

## 2015-08-11 DIAGNOSIS — Z79899 Other long term (current) drug therapy: Secondary | ICD-10-CM

## 2015-08-11 DIAGNOSIS — E7800 Pure hypercholesterolemia, unspecified: Secondary | ICD-10-CM

## 2015-08-11 DIAGNOSIS — M25561 Pain in right knee: Secondary | ICD-10-CM

## 2015-08-11 DIAGNOSIS — Z87891 Personal history of nicotine dependence: Secondary | ICD-10-CM

## 2015-08-11 DIAGNOSIS — C3490 Malignant neoplasm of unspecified part of unspecified bronchus or lung: Secondary | ICD-10-CM

## 2015-08-11 LAB — CBC WITH DIFFERENTIAL/PLATELET
Basophils Absolute: 0.1 10*3/uL (ref 0–0.1)
Basophils Relative: 1 %
EOS ABS: 0.1 10*3/uL (ref 0–0.7)
EOS PCT: 3 %
HCT: 33.1 % — ABNORMAL LOW (ref 35.0–47.0)
HEMOGLOBIN: 11 g/dL — AB (ref 12.0–16.0)
LYMPHS ABS: 1.9 10*3/uL (ref 1.0–3.6)
LYMPHS PCT: 36 %
MCH: 28.8 pg (ref 26.0–34.0)
MCHC: 33.3 g/dL (ref 32.0–36.0)
MCV: 86.4 fL (ref 80.0–100.0)
MONOS PCT: 11 %
Monocytes Absolute: 0.6 10*3/uL (ref 0.2–0.9)
Neutro Abs: 2.6 10*3/uL (ref 1.4–6.5)
Neutrophils Relative %: 49 %
PLATELETS: 243 10*3/uL (ref 150–440)
RBC: 3.83 MIL/uL (ref 3.80–5.20)
RDW: 14.5 % (ref 11.5–14.5)
WBC: 5.2 10*3/uL (ref 3.6–11.0)

## 2015-08-11 LAB — COMPREHENSIVE METABOLIC PANEL
ALK PHOS: 53 U/L (ref 38–126)
ALT: 50 U/L (ref 14–54)
ANION GAP: 6 (ref 5–15)
AST: 36 U/L (ref 15–41)
Albumin: 3.7 g/dL (ref 3.5–5.0)
BUN: 15 mg/dL (ref 6–20)
CALCIUM: 9.4 mg/dL (ref 8.9–10.3)
CO2: 28 mmol/L (ref 22–32)
CREATININE: 0.6 mg/dL (ref 0.44–1.00)
Chloride: 106 mmol/L (ref 101–111)
Glucose, Bld: 98 mg/dL (ref 65–99)
Potassium: 3.3 mmol/L — ABNORMAL LOW (ref 3.5–5.1)
SODIUM: 140 mmol/L (ref 135–145)
Total Bilirubin: 1 mg/dL (ref 0.3–1.2)
Total Protein: 7 g/dL (ref 6.5–8.1)

## 2015-08-11 MED ORDER — HEPARIN SOD (PORK) LOCK FLUSH 100 UNIT/ML IV SOLN
500.0000 [IU] | Freq: Once | INTRAVENOUS | Status: AC | PRN
  Administered 2015-08-11: 500 [IU]
  Filled 2015-08-11: qty 5

## 2015-08-11 MED ORDER — SODIUM CHLORIDE 0.9 % IV SOLN
500.0000 mg/m2 | Freq: Once | INTRAVENOUS | Status: AC
  Administered 2015-08-11: 925 mg via INTRAVENOUS
  Filled 2015-08-11: qty 37

## 2015-08-11 MED ORDER — CYANOCOBALAMIN 1000 MCG/ML IJ SOLN
1000.0000 ug | Freq: Once | INTRAMUSCULAR | Status: AC
  Administered 2015-08-11: 1000 ug via INTRAMUSCULAR
  Filled 2015-08-11: qty 1

## 2015-08-11 MED ORDER — SODIUM CHLORIDE 0.9 % IV SOLN
Freq: Once | INTRAVENOUS | Status: AC
  Administered 2015-08-11: 11:00:00 via INTRAVENOUS
  Filled 2015-08-11: qty 1000

## 2015-08-11 MED ORDER — HEPARIN SOD (PORK) LOCK FLUSH 100 UNIT/ML IV SOLN: 500.0000 [IU] | Freq: Once | INTRAVENOUS | Status: DC

## 2015-08-11 MED ORDER — SODIUM CHLORIDE 0.9 % IV SOLN
Freq: Once | INTRAVENOUS | Status: AC
  Administered 2015-08-11: 12:00:00 via INTRAVENOUS
  Filled 2015-08-11: qty 4

## 2015-08-11 MED ORDER — SODIUM CHLORIDE 0.9 % IJ SOLN
10.0000 mL | INTRAMUSCULAR | Status: DC | PRN
  Administered 2015-08-11: 10 mL via INTRAVENOUS
  Filled 2015-08-11: qty 10

## 2015-08-11 MED ORDER — POTASSIUM CHLORIDE ER 10 MEQ PO TBCR: 10.0000 meq | EXTENDED_RELEASE_TABLET | Freq: Every day | ORAL | Status: DC

## 2015-08-11 NOTE — Progress Notes (Signed)
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  12/01/14 - Right Pleural Fluid Cytology. POSITIVE FOR MALIGNANT CELLS. ADENOCARCINOMA, CONSISTENT WITH LUNG ORIGIN.   Molecular testing for EGFR, ALK and others is negative.  Patient started palliative chemotherapy with Alimta/carboplatin on 12/14/14, got cycle 6 on 04/06/15, now on single agent maintenance Alimta.   HISTORY OF PRESENT ILLNESS:  Patient returns for further evaluation and consideration of her next infusion of pemetrexed. She is tolerating her treatments well without significant side effects. She denies any recent fevers. She has no neurologic complaints. She continues to have dyspnea on exertion which is unchanged. She also reports having arthritic pain in right knee when exercising with husband. Otherwise she feels very well and denies any other complaints.   REVIEW OF SYSTEMS:   Review of Systems  Constitutional: Negative for fever, chills, weight loss and malaise/fatigue.  HENT: Negative for congestion, nosebleeds and sore throat.   Respiratory: Positive for shortness of breath. Negative for cough, hemoptysis and sputum production.        With heavy exercise only  Cardiovascular: Negative for chest pain and leg swelling.  Gastrointestinal: Positive for constipation. Negative for nausea, vomiting, abdominal pain, diarrhea, blood in stool and melena.       Using Miralax  Genitourinary: Negative for urgency and frequency.  Musculoskeletal: Positive for joint pain.       Right knee, arthritic type pain  Skin: Negative for itching and rash.  Neurological: Negative.  Negative for  weakness.  Psychiatric/Behavioral: The patient has insomnia.        Using xanax to help sleep at night    PAST MEDICAL HISTORY: Reviewed Past Medical History  Diagnosis Date  . Lung cancer ([place])   . Hypertension   . Hypercholesteremia   . Metastatic lung cancer (metastasis from lung to other site) ([place]) 01/21/2015  . Anemia 01/21/2015  . 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 GERD (gastroesophageal reflux disease)           Hypertension Osteoarthritis Breast lump removal in [place] about 10 years ago reportedly benign Partial hysterectomy 20 years ago, denies malignancy  Stage IV non-small cell right lung cancer with malignant effusion and left lung metastasis diagnosed in March 2016.   PAST SURGICAL HISTORY:Reviewed As above  FAMILY HISTORY:Reviewed Family History  Problem Relation Age of Onset  . Cervical cancer Mother   . Uterine cancer Maternal Grandmother     SOCIAL HISTORY: Reviewed Social History  Substance Use Topics  . Smoking status: Former Smoker -- 0.25 packs/day for 8 years    Types: Cigarettes    Quit date: 07/20/2014  . Smokeless tobacco: Not on file  . Alcohol Use: No    Allergies  Allergen Reactions  . Tegaderm Ag Mesh [Silver] Other (See Comments)    blisters    Current Outpatient Prescriptions  Medication Sig Dispense Refill  . ALPRAZolam (XANAX) 0.5 MG tablet Take 1 tablet (0.5 mg total) by mouth at bedtime as needed for anxiety. 30 tablet 0  . ferrous sulfate 325 (65 FE) MG tablet Take 325 mg by mouth daily with breakfast.    . furosemide (LASIX) 20 MG tablet Take 1  tablet daily as needed for swelling. 30 tablet 2  . HYDROcodone-acetaminophen (NORCO) 5-325 MG per tablet Take 1 tablet by mouth every 6 (six) hours as needed for moderate pain. 90 tablet 0  . lidocaine-prilocaine (EMLA) cream Apply cream 1 hour before chemotherapy treatment 30 g 1  . lisinopril-hydrochlorothiazide (PRINZIDE,ZESTORETIC) 10-12.5 MG  per tablet Take 1 tablet by mouth daily.    . Multiple Vitamins-Minerals (MULTIVITAMIN WITH MINERALS) tablet Take 1 tablet by mouth daily. 30 tablet 3  . omeprazole (PRILOSEC) 20 MG capsule Take 20 mg by mouth every morning.     . potassium chloride (K-DUR) 10 MEQ tablet Take 1 tablet daily for 6 days and then only take 1 daily as needed when you take furosemide tablet 36 tablet 0  . promethazine (PHENERGAN) 25 MG tablet Take 25 mg by mouth every 6 (six) hours as needed for nausea or vomiting.    . simvastatin (ZOCOR) 20 MG tablet Take 20 mg by mouth at bedtime.      No current facility-administered medications for this visit.   Facility-Administered Medications Ordered in Other Visits  Medication Dose Route Frequency Provider Last Rate Last Dose  . heparin lock flush 100 unit/mL  250 Units Intracatheter PRN Evlyn Kanner, NP   250 Units at 01/21/15 1734  . heparin lock flush 100 unit/mL  500 Units Intracatheter Once PRN Leia Alf, MD      . heparin lock flush 100 unit/mL  500 Units Intravenous Once Cammie Sickle, MD      . sodium chloride 0.9 % injection 10 mL  10 mL Intracatheter PRN Evlyn Kanner, NP   10 mL at 01/21/15 1734  . sodium chloride 0.9 % injection 10 mL  10 mL Intravenous PRN Leia Alf, MD   10 mL at 04/27/15 0905  . sodium chloride 0.9 % injection 10 mL  10 mL Intracatheter PRN Leia Alf, MD      . sodium chloride 0.9 % injection 10 mL  10 mL Intravenous PRN Cammie Sickle, MD   10 mL at 08/11/15 0948  . sodium chloride 0.9 % injection 3 mL  3 mL Intracatheter PRN Evlyn Kanner, NP   3 mL at 01/21/15 1731    OBJECTIVE: Filed Vitals:   08/11/15 1003  BP: 120/75  Pulse: 70  Temp: 95.5 F (35.3 C)  Resp: 18     Body mass index is 28.1 kg/(m^2).    ECOG FS:1 - Symptomatic but completely ambulatory   General: Well-developed, well-nourished, no acute distress. Eyes: Pink conjunctiva, anicteric sclera. Lungs: Right upper lobe with  inspiratory wheeze, otherwise clear to auscultation bilaterally. Heart: Regular rate and rhythm. No rubs, murmurs, or gallops. Abdomen: Soft, nontender, nondistended. No organomegaly noted, normoactive bowel sounds. Musculoskeletal: No edema, cyanosis, or clubbing. Right knee pain. Neuro: Alert, answering all questions appropriately. Cranial nerves grossly intact. Skin: No rashes or petechiae noted. Psych: Normal affect.   LAB RESULTS: WBC 5, Hb 11, plt 292, ANC 2.4, Cr 0.68.    Component Value Date/Time   NA 135 07/20/2015 1119   NA 138 01/11/2015 0929   K 3.3* 07/20/2015 1119   K 3.4* 01/11/2015 0929   CL 104 07/20/2015 1119   CL 104 01/11/2015 0929   CO2 27 07/20/2015 1119   CO2 25 01/11/2015 0929   GLUCOSE 109* 07/20/2015 1119   GLUCOSE 142* 01/11/2015 0929   BUN 17 07/20/2015 1119   BUN 9 01/11/2015 0929   CREATININE 0.69 07/20/2015 1119  CREATININE 0.80 01/11/2015 0929   CALCIUM 8.6* 07/20/2015 1119   CALCIUM 7.9* 01/11/2015 0929   PROT 7.2 07/20/2015 1119   PROT 5.3* 12/30/2014 0519   ALBUMIN 3.7 07/20/2015 1119   ALBUMIN 1.9* 12/30/2014 0519   AST 41 07/20/2015 1119   AST 24 12/30/2014 0519   ALT 53 07/20/2015 1119   ALT 46 12/30/2014 0519   ALKPHOS 57 07/20/2015 1119   ALKPHOS 87 12/30/2014 0519   BILITOT 1.0 07/20/2015 1119   BILITOT 1.9* 12/30/2014 0519   GFRNONAA >60 07/20/2015 1119   GFRNONAA >60 01/11/2015 0929   GFRAA >60 07/20/2015 1119   GFRAA >60 01/11/2015 0929       STUDIES: 12/01/14 - Right Pleural Fluid Cytology. POSITIVE FOR MALIGNANT CELLS. ADENOCARCINOMA, CONSISTENT WITH LUNG ORIGIN.  12/11/14 - CT head. IMPRESSION: 1. Multiple bilateral enhancing mass lesions compatible with metastatic disease to the brain. MRI would be more sensitive for small lesions if delineation of the specific number of lesions is important for the patient's treatment. 2. No evidence for hemorrhage or significant mass effect.  12/30/14 - CT Chest. IMPRESSION: 1.  Decreased right hydropneumothorax with removal of right pleural catheter. Small residual subpulmonic fluid component persists with scattered foci of extrapleural air. Some improved aeration of the right lower lobe,, however diffuse lower lobe and perihilar right middle lobe consolidation persists. It is difficult to differentiate malignancy from atelectasis or pneumonia in the setting, however patient has had recent PET which better characterized this. 2. Diffuse pleural thickening and nodularity in the right hemithorax. Diffuse pulmonary metastatic disease.  03/08/15 - CT Chest. IMPRESSION:  1. Overall mild partial interval response to therapy. Tumor involving the right lower lobe appears decreased from previous exam with associated improved aeration to the right lung. 2. Numerous pulmonary nodules are again noted within both lungs consistent with metastatic disease. These appear stable in the interval. 3. Resolution of previous right supraclavicular and right paratracheal adenopathy. 4. Aortic atherosclerosis.  04/20/15 - CT scan of Chest.  IMPRESSION:  1. Mild partial interval treatment response. Decreased size of spiculated central right lower lobe lung mass, likely the primary tumor. 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 Stable to mildly decreased pulmonary metastases.  2. Mildly increased small circumferential right pleural effusion.  3. Stable mild right hilar and right pericardiophrenic lymphadenopathy.  4. Stable indeterminate hypodense 1.2 cm left upper renal lesion.   ASSESSMENT / PLAN:    1. 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 On 12/01/14 - Right Pleural Fluid Cytology. POSITIVE FOR MALIGNANT CELLS. ADENOCARCINOMA, CONSISTENT WITH LUNG ORIGIN. Molecular testing for EGFR, ALK and others is negative.  Proceed with cycle 6  single agent Alimta 500 mg/m2 IV today. Patient will also receive B 12 injections with every treatment. Return to clinic in 3 weeks to review imaging and for consideration of cycle 7.  2. 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 3. Pain. Continue Norco prn.    Dr. Rogue Bussing was available for consultation and review of plan of care for this patient.  Evlyn Kanner, NP   08/11/2015 10:24 AM

## 2015-08-24 ENCOUNTER — Ambulatory Visit
Admission: RE | Admit: 2015-08-24 | Discharge: 2015-08-24 | Disposition: A | Discharge status: Home / Self Care | Payer: 59 | Source: Ambulatory Visit | Attending: Oncology | Admitting: Oncology

## 2015-08-24 DIAGNOSIS — C3490 Malignant neoplasm of unspecified part of unspecified bronchus or lung: Secondary | ICD-10-CM | POA: Insufficient documentation

## 2015-08-24 DIAGNOSIS — Z923 Personal history of irradiation: Secondary | ICD-10-CM | POA: Diagnosis not present

## 2015-08-24 DIAGNOSIS — N289 Disorder of kidney and ureter, unspecified: Secondary | ICD-10-CM | POA: Insufficient documentation

## 2015-08-24 DIAGNOSIS — J910 Malignant pleural effusion: Secondary | ICD-10-CM | POA: Insufficient documentation

## 2015-08-24 DIAGNOSIS — R918 Other nonspecific abnormal finding of lung field: Secondary | ICD-10-CM | POA: Diagnosis not present

## 2015-08-24 DIAGNOSIS — Z9221 Personal history of antineoplastic chemotherapy: Secondary | ICD-10-CM | POA: Insufficient documentation

## 2015-08-24 DIAGNOSIS — I7090 Unspecified atherosclerosis: Secondary | ICD-10-CM | POA: Insufficient documentation

## 2015-08-24 MED ORDER — IOHEXOL 300 MG/ML  SOLN
100.0000 mL | Freq: Once | INTRAMUSCULAR | Status: AC | PRN
  Administered 2015-08-24: 100 mL via INTRAVENOUS

## 2015-08-27 ENCOUNTER — Other Ambulatory Visit: Payer: Self-pay | Admitting: Oncology

## 2015-08-30 ENCOUNTER — Telehealth: Payer: Self-pay | Admitting: *Deleted

## 2015-08-30 NOTE — Telephone Encounter (Signed)
Phoned in.

## 2015-09-01 ENCOUNTER — Inpatient Hospital Stay (HOSPITAL_BASED_OUTPATIENT_CLINIC_OR_DEPARTMENT_OTHER): Payer: 59 | Admitting: Oncology

## 2015-09-01 ENCOUNTER — Inpatient Hospital Stay: Payer: 59

## 2015-09-01 ENCOUNTER — Inpatient Hospital Stay: Payer: 59 | Attending: Oncology

## 2015-09-01 VITALS — BP 129/87 | HR 81 | Temp 97.3°F | Resp 18 | Wt 168.4 lb

## 2015-09-01 DIAGNOSIS — K219 Gastro-esophageal reflux disease without esophagitis: Secondary | ICD-10-CM

## 2015-09-01 DIAGNOSIS — Z923 Personal history of irradiation: Secondary | ICD-10-CM

## 2015-09-01 DIAGNOSIS — E7800 Pure hypercholesterolemia, unspecified: Secondary | ICD-10-CM | POA: Insufficient documentation

## 2015-09-01 DIAGNOSIS — R0609 Other forms of dyspnea: Secondary | ICD-10-CM

## 2015-09-01 DIAGNOSIS — I10 Essential (primary) hypertension: Secondary | ICD-10-CM | POA: Insufficient documentation

## 2015-09-01 DIAGNOSIS — Z8041 Family history of malignant neoplasm of ovary: Secondary | ICD-10-CM | POA: Insufficient documentation

## 2015-09-01 DIAGNOSIS — C7931 Secondary malignant neoplasm of brain: Secondary | ICD-10-CM

## 2015-09-01 DIAGNOSIS — C7802 Secondary malignant neoplasm of left lung: Secondary | ICD-10-CM

## 2015-09-01 DIAGNOSIS — N289 Disorder of kidney and ureter, unspecified: Secondary | ICD-10-CM

## 2015-09-01 DIAGNOSIS — C3490 Malignant neoplasm of unspecified part of unspecified bronchus or lung: Secondary | ICD-10-CM

## 2015-09-01 DIAGNOSIS — Z5111 Encounter for antineoplastic chemotherapy: Secondary | ICD-10-CM | POA: Insufficient documentation

## 2015-09-01 DIAGNOSIS — Z79899 Other long term (current) drug therapy: Secondary | ICD-10-CM

## 2015-09-01 DIAGNOSIS — Z87891 Personal history of nicotine dependence: Secondary | ICD-10-CM | POA: Diagnosis not present

## 2015-09-01 DIAGNOSIS — C3491 Malignant neoplasm of unspecified part of right bronchus or lung: Secondary | ICD-10-CM | POA: Insufficient documentation

## 2015-09-01 DIAGNOSIS — J910 Malignant pleural effusion: Secondary | ICD-10-CM | POA: Insufficient documentation

## 2015-09-01 LAB — COMPREHENSIVE METABOLIC PANEL
ALT: 54 U/L (ref 14–54)
AST: 42 U/L — AB (ref 15–41)
Albumin: 3.7 g/dL (ref 3.5–5.0)
Alkaline Phosphatase: 64 U/L (ref 38–126)
Anion gap: 6 (ref 5–15)
BUN: 24 mg/dL — AB (ref 6–20)
CHLORIDE: 102 mmol/L (ref 101–111)
CO2: 27 mmol/L (ref 22–32)
Calcium: 9.1 mg/dL (ref 8.9–10.3)
Creatinine, Ser: 0.77 mg/dL (ref 0.44–1.00)
Glucose, Bld: 94 mg/dL (ref 65–99)
POTASSIUM: 3.4 mmol/L — AB (ref 3.5–5.1)
Sodium: 135 mmol/L (ref 135–145)
Total Bilirubin: 0.5 mg/dL (ref 0.3–1.2)
Total Protein: 7.1 g/dL (ref 6.5–8.1)

## 2015-09-01 LAB — CBC WITH DIFFERENTIAL/PLATELET
BASOS ABS: 0 10*3/uL (ref 0–0.1)
Basophils Relative: 1 %
EOS PCT: 2 %
Eosinophils Absolute: 0.1 10*3/uL (ref 0–0.7)
HCT: 34.6 % — ABNORMAL LOW (ref 35.0–47.0)
Hemoglobin: 11.4 g/dL — ABNORMAL LOW (ref 12.0–16.0)
LYMPHS PCT: 29 %
Lymphs Abs: 1.6 10*3/uL (ref 1.0–3.6)
MCH: 28.3 pg (ref 26.0–34.0)
MCHC: 32.8 g/dL (ref 32.0–36.0)
MCV: 86.3 fL (ref 80.0–100.0)
MONO ABS: 0.6 10*3/uL (ref 0.2–0.9)
Monocytes Relative: 11 %
Neutro Abs: 3.2 10*3/uL (ref 1.4–6.5)
Neutrophils Relative %: 57 %
PLATELETS: 266 10*3/uL (ref 150–440)
RBC: 4.01 MIL/uL (ref 3.80–5.20)
RDW: 14 % (ref 11.5–14.5)
WBC: 5.6 10*3/uL (ref 3.6–11.0)

## 2015-09-01 MED ORDER — SODIUM CHLORIDE 0.9 % IV SOLN
Freq: Once | INTRAVENOUS | Status: AC
  Administered 2015-09-01: 11:00:00 via INTRAVENOUS
  Filled 2015-09-01: qty 4

## 2015-09-01 MED ORDER — SODIUM CHLORIDE 0.9 % IJ SOLN
10.0000 mL | INTRAMUSCULAR | Status: DC | PRN
  Filled 2015-09-01: qty 10

## 2015-09-01 MED ORDER — SODIUM CHLORIDE 0.9 % IV SOLN
Freq: Once | INTRAVENOUS | Status: AC
  Administered 2015-09-01: 11:00:00 via INTRAVENOUS
  Filled 2015-09-01: qty 1000

## 2015-09-01 MED ORDER — CYANOCOBALAMIN 1000 MCG/ML IJ SOLN
1000.0000 ug | Freq: Once | INTRAMUSCULAR | Status: AC
  Administered 2015-09-01: 1000 ug via INTRAMUSCULAR
  Filled 2015-09-01: qty 1

## 2015-09-01 MED ORDER — SODIUM CHLORIDE 0.9 % IV SOLN
500.0000 mg/m2 | Freq: Once | INTRAVENOUS | Status: AC
  Administered 2015-09-01: 925 mg via INTRAVENOUS
  Filled 2015-09-01: qty 37

## 2015-09-01 MED ORDER — CYANOCOBALAMIN 1000 MCG/ML IJ SOLN: 1000.0000 ug | Freq: Once | INTRAMUSCULAR | Status: AC

## 2015-09-01 MED ORDER — HEPARIN SOD (PORK) LOCK FLUSH 100 UNIT/ML IV SOLN
500.0000 [IU] | Freq: Once | INTRAVENOUS | Status: AC
  Administered 2015-09-01: 500 [IU] via INTRAVENOUS
  Filled 2015-09-01: qty 5

## 2015-09-01 NOTE — Progress Notes (Signed)
Patient is feeling well today and no longer needing the Marinol because he appetite has improved.  Has been taking Potassium supplement daily and would like to know if she needs to continue.

## 2015-09-09 NOTE — Progress Notes (Signed)
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  12/01/14 - Right Pleural Fluid Cytology. POSITIVE FOR MALIGNANT CELLS. ADENOCARCINOMA, CONSISTENT WITH LUNG ORIGIN.   Molecular testing for EGFR, ALK and others is negative.  Patient started palliative chemotherapy with Alimta/carboplatin on 12/14/14, got cycle 6 on 04/06/15, now on single agent maintenance Alimta.   HISTORY OF PRESENT ILLNESS:  Patient returns for further evaluation and consideration of her next infusion of pemetrexed. She is tolerating her treatments well without significant side effects. Her appetite has improved and she no longer uses Marinol. She denies any recent fevers. She has no neurologic complaints. She continues to have dyspnea on exertion which is unchanged. She denies any chest pain. She denies any nausea, vomiting, constipation, or diarrhea. Patient offers no specific complaints today.    REVIEW OF SYSTEMS:   Review of Systems  Constitutional: Negative for fever and malaise/fatigue.  Respiratory: Positive for shortness of breath.   Cardiovascular: Negative.   Gastrointestinal: Negative.   Neurological: Negative.     PAST MEDICAL HISTORY: Reviewed Past Medical History  Diagnosis Date  . Lung cancer ([place])   . Hypertension   . Hypercholesteremia   . Metastatic lung cancer (metastasis from lung to other site) ([place]) 01/21/2015  . Anemia 01/21/2015  . 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 GERD (gastroesophageal reflux disease)           Hypertension Osteoarthritis Breast lump removal in [place] about 10 years ago reportedly  benign Partial hysterectomy 20 years ago, denies malignancy  Stage IV non-small cell right lung cancer with malignant effusion and left lung metastasis diagnosed in March 2016.   PAST SURGICAL HISTORY:Reviewed As above  FAMILY HISTORY:Reviewed Family History  Problem Relation Age of Onset  . Cervical cancer Mother   . Uterine cancer Maternal Grandmother     SOCIAL HISTORY: Reviewed Social History  Substance Use Topics  . Smoking status: Former Smoker -- 0.25 packs/day for 8 years    Types: Cigarettes    Quit date: 07/20/2014  . Smokeless tobacco: Not on file  . Alcohol Use: No    Allergies  Allergen Reactions  . Tegaderm Ag Mesh [Silver] Other (See Comments)    blisters    Current Outpatient Prescriptions  Medication Sig Dispense Refill  . ALPRAZolam (XANAX) 0.5 MG tablet TAKE ONE TABLET BY MOUTH AT BEDTIME AS NEEDED FOR ANXIETY 30 tablet 0  . ferrous sulfate 325 (65 FE) MG tablet Take 325 mg by mouth daily with breakfast.    . furosemide (LASIX) 20 MG tablet Take 1 tablet daily as needed for swelling. 30 tablet 2  . HYDROcodone-acetaminophen (NORCO) 5-325 MG per tablet Take 1 tablet by mouth every 6 (six) hours as needed for moderate pain. 90 tablet 0  . lidocaine-prilocaine (EMLA) cream Apply cream 1 hour before chemotherapy treatment 30 g 1  . lisinopril-hydrochlorothiazide (PRINZIDE,ZESTORETIC) 10-12.5 MG per tablet Take 1 tablet by mouth daily.    . Multiple Vitamins-Minerals (MULTIVITAMIN WITH MINERALS) tablet Take 1 tablet by mouth daily. 30 tablet 3  . omeprazole (PRILOSEC) 20 MG capsule Take 20 mg by mouth every morning.     . potassium chloride (K-DUR) 10 MEQ  tablet Take 1 tablet (10 mEq total) by mouth daily. Take 1 tablet daily for 6 days and then only take 1 daily as needed when you take furosemide tablet 36 tablet 0  . promethazine (PHENERGAN) 25 MG tablet Take 25 mg by mouth every 6 (six) hours as needed for nausea or vomiting.    .  simvastatin (ZOCOR) 20 MG tablet Take 20 mg by mouth at bedtime.      No current facility-administered medications for this visit.   Facility-Administered Medications Ordered in Other Visits  Medication Dose Route Frequency Provider Last Rate Last Dose  . heparin lock flush 100 unit/mL  250 Units Intracatheter PRN Evlyn Kanner, NP   250 Units at 01/21/15 1734  . heparin lock flush 100 unit/mL  500 Units Intracatheter Once PRN Leia Alf, MD      . sodium chloride 0.9 % injection 10 mL  10 mL Intracatheter PRN Evlyn Kanner, NP   10 mL at 01/21/15 1734  . sodium chloride 0.9 % injection 10 mL  10 mL Intravenous PRN Leia Alf, MD   10 mL at 04/27/15 0905  . sodium chloride 0.9 % injection 10 mL  10 mL Intracatheter PRN Leia Alf, MD      . sodium chloride 0.9 % injection 3 mL  3 mL Intracatheter PRN Evlyn Kanner, NP   3 mL at 01/21/15 1731    OBJECTIVE: Filed Vitals:   09/01/15 1009  BP: 129/87  Pulse: 81  Temp: 97.3 F (36.3 C)  Resp: 18     Body mass index is 28.03 kg/(m^2).    ECOG FS:1 - Symptomatic but completely ambulatory   General: Well-developed, well-nourished, no acute distress. Eyes: Pink conjunctiva, anicteric sclera. Lungs: Clear to auscultation bilaterally. Heart: Regular rate and rhythm. No rubs, murmurs, or gallops. Abdomen: Soft, nontender, nondistended. No organomegaly noted, normoactive bowel sounds. Musculoskeletal: No edema, cyanosis, or clubbing. Neuro: Alert, answering all questions appropriately. Cranial nerves grossly intact. Skin: No rashes or petechiae noted. Psych: Normal affect.   LAB RESULTS: WBC 5, Hb 11, plt 292, ANC 2.4, Cr 0.68.    Component Value Date/Time   NA 135 09/01/2015 0941   NA 138 01/11/2015 0929   K 3.4* 09/01/2015 0941   K 3.4* 01/11/2015 0929   CL 102 09/01/2015 0941   CL 104 01/11/2015 0929   CO2 27 09/01/2015 0941   CO2 25 01/11/2015 0929   GLUCOSE 94 09/01/2015 0941   GLUCOSE 142* 01/11/2015  0929   BUN 24* 09/01/2015 0941   BUN 9 01/11/2015 0929   CREATININE 0.77 09/01/2015 0941   CREATININE 0.80 01/11/2015 0929   CALCIUM 9.1 09/01/2015 0941   CALCIUM 7.9* 01/11/2015 0929   PROT 7.1 09/01/2015 0941   PROT 5.3* 12/30/2014 0519   ALBUMIN 3.7 09/01/2015 0941   ALBUMIN 1.9* 12/30/2014 0519   AST 42* 09/01/2015 0941   AST 24 12/30/2014 0519   ALT 54 09/01/2015 0941   ALT 46 12/30/2014 0519   ALKPHOS 64 09/01/2015 0941   ALKPHOS 87 12/30/2014 0519   BILITOT 0.5 09/01/2015 0941   BILITOT 1.9* 12/30/2014 0519   GFRNONAA >60 09/01/2015 0941   GFRNONAA >60 01/11/2015 0929   GFRAA >60 09/01/2015 0941   GFRAA >60 01/11/2015 0929       STUDIES: 12/01/14 - Right Pleural Fluid Cytology. POSITIVE FOR MALIGNANT CELLS. ADENOCARCINOMA, CONSISTENT WITH LUNG ORIGIN.  12/11/14 - CT head. IMPRESSION: 1. Multiple bilateral enhancing mass lesions compatible with metastatic disease to  the brain. MRI would be more sensitive for small lesions if delineation of the specific number of lesions is important for the patient's treatment. 2. No evidence for hemorrhage or significant mass effect.  12/30/14 - CT Chest. IMPRESSION: 1. Decreased right hydropneumothorax with removal of right pleural catheter. Small residual subpulmonic fluid component persists with scattered foci of extrapleural air. Some improved aeration of the right lower lobe,, however diffuse lower lobe and perihilar right middle lobe consolidation persists. It is difficult to differentiate malignancy from atelectasis or pneumonia in the setting, however patient has had recent PET which better characterized this. 2. Diffuse pleural thickening and nodularity in the right hemithorax. Diffuse pulmonary metastatic disease.  03/08/15 - CT Chest. IMPRESSION:  1. Overall mild partial interval response to therapy. Tumor involving the right lower lobe appears decreased from previous exam with associated improved aeration to the right lung. 2.  Numerous pulmonary nodules are again noted within both lungs consistent with metastatic disease. These appear stable in the interval. 3. Resolution of previous right supraclavicular and right paratracheal adenopathy. 4. Aortic atherosclerosis.  04/20/15 - CT scan of Chest.  IMPRESSION:  1. Mild partial interval treatment response. Decreased size of spiculated central right lower lobe lung mass, likely the primary tumor. 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 Stable to mildly decreased pulmonary metastases.  2. Mildly increased small circumferential right pleural effusion.  3. Stable mild right hilar and right pericardiophrenic lymphadenopathy.  4. Stable indeterminate hypodense 1.2 cm left upper renal lesion.   ASSESSMENT / PLAN:    1. 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 On 12/01/14 - Right Pleural Fluid Cytology. POSITIVE FOR MALIGNANT CELLS. ADENOCARCINOMA, CONSISTENT WITH LUNG ORIGIN. Molecular testing for EGFR, ALK and others is negative.   CT results reviewed independently and reported as above with a mixed response to treatment. Proceed with cycle 7 single agent Alimta 500 mg/m2 IV today. Patient will also receive B 12 injections with every treatment. Return to clinic in 3 weeks for consideration of cycle 8. Will repeat imaging in 3 months in approximately March 2017. Patient understands she can return to clinic at any time if she has any questions, concerns, or complaints.  2. 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 3. Pain - continue Norco prn. 4. Kidney lesion: Unclear if metastatic deposit or second primary of the left kidney. Continue to monitor closely.     Lloyd Huger, MD   09/09/2015 9:24 AM

## 2015-09-22 ENCOUNTER — Inpatient Hospital Stay (HOSPITAL_BASED_OUTPATIENT_CLINIC_OR_DEPARTMENT_OTHER): Payer: BLUE CROSS/BLUE SHIELD | Admitting: Oncology

## 2015-09-22 ENCOUNTER — Inpatient Hospital Stay: Payer: BLUE CROSS/BLUE SHIELD | Attending: Oncology

## 2015-09-22 ENCOUNTER — Inpatient Hospital Stay: Payer: BLUE CROSS/BLUE SHIELD

## 2015-09-22 VITALS — BP 102/66 | HR 83 | Temp 97.1°F | Resp 18 | Wt 170.6 lb

## 2015-09-22 DIAGNOSIS — Z79899 Other long term (current) drug therapy: Secondary | ICD-10-CM

## 2015-09-22 DIAGNOSIS — F419 Anxiety disorder, unspecified: Secondary | ICD-10-CM | POA: Diagnosis not present

## 2015-09-22 DIAGNOSIS — C3491 Malignant neoplasm of unspecified part of right bronchus or lung: Secondary | ICD-10-CM | POA: Insufficient documentation

## 2015-09-22 DIAGNOSIS — K219 Gastro-esophageal reflux disease without esophagitis: Secondary | ICD-10-CM | POA: Diagnosis not present

## 2015-09-22 DIAGNOSIS — G4709 Other insomnia: Secondary | ICD-10-CM | POA: Insufficient documentation

## 2015-09-22 DIAGNOSIS — Z5111 Encounter for antineoplastic chemotherapy: Secondary | ICD-10-CM | POA: Diagnosis not present

## 2015-09-22 DIAGNOSIS — E876 Hypokalemia: Secondary | ICD-10-CM | POA: Insufficient documentation

## 2015-09-22 DIAGNOSIS — E7800 Pure hypercholesterolemia, unspecified: Secondary | ICD-10-CM | POA: Diagnosis not present

## 2015-09-22 DIAGNOSIS — R0609 Other forms of dyspnea: Secondary | ICD-10-CM | POA: Diagnosis not present

## 2015-09-22 DIAGNOSIS — R51 Headache: Secondary | ICD-10-CM

## 2015-09-22 DIAGNOSIS — C7931 Secondary malignant neoplasm of brain: Secondary | ICD-10-CM

## 2015-09-22 DIAGNOSIS — M1990 Unspecified osteoarthritis, unspecified site: Secondary | ICD-10-CM | POA: Insufficient documentation

## 2015-09-22 DIAGNOSIS — N289 Disorder of kidney and ureter, unspecified: Secondary | ICD-10-CM

## 2015-09-22 DIAGNOSIS — C7802 Secondary malignant neoplasm of left lung: Secondary | ICD-10-CM

## 2015-09-22 DIAGNOSIS — J910 Malignant pleural effusion: Secondary | ICD-10-CM | POA: Diagnosis not present

## 2015-09-22 DIAGNOSIS — Z923 Personal history of irradiation: Secondary | ICD-10-CM | POA: Diagnosis not present

## 2015-09-22 DIAGNOSIS — Z8041 Family history of malignant neoplasm of ovary: Secondary | ICD-10-CM | POA: Insufficient documentation

## 2015-09-22 DIAGNOSIS — Z87891 Personal history of nicotine dependence: Secondary | ICD-10-CM | POA: Diagnosis not present

## 2015-09-22 DIAGNOSIS — C3490 Malignant neoplasm of unspecified part of unspecified bronchus or lung: Secondary | ICD-10-CM

## 2015-09-22 DIAGNOSIS — I10 Essential (primary) hypertension: Secondary | ICD-10-CM

## 2015-09-22 LAB — COMPREHENSIVE METABOLIC PANEL
ALBUMIN: 3.8 g/dL (ref 3.5–5.0)
ALK PHOS: 71 U/L (ref 38–126)
ALT: 52 U/L (ref 14–54)
ANION GAP: 5 (ref 5–15)
AST: 34 U/L (ref 15–41)
BILIRUBIN TOTAL: 0.6 mg/dL (ref 0.3–1.2)
BUN: 17 mg/dL (ref 6–20)
CALCIUM: 9.2 mg/dL (ref 8.9–10.3)
CO2: 28 mmol/L (ref 22–32)
Chloride: 102 mmol/L (ref 101–111)
Creatinine, Ser: 0.83 mg/dL (ref 0.44–1.00)
GFR calc non Af Amer: >60 mL/min (ref >60)
Glucose, Bld: 106 mg/dL — ABNORMAL HIGH (ref 65–99)
POTASSIUM: 3.4 mmol/L — AB (ref 3.5–5.1)
SODIUM: 135 mmol/L (ref 135–145)
TOTAL PROTEIN: 7.2 g/dL (ref 6.5–8.1)

## 2015-09-22 LAB — CBC WITH DIFFERENTIAL/PLATELET
BASOS PCT: 1 %
Basophils Absolute: 0 10*3/uL (ref 0–0.1)
EOS ABS: 0.2 10*3/uL (ref 0–0.7)
Eosinophils Relative: 3 %
HCT: 35.1 % (ref 35.0–47.0)
Hemoglobin: 11.6 g/dL — ABNORMAL LOW (ref 12.0–16.0)
LYMPHS ABS: 1.4 10*3/uL (ref 1.0–3.6)
Lymphocytes Relative: 26 %
MCH: 28.3 pg (ref 26.0–34.0)
MCHC: 33.2 g/dL (ref 32.0–36.0)
MCV: 85.4 fL (ref 80.0–100.0)
MONO ABS: 0.7 10*3/uL (ref 0.2–0.9)
MONOS PCT: 14 %
Neutro Abs: 2.9 10*3/uL (ref 1.4–6.5)
Neutrophils Relative %: 56 %
Platelets: 231 10*3/uL (ref 150–440)
RBC: 4.11 MIL/uL (ref 3.80–5.20)
RDW: 14 % (ref 11.5–14.5)
WBC: 5.2 10*3/uL (ref 3.6–11.0)

## 2015-09-22 MED ORDER — SODIUM CHLORIDE 0.9 % IV SOLN
Freq: Once | INTRAVENOUS | Status: AC
  Administered 2015-09-22: 12:00:00 via INTRAVENOUS
  Filled 2015-09-22: qty 4

## 2015-09-22 MED ORDER — LIDOCAINE-PRILOCAINE 2.5-2.5 % EX CREA: TOPICAL_CREAM | CUTANEOUS | Status: DC

## 2015-09-22 MED ORDER — HEPARIN SOD (PORK) LOCK FLUSH 100 UNIT/ML IV SOLN
500.0000 [IU] | Freq: Once | INTRAVENOUS | Status: AC | PRN
  Administered 2015-09-22: 500 [IU]
  Filled 2015-09-22: qty 5

## 2015-09-22 MED ORDER — ALPRAZOLAM 1 MG PO TABS: 1.0000 mg | ORAL_TABLET | Freq: Every evening | ORAL | Status: DC | PRN

## 2015-09-22 MED ORDER — SODIUM CHLORIDE 0.9 % IV SOLN
Freq: Once | INTRAVENOUS | Status: AC
  Administered 2015-09-22: 11:00:00 via INTRAVENOUS
  Filled 2015-09-22: qty 1000

## 2015-09-22 MED ORDER — SODIUM CHLORIDE 0.9 % IJ SOLN
10.0000 mL | INTRAMUSCULAR | Status: DC | PRN
  Administered 2015-09-22: 10 mL
  Filled 2015-09-22: qty 10

## 2015-09-22 MED ORDER — SODIUM CHLORIDE 0.9 % IV SOLN
500.0000 mg/m2 | Freq: Once | INTRAVENOUS | Status: AC
  Administered 2015-09-22: 925 mg via INTRAVENOUS
  Filled 2015-09-22: qty 37

## 2015-09-22 MED ORDER — CYANOCOBALAMIN 1000 MCG/ML IJ SOLN
1000.0000 ug | Freq: Once | INTRAMUSCULAR | Status: AC
  Administered 2015-09-22: 1000 ug via INTRAMUSCULAR
  Filled 2015-09-22: qty 1

## 2015-09-22 NOTE — Progress Notes (Signed)
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  12/01/14 - Right Pleural Fluid Cytology. POSITIVE FOR MALIGNANT CELLS. ADENOCARCINOMA, CONSISTENT WITH LUNG ORIGIN.   Molecular testing for EGFR, ALK and others is negative.  Patient started palliative chemotherapy with Alimta/carboplatin on 12/14/14, got cycle 6 on 04/06/15, now on single agent maintenance Alimta.   HISTORY OF PRESENT ILLNESS:  Patient returns for further evaluation and consideration of her next infusion of pemetrexed.   Other than some increased anxiety, particularly at night interrupting her sleep she is tolerating her treatments well without significant side effects. Her appetite has improved and she no longer uses Marinol.  She admits to occasional headache, but has no other neurologic complaints. She denies any recent fevers. She continues to have dyspnea on exertion which is unchanged. She denies any chest pain. She denies any nausea, vomiting, constipation, or diarrhea. Patient offers no further specific complaints today.    REVIEW OF SYSTEMS:   Review of Systems  Constitutional: Negative for fever and malaise/fatigue.  Respiratory: Positive for shortness of breath.   Cardiovascular: Negative.   Gastrointestinal: Negative.   Neurological: Positive for headaches.  Psychiatric/Behavioral: The patient is nervous/anxious and has insomnia.     PAST MEDICAL HISTORY: Reviewed Past Medical History  Diagnosis Date  . Lung cancer ([place])   . Hypertension   . Hypercholesteremia   . Metastatic lung cancer (metastasis from lung to other site) ([place]) 01/21/2015  . Anemia 01/21/2015  .  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 GERD (gastroesophageal reflux disease)           Hypertension Osteoarthritis Breast lump removal in [place] about 10 years ago reportedly benign Partial hysterectomy 20 years ago, denies malignancy  Stage IV non-small cell right lung cancer with malignant effusion and left lung metastasis diagnosed in March 2016.   PAST SURGICAL HISTORY:Reviewed As above  FAMILY HISTORY:Reviewed Family History  Problem Relation Age of Onset  . Cervical cancer Mother   . Uterine cancer Maternal Grandmother     SOCIAL HISTORY: Reviewed Social History  Substance Use Topics  . Smoking status: Former Smoker -- 0.25 packs/day for 8 years    Types: Cigarettes    Quit date: 07/20/2014  . Smokeless tobacco: Not on file  . Alcohol Use: No    Allergies  Allergen Reactions  . Tegaderm Ag Mesh [Silver] Other (See Comments)    blisters    Current Outpatient Prescriptions  Medication Sig Dispense Refill  . ferrous sulfate 325 (65 FE) MG tablet Take 325 mg by mouth daily with breakfast.    . furosemide (LASIX) 20 MG tablet Take 1 tablet daily as needed for swelling. 30 tablet 2  . HYDROcodone-acetaminophen (NORCO) 5-325 MG per tablet Take 1 tablet by mouth every 6 (six) hours as needed for moderate pain. 90 tablet 0  . lidocaine-prilocaine (EMLA) cream Apply cream 1 hour before chemotherapy treatment 30 g 1  . lisinopril-hydrochlorothiazide (PRINZIDE,ZESTORETIC) 10-12.5 MG per tablet Take 1 tablet by mouth daily.    . Multiple Vitamins-Minerals (MULTIVITAMIN WITH MINERALS) tablet Take 1 tablet by mouth daily. 30 tablet 3  . omeprazole (PRILOSEC) 20 MG capsule Take 20 mg by mouth every  morning.     . promethazine (PHENERGAN) 25 MG tablet Take 25 mg by mouth every 6 (six) hours as needed for nausea or vomiting.    . simvastatin (ZOCOR) 20 MG tablet Take 20 mg by mouth at bedtime.     . ALPRAZolam (XANAX) 1 MG tablet Take 1 tablet  (1 mg total) by mouth at bedtime as needed for anxiety. Take 1/2 tablet 1-2x per day as needed for anxiety and take 1 tablet at night for anxiety/sleeep 45 tablet 0  . potassium chloride (K-DUR) 10 MEQ tablet Take 1 tablet (10 mEq total) by mouth daily. Take 1 tablet daily for 6 days and then only take 1 daily as needed when you take furosemide tablet (Patient not taking: Reported on 09/22/2015) 36 tablet 0   No current facility-administered medications for this visit.   Facility-Administered Medications Ordered in Other Visits  Medication Dose Route Frequency Provider Last Rate Last Dose  . heparin lock flush 100 unit/mL  250 Units Intracatheter PRN Loann Quill, NP   250 Units at 01/21/15 1734  . heparin lock flush 100 unit/mL  500 Units Intracatheter Once PRN Janese Banks, MD      . sodium chloride 0.9 % injection 10 mL  10 mL Intracatheter PRN Loann Quill, NP   10 mL at 01/21/15 1734  . sodium chloride 0.9 % injection 10 mL  10 mL Intravenous PRN Janese Banks, MD   10 mL at 04/27/15 0905  . sodium chloride 0.9 % injection 10 mL  10 mL Intracatheter PRN Janese Banks, MD      . sodium chloride 0.9 % injection 10 mL  10 mL Intracatheter PRN Jeralyn Ruths, MD   10 mL at 09/22/15 1001  . sodium chloride 0.9 % injection 3 mL  3 mL Intracatheter PRN Loann Quill, NP   3 mL at 01/21/15 1731    OBJECTIVE: Filed Vitals:   09/22/15 1022  BP: 102/66  Pulse: 83  Temp: 97.1 F (36.2 C)  Resp: 18     Body mass index is 28.4 kg/(m^2).    ECOG FS:1 - Symptomatic but completely ambulatory   General: Well-developed, well-nourished, no acute distress. Eyes: Pink conjunctiva, anicteric sclera. Lungs: Clear to auscultation bilaterally. Heart: Regular rate and rhythm. No rubs, murmurs, or gallops. Abdomen: Soft, nontender, nondistended. No organomegaly noted, normoactive bowel sounds. Musculoskeletal: No edema, cyanosis, or clubbing. Neuro: Alert, answering all questions  appropriately. Cranial nerves grossly intact. Skin: No rashes or petechiae noted. Psych: Normal affect.   LAB RESULTS: WBC 5, Hb 11, plt 292, ANC 2.4, Cr 0.68.    Component Value Date/Time   NA 135 09/22/2015 0953   NA 138 01/11/2015 0929   K 3.4* 09/22/2015 0953   K 3.4* 01/11/2015 0929   CL 102 09/22/2015 0953   CL 104 01/11/2015 0929   CO2 28 09/22/2015 0953   CO2 25 01/11/2015 0929   GLUCOSE 106* 09/22/2015 0953   GLUCOSE 142* 01/11/2015 0929   BUN 17 09/22/2015 0953   BUN 9 01/11/2015 0929   CREATININE 0.83 09/22/2015 0953   CREATININE 0.80 01/11/2015 0929   CALCIUM 9.2 09/22/2015 0953   CALCIUM 7.9* 01/11/2015 0929   PROT 7.2 09/22/2015 0953   PROT 5.3* 12/30/2014 0519   ALBUMIN 3.8 09/22/2015 0953   ALBUMIN 1.9* 12/30/2014 0519   AST 34 09/22/2015 0953   AST 24 12/30/2014 0519   ALT 52 09/22/2015 0953   ALT 46 12/30/2014 0519   ALKPHOS  71 09/22/2015 0953   ALKPHOS 87 12/30/2014 0519   BILITOT 0.6 09/22/2015 0953   BILITOT 1.9* 12/30/2014 0519   GFRNONAA >60 09/22/2015 0953   GFRNONAA >60 01/11/2015 0929   GFRAA >60 09/22/2015 0953   GFRAA >60 01/11/2015 0929       STUDIES: 12/01/14 - Right Pleural Fluid Cytology. POSITIVE FOR MALIGNANT CELLS. ADENOCARCINOMA, CONSISTENT WITH LUNG ORIGIN.  12/11/14 - CT head. IMPRESSION: 1. Multiple bilateral enhancing mass lesions compatible with metastatic disease to the brain. MRI would be more sensitive for small lesions if delineation of the specific number of lesions is important for the patient's treatment. 2. No evidence for hemorrhage or significant mass effect.  12/30/14 - CT Chest. IMPRESSION: 1. Decreased right hydropneumothorax with removal of right pleural catheter. Small residual subpulmonic fluid component persists with scattered foci of extrapleural air. Some improved aeration of the right lower lobe,, however diffuse lower lobe and perihilar right middle lobe consolidation persists. It is difficult to  differentiate malignancy from atelectasis or pneumonia in the setting, however patient has had recent PET which better characterized this. 2. Diffuse pleural thickening and nodularity in the right hemithorax. Diffuse pulmonary metastatic disease.  03/08/15 - CT Chest. IMPRESSION:  1. Overall mild partial interval response to therapy. Tumor involving the right lower lobe appears decreased from previous exam with associated improved aeration to the right lung. 2. Numerous pulmonary nodules are again noted within both lungs consistent with metastatic disease. These appear stable in the interval. 3. Resolution of previous right supraclavicular and right paratracheal adenopathy. 4. Aortic atherosclerosis.  04/20/15 - CT scan of Chest.  IMPRESSION:  1. Mild partial interval treatment response. Decreased size of spiculated central right lower lobe lung mass, likely the primary tumor. 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 Stable to mildly decreased pulmonary metastases.  2. Mildly increased small circumferential right pleural effusion.  3. Stable mild right hilar and right pericardiophrenic lymphadenopathy.  4. Stable indeterminate hypodense 1.2 cm left upper renal lesion.   ASSESSMENT / PLAN:    1. 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 On 12/01/14 - Right Pleural Fluid Cytology. POSITIVE FOR MALIGNANT CELLS. ADENOCARCINOMA, CONSISTENT WITH LUNG ORIGIN. Molecular testing for EGFR, ALK and others is negative.   CT results from August 24, 2015 reviewed independently with a mixed response to treatment. Proceed with cycle 8 single agent pemetrexed 500 mg/m2 IV today. Patient will also receive B12 injections with every treatment. Return to clinic in 3 weeks for consideration of cycle 9. Will repeat imaging in 3 months in approximately March 2017.  Patient understands she can return to clinic at any time if she has any questions, concerns, or complaints.  2.  Headaches: CT head on  December 09, 2014 reported multiple brain metastasis - now off Decadron given recent severe infection, got palliative brain radiation.  Will repeat MRI in the next 1-2 weeks given her headaches. 3. Pain - continue Norco prn. 4. Kidney lesion: Unclear if metastatic deposit or second primary of the left kidney. Continue to monitor closely.  5. Anxiety: Increase Xanax to 1 mg at night and 0.5 mg as needed during the day.     Lloyd Huger, MD   09/22/2015 1:34 PM

## 2015-09-22 NOTE — Progress Notes (Signed)
Patient has an increase in her anxiety that is worse at night.  She is not taking the Potassium but has added a daily orange juice and/or banana.  She is having some nasal congestion with clear mucus.

## 2015-10-12 ENCOUNTER — Ambulatory Visit
Admission: RE | Admit: 2015-10-12 | Discharge: 2015-10-12 | Disposition: A | Discharge status: Home / Self Care | Payer: BLUE CROSS/BLUE SHIELD | Source: Ambulatory Visit | Attending: Oncology | Admitting: Oncology

## 2015-10-12 DIAGNOSIS — H748X3 Other specified disorders of middle ear and mastoid, bilateral: Secondary | ICD-10-CM | POA: Insufficient documentation

## 2015-10-12 DIAGNOSIS — R51 Headache: Secondary | ICD-10-CM | POA: Diagnosis present

## 2015-10-12 DIAGNOSIS — C7931 Secondary malignant neoplasm of brain: Secondary | ICD-10-CM | POA: Insufficient documentation

## 2015-10-12 DIAGNOSIS — C719 Malignant neoplasm of brain, unspecified: Secondary | ICD-10-CM | POA: Diagnosis not present

## 2015-10-12 DIAGNOSIS — C3490 Malignant neoplasm of unspecified part of unspecified bronchus or lung: Secondary | ICD-10-CM | POA: Insufficient documentation

## 2015-10-12 MED ORDER — GADOBENATE DIMEGLUMINE 529 MG/ML IV SOLN
20.0000 mL | Freq: Once | INTRAVENOUS | Status: AC | PRN
  Administered 2015-10-12: 15 mL via INTRAVENOUS

## 2015-10-13 ENCOUNTER — Inpatient Hospital Stay: Payer: BLUE CROSS/BLUE SHIELD

## 2015-10-13 ENCOUNTER — Inpatient Hospital Stay (HOSPITAL_BASED_OUTPATIENT_CLINIC_OR_DEPARTMENT_OTHER): Payer: BLUE CROSS/BLUE SHIELD | Admitting: Oncology

## 2015-10-13 VITALS — BP 118/82 | HR 92 | Temp 97.2°F | Wt 172.6 lb

## 2015-10-13 DIAGNOSIS — I10 Essential (primary) hypertension: Secondary | ICD-10-CM

## 2015-10-13 DIAGNOSIS — C7802 Secondary malignant neoplasm of left lung: Secondary | ICD-10-CM

## 2015-10-13 DIAGNOSIS — C7931 Secondary malignant neoplasm of brain: Secondary | ICD-10-CM

## 2015-10-13 DIAGNOSIS — C3491 Malignant neoplasm of unspecified part of right bronchus or lung: Secondary | ICD-10-CM

## 2015-10-13 DIAGNOSIS — K219 Gastro-esophageal reflux disease without esophagitis: Secondary | ICD-10-CM

## 2015-10-13 DIAGNOSIS — Z79899 Other long term (current) drug therapy: Secondary | ICD-10-CM

## 2015-10-13 DIAGNOSIS — E7800 Pure hypercholesterolemia, unspecified: Secondary | ICD-10-CM

## 2015-10-13 DIAGNOSIS — J910 Malignant pleural effusion: Secondary | ICD-10-CM | POA: Diagnosis not present

## 2015-10-13 DIAGNOSIS — E876 Hypokalemia: Secondary | ICD-10-CM

## 2015-10-13 DIAGNOSIS — F419 Anxiety disorder, unspecified: Secondary | ICD-10-CM

## 2015-10-13 DIAGNOSIS — Z923 Personal history of irradiation: Secondary | ICD-10-CM

## 2015-10-13 DIAGNOSIS — C3490 Malignant neoplasm of unspecified part of unspecified bronchus or lung: Secondary | ICD-10-CM

## 2015-10-13 DIAGNOSIS — N289 Disorder of kidney and ureter, unspecified: Secondary | ICD-10-CM

## 2015-10-13 LAB — CBC WITH DIFFERENTIAL/PLATELET
Basophils Absolute: 0 10*3/uL (ref 0–0.1)
Basophils Relative: 1 %
EOS ABS: 0.2 10*3/uL (ref 0–0.7)
EOS PCT: 3 %
HCT: 34.5 % — ABNORMAL LOW (ref 35.0–47.0)
Hemoglobin: 11.6 g/dL — ABNORMAL LOW (ref 12.0–16.0)
LYMPHS ABS: 1.7 10*3/uL (ref 1.0–3.6)
LYMPHS PCT: 31 %
MCH: 28.6 pg (ref 26.0–34.0)
MCHC: 33.6 g/dL (ref 32.0–36.0)
MCV: 85.1 fL (ref 80.0–100.0)
MONOS PCT: 11 %
Monocytes Absolute: 0.6 10*3/uL (ref 0.2–0.9)
Neutro Abs: 3 10*3/uL (ref 1.4–6.5)
Neutrophils Relative %: 54 %
PLATELETS: 222 10*3/uL (ref 150–440)
RBC: 4.06 MIL/uL (ref 3.80–5.20)
RDW: 13.8 % (ref 11.5–14.5)
WBC: 5.6 10*3/uL (ref 3.6–11.0)

## 2015-10-13 LAB — COMPREHENSIVE METABOLIC PANEL
ALT: 37 U/L (ref 14–54)
ANION GAP: 4 — AB (ref 5–15)
AST: 29 U/L (ref 15–41)
Albumin: 3.7 g/dL (ref 3.5–5.0)
Alkaline Phosphatase: 66 U/L (ref 38–126)
BUN: 15 mg/dL (ref 6–20)
CHLORIDE: 105 mmol/L (ref 101–111)
CO2: 27 mmol/L (ref 22–32)
Calcium: 9.1 mg/dL (ref 8.9–10.3)
Creatinine, Ser: 0.69 mg/dL (ref 0.44–1.00)
Glucose, Bld: 128 mg/dL — ABNORMAL HIGH (ref 65–99)
POTASSIUM: 3.1 mmol/L — AB (ref 3.5–5.1)
Sodium: 136 mmol/L (ref 135–145)
TOTAL PROTEIN: 7.1 g/dL (ref 6.5–8.1)
Total Bilirubin: 0.7 mg/dL (ref 0.3–1.2)

## 2015-10-13 MED ORDER — SODIUM CHLORIDE 0.9 % IV SOLN
500.0000 mg/m2 | Freq: Once | INTRAVENOUS | Status: AC
  Administered 2015-10-13: 925 mg via INTRAVENOUS
  Filled 2015-10-13: qty 37

## 2015-10-13 MED ORDER — CYANOCOBALAMIN 1000 MCG/ML IJ SOLN
1000.0000 ug | Freq: Once | INTRAMUSCULAR | Status: AC
  Administered 2015-10-13: 1000 ug via INTRAMUSCULAR
  Filled 2015-10-13: qty 1

## 2015-10-13 MED ORDER — SODIUM CHLORIDE 0.9 % IJ SOLN
10.0000 mL | Freq: Once | INTRAMUSCULAR | Status: AC
Start: 2015-10-13 — End: 2015-10-13
  Administered 2015-10-13: 10 mL via INTRAVENOUS
  Filled 2015-10-13: qty 10

## 2015-10-13 MED ORDER — HEPARIN SOD (PORK) LOCK FLUSH 100 UNIT/ML IV SOLN
500.0000 [IU] | Freq: Once | INTRAVENOUS | Status: AC
  Administered 2015-10-13: 500 [IU] via INTRAVENOUS
  Filled 2015-10-13: qty 5

## 2015-10-13 MED ORDER — SODIUM CHLORIDE 0.9 % IV SOLN
Freq: Once | INTRAVENOUS | Status: AC
  Administered 2015-10-13: 11:00:00 via INTRAVENOUS
  Filled 2015-10-13: qty 1000

## 2015-10-13 MED ORDER — SODIUM CHLORIDE 0.9 % IV SOLN
Freq: Once | INTRAVENOUS | Status: AC
  Administered 2015-10-13: 11:00:00 via INTRAVENOUS
  Filled 2015-10-13: qty 4

## 2015-10-13 NOTE — Progress Notes (Signed)
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  12/01/14 - Right Pleural Fluid Cytology. POSITIVE FOR MALIGNANT CELLS. ADENOCARCINOMA, CONSISTENT WITH LUNG ORIGIN.   Molecular testing for EGFR, ALK and others is negative.  Patient started palliative chemotherapy with Alimta/carboplatin on 12/14/14, got cycle 6 on 04/06/15, now on single agent maintenance Alimta.   HISTORY OF PRESENT ILLNESS:  Patient returns for further evaluation and consideration of her next infusion of pemetrexed.  Patient is tolerating her treatments well without significant side effects. Her appetite has improved and she no longer uses Marinol.  She denies neurologic complaints. She denies any recent fevers.  She denies any chest pain or shortness of breath. She denies any nausea, vomiting, constipation, or diarrhea. She denies pain. Patient offers no specific complaints today.    REVIEW OF SYSTEMS:   Review of Systems  Constitutional: Negative.   HENT: Negative.   Respiratory: Negative.   Cardiovascular: Negative.   Gastrointestinal: Negative.   Musculoskeletal: Negative.   Neurological: Negative for headaches.  Psychiatric/Behavioral: Negative.     PAST MEDICAL HISTORY: Reviewed Past Medical History  Diagnosis Date  . Lung cancer ([facility])   . Hypertension   . Hypercholesteremia   . Metastatic lung cancer (metastasis from lung to other site) ([facility]) 01/21/2015  . Anemia 01/21/2015  . 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 GERD (gastroesophageal reflux disease)           Hypertension Osteoarthritis Breast lump removal in [place] about 10 years  ago reportedly benign Partial hysterectomy 20 years ago, denies malignancy  Stage IV non-small cell right lung cancer with malignant effusion and left lung metastasis diagnosed in March 2016.   PAST SURGICAL HISTORY:Reviewed As above  FAMILY HISTORY:Reviewed Family History  Problem Relation Age of Onset  . Cervical cancer Mother   . Uterine cancer Maternal Grandmother     SOCIAL HISTORY: Reviewed Social History  Substance Use Topics  . Smoking status: Former Smoker -- 0.25 packs/day for 8 years    Types: Cigarettes    Quit date: 07/20/2014  . Smokeless tobacco: Not on file  . Alcohol Use: No    Allergies  Allergen Reactions  . Tegaderm Ag Mesh [Silver] Other (See Comments)    blisters    Current Outpatient Prescriptions  Medication Sig Dispense Refill  . ALPRAZolam (XANAX) 1 MG tablet Take 1 tablet (1 mg total) by mouth at bedtime as needed for anxiety. Take 1/2 tablet 1-2x per day as needed for anxiety and take 1 tablet at night for anxiety/sleeep 45 tablet 0  . ferrous sulfate 325 (65 FE) MG tablet Take 325 mg by mouth daily with breakfast.    . furosemide (LASIX) 20 MG tablet Take 1 tablet daily as needed for swelling. 30 tablet 2  . HYDROcodone-acetaminophen (NORCO) 5-325 MG per tablet Take 1 tablet by mouth every 6 (six) hours as needed for moderate pain. 90 tablet 0  . lidocaine-prilocaine (EMLA) cream Apply cream 1 hour before chemotherapy treatment 30 g 1  . lisinopril-hydrochlorothiazide (PRINZIDE,ZESTORETIC) 10-12.5 MG per tablet Take 1 tablet by mouth daily.    . Multiple Vitamins-Minerals (MULTIVITAMIN WITH MINERALS) tablet Take 1 tablet by mouth daily. 30  tablet 3  . omeprazole (PRILOSEC) 20 MG capsule Take 20 mg by mouth every morning.     . potassium chloride (K-DUR) 10 MEQ tablet Take 1 tablet (10 mEq total) by mouth daily. Take 1 tablet daily for 6 days and then only take 1 daily as needed when you take furosemide tablet 36 tablet 0  .  promethazine (PHENERGAN) 25 MG tablet Take 25 mg by mouth every 6 (six) hours as needed for nausea or vomiting.    . simvastatin (ZOCOR) 20 MG tablet Take 20 mg by mouth at bedtime.      No current facility-administered medications for this visit.   Facility-Administered Medications Ordered in Other Visits  Medication Dose Route Frequency Provider Last Rate Last Dose  . heparin lock flush 100 unit/mL  250 Units Intracatheter PRN Loann Quill, NP   250 Units at 01/21/15 1734  . heparin lock flush 100 unit/mL  500 Units Intracatheter Once PRN Janese Banks, MD      . sodium chloride 0.9 % injection 10 mL  10 mL Intracatheter PRN Loann Quill, NP   10 mL at 01/21/15 1734  . sodium chloride 0.9 % injection 10 mL  10 mL Intravenous PRN Janese Banks, MD   10 mL at 04/27/15 0905  . sodium chloride 0.9 % injection 10 mL  10 mL Intracatheter PRN Janese Banks, MD      . sodium chloride 0.9 % injection 3 mL  3 mL Intracatheter PRN Loann Quill, NP   3 mL at 01/21/15 1731    OBJECTIVE: Filed Vitals:   10/13/15 0940  BP: 118/82  Pulse: 92  Temp: 97.2 F (36.2 C)     Body mass index is 28.73 kg/(m^2).    ECOG FS:1 - Symptomatic but completely ambulatory   General: Well-developed, well-nourished, no acute distress. Eyes: Pink conjunctiva, anicteric sclera. Lungs: Clear to auscultation bilaterally. Heart: Regular rate and rhythm. No rubs, murmurs, or gallops. Abdomen: Soft, nontender, nondistended. No organomegaly noted, normoactive bowel sounds. Musculoskeletal: No edema, cyanosis, or clubbing. Neuro: Alert, answering all questions appropriately. Cranial nerves grossly intact. Skin: No rashes or petechiae noted. Psych: Normal affect.   LAB RESULTS: WBC 5, Hb 11, plt 292, ANC 2.4, Cr 0.68.    Component Value Date/Time   NA 136 10/13/2015 0910   NA 138 01/11/2015 0929   K 3.1* 10/13/2015 0910   K 3.4* 01/11/2015 0929   CL 105 10/13/2015 0910   CL 104 01/11/2015 0929    CO2 27 10/13/2015 0910   CO2 25 01/11/2015 0929   GLUCOSE 128* 10/13/2015 0910   GLUCOSE 142* 01/11/2015 0929   BUN 15 10/13/2015 0910   BUN 9 01/11/2015 0929   CREATININE 0.69 10/13/2015 0910   CREATININE 0.80 01/11/2015 0929   CALCIUM 9.1 10/13/2015 0910   CALCIUM 7.9* 01/11/2015 0929   PROT 7.1 10/13/2015 0910   PROT 5.3* 12/30/2014 0519   ALBUMIN 3.7 10/13/2015 0910   ALBUMIN 1.9* 12/30/2014 0519   AST 29 10/13/2015 0910   AST 24 12/30/2014 0519   ALT 37 10/13/2015 0910   ALT 46 12/30/2014 0519   ALKPHOS 66 10/13/2015 0910   ALKPHOS 87 12/30/2014 0519   BILITOT 0.7 10/13/2015 0910   BILITOT 1.9* 12/30/2014 0519   GFRNONAA >60 10/13/2015 0910   GFRNONAA >60 01/11/2015 0929   GFRAA >60 10/13/2015 0910   GFRAA >60 01/11/2015 0929       STUDIES: 12/01/14 - Right Pleural Fluid Cytology. POSITIVE FOR MALIGNANT  CELLS. ADENOCARCINOMA, CONSISTENT WITH LUNG ORIGIN.  12/11/14 - CT head. IMPRESSION: 1. Multiple bilateral enhancing mass lesions compatible with metastatic disease to the brain. MRI would be more sensitive for small lesions if delineation of the specific number of lesions is important for the patient's treatment. 2. No evidence for hemorrhage or significant mass effect.  12/30/14 - CT Chest. IMPRESSION: 1. Decreased right hydropneumothorax with removal of right pleural catheter. Small residual subpulmonic fluid component persists with scattered foci of extrapleural air. Some improved aeration of the right lower lobe,, however diffuse lower lobe and perihilar right middle lobe consolidation persists. It is difficult to differentiate malignancy from atelectasis or pneumonia in the setting, however patient has had recent PET which better characterized this. 2. Diffuse pleural thickening and nodularity in the right hemithorax. Diffuse pulmonary metastatic disease.  03/08/15 - CT Chest. IMPRESSION:  1. Overall mild partial interval response to therapy. Tumor involving the right  lower lobe appears decreased from previous exam with associated improved aeration to the right lung. 2. Numerous pulmonary nodules are again noted within both lungs consistent with metastatic disease. These appear stable in the interval. 3. Resolution of previous right supraclavicular and right paratracheal adenopathy. 4. Aortic atherosclerosis.  04/20/15 - CT scan of Chest.  IMPRESSION:  1. Mild partial interval treatment response. Decreased size of spiculated central right lower lobe lung mass, likely the primary tumor. 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 Stable to mildly decreased pulmonary metastases.  2. Mildly increased small circumferential right pleural effusion.  3. Stable mild right hilar and right pericardiophrenic lymphadenopathy.  4. Stable indeterminate hypodense 1.2 cm left upper renal lesion.   ASSESSMENT / PLAN:    1. 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 On 12/01/14 - Right Pleural Fluid Cytology. POSITIVE FOR MALIGNANT CELLS. ADENOCARCINOMA, CONSISTENT WITH LUNG ORIGIN. Molecular testing for EGFR, ALK and others is negative.   CT results from August 24, 2015 reviewed independently with a mixed response to treatment. Proceed with cycle 9 single agent pemetrexed 500 mg/m2 IV today. Patient will also receive B12 injections with every treatment. Return to clinic in 3 weeks for consideration of cycle 10. Will repeat imaging in 3 months in approximately March 2017. Patient understands she can return to clinic at any time if she has any questions, concerns, or complaints.  2.  Headaches: CT head on  December 09, 2014 reported multiple brain metastasis - now off Decadron given recent severe infection, got palliative brain radiation.  Repeat MRI from January 24th shows improvement with 2 or the 4 lesions cleared and the other 2  decreased in size.  3. Pain - Improved. Continue Norco prn. 4. Kidney lesion: Unclear if metastatic deposit or second primary of the left kidney. Continue to monitor closely.  5. Anxiety: Improved. Continue Xanax 1 mg at night and 0.5 mg as needed during the day. 6. Hypokalemia: 3.1 today. Patient has potassium tablets but has not been taking them. She will restart them today.      Mayra Reel, NP   10/13/2015 11:37 AM   Patient was seen and evaluated independently and I agree with the assessment and plan as dictated above.

## 2015-10-19 ENCOUNTER — Other Ambulatory Visit: Payer: Self-pay

## 2015-10-19 MED ORDER — POTASSIUM CHLORIDE ER 10 MEQ PO TBCR: 10.0000 meq | EXTENDED_RELEASE_TABLET | Freq: Every day | ORAL | Status: DC

## 2015-10-19 MED ORDER — ALPRAZOLAM 1 MG PO TABS: 1.0000 mg | ORAL_TABLET | Freq: Every evening | ORAL | Status: DC | PRN

## 2015-10-19 MED ORDER — LIDOCAINE-PRILOCAINE 2.5-2.5 % EX CREA: TOPICAL_CREAM | CUTANEOUS | Status: AC

## 2015-11-03 ENCOUNTER — Inpatient Hospital Stay: Payer: BLUE CROSS/BLUE SHIELD | Attending: Oncology

## 2015-11-03 ENCOUNTER — Inpatient Hospital Stay: Payer: BLUE CROSS/BLUE SHIELD

## 2015-11-03 ENCOUNTER — Inpatient Hospital Stay (HOSPITAL_BASED_OUTPATIENT_CLINIC_OR_DEPARTMENT_OTHER): Payer: BLUE CROSS/BLUE SHIELD | Admitting: Oncology

## 2015-11-03 VITALS — BP 131/90 | HR 79 | Temp 96.3°F | Resp 18 | Wt 173.1 lb

## 2015-11-03 DIAGNOSIS — C3491 Malignant neoplasm of unspecified part of right bronchus or lung: Secondary | ICD-10-CM

## 2015-11-03 DIAGNOSIS — C7802 Secondary malignant neoplasm of left lung: Secondary | ICD-10-CM | POA: Insufficient documentation

## 2015-11-03 DIAGNOSIS — Z923 Personal history of irradiation: Secondary | ICD-10-CM | POA: Insufficient documentation

## 2015-11-03 DIAGNOSIS — K219 Gastro-esophageal reflux disease without esophagitis: Secondary | ICD-10-CM | POA: Insufficient documentation

## 2015-11-03 DIAGNOSIS — Z5111 Encounter for antineoplastic chemotherapy: Secondary | ICD-10-CM | POA: Insufficient documentation

## 2015-11-03 DIAGNOSIS — Z87891 Personal history of nicotine dependence: Secondary | ICD-10-CM | POA: Insufficient documentation

## 2015-11-03 DIAGNOSIS — E7800 Pure hypercholesterolemia, unspecified: Secondary | ICD-10-CM | POA: Diagnosis not present

## 2015-11-03 DIAGNOSIS — I10 Essential (primary) hypertension: Secondary | ICD-10-CM

## 2015-11-03 DIAGNOSIS — J910 Malignant pleural effusion: Secondary | ICD-10-CM | POA: Insufficient documentation

## 2015-11-03 DIAGNOSIS — Z79899 Other long term (current) drug therapy: Secondary | ICD-10-CM | POA: Diagnosis not present

## 2015-11-03 DIAGNOSIS — C7931 Secondary malignant neoplasm of brain: Secondary | ICD-10-CM | POA: Insufficient documentation

## 2015-11-03 DIAGNOSIS — R2231 Localized swelling, mass and lump, right upper limb: Secondary | ICD-10-CM

## 2015-11-03 DIAGNOSIS — C3490 Malignant neoplasm of unspecified part of unspecified bronchus or lung: Secondary | ICD-10-CM

## 2015-11-03 LAB — CBC WITH DIFFERENTIAL/PLATELET
Basophils Absolute: 0 10*3/uL (ref 0–0.1)
Basophils Relative: 1 %
EOS ABS: 0.2 10*3/uL (ref 0–0.7)
EOS PCT: 3 %
HCT: 36.2 % (ref 35.0–47.0)
Hemoglobin: 12 g/dL (ref 12.0–16.0)
LYMPHS ABS: 1.7 10*3/uL (ref 1.0–3.6)
Lymphocytes Relative: 28 %
MCH: 28.2 pg (ref 26.0–34.0)
MCHC: 33.3 g/dL (ref 32.0–36.0)
MCV: 84.8 fL (ref 80.0–100.0)
MONOS PCT: 12 %
Monocytes Absolute: 0.7 10*3/uL (ref 0.2–0.9)
Neutro Abs: 3.4 10*3/uL (ref 1.4–6.5)
Neutrophils Relative %: 56 %
PLATELETS: 238 10*3/uL (ref 150–440)
RBC: 4.27 MIL/uL (ref 3.80–5.20)
RDW: 14.1 % (ref 11.5–14.5)
WBC: 6 10*3/uL (ref 3.6–11.0)

## 2015-11-03 LAB — COMPREHENSIVE METABOLIC PANEL
ALK PHOS: 70 U/L (ref 38–126)
ALT: 32 U/L (ref 14–54)
ANION GAP: 5 (ref 5–15)
AST: 29 U/L (ref 15–41)
Albumin: 3.9 g/dL (ref 3.5–5.0)
BUN: 17 mg/dL (ref 6–20)
CALCIUM: 9.1 mg/dL (ref 8.9–10.3)
CHLORIDE: 103 mmol/L (ref 101–111)
CO2: 28 mmol/L (ref 22–32)
Creatinine, Ser: 0.76 mg/dL (ref 0.44–1.00)
GFR calc non Af Amer: >60 mL/min (ref >60)
Glucose, Bld: 102 mg/dL — ABNORMAL HIGH (ref 65–99)
Potassium: 3.2 mmol/L — ABNORMAL LOW (ref 3.5–5.1)
SODIUM: 136 mmol/L (ref 135–145)
Total Bilirubin: 0.6 mg/dL (ref 0.3–1.2)
Total Protein: 7.3 g/dL (ref 6.5–8.1)

## 2015-11-03 MED ORDER — SODIUM CHLORIDE 0.9 % IV SOLN
500.0000 mg/m2 | Freq: Once | INTRAVENOUS | Status: AC
  Administered 2015-11-03: 925 mg via INTRAVENOUS
  Filled 2015-11-03: qty 37

## 2015-11-03 MED ORDER — CYANOCOBALAMIN 1000 MCG/ML IJ SOLN
1000.0000 ug | Freq: Once | INTRAMUSCULAR | Status: AC
  Administered 2015-11-03: 1000 ug via INTRAMUSCULAR
  Filled 2015-11-03: qty 1

## 2015-11-03 MED ORDER — SODIUM CHLORIDE 0.9 % IJ SOLN
10.0000 mL | INTRAMUSCULAR | Status: DC | PRN
  Administered 2015-11-03: 10 mL
  Filled 2015-11-03: qty 10

## 2015-11-03 MED ORDER — HEPARIN SOD (PORK) LOCK FLUSH 100 UNIT/ML IV SOLN
500.0000 [IU] | Freq: Once | INTRAVENOUS | Status: AC | PRN
  Administered 2015-11-03: 500 [IU]
  Filled 2015-11-03: qty 5

## 2015-11-03 MED ORDER — SODIUM CHLORIDE 0.9 % IV SOLN
Freq: Once | INTRAVENOUS | Status: AC
  Administered 2015-11-03: 10:00:00 via INTRAVENOUS
  Filled 2015-11-03: qty 1000

## 2015-11-03 MED ORDER — SODIUM CHLORIDE 0.9 % IV SOLN
Freq: Once | INTRAVENOUS | Status: AC
  Administered 2015-11-03: 11:00:00 via INTRAVENOUS
  Filled 2015-11-03: qty 4

## 2015-11-03 NOTE — Progress Notes (Signed)
Patient has noticed a small knot in right axillary that is not painful.

## 2015-11-03 NOTE — Progress Notes (Signed)
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  12/01/14 - Right Pleural Fluid Cytology. POSITIVE FOR MALIGNANT CELLS. ADENOCARCINOMA, CONSISTENT WITH LUNG ORIGIN.   Molecular testing for EGFR, ALK and others is negative.  Patient started palliative chemotherapy with Alimta/carboplatin on 12/14/14, got cycle 6 on 04/06/15, now on single agent maintenance Alimta.   HISTORY OF PRESENT ILLNESS:  Patient returns for further evaluation and consideration of cycle 10 pemetrexed.  Patient is tolerating her treatments well without significant side effects. She has noticed a small nodule under her right arm. She has a good appetite and denies weight loss. She denies neurologic complaints. She denies any recent fevers.  She denies any chest pain or shortness of breath. She denies any nausea, vomiting, constipation, or diarrhea. She denies pain. Patient offers no other specific complaints today.    REVIEW OF SYSTEMS:   Review of Systems  Constitutional: Negative.   HENT: Negative.   Respiratory: Negative.   Cardiovascular: Negative.   Gastrointestinal: Negative.   Musculoskeletal: Negative.   Neurological: Negative for headaches.  Psychiatric/Behavioral: Negative.     PAST MEDICAL HISTORY: Reviewed Past Medical History  Diagnosis Date  . Lung cancer ([place])   . Hypertension   . Hypercholesteremia   . Metastatic lung cancer (metastasis from lung to other site) ([place]) 01/21/2015  . Anemia 01/21/2015  . 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 GERD (gastroesophageal reflux disease)           Hypertension Osteoarthritis Breast lump  removal in [place] about 10 years ago reportedly benign Partial hysterectomy 20 years ago, denies malignancy  Stage IV non-small cell right lung cancer with malignant effusion and left lung metastasis diagnosed in March 2016.   PAST SURGICAL HISTORY:Reviewed As above  FAMILY HISTORY:Reviewed Family History  Problem Relation Age of Onset  . Cervical cancer Mother   . Uterine cancer Maternal Grandmother     SOCIAL HISTORY: Reviewed Social History  Substance Use Topics  . Smoking status: Former Smoker -- 0.25 packs/day for 8 years    Types: Cigarettes    Quit date: 07/20/2014  . Smokeless tobacco: Not on file  . Alcohol Use: No    Allergies  Allergen Reactions  . Tegaderm Ag Mesh [Silver] Other (See Comments)    blisters    Current Outpatient Prescriptions  Medication Sig Dispense Refill  . ALPRAZolam (XANAX) 1 MG tablet Take 1 tablet (1 mg total) by mouth at bedtime as needed for anxiety. Take 1/2 tablet 1-2x per day as needed for anxiety and take 1 tablet at night for anxiety/sleeep 45 tablet 0  . ferrous sulfate 325 (65 FE) MG tablet Take 325 mg by mouth daily with breakfast.    . furosemide (LASIX) 20 MG tablet Take 1 tablet daily as needed for swelling. 30 tablet 2  . HYDROcodone-acetaminophen (NORCO) 5-325 MG per tablet Take 1 tablet by mouth every 6 (six) hours as needed for moderate pain. 90 tablet 0  . lidocaine-prilocaine (EMLA) cream Apply cream 1 hour before chemotherapy treatment 30 g 1  . lisinopril-hydrochlorothiazide (PRINZIDE,ZESTORETIC) 10-12.5 MG per tablet Take 1 tablet by mouth daily.    . Multiple Vitamins-Minerals (MULTIVITAMIN WITH MINERALS) tablet  Take 1 tablet by mouth daily. 30 tablet 3  . omeprazole (PRILOSEC) 20 MG capsule Take 20 mg by mouth every morning.     . potassium chloride (K-DUR) 10 MEQ tablet Take 1 tablet (10 mEq total) by mouth daily. Take 1 tablet daily for 6 days and then only take 1 daily as needed when you take  furosemide tablet 90 tablet 0  . promethazine (PHENERGAN) 25 MG tablet Take 25 mg by mouth every 6 (six) hours as needed for nausea or vomiting.    . simvastatin (ZOCOR) 20 MG tablet Take 20 mg by mouth at bedtime.      No current facility-administered medications for this visit.   Facility-Administered Medications Ordered in Other Visits  Medication Dose Route Frequency Provider Last Rate Last Dose  . heparin lock flush 100 unit/mL  250 Units Intracatheter PRN Evlyn Kanner, NP   250 Units at 01/21/15 1734  . heparin lock flush 100 unit/mL  500 Units Intracatheter Once PRN Leia Alf, MD      . sodium chloride 0.9 % injection 10 mL  10 mL Intracatheter PRN Evlyn Kanner, NP   10 mL at 01/21/15 1734  . sodium chloride 0.9 % injection 10 mL  10 mL Intravenous PRN Leia Alf, MD   10 mL at 04/27/15 0905  . sodium chloride 0.9 % injection 10 mL  10 mL Intracatheter PRN Leia Alf, MD      . sodium chloride 0.9 % injection 10 mL  10 mL Intracatheter PRN Lloyd Huger, MD   10 mL at 11/03/15 0915  . sodium chloride 0.9 % injection 3 mL  3 mL Intracatheter PRN Evlyn Kanner, NP   3 mL at 01/21/15 1731    OBJECTIVE: Filed Vitals:   11/03/15 1004  BP: 131/90  Pulse: 79  Temp: 96.3 F (35.7 C)  Resp: 18     Body mass index is 28.8 kg/(m^2).    ECOG FS:1 - Symptomatic but completely ambulatory   General: Well-developed, well-nourished, no acute distress. Eyes: Pink conjunctiva, anicteric sclera. Lungs: Clear to auscultation bilaterally. Heart: Regular rate and rhythm. No rubs, murmurs, or gallops. Abdomen: Soft, nontender, nondistended. No organomegaly noted, normoactive bowel sounds. Musculoskeletal: No edema, cyanosis, or clubbing. Neuro: Alert, answering all questions appropriately. Cranial nerves grossly intact. Skin: Minimally palpable nodule in right axilla.  Psych: Normal affect.   LAB RESULTS: WBC 5, Hb 11, plt 292, ANC 2.4, Cr 0.68.    Component  Value Date/Time   NA 136 11/03/2015 0926   NA 138 01/11/2015 0929   K 3.2* 11/03/2015 0926   K 3.4* 01/11/2015 0929   CL 103 11/03/2015 0926   CL 104 01/11/2015 0929   CO2 28 11/03/2015 0926   CO2 25 01/11/2015 0929   GLUCOSE 102* 11/03/2015 0926   GLUCOSE 142* 01/11/2015 0929   BUN 17 11/03/2015 0926   BUN 9 01/11/2015 0929   CREATININE 0.76 11/03/2015 0926   CREATININE 0.80 01/11/2015 0929   CALCIUM 9.1 11/03/2015 0926   CALCIUM 7.9* 01/11/2015 0929   PROT 7.3 11/03/2015 0926   PROT 5.3* 12/30/2014 0519   ALBUMIN 3.9 11/03/2015 0926   ALBUMIN 1.9* 12/30/2014 0519   AST 29 11/03/2015 0926   AST 24 12/30/2014 0519   ALT 32 11/03/2015 0926   ALT 46 12/30/2014 0519   ALKPHOS 70 11/03/2015 0926   ALKPHOS 87 12/30/2014 0519   BILITOT 0.6 11/03/2015 0926   BILITOT 1.9* 12/30/2014 0519   GFRNONAA >  60 11/03/2015 0926   GFRNONAA >60 01/11/2015 0929   GFRAA >60 11/03/2015 0926   GFRAA >60 01/11/2015 0929       STUDIES: 12/01/14 - Right Pleural Fluid Cytology. POSITIVE FOR MALIGNANT CELLS. ADENOCARCINOMA, CONSISTENT WITH LUNG ORIGIN.  12/11/14 - CT head. IMPRESSION: 1. Multiple bilateral enhancing mass lesions compatible with metastatic disease to the brain. MRI would be more sensitive for small lesions if delineation of the specific number of lesions is important for the patient's treatment. 2. No evidence for hemorrhage or significant mass effect.  12/30/14 - CT Chest. IMPRESSION: 1. Decreased right hydropneumothorax with removal of right pleural catheter. Small residual subpulmonic fluid component persists with scattered foci of extrapleural air. Some improved aeration of the right lower lobe,, however diffuse lower lobe and perihilar right middle lobe consolidation persists. It is difficult to differentiate malignancy from atelectasis or pneumonia in the setting, however patient has had recent PET which better characterized this. 2. Diffuse pleural thickening and nodularity in the  right hemithorax. Diffuse pulmonary metastatic disease.  03/08/15 - CT Chest. IMPRESSION:  1. Overall mild partial interval response to therapy. Tumor involving the right lower lobe appears decreased from previous exam with associated improved aeration to the right lung. 2. Numerous pulmonary nodules are again noted within both lungs consistent with metastatic disease. These appear stable in the interval. 3. Resolution of previous right supraclavicular and right paratracheal adenopathy. 4. Aortic atherosclerosis.  04/20/15 - CT scan of Chest.  IMPRESSION:  1. Mild partial interval treatment response. Decreased size of spiculated central right lower lobe lung mass, likely the primary tumor. 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 Stable to mildly decreased pulmonary metastases.  2. Mildly increased small circumferential right pleural effusion.  3. Stable mild right hilar and right pericardiophrenic lymphadenopathy.  4. Stable indeterminate hypodense 1.2 cm left upper renal lesion.   ASSESSMENT / PLAN:    1. 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 On 12/01/14 - Right Pleural Fluid Cytology. POSITIVE FOR MALIGNANT CELLS. ADENOCARCINOMA, CONSISTENT WITH LUNG ORIGIN. Molecular testing for EGFR, ALK and others is negative.   CT results from August 24, 2015 reviewed independently with a mixed response to treatment. Proceed with cycle 10 single agent pemetrexed 500 mg/m2 IV today. Patient will also receive B12 injections with every treatment. Return to clinic in 3 weeks for consideration of cycle 11. Will repeat imaging in 3 months in approximately March 2017. Patient understands she can return to clinic at any time if she has any questions, concerns, or complaints.  2.  Headaches: Resolved. CT head on  December 09, 2014 reported multiple brain  metastasis - now off Decadron given recent severe infection, got palliative brain radiation.  Repeat MRI from January 24th shows improvement with 2 or the 4 lesions cleared and the other 2 decreased in size.  3. Pain - Improved. Continue Norco prn. 4. Kidney lesion: Unclear if metastatic deposit or second primary of the left kidney. Continue to monitor closely.  5. Anxiety: Improved. Continue Xanax 1 mg at night and 0.5 mg as needed during the day. 6. Hypokalemia: 3.1 today. Patient has potassium tablets but has not been taking them. Reminded her to take one tablet every day.     Mayra Reel, NP   11/03/2015 11:55 AM   Patient was seen and evaluated independently and I agree with the assessment and plan  as dictated above.  Lloyd Huger, MD 11/07/2015 7:39 AM

## 2015-11-24 ENCOUNTER — Inpatient Hospital Stay (HOSPITAL_BASED_OUTPATIENT_CLINIC_OR_DEPARTMENT_OTHER): Payer: BLUE CROSS/BLUE SHIELD | Admitting: Oncology

## 2015-11-24 ENCOUNTER — Inpatient Hospital Stay: Payer: BLUE CROSS/BLUE SHIELD | Attending: Oncology

## 2015-11-24 ENCOUNTER — Inpatient Hospital Stay: Payer: BLUE CROSS/BLUE SHIELD

## 2015-11-24 VITALS — BP 125/88 | HR 85 | Temp 96.3°F | Wt 170.2 lb

## 2015-11-24 DIAGNOSIS — C3491 Malignant neoplasm of unspecified part of right bronchus or lung: Secondary | ICD-10-CM | POA: Diagnosis not present

## 2015-11-24 DIAGNOSIS — R5381 Other malaise: Secondary | ICD-10-CM | POA: Diagnosis not present

## 2015-11-24 DIAGNOSIS — J910 Malignant pleural effusion: Secondary | ICD-10-CM

## 2015-11-24 DIAGNOSIS — R5383 Other fatigue: Secondary | ICD-10-CM | POA: Diagnosis not present

## 2015-11-24 DIAGNOSIS — R600 Localized edema: Secondary | ICD-10-CM | POA: Diagnosis not present

## 2015-11-24 DIAGNOSIS — D649 Anemia, unspecified: Secondary | ICD-10-CM

## 2015-11-24 DIAGNOSIS — N289 Disorder of kidney and ureter, unspecified: Secondary | ICD-10-CM | POA: Diagnosis not present

## 2015-11-24 DIAGNOSIS — C7931 Secondary malignant neoplasm of brain: Secondary | ICD-10-CM | POA: Insufficient documentation

## 2015-11-24 DIAGNOSIS — I10 Essential (primary) hypertension: Secondary | ICD-10-CM | POA: Insufficient documentation

## 2015-11-24 DIAGNOSIS — K219 Gastro-esophageal reflux disease without esophagitis: Secondary | ICD-10-CM | POA: Diagnosis not present

## 2015-11-24 DIAGNOSIS — Z87891 Personal history of nicotine dependence: Secondary | ICD-10-CM

## 2015-11-24 DIAGNOSIS — C7802 Secondary malignant neoplasm of left lung: Secondary | ICD-10-CM

## 2015-11-24 DIAGNOSIS — Z5111 Encounter for antineoplastic chemotherapy: Secondary | ICD-10-CM

## 2015-11-24 DIAGNOSIS — C3490 Malignant neoplasm of unspecified part of unspecified bronchus or lung: Secondary | ICD-10-CM

## 2015-11-24 DIAGNOSIS — Z79899 Other long term (current) drug therapy: Secondary | ICD-10-CM | POA: Insufficient documentation

## 2015-11-24 DIAGNOSIS — E876 Hypokalemia: Secondary | ICD-10-CM

## 2015-11-24 DIAGNOSIS — E7800 Pure hypercholesterolemia, unspecified: Secondary | ICD-10-CM | POA: Diagnosis not present

## 2015-11-24 LAB — COMPREHENSIVE METABOLIC PANEL
ALBUMIN: 3.6 g/dL (ref 3.5–5.0)
ALT: 29 U/L (ref 14–54)
AST: 28 U/L (ref 15–41)
Alkaline Phosphatase: 68 U/L (ref 38–126)
Anion gap: 4 — ABNORMAL LOW (ref 5–15)
BUN: 19 mg/dL (ref 6–20)
CHLORIDE: 104 mmol/L (ref 101–111)
CO2: 27 mmol/L (ref 22–32)
CREATININE: 0.65 mg/dL (ref 0.44–1.00)
Calcium: 8.7 mg/dL — ABNORMAL LOW (ref 8.9–10.3)
GFR calc Af Amer: >60 mL/min (ref >60)
GLUCOSE: 111 mg/dL — AB (ref 65–99)
POTASSIUM: 3.3 mmol/L — AB (ref 3.5–5.1)
Sodium: 135 mmol/L (ref 135–145)
Total Bilirubin: 0.8 mg/dL (ref 0.3–1.2)
Total Protein: 6.9 g/dL (ref 6.5–8.1)

## 2015-11-24 LAB — CBC WITH DIFFERENTIAL/PLATELET
BASOS ABS: 0.1 10*3/uL (ref 0–0.1)
BASOS PCT: 1 %
EOS PCT: 3 %
Eosinophils Absolute: 0.2 10*3/uL (ref 0–0.7)
HEMATOCRIT: 35 % (ref 35.0–47.0)
Hemoglobin: 11.6 g/dL — ABNORMAL LOW (ref 12.0–16.0)
LYMPHS PCT: 21 %
Lymphs Abs: 1.5 10*3/uL (ref 1.0–3.6)
MCH: 28.3 pg (ref 26.0–34.0)
MCHC: 33.3 g/dL (ref 32.0–36.0)
MCV: 84.8 fL (ref 80.0–100.0)
MONO ABS: 0.7 10*3/uL (ref 0.2–0.9)
Monocytes Relative: 10 %
NEUTROS ABS: 4.5 10*3/uL (ref 1.4–6.5)
Neutrophils Relative %: 65 %
PLATELETS: 213 10*3/uL (ref 150–440)
RBC: 4.12 MIL/uL (ref 3.80–5.20)
RDW: 14.3 % (ref 11.5–14.5)
WBC: 6.9 10*3/uL (ref 3.6–11.0)

## 2015-11-24 MED ORDER — SODIUM CHLORIDE 0.9 % IV SOLN
Freq: Once | INTRAVENOUS | Status: AC
  Administered 2015-11-24: 11:00:00 via INTRAVENOUS
  Filled 2015-11-24: qty 1000

## 2015-11-24 MED ORDER — SODIUM CHLORIDE 0.9 % IV SOLN
Freq: Once | INTRAVENOUS | Status: AC
  Administered 2015-11-24: 11:00:00 via INTRAVENOUS
  Filled 2015-11-24: qty 4

## 2015-11-24 MED ORDER — CYANOCOBALAMIN 1000 MCG/ML IJ SOLN: 1000.0000 ug | Freq: Once | INTRAMUSCULAR | Status: DC

## 2015-11-24 MED ORDER — HEPARIN SOD (PORK) LOCK FLUSH 100 UNIT/ML IV SOLN
500.0000 [IU] | Freq: Once | INTRAVENOUS | Status: DC
  Filled 2015-11-24: qty 5

## 2015-11-24 MED ORDER — HEPARIN SOD (PORK) LOCK FLUSH 100 UNIT/ML IV SOLN
500.0000 [IU] | Freq: Once | INTRAVENOUS | Status: AC | PRN
  Administered 2015-11-24: 500 [IU]

## 2015-11-24 MED ORDER — SODIUM CHLORIDE 0.9% FLUSH
10.0000 mL | INTRAVENOUS | Status: DC | PRN
  Administered 2015-11-24: 10 mL via INTRAVENOUS
  Filled 2015-11-24: qty 10

## 2015-11-24 MED ORDER — CYANOCOBALAMIN 1000 MCG/ML IJ SOLN
1000.0000 ug | Freq: Once | INTRAMUSCULAR | Status: AC
  Administered 2015-11-24: 1000 ug via INTRAMUSCULAR
  Filled 2015-11-24: qty 1

## 2015-11-24 MED ORDER — PEMETREXED DISODIUM CHEMO INJECTION 500 MG
500.0000 mg/m2 | Freq: Once | INTRAVENOUS | Status: AC
  Administered 2015-11-24: 925 mg via INTRAVENOUS
  Filled 2015-11-24: qty 37

## 2015-11-24 NOTE — Progress Notes (Signed)
Patricia Kelley  Telephone:(336) [phone]  Fax:(336) [place] DOB: Oct 25, 1955  MR#: [id number]  AQT#:[id number]  Patient Care Team: Marguerita Merles, MD as PCP - General (Family Medicine)  CHIEF COMPLAINT:  Chief Complaint  Patient presents with  . Lung Cancer    INTERVAL HISTORY: Patient returns for further evaluation and consideration of cycle 11 pemetrexed. Patient is tolerating her treatments well without significant side effects. Other than a little fatigue she feels good. She has a good appetite and denies weight loss. She denies neurologic complaints. She denies any recent fevers. She denies any chest pain or shortness of breath. She denies any nausea, vomiting, constipation, or diarrhea. She denies pain. Patient offers no other specific complaints today.   REVIEW OF SYSTEMS:   Review of Systems  Constitutional: Positive for malaise/fatigue.  Respiratory: Negative.   Genitourinary: Negative.   Musculoskeletal: Negative.   Skin: Negative.   Neurological: Negative.   Psychiatric/Behavioral: Negative.     As per HPI. Otherwise, a complete review of systems is negatve.  ONCOLOGY HISTORY:  No history exists.    PAST MEDICAL HISTORY: Past Medical History  Diagnosis Date  . Lung cancer ([place])   . Hypertension   . Hypercholesteremia   . Metastatic lung cancer (metastasis from lung to other site) ([place]) 01/21/2015  . Anemia 01/21/2015  . 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 GERD (gastroesophageal reflux disease)     PAST SURGICAL HISTORY: Past Surgical History  Procedure Laterality Date  . Insertion / placement pleural catheter    . Portacath placement Right 04/26/2015    Procedure: INSERTION PORT-A-CATH;  Surgeon: Nestor Lewandowsky, MD;  Location: [facility] ORS;  Service: General;  Laterality: Right;    FAMILY HISTORY Family History  Problem Relation Age of Onset  . Cervical cancer Mother   . Uterine cancer Maternal Grandmother      GYNECOLOGIC HISTORY:  No LMP recorded. Patient has had a hysterectomy.     ADVANCED DIRECTIVES:    HEALTH MAINTENANCE: Social History  Substance Use Topics  . Smoking status: Former Smoker -- 0.25 packs/day for 8 years    Types: Cigarettes    Quit date: 07/20/2014  . Smokeless tobacco: Not on file  . Alcohol Use: No    Allergies  Allergen Reactions  . Tegaderm Ag Mesh [Silver] Other (See Comments)    blisters    Current Outpatient Prescriptions  Medication Sig Dispense Refill  . ALPRAZolam (XANAX) 1 MG tablet Take 1 tablet (1 mg total) by mouth at bedtime as needed for anxiety. Take 1/2 tablet 1-2x per day as needed for anxiety and take 1 tablet at night for anxiety/sleeep 45 tablet 0  . ferrous sulfate 325 (65 FE) MG tablet Take 325 mg by mouth daily with breakfast.    . furosemide (LASIX) 20 MG tablet Take 1 tablet daily as needed for swelling. 30 tablet 2  . HYDROcodone-acetaminophen (NORCO) 5-325 MG per tablet Take 1 tablet by mouth every 6 (six) hours as needed for moderate pain. 90 tablet 0  . lidocaine-prilocaine (EMLA) cream Apply cream 1 hour before chemotherapy treatment 30 g 1  . lisinopril-hydrochlorothiazide (PRINZIDE,ZESTORETIC) 10-12.5 MG per tablet Take 1 tablet by mouth daily.    . Multiple Vitamins-Minerals (MULTIVITAMIN WITH MINERALS) tablet Take 1 tablet by mouth daily. 30 tablet 3  . omeprazole (PRILOSEC) 20 MG capsule Take 20 mg by mouth every morning.     . potassium chloride (K-DUR) 10 MEQ tablet Take 1  tablet (10 mEq total) by mouth daily. Take 1 tablet daily for 6 days and then only take 1 daily as needed when you take furosemide tablet 90 tablet 0  . promethazine (PHENERGAN) 25 MG tablet Take 25 mg by mouth every 6 (six) hours as needed for nausea or vomiting.    . simvastatin (ZOCOR) 20 MG tablet Take 20 mg by mouth at bedtime.      No current facility-administered medications for this visit.   Facility-Administered Medications Ordered in Other  Visits  Medication Dose Route Frequency Provider Last Rate Last Dose  . heparin lock flush 100 unit/mL  250 Units Intracatheter PRN Evlyn Kanner, NP   250 Units at 01/21/15 1734  . heparin lock flush 100 unit/mL  500 Units Intracatheter Once PRN Leia Alf, MD      . heparin lock flush 100 unit/mL  500 Units Intravenous Once Evlyn Kanner, NP      . heparin lock flush 100 unit/mL  500 Units Intracatheter Once PRN Lloyd Huger, MD      . PEMEtrexed (ALIMTA) 925 mg in sodium chloride 0.9 % 100 mL chemo infusion  500 mg/m2 (Treatment Plan Actual) Intravenous Once Lloyd Huger, MD   925 mg at 11/24/15 1156  . sodium chloride 0.9 % injection 10 mL  10 mL Intracatheter PRN Evlyn Kanner, NP   10 mL at 01/21/15 1734  . sodium chloride 0.9 % injection 10 mL  10 mL Intravenous PRN Leia Alf, MD   10 mL at 04/27/15 0905  . sodium chloride 0.9 % injection 10 mL  10 mL Intracatheter PRN Leia Alf, MD      . sodium chloride 0.9 % injection 3 mL  3 mL Intracatheter PRN Evlyn Kanner, NP   3 mL at 01/21/15 1731  . sodium chloride flush (NS) 0.9 % injection 10 mL  10 mL Intravenous PRN Evlyn Kanner, NP   10 mL at 11/24/15 0942    OBJECTIVE: BP 125/88 mmHg  Pulse 85  Temp(Src) 96.3 F (35.7 C)  Wt 170 lb 3.1 oz (77.2 kg)   Body mass index is 28.32 kg/(m^2).    ECOG FS:0 - Asymptomatic  General: Well-developed, well-nourished, no acute distress. Eyes: Pink conjunctiva, anicteric sclera. HEENT: Normocephalic, moist mucous membranes, clear oropharnyx. Lungs: Clear to auscultation bilaterally. Heart: Regular rate and rhythm. No rubs, murmurs, or gallops. Musculoskeletal: No edema, cyanosis, or clubbing. Neuro: Alert, answering all questions appropriately.  Skin: No rashes or petechiae noted. Psych: Normal affect. Lymphatics: No cervical, calvicular, axillary or LAD.   LAB RESULTS:  Infusion on 11/24/2015  Component Date Value Ref Range Status  . WBC  11/24/2015 6.9  3.6 - 11.0 K/uL Final  . RBC 11/24/2015 4.12  3.80 - 5.20 MIL/uL Final  . Hemoglobin 11/24/2015 11.6* 12.0 - 16.0 g/dL Final  . HCT 11/24/2015 35.0  35.0 - 47.0 % Final  . MCV 11/24/2015 84.8  80.0 - 100.0 fL Final  . MCH 11/24/2015 28.3  26.0 - 34.0 pg Final  . MCHC 11/24/2015 33.3  32.0 - 36.0 g/dL Final  . RDW 11/24/2015 14.3  11.5 - 14.5 % Final  . Platelets 11/24/2015 213  150 - 440 K/uL Final  . Neutrophils Relative % 11/24/2015 65   Final  . Neutro Abs 11/24/2015 4.5  1.4 - 6.5 K/uL Final  . Lymphocytes Relative 11/24/2015 21   Final  . Lymphs Abs 11/24/2015 1.5  1.0 - 3.6 K/uL Final  .  Monocytes Relative 11/24/2015 10   Final  . Monocytes Absolute 11/24/2015 0.7  0.2 - 0.9 K/uL Final  . Eosinophils Relative 11/24/2015 3   Final  . Eosinophils Absolute 11/24/2015 0.2  0 - 0.7 K/uL Final  . Basophils Relative 11/24/2015 1   Final  . Basophils Absolute 11/24/2015 0.1  0 - 0.1 K/uL Final  . Sodium 11/24/2015 135  135 - 145 mmol/L Final  . Potassium 11/24/2015 3.3* 3.5 - 5.1 mmol/L Final  . Chloride 11/24/2015 104  101 - 111 mmol/L Final  . CO2 11/24/2015 27  22 - 32 mmol/L Final  . Glucose, Bld 11/24/2015 111* 65 - 99 mg/dL Final  . BUN 11/24/2015 19  6 - 20 mg/dL Final  . Creatinine, Ser 11/24/2015 0.65  0.44 - 1.00 mg/dL Final  . Calcium 11/24/2015 8.7* 8.9 - 10.3 mg/dL Final  . Total Protein 11/24/2015 6.9  6.5 - 8.1 g/dL Final  . Albumin 11/24/2015 3.6  3.5 - 5.0 g/dL Final  . AST 11/24/2015 28  15 - 41 U/L Final  . ALT 11/24/2015 29  14 - 54 U/L Final  . Alkaline Phosphatase 11/24/2015 68  38 - 126 U/L Final  . Total Bilirubin 11/24/2015 0.8  0.3 - 1.2 mg/dL Final  . GFR calc non Af Amer 11/24/2015 >60  >60 mL/min Final  . GFR calc Af Amer 11/24/2015 >60  >60 mL/min Final   Comment: (NOTE) The eGFR has been calculated using the CKD EPI equation. This calculation has not been validated in all clinical situations. eGFR's persistently <60 mL/min signify  possible Chronic Kidney Disease.   . Anion gap 11/24/2015 4* 5 - 15 Final    STUDIES: 12/01/14 - Right Pleural Fluid Cytology. POSITIVE FOR MALIGNANT CELLS. ADENOCARCINOMA, CONSISTENT WITH LUNG ORIGIN.  12/11/14 - CT head. IMPRESSION: 1. Multiple bilateral enhancing mass lesions compatible with metastatic disease to the brain. MRI would be more sensitive for small lesions if delineation of the specific number of lesions is important for the patient's treatment. 2. No evidence for hemorrhage or significant mass effect.  12/30/14 - CT Chest. IMPRESSION: 1. Decreased right hydropneumothorax with removal of right pleural catheter. Small residual subpulmonic fluid component persists with scattered foci of extrapleural air. Some improved aeration of the right lower lobe,, however diffuse lower lobe and perihilar right middle lobe consolidation persists. It is difficult to differentiate malignancy from atelectasis or pneumonia in the setting, however patient has had recent PET which better characterized this. 2. Diffuse pleural thickening and nodularity in the right hemithorax. Diffuse pulmonary metastatic disease.  03/08/15 - CT Chest. IMPRESSION: 1. Overall mild partial interval response to therapy. Tumor involving the right lower lobe appears decreased from previous exam with associated improved aeration to the right lung. 2. Numerous pulmonary nodules are again noted within both lungs consistent with metastatic disease. These appear stable in the interval. 3. Resolution of previous right supraclavicular and right paratracheal adenopathy. 4. Aortic atherosclerosis.  04/20/15 - CT scan of Chest. IMPRESSION: 1. Mild partial interval treatment response. Decreased size of spiculated central right lower lobe lung mass, likely the primary tumor. 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 Stable to mildly decreased pulmonary metastases. 2. Mildly increased  small circumferential right pleural effusion. 3. Stable mild right hilar and right pericardiophrenic lymphadenopathy. 4. Stable indeterminate hypodense 1.2 cm left upper renal lesion.  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 On 12/01/14 - Right Pleural Fluid Cytology. POSITIVE FOR MALIGNANT CELLS. ADENOCARCINOMA, CONSISTENT WITH LUNG ORIGIN. Molecular testing for EGFR, ALK and others is negative  PLAN:   1. Lung Cancer: CT results from August 24, 2015 showed a mixed response to treatment. Proceed with cycle 11 single agent pemetrexed 500 mg/m2 IV today. Patient will also receive B12 injections with every treatment. Return to clinic in 3 weeks for consideration of cycle 12. Will repeat imaging after cycle 12.  2. Headaches: Resolved. CT head onMarch 23, 2016 reported multiple brain metastasis - now off Decadron given recent severe infection, got palliative brain radiation. Repeat MRI from January 24th shows improvement with 2 or the 4 lesions cleared and the other two decreased in size.  3. Pain - Resolved. Continue Norco prn. 4. Kidney lesion: Unclear if metastatic deposit or second primary of the left kidney. Continue to monitor closely.  5. Anxiety: Resolved. Continue Xanax 1 mg at night and 0.5 mg as needed during the day. 6. Hypokalemia: 3.3 today. New instructions to take two tablets daily.  7. Anemia: Mild. Monitor 8. Hypocalcemia: Corrects to 9.02. Monitor.   Patient expressed understanding and was in agreement with this plan. She also understands that She can call clinic at any time with any questions, concerns, or complaints.   Dr. Grayland Ormond was available for consultation and review of plan of care for this patient.  Metastatic lung cancer (metastasis from lung to other site) [facility])   Staging form: Lung, AJCC 7th Edition     Clinical: Stage Unknown ([place], NX,  M1) - Signed by Evlyn Kanner, NP on 01/21/2015   Mayra Reel, NP   11/24/2015 11:58 AM   Patient was seen and evaluated independently and I agree with the assessment and plan as dictated above.  Lloyd Huger, MD 12/03/2015 3:00 PM

## 2015-11-24 NOTE — Progress Notes (Signed)
Patient is feeling fatigued.

## 2015-12-09 ENCOUNTER — Other Ambulatory Visit: Payer: Self-pay | Admitting: Oncology

## 2015-12-15 ENCOUNTER — Inpatient Hospital Stay: Payer: BLUE CROSS/BLUE SHIELD

## 2015-12-15 ENCOUNTER — Inpatient Hospital Stay (HOSPITAL_BASED_OUTPATIENT_CLINIC_OR_DEPARTMENT_OTHER): Payer: BLUE CROSS/BLUE SHIELD | Admitting: Family Medicine

## 2015-12-15 VITALS — BP 128/85 | HR 71 | Temp 95.8°F | Resp 18 | Wt 178.4 lb

## 2015-12-15 DIAGNOSIS — R5381 Other malaise: Secondary | ICD-10-CM

## 2015-12-15 DIAGNOSIS — C3491 Malignant neoplasm of unspecified part of right bronchus or lung: Secondary | ICD-10-CM

## 2015-12-15 DIAGNOSIS — Z79899 Other long term (current) drug therapy: Secondary | ICD-10-CM

## 2015-12-15 DIAGNOSIS — J910 Malignant pleural effusion: Secondary | ICD-10-CM

## 2015-12-15 DIAGNOSIS — I10 Essential (primary) hypertension: Secondary | ICD-10-CM

## 2015-12-15 DIAGNOSIS — Z87891 Personal history of nicotine dependence: Secondary | ICD-10-CM | POA: Diagnosis not present

## 2015-12-15 DIAGNOSIS — R600 Localized edema: Secondary | ICD-10-CM

## 2015-12-15 DIAGNOSIS — C3490 Malignant neoplasm of unspecified part of unspecified bronchus or lung: Secondary | ICD-10-CM

## 2015-12-15 DIAGNOSIS — R5383 Other fatigue: Secondary | ICD-10-CM

## 2015-12-15 DIAGNOSIS — C7802 Secondary malignant neoplasm of left lung: Secondary | ICD-10-CM | POA: Diagnosis not present

## 2015-12-15 DIAGNOSIS — D649 Anemia, unspecified: Secondary | ICD-10-CM

## 2015-12-15 DIAGNOSIS — E876 Hypokalemia: Secondary | ICD-10-CM

## 2015-12-15 LAB — CBC WITH DIFFERENTIAL/PLATELET
Basophils Absolute: 0.1 10*3/uL (ref 0–0.1)
Basophils Relative: 1 %
EOS ABS: 0.2 10*3/uL (ref 0–0.7)
EOS PCT: 4 %
HCT: 32.6 % — ABNORMAL LOW (ref 35.0–47.0)
HEMOGLOBIN: 11 g/dL — AB (ref 12.0–16.0)
LYMPHS ABS: 1.3 10*3/uL (ref 1.0–3.6)
LYMPHS PCT: 23 %
MCH: 28.3 pg (ref 26.0–34.0)
MCHC: 33.7 g/dL (ref 32.0–36.0)
MCV: 83.8 fL (ref 80.0–100.0)
MONOS PCT: 16 %
Monocytes Absolute: 0.9 10*3/uL (ref 0.2–0.9)
Neutro Abs: 3.2 10*3/uL (ref 1.4–6.5)
Neutrophils Relative %: 56 %
Platelets: 222 10*3/uL (ref 150–440)
RBC: 3.89 MIL/uL (ref 3.80–5.20)
RDW: 14.5 % (ref 11.5–14.5)
WBC: 5.8 10*3/uL (ref 3.6–11.0)

## 2015-12-15 LAB — COMPREHENSIVE METABOLIC PANEL
ALK PHOS: 67 U/L (ref 38–126)
ALT: 29 U/L (ref 14–54)
ANION GAP: 4 — AB (ref 5–15)
AST: 28 U/L (ref 15–41)
Albumin: 3.4 g/dL — ABNORMAL LOW (ref 3.5–5.0)
BILIRUBIN TOTAL: 0.8 mg/dL (ref 0.3–1.2)
BUN: 19 mg/dL (ref 6–20)
CALCIUM: 8.8 mg/dL — AB (ref 8.9–10.3)
CO2: 29 mmol/L (ref 22–32)
CREATININE: 0.81 mg/dL (ref 0.44–1.00)
Chloride: 103 mmol/L (ref 101–111)
Glucose, Bld: 96 mg/dL (ref 65–99)
Potassium: 3.5 mmol/L (ref 3.5–5.1)
SODIUM: 136 mmol/L (ref 135–145)
TOTAL PROTEIN: 7 g/dL (ref 6.5–8.1)

## 2015-12-15 MED ORDER — SODIUM CHLORIDE 0.9 % IV SOLN
500.0000 mg/m2 | Freq: Once | INTRAVENOUS | Status: AC
  Administered 2015-12-15: 925 mg via INTRAVENOUS
  Filled 2015-12-15: qty 37

## 2015-12-15 MED ORDER — SODIUM CHLORIDE 0.9 % IJ SOLN
10.0000 mL | INTRAMUSCULAR | Status: DC | PRN
  Administered 2015-12-15: 10 mL
  Filled 2015-12-15: qty 10

## 2015-12-15 MED ORDER — SODIUM CHLORIDE 0.9 % IV SOLN
Freq: Once | INTRAVENOUS | Status: AC
  Administered 2015-12-15: 12:00:00 via INTRAVENOUS
  Filled 2015-12-15: qty 1000

## 2015-12-15 MED ORDER — HEPARIN SOD (PORK) LOCK FLUSH 100 UNIT/ML IV SOLN
500.0000 [IU] | Freq: Once | INTRAVENOUS | Status: AC | PRN
  Administered 2015-12-15: 500 [IU]
  Filled 2015-12-15: qty 5

## 2015-12-15 MED ORDER — CYANOCOBALAMIN 1000 MCG/ML IJ SOLN
1000.0000 ug | Freq: Once | INTRAMUSCULAR | Status: AC
  Administered 2015-12-15: 1000 ug via INTRAMUSCULAR
  Filled 2015-12-15: qty 1

## 2015-12-15 MED ORDER — POTASSIUM CHLORIDE CRYS ER 20 MEQ PO TBCR: 20.0000 meq | EXTENDED_RELEASE_TABLET | Freq: Every day | ORAL | Status: DC

## 2015-12-15 MED ORDER — SODIUM CHLORIDE 0.9 % IV SOLN
Freq: Once | INTRAVENOUS | Status: AC
  Administered 2015-12-15: 12:00:00 via INTRAVENOUS
  Filled 2015-12-15: qty 4

## 2015-12-15 NOTE — Progress Notes (Signed)
[place]  [phone]) [phone]  Fax:(336) [place] DOB: May 06, 1956  MR#: [id number]  HBZ#:[id number]  Patient Care Team: Marguerita Merles, MD as PCP - General (Family Medicine)  CHIEF COMPLAINT:  Chief Complaint  Patient presents with  . Lung Cancer    INTERVAL HISTORY: Patient returns for further evaluation and consideration of cycle 12 pemetrexed. Patient is tolerating her treatments well without significant side effects. Other than a little fatigue she feels good. She does have some mild swelling in her ankles. She has a good appetite and denies weight loss. She denies neurologic complaints. She denies any recent fevers. She denies any chest pain or shortness of breath. She denies any nausea, vomiting, constipation, or diarrhea. She denies pain. Patient offers no other specific complaints today.   REVIEW OF SYSTEMS:   Review of Systems  Constitutional: Positive for malaise/fatigue.  Respiratory: Negative.   Cardiovascular: Positive for leg swelling.  Genitourinary: Negative.   Musculoskeletal: Negative.   Skin: Negative.   Neurological: Negative.   Psychiatric/Behavioral: Negative.     As per HPI. Otherwise, a complete review of systems is negatve.   PAST MEDICAL HISTORY: Past Medical History  Diagnosis Date  . Lung cancer ([place])   . Hypertension   . Hypercholesteremia   . Metastatic lung cancer (metastasis from lung to other site) ([place]) 01/21/2015  . Anemia 01/21/2015  . 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 GERD (gastroesophageal reflux disease)     PAST SURGICAL HISTORY: Past Surgical History  Procedure Laterality Date  . Insertion / placement pleural catheter    . Portacath placement Right 04/26/2015    Procedure: INSERTION PORT-A-CATH;  Surgeon: Nestor Lewandowsky, MD;  Location: [facility] ORS;  Service: General;  Laterality: Right;    FAMILY HISTORY Family History  Problem Relation Age of Onset  . Cervical cancer Mother   .  Uterine cancer Maternal Grandmother     GYNECOLOGIC HISTORY:  No LMP recorded. Patient has had a hysterectomy.     ADVANCED DIRECTIVES:    HEALTH MAINTENANCE: Social History  Substance Use Topics  . Smoking status: Former Smoker -- 0.25 packs/day for 8 years    Types: Cigarettes    Quit date: 07/20/2014  . Smokeless tobacco: Not on file  . Alcohol Use: No    Allergies  Allergen Reactions  . Tegaderm Ag Mesh [Silver] Other (See Comments)    blisters    Current Outpatient Prescriptions  Medication Sig Dispense Refill  . ALPRAZolam (XANAX) 1 MG tablet Take 1 tablet (1 mg total) by mouth at bedtime as needed for anxiety. Take 1/2 tablet 1-2x per day as needed for anxiety and take 1 tablet at night for anxiety/sleeep (Patient taking differently: Take 1/2 tablet 1-2x per day as needed for anxiety and take 1 tablet at night for anxiety/sleeep) 45 tablet 0  . ferrous sulfate 325 (65 FE) MG tablet Take 325 mg by mouth daily with breakfast.    . furosemide (LASIX) 20 MG tablet Take 1 tablet daily as needed for swelling. 30 tablet 2  . HYDROcodone-acetaminophen (NORCO) 5-325 MG per tablet Take 1 tablet by mouth every 6 (six) hours as needed for moderate pain. 90 tablet 0  . lidocaine-prilocaine (EMLA) cream Apply cream 1 hour before chemotherapy treatment 30 g 1  . lisinopril-hydrochlorothiazide (PRINZIDE,ZESTORETIC) 10-12.5 MG per tablet Take 1 tablet by mouth daily.    . Multiple Vitamins-Minerals (MULTIVITAMIN WITH MINERALS) tablet Take 1 tablet by mouth daily. 30 tablet  3  . omeprazole (PRILOSEC) 20 MG capsule Take 20 mg by mouth every morning.     . potassium chloride (K-DUR) 10 MEQ tablet Take 1 tablet (10 mEq total) by mouth daily. Take 1 tablet daily for 6 days and then only take 1 daily as needed when you take furosemide tablet (Patient taking differently: Take 20 mEq by mouth daily. ) 90 tablet 0  . promethazine (PHENERGAN) 25 MG tablet Take 25 mg by mouth every 6 (six) hours as  needed for nausea or vomiting.    . simvastatin (ZOCOR) 20 MG tablet Take 20 mg by mouth at bedtime.      No current facility-administered medications for this visit.   Facility-Administered Medications Ordered in Other Visits  Medication Dose Route Frequency Provider Last Rate Last Dose  . heparin lock flush 100 unit/mL  250 Units Intracatheter PRN Evlyn Kanner, NP   250 Units at 01/21/15 1734  . heparin lock flush 100 unit/mL  500 Units Intracatheter Once PRN Leia Alf, MD      . sodium chloride 0.9 % injection 10 mL  10 mL Intracatheter PRN Evlyn Kanner, NP   10 mL at 01/21/15 1734  . sodium chloride 0.9 % injection 10 mL  10 mL Intravenous PRN Leia Alf, MD   10 mL at 04/27/15 0905  . sodium chloride 0.9 % injection 10 mL  10 mL Intracatheter PRN Leia Alf, MD      . sodium chloride 0.9 % injection 3 mL  3 mL Intracatheter PRN Evlyn Kanner, NP   3 mL at 01/21/15 1731    OBJECTIVE: BP 128/85 mmHg  Pulse 71  Temp(Src) 95.8 F (35.4 C) (Tympanic)  Resp 18  Wt 178 lb 5.6 oz (80.9 kg)   Body mass index is 29.68 kg/(m^2).    ECOG FS:0 - Asymptomatic  General: Well-developed, well-nourished, no acute distress. Eyes: Pink conjunctiva, anicteric sclera. HEENT: Normocephalic, moist mucous membranes, clear oropharnyx. Lungs: Clear to auscultation bilaterally. Heart: Regular rate and rhythm. No rubs, murmurs, or gallops. Musculoskeletal: Mild non pitting edema bilateral ankles, No cyanosis, or clubbing. Neuro: Alert, answering all questions appropriately.  Skin: No rashes or petechiae noted. Psych: Normal affect. Lymphatics: No cervical, calvicular, axillary or LAD.   LAB RESULTS:  Appointment on 12/15/2015  Component Date Value Ref Range Status  . WBC 12/15/2015 5.8  3.6 - 11.0 K/uL Final  . RBC 12/15/2015 3.89  3.80 - 5.20 MIL/uL Final  . Hemoglobin 12/15/2015 11.0* 12.0 - 16.0 g/dL Final  . HCT 12/15/2015 32.6* 35.0 - 47.0 % Final  . MCV 12/15/2015  83.8  80.0 - 100.0 fL Final  . MCH 12/15/2015 28.3  26.0 - 34.0 pg Final  . MCHC 12/15/2015 33.7  32.0 - 36.0 g/dL Final  . RDW 12/15/2015 14.5  11.5 - 14.5 % Final  . Platelets 12/15/2015 222  150 - 440 K/uL Final  . Neutrophils Relative % 12/15/2015 56   Final  . Neutro Abs 12/15/2015 3.2  1.4 - 6.5 K/uL Final  . Lymphocytes Relative 12/15/2015 23   Final  . Lymphs Abs 12/15/2015 1.3  1.0 - 3.6 K/uL Final  . Monocytes Relative 12/15/2015 16   Final  . Monocytes Absolute 12/15/2015 0.9  0.2 - 0.9 K/uL Final  . Eosinophils Relative 12/15/2015 4   Final  . Eosinophils Absolute 12/15/2015 0.2  0 - 0.7 K/uL Final  . Basophils Relative 12/15/2015 1   Final  . Basophils Absolute 12/15/2015 0.1  0 - 0.1 K/uL Final  . Sodium 12/15/2015 136  135 - 145 mmol/L Final  . Potassium 12/15/2015 3.5  3.5 - 5.1 mmol/L Final  . Chloride 12/15/2015 103  101 - 111 mmol/L Final  . CO2 12/15/2015 29  22 - 32 mmol/L Final  . Glucose, Bld 12/15/2015 96  65 - 99 mg/dL Final  . BUN 12/15/2015 19  6 - 20 mg/dL Final  . Creatinine, Ser 12/15/2015 0.81  0.44 - 1.00 mg/dL Final  . Calcium 12/15/2015 8.8* 8.9 - 10.3 mg/dL Final  . Total Protein 12/15/2015 7.0  6.5 - 8.1 g/dL Final  . Albumin 12/15/2015 3.4* 3.5 - 5.0 g/dL Final  . AST 12/15/2015 28  15 - 41 U/L Final  . ALT 12/15/2015 29  14 - 54 U/L Final  . Alkaline Phosphatase 12/15/2015 67  38 - 126 U/L Final  . Total Bilirubin 12/15/2015 0.8  0.3 - 1.2 mg/dL Final  . GFR calc non Af Amer 12/15/2015 >60  >60 mL/min Final  . GFR calc Af Amer 12/15/2015 >60  >60 mL/min Final   Comment: (NOTE) The eGFR has been calculated using the CKD EPI equation. This calculation has not been validated in all clinical situations. eGFR's persistently <60 mL/min signify possible Chronic Kidney Disease.   . Anion gap 12/15/2015 4* 5 - 15 Final    STUDIES: 12/01/14 - Right Pleural Fluid Cytology. POSITIVE FOR MALIGNANT CELLS. ADENOCARCINOMA, CONSISTENT WITH LUNG  ORIGIN.  12/11/14 - CT head. IMPRESSION: 1. Multiple bilateral enhancing mass lesions compatible with metastatic disease to the brain. MRI would be more sensitive for small lesions if delineation of the specific number of lesions is important for the patient's treatment. 2. No evidence for hemorrhage or significant mass effect.  12/30/14 - CT Chest. IMPRESSION: 1. Decreased right hydropneumothorax with removal of right pleural catheter. Small residual subpulmonic fluid component persists with scattered foci of extrapleural air. Some improved aeration of the right lower lobe,, however diffuse lower lobe and perihilar right middle lobe consolidation persists. It is difficult to differentiate malignancy from atelectasis or pneumonia in the setting, however patient has had recent PET which better characterized this. 2. Diffuse pleural thickening and nodularity in the right hemithorax. Diffuse pulmonary metastatic disease.  03/08/15 - CT Chest. IMPRESSION: 1. Overall mild partial interval response to therapy. Tumor involving the right lower lobe appears decreased from previous exam with associated improved aeration to the right lung. 2. Numerous pulmonary nodules are again noted within both lungs consistent with metastatic disease. These appear stable in the interval. 3. Resolution of previous right supraclavicular and right paratracheal adenopathy. 4. Aortic atherosclerosis.  04/20/15 - CT scan of Chest. IMPRESSION: 1. Mild partial interval treatment response. Decreased size of spiculated central right lower lobe lung mass, likely the primary tumor. 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 Stable to mildly decreased pulmonary metastases. 2. Mildly increased small circumferential right pleural effusion. 3. Stable mild right hilar and right pericardiophrenic lymphadenopathy. 4. Stable indeterminate hypodense 1.2 cm left upper renal lesion.  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 On 12/01/14 - Right Pleural Fluid Cytology. POSITIVE FOR MALIGNANT CELLS. ADENOCARCINOMA, CONSISTENT WITH LUNG ORIGIN. Molecular testing for EGFR, ALK and others is negative  PLAN:   1. Lung Cancer: Proceed with cycle 12 single agent pemetrexed 500 mg/m2 IV today. Patient will also receive B12 injections  with every treatment. Return to clinic in 3 weeks for CT results and further evaluation. 2. Headaches: Resolved. CT head onMarch 23, 2016 reported multiple brain metastasis - now off Decadron given recent severe infection, got palliative brain radiation. Repeat MRI from January 24th shows improvement with 2 or the 4 lesions cleared and the other two decreased in size.  3. Pain - Resolved. Continue Norco prn. 4. Kidney lesion: Unclear if metastatic deposit or second primary of the left kidney. Continue to monitor closely.  5. Anxiety: Resolved. Continue Xanax 1 mg at night and 0.5 mg as needed during the day. 6. Hypokalemia: 3.5 today. Continue potassium supplements. 7. Anemia: Mild. Monitor 8. Hypocalcemia: Corrects to 9.02. Monitor.   Patient expressed understanding and was in agreement with this plan. She also understands that She can call clinic at any time with any questions, concerns, or complaints.   Dr. Rogue Bussing was available for consultation and review of plan of care for this patient.  Metastatic lung cancer (metastasis from lung to other site) [facility])   Staging form: Lung, AJCC 7th Edition     Clinical: Stage Unknown ([place], NX, M1) - Signed by Evlyn Kanner, NP on 01/21/2015   Mayra Reel, NP   12/15/2015 11:20 AM   Patient was seen and evaluated independently and I agree with the assessment and plan as dictated above.  Georgeanne Nim, NP 12/15/2015 11:20am

## 2015-12-15 NOTE — Progress Notes (Signed)
Patient request refill of potassium tablets. Is having swelling in both ankles. Has questions regarding iron tablets.

## 2015-12-17 ENCOUNTER — Other Ambulatory Visit: Payer: Self-pay | Admitting: *Deleted

## 2015-12-17 MED ORDER — POTASSIUM CHLORIDE CRYS ER 20 MEQ PO TBCR: 20.0000 meq | EXTENDED_RELEASE_TABLET | Freq: Every day | ORAL | Status: DC

## 2016-01-06 ENCOUNTER — Ambulatory Visit
Admission: RE | Admit: 2016-01-06 | Discharge: 2016-01-06 | Disposition: A | Discharge status: Home / Self Care | Payer: BLUE CROSS/BLUE SHIELD | Source: Ambulatory Visit | Attending: Oncology | Admitting: Oncology

## 2016-01-06 DIAGNOSIS — C3491 Malignant neoplasm of unspecified part of right bronchus or lung: Secondary | ICD-10-CM

## 2016-01-07 ENCOUNTER — Ambulatory Visit
Admission: RE | Admit: 2016-01-07 | Discharge: 2016-01-07 | Disposition: A | Discharge status: Home / Self Care | Payer: BLUE CROSS/BLUE SHIELD | Source: Ambulatory Visit | Attending: Oncology | Admitting: Oncology

## 2016-01-07 DIAGNOSIS — J90 Pleural effusion, not elsewhere classified: Secondary | ICD-10-CM | POA: Diagnosis not present

## 2016-01-07 DIAGNOSIS — J432 Centrilobular emphysema: Secondary | ICD-10-CM | POA: Insufficient documentation

## 2016-01-07 DIAGNOSIS — R918 Other nonspecific abnormal finding of lung field: Secondary | ICD-10-CM | POA: Insufficient documentation

## 2016-01-07 DIAGNOSIS — M5136 Other intervertebral disc degeneration, lumbar region: Secondary | ICD-10-CM | POA: Insufficient documentation

## 2016-01-07 DIAGNOSIS — C3491 Malignant neoplasm of unspecified part of right bronchus or lung: Secondary | ICD-10-CM | POA: Diagnosis not present

## 2016-01-07 DIAGNOSIS — K5730 Diverticulosis of large intestine without perforation or abscess without bleeding: Secondary | ICD-10-CM | POA: Diagnosis not present

## 2016-01-07 DIAGNOSIS — N289 Disorder of kidney and ureter, unspecified: Secondary | ICD-10-CM | POA: Diagnosis not present

## 2016-01-07 DIAGNOSIS — I700 Atherosclerosis of aorta: Secondary | ICD-10-CM | POA: Diagnosis not present

## 2016-01-07 DIAGNOSIS — M47814 Spondylosis without myelopathy or radiculopathy, thoracic region: Secondary | ICD-10-CM | POA: Diagnosis not present

## 2016-01-07 DIAGNOSIS — M5134 Other intervertebral disc degeneration, thoracic region: Secondary | ICD-10-CM | POA: Insufficient documentation

## 2016-01-07 DIAGNOSIS — I2510 Atherosclerotic heart disease of native coronary artery without angina pectoris: Secondary | ICD-10-CM | POA: Diagnosis not present

## 2016-01-07 MED ORDER — IOPAMIDOL (ISOVUE-300) INJECTION 61%
100.0000 mL | Freq: Once | INTRAVENOUS | Status: AC | PRN
  Administered 2016-01-07: 100 mL via INTRAVENOUS

## 2016-01-10 ENCOUNTER — Inpatient Hospital Stay: Payer: BLUE CROSS/BLUE SHIELD | Attending: Oncology | Admitting: Oncology

## 2016-01-10 ENCOUNTER — Encounter: Payer: Self-pay | Admitting: Oncology

## 2016-01-10 VITALS — BP 155/89 | Temp 97.9°F | Wt 177.5 lb

## 2016-01-10 DIAGNOSIS — J910 Malignant pleural effusion: Secondary | ICD-10-CM | POA: Diagnosis not present

## 2016-01-10 DIAGNOSIS — K219 Gastro-esophageal reflux disease without esophagitis: Secondary | ICD-10-CM | POA: Insufficient documentation

## 2016-01-10 DIAGNOSIS — Z79899 Other long term (current) drug therapy: Secondary | ICD-10-CM | POA: Diagnosis not present

## 2016-01-10 DIAGNOSIS — N289 Disorder of kidney and ureter, unspecified: Secondary | ICD-10-CM | POA: Diagnosis not present

## 2016-01-10 DIAGNOSIS — R600 Localized edema: Secondary | ICD-10-CM | POA: Insufficient documentation

## 2016-01-10 DIAGNOSIS — Z87891 Personal history of nicotine dependence: Secondary | ICD-10-CM | POA: Diagnosis not present

## 2016-01-10 DIAGNOSIS — K0889 Other specified disorders of teeth and supporting structures: Secondary | ICD-10-CM | POA: Diagnosis not present

## 2016-01-10 DIAGNOSIS — C7931 Secondary malignant neoplasm of brain: Secondary | ICD-10-CM | POA: Diagnosis not present

## 2016-01-10 DIAGNOSIS — J432 Centrilobular emphysema: Secondary | ICD-10-CM | POA: Insufficient documentation

## 2016-01-10 DIAGNOSIS — Z808 Family history of malignant neoplasm of other organs or systems: Secondary | ICD-10-CM | POA: Insufficient documentation

## 2016-01-10 DIAGNOSIS — I10 Essential (primary) hypertension: Secondary | ICD-10-CM | POA: Diagnosis not present

## 2016-01-10 DIAGNOSIS — E7800 Pure hypercholesterolemia, unspecified: Secondary | ICD-10-CM | POA: Insufficient documentation

## 2016-01-10 DIAGNOSIS — C7802 Secondary malignant neoplasm of left lung: Secondary | ICD-10-CM | POA: Diagnosis not present

## 2016-01-10 DIAGNOSIS — C3491 Malignant neoplasm of unspecified part of right bronchus or lung: Secondary | ICD-10-CM | POA: Diagnosis not present

## 2016-01-10 DIAGNOSIS — K521 Toxic gastroenteritis and colitis: Secondary | ICD-10-CM | POA: Diagnosis not present

## 2016-01-10 MED ORDER — LIDOCAINE-PRILOCAINE 2.5-2.5 % EX CREA: TOPICAL_CREAM | CUTANEOUS | Status: DC

## 2016-01-10 NOTE — Progress Notes (Signed)
[place]  Telephone:(336) [phone]  Fax:(336) [place] DOB: [birth year]  MR#: [id number]  KJZ#:[id number]  Patient Care Team: Marguerita Merles, MD as PCP - General (Family Medicine)  CHIEF COMPLAINT:  Chief Complaint  Patient presents with  . Lung Cancer    INTERVAL HISTORY: Patient returns for further evaluation and discussion of her imaging results. She continues to have diarrhea secondary to her oral contrast, but otherwise feels well.  She has some sore teeth and has an appointment with her dentist in the near future.  She denies any recent fevers. She has a good appetite and denies weight loss. She denies neurologic complaints. She denies any chest pain, hemoptysis, or shortness of breath. She denies any nausea, vomiting, or constipation. She denies pain. Patient offers no other specific complaints today.   REVIEW OF SYSTEMS:   Review of Systems  Constitutional: Negative for fever, weight loss and malaise/fatigue.  Respiratory: Negative.   Cardiovascular: Negative for leg swelling.  Gastrointestinal: Positive for diarrhea. Negative for blood in stool and melena.  Genitourinary: Negative.   Musculoskeletal: Negative.   Skin: Negative.   Neurological: Negative.  Negative for weakness.  Psychiatric/Behavioral: Negative.     As per HPI. Otherwise, a complete review of systems is negatve.   PAST MEDICAL HISTORY: Past Medical History  Diagnosis Date  . Lung cancer ([place])   . Hypertension   . Hypercholesteremia   . Metastatic lung cancer (metastasis from lung to other site) ([place]) 01/21/2015  . Anemia 01/21/2015  . 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 GERD (gastroesophageal reflux disease)   . Last menstrual period (LMP) > 10 days ago 2007    PAST SURGICAL HISTORY: Past Surgical History  Procedure Laterality Date  . Insertion / placement pleural catheter    . Portacath placement Right 04/26/2015    Procedure: INSERTION PORT-A-CATH;   Surgeon: Nestor Lewandowsky, MD;  Location: [facility] ORS;  Service: General;  Laterality: Right;  . Abdominal hysterectomy      partial    FAMILY HISTORY Family History  Problem Relation Age of Onset  . Cervical cancer Mother   . Uterine cancer Maternal Grandmother     GYNECOLOGIC HISTORY:  No LMP recorded. Patient has had a hysterectomy.     ADVANCED DIRECTIVES:    HEALTH MAINTENANCE: Social History  Substance Use Topics  . Smoking status: Former Smoker -- 0.25 packs/day for 8 years    Types: Cigarettes    Quit date: 07/20/2014  . Smokeless tobacco: Not on file  . Alcohol Use: No    Allergies  Allergen Reactions  . Tegaderm Ag Mesh [Silver] Other (See Comments)    blisters    Current Outpatient Prescriptions  Medication Sig Dispense Refill  . ALPRAZolam (XANAX) 1 MG tablet Take 1 tablet (1 mg total) by mouth at bedtime as needed for anxiety. Take 1/2 tablet 1-2x per day as needed for anxiety and take 1 tablet at night for anxiety/sleeep (Patient taking differently: Take 1/2 tablet 1-2x per day as needed for anxiety and take 1 tablet at night for anxiety/sleeep) 45 tablet 0  . ferrous sulfate 325 (65 FE) MG tablet Take 325 mg by mouth daily with breakfast.    . furosemide (LASIX) 20 MG tablet Take 1 tablet daily as needed for swelling. 30 tablet 2  . HYDROcodone-acetaminophen (NORCO) 5-325 MG per tablet Take 1 tablet by mouth every 6 (six) hours as needed for moderate pain. 90 tablet 0  .  lidocaine-prilocaine (EMLA) cream Apply cream 1 hour before chemotherapy treatment 30 g 1  . lisinopril-hydrochlorothiazide (PRINZIDE,ZESTORETIC) 10-12.5 MG per tablet Take 1 tablet by mouth daily.    . Multiple Vitamins-Minerals (MULTIVITAMIN WITH MINERALS) tablet Take 1 tablet by mouth daily. 30 tablet 3  . omeprazole (PRILOSEC) 20 MG capsule Take 20 mg by mouth every morning.     . potassium chloride SA (K-DUR,KLOR-CON) 20 MEQ tablet Take 1 tablet (20 mEq total) by mouth daily. 90 tablet 0  .  promethazine (PHENERGAN) 25 MG tablet Take 25 mg by mouth every 6 (six) hours as needed for nausea or vomiting.    . simvastatin (ZOCOR) 20 MG tablet Take 20 mg by mouth at bedtime.     . lidocaine-prilocaine (EMLA) cream Apply to affected area once 30 g 3   No current facility-administered medications for this visit.   Facility-Administered Medications Ordered in Other Visits  Medication Dose Route Frequency Provider Last Rate Last Dose  . heparin lock flush 100 unit/mL  250 Units Intracatheter PRN Evlyn Kanner, NP   250 Units at 01/21/15 1734  . sodium chloride 0.9 % injection 10 mL  10 mL Intracatheter PRN Evlyn Kanner, NP   10 mL at 01/21/15 1734  . sodium chloride 0.9 % injection 10 mL  10 mL Intravenous PRN Leia Alf, MD   10 mL at 04/27/15 0905  . sodium chloride 0.9 % injection 3 mL  3 mL Intracatheter PRN Evlyn Kanner, NP   3 mL at 01/21/15 1731    OBJECTIVE: BP 155/89 mmHg  Temp(Src) 97.9 F (36.6 C) (Oral)  Wt 177 lb 7.5 oz (80.5 kg)   Body mass index is 29.53 kg/(m^2).    ECOG FS:0 - Asymptomatic  General: Well-developed, well-nourished, no acute distress. Eyes: Pink conjunctiva, anicteric sclera. Lungs: Clear to auscultation bilaterally. Heart: Regular rate and rhythm. No rubs, murmurs, or gallops. Musculoskeletal: Mild non pitting edema bilateral ankles, No cyanosis, or clubbing. Neuro: Alert, answering all questions appropriately.  Skin: No rashes or petechiae noted. Psych: Normal affect.    LAB RESULTS:  No visits with results within 3 Day(s) from this visit. Latest known visit with results is:  Appointment on 12/15/2015  Component Date Value Ref Range Status  . WBC 12/15/2015 5.8  3.6 - 11.0 K/uL Final  . RBC 12/15/2015 3.89  3.80 - 5.20 MIL/uL Final  . Hemoglobin 12/15/2015 11.0* 12.0 - 16.0 g/dL Final  . HCT 12/15/2015 32.6* 35.0 - 47.0 % Final  . MCV 12/15/2015 83.8  80.0 - 100.0 fL Final  . MCH 12/15/2015 28.3  26.0 - 34.0 pg Final  .  MCHC 12/15/2015 33.7  32.0 - 36.0 g/dL Final  . RDW 12/15/2015 14.5  11.5 - 14.5 % Final  . Platelets 12/15/2015 222  150 - 440 K/uL Final  . Neutrophils Relative % 12/15/2015 56   Final  . Neutro Abs 12/15/2015 3.2  1.4 - 6.5 K/uL Final  . Lymphocytes Relative 12/15/2015 23   Final  . Lymphs Abs 12/15/2015 1.3  1.0 - 3.6 K/uL Final  . Monocytes Relative 12/15/2015 16   Final  . Monocytes Absolute 12/15/2015 0.9  0.2 - 0.9 K/uL Final  . Eosinophils Relative 12/15/2015 4   Final  . Eosinophils Absolute 12/15/2015 0.2  0 - 0.7 K/uL Final  . Basophils Relative 12/15/2015 1   Final  . Basophils Absolute 12/15/2015 0.1  0 - 0.1 K/uL Final  . Sodium 12/15/2015 136  135 - 145  mmol/L Final  . Potassium 12/15/2015 3.5  3.5 - 5.1 mmol/L Final  . Chloride 12/15/2015 103  101 - 111 mmol/L Final  . CO2 12/15/2015 29  22 - 32 mmol/L Final  . Glucose, Bld 12/15/2015 96  65 - 99 mg/dL Final  . BUN 12/15/2015 19  6 - 20 mg/dL Final  . Creatinine, Ser 12/15/2015 0.81  0.44 - 1.00 mg/dL Final  . Calcium 12/15/2015 8.8* 8.9 - 10.3 mg/dL Final  . Total Protein 12/15/2015 7.0  6.5 - 8.1 g/dL Final  . Albumin 12/15/2015 3.4* 3.5 - 5.0 g/dL Final  . AST 12/15/2015 28  15 - 41 U/L Final  . ALT 12/15/2015 29  14 - 54 U/L Final  . Alkaline Phosphatase 12/15/2015 67  38 - 126 U/L Final  . Total Bilirubin 12/15/2015 0.8  0.3 - 1.2 mg/dL Final  . GFR calc non Af Amer 12/15/2015 >60  >60 mL/min Final  . GFR calc Af Amer 12/15/2015 >60  >60 mL/min Final   Comment: (NOTE) The eGFR has been calculated using the CKD EPI equation. This calculation has not been validated in all clinical situations. eGFR's persistently <60 mL/min signify possible Chronic Kidney Disease.   . Anion gap 12/15/2015 4* 5 - 15 Final    STUDIES:  IMPRESSION: 1. New and enlarging bilateral pulmonary nodules. Nodules are currently too numerous to count. An index nodule in the left lower lobe is increased from 1.2 cm diameter to 1.8 cm  diameter. 2. Centrilobular emphysema with airway thickening. 3. 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 4. Small right pleural effusion, likely malignant. 5. 1.5 by 1.8 cm hyperdense exophytic left kidney upper pole lesion, complex cyst versus mass. Not visibly hypermetabolic on prior PET-CT, and with similar hyperdensity on portal venous and delayed phase images.  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 On 12/01/14 - Right Pleural Fluid Cytology. POSITIVE FOR MALIGNANT CELLS. ADENOCARCINOMA, CONSISTENT WITH LUNG ORIGIN. Molecular testing for EGFR, ALK and others is negative  PLAN:   1. Lung Cancer: CT scan results reviewed independently and reported as above with significant progression of disease in bilateral lungs. Will discontinue pemetrexed and patient will return to clinic in 1 week to receive palliative immunotherapy using nivolumab. She will receive this treatment every 2 weeks and plan to reimage in approximately July 2017.  2. Headaches: Resolved. CT head onMarch 23, 2016 reported multiple brain metastasis - now off Decadron given recent severe infection, got palliative brain radiation. Repeat MRI from January 24th shows improvement with 2 or the 4 lesions cleared and the other two decreased in size.  3. Pain: Resolved. Continue Norco as needed. 4. Kidney lesion: Unclear if metastatic deposit or second primary of the left kidney. Continue to monitor closely.  5. Anxiety: Resolved. Continue Xanax 1 mg at night and 0.5 mg as needed during the day.     6. Diarrhea: Secondary to oral contrast, monitor.   Patient expressed understanding and was in agreement with this plan. She also understands that She can call clinic at any time with any questions, concerns, or complaints.    Metastatic lung cancer (metastasis from  lung to other site) [facility])   Staging form: Lung, AJCC 7th Edition     Clinical: Stage Unknown ([place], NX, M1) - Signed by Evlyn Kanner, NP on 01/21/2015   Lloyd Huger, MD  01/10/2016 10:48 AM

## 2016-01-10 NOTE — Progress Notes (Signed)
Patient is here for metastatic lung cancer f/u and discuss imaging results.  Only concern patient brings up today is the pain in middle of chest when she inhales a deep breath.

## 2016-01-18 ENCOUNTER — Inpatient Hospital Stay: Payer: BLUE CROSS/BLUE SHIELD | Attending: Oncology

## 2016-01-18 ENCOUNTER — Inpatient Hospital Stay: Payer: BLUE CROSS/BLUE SHIELD

## 2016-01-18 ENCOUNTER — Inpatient Hospital Stay (HOSPITAL_BASED_OUTPATIENT_CLINIC_OR_DEPARTMENT_OTHER): Payer: BLUE CROSS/BLUE SHIELD | Admitting: Oncology

## 2016-01-18 VITALS — BP 120/81 | HR 78 | Temp 97.7°F | Resp 16 | Wt 176.4 lb

## 2016-01-18 DIAGNOSIS — G4700 Insomnia, unspecified: Secondary | ICD-10-CM | POA: Insufficient documentation

## 2016-01-18 DIAGNOSIS — C7931 Secondary malignant neoplasm of brain: Secondary | ICD-10-CM | POA: Diagnosis not present

## 2016-01-18 DIAGNOSIS — Z87891 Personal history of nicotine dependence: Secondary | ICD-10-CM | POA: Insufficient documentation

## 2016-01-18 DIAGNOSIS — Z808 Family history of malignant neoplasm of other organs or systems: Secondary | ICD-10-CM | POA: Insufficient documentation

## 2016-01-18 DIAGNOSIS — R531 Weakness: Secondary | ICD-10-CM | POA: Diagnosis not present

## 2016-01-18 DIAGNOSIS — Z79899 Other long term (current) drug therapy: Secondary | ICD-10-CM | POA: Diagnosis not present

## 2016-01-18 DIAGNOSIS — E7800 Pure hypercholesterolemia, unspecified: Secondary | ICD-10-CM | POA: Diagnosis not present

## 2016-01-18 DIAGNOSIS — J432 Centrilobular emphysema: Secondary | ICD-10-CM | POA: Insufficient documentation

## 2016-01-18 DIAGNOSIS — R5381 Other malaise: Secondary | ICD-10-CM | POA: Insufficient documentation

## 2016-01-18 DIAGNOSIS — M25552 Pain in left hip: Secondary | ICD-10-CM | POA: Insufficient documentation

## 2016-01-18 DIAGNOSIS — E876 Hypokalemia: Secondary | ICD-10-CM | POA: Insufficient documentation

## 2016-01-18 DIAGNOSIS — J910 Malignant pleural effusion: Secondary | ICD-10-CM | POA: Insufficient documentation

## 2016-01-18 DIAGNOSIS — Z5112 Encounter for antineoplastic immunotherapy: Secondary | ICD-10-CM | POA: Insufficient documentation

## 2016-01-18 DIAGNOSIS — C3491 Malignant neoplasm of unspecified part of right bronchus or lung: Secondary | ICD-10-CM

## 2016-01-18 DIAGNOSIS — M549 Dorsalgia, unspecified: Secondary | ICD-10-CM | POA: Diagnosis not present

## 2016-01-18 DIAGNOSIS — I10 Essential (primary) hypertension: Secondary | ICD-10-CM | POA: Diagnosis not present

## 2016-01-18 DIAGNOSIS — R5383 Other fatigue: Secondary | ICD-10-CM

## 2016-01-18 DIAGNOSIS — R600 Localized edema: Secondary | ICD-10-CM | POA: Insufficient documentation

## 2016-01-18 DIAGNOSIS — C7802 Secondary malignant neoplasm of left lung: Secondary | ICD-10-CM | POA: Diagnosis not present

## 2016-01-18 DIAGNOSIS — R51 Headache: Secondary | ICD-10-CM | POA: Diagnosis not present

## 2016-01-18 DIAGNOSIS — N289 Disorder of kidney and ureter, unspecified: Secondary | ICD-10-CM | POA: Insufficient documentation

## 2016-01-18 DIAGNOSIS — K219 Gastro-esophageal reflux disease without esophagitis: Secondary | ICD-10-CM | POA: Insufficient documentation

## 2016-01-18 DIAGNOSIS — M79605 Pain in left leg: Secondary | ICD-10-CM | POA: Insufficient documentation

## 2016-01-18 LAB — CBC WITH DIFFERENTIAL/PLATELET
BASOS PCT: 1 %
Basophils Absolute: 0.1 10*3/uL (ref 0–0.1)
EOS PCT: 3 %
Eosinophils Absolute: 0.2 10*3/uL (ref 0–0.7)
HEMATOCRIT: 36.5 % (ref 35.0–47.0)
Hemoglobin: 12.3 g/dL (ref 12.0–16.0)
LYMPHS PCT: 22 %
Lymphs Abs: 1.4 10*3/uL (ref 1.0–3.6)
MCH: 28.5 pg (ref 26.0–34.0)
MCHC: 33.7 g/dL (ref 32.0–36.0)
MCV: 84.5 fL (ref 80.0–100.0)
MONO ABS: 0.5 10*3/uL (ref 0.2–0.9)
MONOS PCT: 7 %
NEUTROS ABS: 4.3 10*3/uL (ref 1.4–6.5)
Neutrophils Relative %: 67 %
PLATELETS: 174 10*3/uL (ref 150–440)
RBC: 4.33 MIL/uL (ref 3.80–5.20)
RDW: 13.9 % (ref 11.5–14.5)
WBC: 6.4 10*3/uL (ref 3.6–11.0)

## 2016-01-18 LAB — COMPREHENSIVE METABOLIC PANEL
ALT: 23 U/L (ref 14–54)
AST: 29 U/L (ref 15–41)
Albumin: 3.8 g/dL (ref 3.5–5.0)
Alkaline Phosphatase: 55 U/L (ref 38–126)
Anion gap: 7 (ref 5–15)
BUN: 17 mg/dL (ref 6–20)
CHLORIDE: 103 mmol/L (ref 101–111)
CO2: 27 mmol/L (ref 22–32)
CREATININE: 0.8 mg/dL (ref 0.44–1.00)
Calcium: 9.4 mg/dL (ref 8.9–10.3)
GFR calc Af Amer: >60 mL/min (ref >60)
GFR calc non Af Amer: >60 mL/min (ref >60)
Glucose, Bld: 134 mg/dL — ABNORMAL HIGH (ref 65–99)
POTASSIUM: 3.3 mmol/L — AB (ref 3.5–5.1)
Sodium: 137 mmol/L (ref 135–145)
Total Bilirubin: 1.1 mg/dL (ref 0.3–1.2)
Total Protein: 7.2 g/dL (ref 6.5–8.1)

## 2016-01-18 MED ORDER — SODIUM CHLORIDE 0.9 % IV SOLN
240.0000 mg | Freq: Once | INTRAVENOUS | Status: AC
  Administered 2016-01-18: 240 mg via INTRAVENOUS
  Filled 2016-01-18: qty 24

## 2016-01-18 MED ORDER — SODIUM CHLORIDE 0.9% FLUSH
10.0000 mL | INTRAVENOUS | Status: DC | PRN
  Administered 2016-01-18: 10 mL via INTRAVENOUS
  Filled 2016-01-18: qty 10

## 2016-01-18 MED ORDER — HEPARIN SOD (PORK) LOCK FLUSH 100 UNIT/ML IV SOLN
500.0000 [IU] | Freq: Once | INTRAVENOUS | Status: DC | PRN
  Filled 2016-01-18: qty 5

## 2016-01-18 MED ORDER — HEPARIN SOD (PORK) LOCK FLUSH 100 UNIT/ML IV SOLN: 500.0000 [IU] | Freq: Once | INTRAVENOUS | Status: DC

## 2016-01-18 MED ORDER — SODIUM CHLORIDE 0.9 % IV SOLN
Freq: Once | INTRAVENOUS | Status: AC
  Administered 2016-01-18: 10:00:00 via INTRAVENOUS
  Filled 2016-01-18: qty 1000

## 2016-01-18 NOTE — Progress Notes (Signed)
Patient had a headache yesterday where the pain was behind her left ear.

## 2016-01-18 NOTE — Progress Notes (Signed)
Patricia Kelley  [phone]  Fax:(336) [place] DOB: 1955-11-02  MR#: [id number]  FIE#:[id number]  Patient Care Team: Marguerita Merles, MD as PCP - General (Family Medicine)  CHIEF COMPLAINT:  Chief Complaint  Patient presents with  . Lung Cancer    INTERVAL HISTORY: Patient returns for further evaluation and initiation of Opdivo. She complains of a headache behind her left ear that feels better now after taking tylenol. She is very tired today, stating she doesn't sleep well. She also has some left side hip pain but it feels better when she stretches.  She denies any recent fevers. She has a good appetite and denies weight loss. She denies neurologic complaints. She denies any chest pain, hemoptysis, or shortness of breath. She denies any nausea, vomiting, diarrhea or constipation.  Patient offers no other specific complaints today.   REVIEW OF SYSTEMS:   Review of Systems  Constitutional: Negative for fever, weight loss and malaise/fatigue.  Respiratory: Negative.   Cardiovascular: Negative for leg swelling.  Gastrointestinal: Negative for diarrhea, blood in stool and melena.  Genitourinary: Negative.   Musculoskeletal:       Left hip pain  Skin: Negative.   Neurological: Positive for headaches. Negative for weakness.  Psychiatric/Behavioral: Negative.     As per HPI. Otherwise, a complete review of systems is negatve.   PAST MEDICAL HISTORY: Past Medical History  Diagnosis Date  . Lung cancer ([place])   . Hypertension   . Hypercholesteremia   . Metastatic lung cancer (metastasis from lung to other site) ([place]) 01/21/2015  . Anemia 01/21/2015  . 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 GERD (gastroesophageal reflux disease)   . Last menstrual period (LMP) > 10 days ago 2007    PAST SURGICAL HISTORY: Past Surgical History  Procedure Laterality Date  . Insertion / placement pleural catheter    . Portacath placement Right 04/26/2015     Procedure: INSERTION PORT-A-CATH;  Surgeon: Nestor Lewandowsky, MD;  Location: [facility] ORS;  Service: General;  Laterality: Right;  . Abdominal hysterectomy      partial    FAMILY HISTORY Family History  Problem Relation Age of Onset  . Cervical cancer Mother   . Uterine cancer Maternal Grandmother     GYNECOLOGIC HISTORY:  No LMP recorded. Patient has had a hysterectomy.     ADVANCED DIRECTIVES:    HEALTH MAINTENANCE: Social History  Substance Use Topics  . Smoking status: Former Smoker -- 0.25 packs/day for 8 years    Types: Cigarettes    Quit date: 07/20/2014  . Smokeless tobacco: Not on file  . Alcohol Use: No    Allergies  Allergen Reactions  . Tegaderm Ag Mesh [Silver] Other (See Comments)    blisters    Current Outpatient Prescriptions  Medication Sig Dispense Refill  . ALPRAZolam (XANAX) 1 MG tablet Take 1 tablet (1 mg total) by mouth at bedtime as needed for anxiety. Take 1/2 tablet 1-2x per day as needed for anxiety and take 1 tablet at night for anxiety/sleeep (Patient taking differently: Take 1/2 tablet 1-2x per day as needed for anxiety and take 1 tablet at night for anxiety/sleeep) 45 tablet 0  . ferrous sulfate 325 (65 FE) MG tablet Take 325 mg by mouth daily with breakfast.    . furosemide (LASIX) 20 MG tablet Take 1 tablet daily as needed for swelling. 30 tablet 2  . HYDROcodone-acetaminophen (NORCO) 5-325 MG per tablet Take 1 tablet by mouth every  6 (six) hours as needed for moderate pain. 90 tablet 0  . lidocaine-prilocaine (EMLA) cream Apply cream 1 hour before chemotherapy treatment 30 g 1  . lidocaine-prilocaine (EMLA) cream Apply to affected area once 30 g 3  . lisinopril-hydrochlorothiazide (PRINZIDE,ZESTORETIC) 10-12.5 MG per tablet Take 1 tablet by mouth daily.    . Multiple Vitamins-Minerals (MULTIVITAMIN WITH MINERALS) tablet Take 1 tablet by mouth daily. 30 tablet 3  . omeprazole (PRILOSEC) 20 MG capsule Take 20 mg by mouth every morning.     .  potassium chloride SA (K-DUR,KLOR-CON) 20 MEQ tablet Take 1 tablet (20 mEq total) by mouth daily. 90 tablet 0  . promethazine (PHENERGAN) 25 MG tablet Take 25 mg by mouth every 6 (six) hours as needed for nausea or vomiting.    . simvastatin (ZOCOR) 20 MG tablet Take 20 mg by mouth at bedtime.      No current facility-administered medications for this visit.   Facility-Administered Medications Ordered in Other Visits  Medication Dose Route Frequency Provider Last Rate Last Dose  . heparin lock flush 100 unit/mL  250 Units Intracatheter PRN Evlyn Kanner, NP   250 Units at 01/21/15 1734  . heparin lock flush 100 unit/mL  500 Units Intravenous Once Lloyd Huger, MD      . heparin lock flush 100 unit/mL  500 Units Intracatheter Once PRN Lloyd Huger, MD      . nivolumab (OPDIVO) 240 mg in sodium chloride 0.9 % 100 mL chemo infusion  240 mg Intravenous Once Lloyd Huger, MD      . sodium chloride 0.9 % injection 10 mL  10 mL Intracatheter PRN Evlyn Kanner, NP   10 mL at 01/21/15 1734  . sodium chloride 0.9 % injection 10 mL  10 mL Intravenous PRN Leia Alf, MD   10 mL at 04/27/15 0905  . sodium chloride 0.9 % injection 3 mL  3 mL Intracatheter PRN Evlyn Kanner, NP   3 mL at 01/21/15 1731  . sodium chloride flush (NS) 0.9 % injection 10 mL  10 mL Intravenous PRN Lloyd Huger, MD   10 mL at 01/18/16 0900    OBJECTIVE: BP 120/81 mmHg  Pulse 78  Temp(Src) 97.7 F (36.5 C) (Tympanic)  Resp 16  Wt 176 lb 5.9 oz (80 kg)   Body mass index is 29.35 kg/(m^2).    ECOG FS:0 - Asymptomatic  General: Well-developed, well-nourished, no acute distress. Eyes: Pink conjunctiva, anicteric sclera. Lungs: Clear to auscultation bilaterally. Heart: Regular rate and rhythm. No rubs, murmurs, or gallops. Musculoskeletal: Mild non pitting edema bilateral ankles, No cyanosis, or clubbing. Neuro: Alert, answering all questions appropriately.  Skin: No rashes or petechiae  noted. Psych: Normal affect.    LAB RESULTS:  Appointment on 01/18/2016  Component Date Value Ref Range Status  . WBC 01/18/2016 6.4  3.6 - 11.0 K/uL Final  . RBC 01/18/2016 4.33  3.80 - 5.20 MIL/uL Final  . Hemoglobin 01/18/2016 12.3  12.0 - 16.0 g/dL Final  . HCT 01/18/2016 36.5  35.0 - 47.0 % Final  . MCV 01/18/2016 84.5  80.0 - 100.0 fL Final  . MCH 01/18/2016 28.5  26.0 - 34.0 pg Final  . MCHC 01/18/2016 33.7  32.0 - 36.0 g/dL Final  . RDW 01/18/2016 13.9  11.5 - 14.5 % Final  . Platelets 01/18/2016 174  150 - 440 K/uL Final  . Neutrophils Relative % 01/18/2016 67   Final  . Neutro Abs 01/18/2016  4.3  1.4 - 6.5 K/uL Final  . Lymphocytes Relative 01/18/2016 22   Final  . Lymphs Abs 01/18/2016 1.4  1.0 - 3.6 K/uL Final  . Monocytes Relative 01/18/2016 7   Final  . Monocytes Absolute 01/18/2016 0.5  0.2 - 0.9 K/uL Final  . Eosinophils Relative 01/18/2016 3   Final  . Eosinophils Absolute 01/18/2016 0.2  0 - 0.7 K/uL Final  . Basophils Relative 01/18/2016 1   Final  . Basophils Absolute 01/18/2016 0.1  0 - 0.1 K/uL Final  . Sodium 01/18/2016 137  135 - 145 mmol/L Final  . Potassium 01/18/2016 3.3* 3.5 - 5.1 mmol/L Final  . Chloride 01/18/2016 103  101 - 111 mmol/L Final  . CO2 01/18/2016 27  22 - 32 mmol/L Final  . Glucose, Bld 01/18/2016 134* 65 - 99 mg/dL Final  . BUN 01/18/2016 17  6 - 20 mg/dL Final  . Creatinine, Ser 01/18/2016 0.80  0.44 - 1.00 mg/dL Final  . Calcium 01/18/2016 9.4  8.9 - 10.3 mg/dL Final  . Total Protein 01/18/2016 7.2  6.5 - 8.1 g/dL Final  . Albumin 01/18/2016 3.8  3.5 - 5.0 g/dL Final  . AST 01/18/2016 29  15 - 41 U/L Final  . ALT 01/18/2016 23  14 - 54 U/L Final  . Alkaline Phosphatase 01/18/2016 55  38 - 126 U/L Final  . Total Bilirubin 01/18/2016 1.1  0.3 - 1.2 mg/dL Final  . GFR calc non Af Amer 01/18/2016 >60  >60 mL/min Final  . GFR calc Af Amer 01/18/2016 >60  >60 mL/min Final   Comment: (NOTE) The eGFR has been calculated using the CKD  EPI equation. This calculation has not been validated in all clinical situations. eGFR's persistently <60 mL/min signify possible Chronic Kidney Disease.   . Anion gap 01/18/2016 7  5 - 15 Final    STUDIES:  IMPRESSION: 1. New and enlarging bilateral pulmonary nodules. Nodules are currently too numerous to count. An index nodule in the left lower lobe is increased from 1.2 cm diameter to 1.8 cm diameter. 2. Centrilobular emphysema with airway thickening. 3. 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 4. Small right pleural effusion, likely malignant. 5. 1.5 by 1.8 cm hyperdense exophytic left kidney upper pole lesion, complex cyst versus mass. Not visibly hypermetabolic on prior PET-CT, and with similar hyperdensity on portal venous and delayed phase images.  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 On 12/01/14 - Right Pleural Fluid Cytology. POSITIVE FOR MALIGNANT CELLS. ADENOCARCINOMA, CONSISTENT WITH LUNG ORIGIN. Molecular testing for EGFR, ALK and others is negative  PLAN:   1. Lung Cancer: Pemetrexed was discontinued in April 2017 due to progression of disease on CT scan. Will initiate palliative immunotherapy using nivolumab today. Plan to reimage in approximately July 2017. Return to clinic in 2 weeks for lab and further evaluation for cycle 2. 2. 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 Repeat MRI from January 24th shows improvement with 2 or the 4 lesions cleared and the other two decreased in size.  3. Pain: Continue Norco as needed. 4. Kidney lesion: Unclear if metastatic deposit or second primary of the left kidney. Continue to monitor closely.  5. Anxiety: Resolved. Continue Xanax 1 mg at night and  0.5 mg as needed during  the day.     6. Diarrhea: Resolved. Monitor while on nivolumab.     7. Hypokalemia: Potassium 3.3 today. Increase potassium supplement to 20 meq BID.     Patient was seen and evaluated independently and I agree with the assessment and plan as dictated above.  Lloyd Huger, MD 01/20/2016 4:46 PM

## 2016-01-19 LAB — THYROID PANEL WITH TSH
Free Thyroxine Index: 2.5 (ref 1.2–4.9)
T3 UPTAKE RATIO: 30 % (ref 24–39)
T4 TOTAL: 8.4 ug/dL (ref 4.5–12.0)
TSH: 1.18 u[IU]/mL (ref 0.450–4.500)

## 2016-02-01 ENCOUNTER — Inpatient Hospital Stay: Payer: BLUE CROSS/BLUE SHIELD

## 2016-02-01 ENCOUNTER — Inpatient Hospital Stay (HOSPITAL_BASED_OUTPATIENT_CLINIC_OR_DEPARTMENT_OTHER): Payer: BLUE CROSS/BLUE SHIELD | Admitting: Oncology

## 2016-02-01 VITALS — BP 112/79 | HR 61 | Temp 96.1°F | Resp 18 | Wt 177.0 lb

## 2016-02-01 DIAGNOSIS — Z87891 Personal history of nicotine dependence: Secondary | ICD-10-CM

## 2016-02-01 DIAGNOSIS — C7931 Secondary malignant neoplasm of brain: Secondary | ICD-10-CM

## 2016-02-01 DIAGNOSIS — C7802 Secondary malignant neoplasm of left lung: Secondary | ICD-10-CM

## 2016-02-01 DIAGNOSIS — J432 Centrilobular emphysema: Secondary | ICD-10-CM

## 2016-02-01 DIAGNOSIS — N289 Disorder of kidney and ureter, unspecified: Secondary | ICD-10-CM

## 2016-02-01 DIAGNOSIS — C3491 Malignant neoplasm of unspecified part of right bronchus or lung: Secondary | ICD-10-CM

## 2016-02-01 DIAGNOSIS — R5383 Other fatigue: Secondary | ICD-10-CM

## 2016-02-01 DIAGNOSIS — J910 Malignant pleural effusion: Secondary | ICD-10-CM | POA: Diagnosis not present

## 2016-02-01 DIAGNOSIS — R531 Weakness: Secondary | ICD-10-CM

## 2016-02-01 DIAGNOSIS — I10 Essential (primary) hypertension: Secondary | ICD-10-CM

## 2016-02-01 DIAGNOSIS — Z79899 Other long term (current) drug therapy: Secondary | ICD-10-CM

## 2016-02-01 DIAGNOSIS — M25552 Pain in left hip: Secondary | ICD-10-CM

## 2016-02-01 LAB — COMPREHENSIVE METABOLIC PANEL
ALK PHOS: 72 U/L (ref 38–126)
ALT: 30 U/L (ref 14–54)
AST: 30 U/L (ref 15–41)
Albumin: 3.7 g/dL (ref 3.5–5.0)
Anion gap: 7 (ref 5–15)
BUN: 21 mg/dL — AB (ref 6–20)
CALCIUM: 9.1 mg/dL (ref 8.9–10.3)
CHLORIDE: 104 mmol/L (ref 101–111)
CO2: 26 mmol/L (ref 22–32)
CREATININE: 0.81 mg/dL (ref 0.44–1.00)
GFR calc Af Amer: >60 mL/min (ref >60)
Glucose, Bld: 106 mg/dL — ABNORMAL HIGH (ref 65–99)
Potassium: 3.8 mmol/L (ref 3.5–5.1)
Sodium: 137 mmol/L (ref 135–145)
Total Bilirubin: 0.7 mg/dL (ref 0.3–1.2)
Total Protein: 7.2 g/dL (ref 6.5–8.1)

## 2016-02-01 LAB — CBC WITH DIFFERENTIAL/PLATELET
BASOS ABS: 0 10*3/uL (ref 0–0.1)
Basophils Relative: 1 %
EOS PCT: 6 %
Eosinophils Absolute: 0.3 10*3/uL (ref 0–0.7)
HCT: 35.4 % (ref 35.0–47.0)
HEMOGLOBIN: 12 g/dL (ref 12.0–16.0)
LYMPHS ABS: 1.9 10*3/uL (ref 1.0–3.6)
LYMPHS PCT: 31 %
MCH: 28.3 pg (ref 26.0–34.0)
MCHC: 33.8 g/dL (ref 32.0–36.0)
MCV: 83.8 fL (ref 80.0–100.0)
Monocytes Absolute: 0.6 10*3/uL (ref 0.2–0.9)
Monocytes Relative: 9 %
NEUTROS ABS: 3.3 10*3/uL (ref 1.4–6.5)
NEUTROS PCT: 53 %
PLATELETS: 186 10*3/uL (ref 150–440)
RBC: 4.22 MIL/uL (ref 3.80–5.20)
RDW: 13.4 % (ref 11.5–14.5)
WBC: 6.1 10*3/uL (ref 3.6–11.0)

## 2016-02-01 MED ORDER — POTASSIUM CHLORIDE CRYS ER 20 MEQ PO TBCR: 20.0000 meq | EXTENDED_RELEASE_TABLET | Freq: Every day | ORAL | Status: DC

## 2016-02-01 MED ORDER — HEPARIN SOD (PORK) LOCK FLUSH 100 UNIT/ML IV SOLN
500.0000 [IU] | Freq: Once | INTRAVENOUS | Status: AC
  Administered 2016-02-01: 500 [IU] via INTRAVENOUS
  Filled 2016-02-01: qty 5

## 2016-02-01 MED ORDER — NIVOLUMAB CHEMO INJECTION 100 MG/10ML
240.0000 mg | Freq: Once | INTRAVENOUS | Status: AC
  Administered 2016-02-01: 240 mg via INTRAVENOUS
  Filled 2016-02-01: qty 8

## 2016-02-01 MED ORDER — SODIUM CHLORIDE 0.9 % IV SOLN
Freq: Once | INTRAVENOUS | Status: AC
  Administered 2016-02-01: 11:00:00 via INTRAVENOUS
  Filled 2016-02-01: qty 1000

## 2016-02-01 MED ORDER — SODIUM CHLORIDE 0.9% FLUSH
10.0000 mL | INTRAVENOUS | Status: DC | PRN
  Administered 2016-02-01: 10 mL via INTRAVENOUS
  Filled 2016-02-01: qty 10

## 2016-02-01 NOTE — Progress Notes (Signed)
If patient is to continue taking potassium 31mg BID she needs a new rx with the new directions.   Headaches have not improved and she is having occasional pain located in jaws that radiate to ears and temples.  Has a pain in middle of chest when taking a deep breath in.

## 2016-02-06 NOTE — Progress Notes (Signed)
Patricia Kelley  [phone]  Fax:(336) [place] DOB: 05-02-1956  MR#: [id number]  EPP#:[id number]  Patient Care Team: Marguerita Merles, MD as PCP - General (Family Medicine)  CHIEF COMPLAINT:  Chief Complaint  Patient presents with  . Lung Cancer    INTERVAL HISTORY: Patient returns for further evaluation and consideration of cycle 2 of nivolumab. She tolerated her first infusion well without significant side effects. She has mild weakness and fatigue. She does not complain of headache today. She denies any recent fevers. She has a good appetite and denies weight loss. She denies neurologic complaints. She denies any chest pain, hemoptysis, or shortness of breath. She denies any nausea, vomiting, diarrhea or constipation.  Patient offers no other specific complaints today.   REVIEW OF SYSTEMS:   Review of Systems  Constitutional: Positive for malaise/fatigue. Negative for fever and weight loss.  Respiratory: Negative.   Cardiovascular: Negative for leg swelling.  Gastrointestinal: Negative for diarrhea, blood in stool and melena.  Genitourinary: Negative.   Musculoskeletal:       Left hip pain  Skin: Negative.   Neurological: Positive for weakness. Negative for headaches.  Psychiatric/Behavioral: Negative.     As per HPI. Otherwise, a complete review of systems is negatve.   PAST MEDICAL HISTORY: Past Medical History  Diagnosis Date  . Lung cancer ([place])   . Hypertension   . Hypercholesteremia   . Metastatic lung cancer (metastasis from lung to other site) ([place]) 01/21/2015  . Anemia 01/21/2015  . 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 GERD (gastroesophageal reflux disease)   . Last menstrual period (LMP) > 10 days ago 2007    PAST SURGICAL HISTORY: Past Surgical History  Procedure Laterality Date  . Insertion / placement pleural catheter    . Portacath placement Right 04/26/2015    Procedure: INSERTION PORT-A-CATH;  Surgeon:  Nestor Lewandowsky, MD;  Location: [facility] ORS;  Service: General;  Laterality: Right;  . Abdominal hysterectomy      partial    FAMILY HISTORY Family History  Problem Relation Age of Onset  . Cervical cancer Mother   . Uterine cancer Maternal Grandmother     GYNECOLOGIC HISTORY:  No LMP recorded. Patient has had a hysterectomy.     ADVANCED DIRECTIVES:    HEALTH MAINTENANCE: Social History  Substance Use Topics  . Smoking status: Former Smoker -- 0.25 packs/day for 8 years    Types: Cigarettes    Quit date: 07/20/2014  . Smokeless tobacco: Not on file  . Alcohol Use: No    Allergies  Allergen Reactions  . Tegaderm Ag Mesh [Silver] Other (See Comments)    blisters    Current Outpatient Prescriptions  Medication Sig Dispense Refill  . ALPRAZolam (XANAX) 1 MG tablet Take 1 tablet (1 mg total) by mouth at bedtime as needed for anxiety. Take 1/2 tablet 1-2x per day as needed for anxiety and take 1 tablet at night for anxiety/sleeep (Patient taking differently: Take 1/2 tablet 1-2x per day as needed for anxiety and take 1 tablet at night for anxiety/sleeep) 45 tablet 0  . ferrous sulfate 325 (65 FE) MG tablet Take 325 mg by mouth daily with breakfast.    . furosemide (LASIX) 20 MG tablet Take 1 tablet daily as needed for swelling. 30 tablet 2  . HYDROcodone-acetaminophen (NORCO) 5-325 MG per tablet Take 1 tablet by mouth every 6 (six) hours as needed for moderate pain. 90 tablet 0  . lidocaine-prilocaine (  EMLA) cream Apply cream 1 hour before chemotherapy treatment (Patient not taking: Reported on 02/01/2016) 30 g 1  . lidocaine-prilocaine (EMLA) cream Apply to affected area once 30 g 3  . lisinopril-hydrochlorothiazide (PRINZIDE,ZESTORETIC) 10-12.5 MG per tablet Take 1 tablet by mouth daily.    . Multiple Vitamins-Minerals (MULTIVITAMIN WITH MINERALS) tablet Take 1 tablet by mouth daily. 30 tablet 3  . omeprazole (PRILOSEC) 20 MG capsule Take 20 mg by mouth every morning.     .  potassium chloride SA (K-DUR,KLOR-CON) 20 MEQ tablet Take 1 tablet (20 mEq total) by mouth daily. 90 tablet 1  . promethazine (PHENERGAN) 25 MG tablet Take 25 mg by mouth every 6 (six) hours as needed for nausea or vomiting.    . simvastatin (ZOCOR) 20 MG tablet Take 20 mg by mouth at bedtime.      No current facility-administered medications for this visit.   Facility-Administered Medications Ordered in Other Visits  Medication Dose Route Frequency Provider Last Rate Last Dose  . heparin lock flush 100 unit/mL  250 Units Intracatheter PRN Evlyn Kanner, NP   250 Units at 01/21/15 1734  . sodium chloride 0.9 % injection 10 mL  10 mL Intracatheter PRN Evlyn Kanner, NP   10 mL at 01/21/15 1734  . sodium chloride 0.9 % injection 10 mL  10 mL Intravenous PRN Leia Alf, MD   10 mL at 04/27/15 0905  . sodium chloride 0.9 % injection 3 mL  3 mL Intracatheter PRN Evlyn Kanner, NP   3 mL at 01/21/15 1731    OBJECTIVE: BP 112/79 mmHg  Pulse 61  Temp(Src) 96.1 F (35.6 C) (Tympanic)  Resp 18  Wt 177 lb 0.5 oz (80.3 kg)   Body mass index is 29.46 kg/(m^2).    ECOG FS:0 - Asymptomatic  General: Well-developed, well-nourished, no acute distress. Eyes: Pink conjunctiva, anicteric sclera. Lungs: Clear to auscultation bilaterally. Heart: Regular rate and rhythm. No rubs, murmurs, or gallops. Musculoskeletal: Mild non pitting edema bilateral ankles, No cyanosis, or clubbing. Neuro: Alert, answering all questions appropriately.  Skin: No rashes or petechiae noted. Psych: Normal affect.    LAB RESULTS:  Infusion on 02/01/2016  Component Date Value Ref Range Status  . WBC 02/01/2016 6.1  3.6 - 11.0 K/uL Final  . RBC 02/01/2016 4.22  3.80 - 5.20 MIL/uL Final  . Hemoglobin 02/01/2016 12.0  12.0 - 16.0 g/dL Final  . HCT 02/01/2016 35.4  35.0 - 47.0 % Final  . MCV 02/01/2016 83.8  80.0 - 100.0 fL Final  . MCH 02/01/2016 28.3  26.0 - 34.0 pg Final  . MCHC 02/01/2016 33.8  32.0 - 36.0  g/dL Final  . RDW 02/01/2016 13.4  11.5 - 14.5 % Final  . Platelets 02/01/2016 186  150 - 440 K/uL Final  . Neutrophils Relative % 02/01/2016 53   Final  . Neutro Abs 02/01/2016 3.3  1.4 - 6.5 K/uL Final  . Lymphocytes Relative 02/01/2016 31   Final  . Lymphs Abs 02/01/2016 1.9  1.0 - 3.6 K/uL Final  . Monocytes Relative 02/01/2016 9   Final  . Monocytes Absolute 02/01/2016 0.6  0.2 - 0.9 K/uL Final  . Eosinophils Relative 02/01/2016 6   Final  . Eosinophils Absolute 02/01/2016 0.3  0 - 0.7 K/uL Final  . Basophils Relative 02/01/2016 1   Final  . Basophils Absolute 02/01/2016 0.0  0 - 0.1 K/uL Final  . Sodium 02/01/2016 137  135 - 145 mmol/L Final  .  Potassium 02/01/2016 3.8  3.5 - 5.1 mmol/L Final  . Chloride 02/01/2016 104  101 - 111 mmol/L Final  . CO2 02/01/2016 26  22 - 32 mmol/L Final  . Glucose, Bld 02/01/2016 106* 65 - 99 mg/dL Final  . BUN 02/01/2016 21* 6 - 20 mg/dL Final  . Creatinine, Ser 02/01/2016 0.81  0.44 - 1.00 mg/dL Final  . Calcium 02/01/2016 9.1  8.9 - 10.3 mg/dL Final  . Total Protein 02/01/2016 7.2  6.5 - 8.1 g/dL Final  . Albumin 02/01/2016 3.7  3.5 - 5.0 g/dL Final  . AST 02/01/2016 30  15 - 41 U/L Final  . ALT 02/01/2016 30  14 - 54 U/L Final  . Alkaline Phosphatase 02/01/2016 72  38 - 126 U/L Final  . Total Bilirubin 02/01/2016 0.7  0.3 - 1.2 mg/dL Final  . GFR calc non Af Amer 02/01/2016 >60  >60 mL/min Final  . GFR calc Af Amer 02/01/2016 >60  >60 mL/min Final   Comment: (NOTE) The eGFR has been calculated using the CKD EPI equation. This calculation has not been validated in all clinical situations. eGFR's persistently <60 mL/min signify possible Chronic Kidney Disease.   . Anion gap 02/01/2016 7  5 - 15 Final    STUDIES:  IMPRESSION: 1. New and enlarging bilateral pulmonary nodules. Nodules are currently too numerous to count. An index nodule in the left lower lobe is increased from 1.2 cm diameter to 1.8 cm diameter. 2. Centrilobular  emphysema with airway thickening. 3. 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 4. Small right pleural effusion, likely malignant. 5. 1.5 by 1.8 cm hyperdense exophytic left kidney upper pole lesion, complex cyst versus mass. Not visibly hypermetabolic on prior PET-CT, and with similar hyperdensity on portal venous and delayed phase images.  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 On 12/01/14 - Right Pleural Fluid Cytology. POSITIVE FOR MALIGNANT CELLS. ADENOCARCINOMA, CONSISTENT WITH LUNG ORIGIN. Molecular testing for EGFR, ALK and others is negative  PLAN:   1. Lung Cancer: Pemetrexed was discontinued in April 2017 due to progression of disease on CT scan. Proceed with cycle 2 of nivolumab today. Plan to reimage in approximately July 2017. Return to clinic in 2 weeks for laboratory work, further evaluation, and consideration of cycle 3. 2. 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 Repeat MRI from January 24th shows improvement with 2 or the 4 lesions cleared and the other two decreased in size.  3. Pain: Continue Norco as needed. 4. Kidney lesion: Unclear if metastatic deposit or second primary of the left kidney. Continue to monitor closely.  5. Anxiety: Resolved. Continue Xanax 1 mg at night and 0.5 mg as needed during the day.     6. Diarrhea: Resolved. Monitor while on nivolumab.     7. Hypokalemia: Resolved. Continue oral potassium supplement to 20 meq BID.   Lloyd Huger, MD 02/06/2016 8:39 AM

## 2016-02-15 ENCOUNTER — Ambulatory Visit: Payer: BLUE CROSS/BLUE SHIELD | Admitting: Family Medicine

## 2016-02-15 ENCOUNTER — Other Ambulatory Visit: Payer: BLUE CROSS/BLUE SHIELD

## 2016-02-15 ENCOUNTER — Ambulatory Visit: Payer: BLUE CROSS/BLUE SHIELD

## 2016-02-15 ENCOUNTER — Inpatient Hospital Stay: Payer: BLUE CROSS/BLUE SHIELD

## 2016-02-15 ENCOUNTER — Inpatient Hospital Stay (HOSPITAL_BASED_OUTPATIENT_CLINIC_OR_DEPARTMENT_OTHER): Payer: BLUE CROSS/BLUE SHIELD | Admitting: Oncology

## 2016-02-15 VITALS — BP 131/83 | HR 64 | Temp 95.2°F | Resp 18 | Wt 179.2 lb

## 2016-02-15 DIAGNOSIS — M545 Low back pain: Secondary | ICD-10-CM

## 2016-02-15 DIAGNOSIS — R531 Weakness: Secondary | ICD-10-CM

## 2016-02-15 DIAGNOSIS — Z79899 Other long term (current) drug therapy: Secondary | ICD-10-CM

## 2016-02-15 DIAGNOSIS — J432 Centrilobular emphysema: Secondary | ICD-10-CM

## 2016-02-15 DIAGNOSIS — C3491 Malignant neoplasm of unspecified part of right bronchus or lung: Secondary | ICD-10-CM | POA: Diagnosis not present

## 2016-02-15 DIAGNOSIS — C7802 Secondary malignant neoplasm of left lung: Secondary | ICD-10-CM

## 2016-02-15 DIAGNOSIS — R5381 Other malaise: Secondary | ICD-10-CM

## 2016-02-15 DIAGNOSIS — C7931 Secondary malignant neoplasm of brain: Secondary | ICD-10-CM | POA: Diagnosis not present

## 2016-02-15 DIAGNOSIS — Z87891 Personal history of nicotine dependence: Secondary | ICD-10-CM

## 2016-02-15 DIAGNOSIS — R519 Headache, unspecified: Secondary | ICD-10-CM

## 2016-02-15 DIAGNOSIS — J910 Malignant pleural effusion: Secondary | ICD-10-CM

## 2016-02-15 DIAGNOSIS — R5383 Other fatigue: Secondary | ICD-10-CM

## 2016-02-15 DIAGNOSIS — M79605 Pain in left leg: Secondary | ICD-10-CM

## 2016-02-15 DIAGNOSIS — M549 Dorsalgia, unspecified: Secondary | ICD-10-CM

## 2016-02-15 DIAGNOSIS — R51 Headache: Secondary | ICD-10-CM

## 2016-02-15 DIAGNOSIS — N289 Disorder of kidney and ureter, unspecified: Secondary | ICD-10-CM

## 2016-02-15 LAB — CBC WITH DIFFERENTIAL/PLATELET
BASOS ABS: 0 10*3/uL (ref 0–0.1)
Basophils Relative: 1 %
EOS PCT: 4 %
Eosinophils Absolute: 0.3 10*3/uL (ref 0–0.7)
HCT: 35.7 % (ref 35.0–47.0)
HEMOGLOBIN: 12 g/dL (ref 12.0–16.0)
LYMPHS PCT: 29 %
Lymphs Abs: 1.9 10*3/uL (ref 1.0–3.6)
MCH: 28 pg (ref 26.0–34.0)
MCHC: 33.6 g/dL (ref 32.0–36.0)
MCV: 83.5 fL (ref 80.0–100.0)
Monocytes Absolute: 0.7 10*3/uL (ref 0.2–0.9)
Monocytes Relative: 10 %
NEUTROS ABS: 3.9 10*3/uL (ref 1.4–6.5)
NEUTROS PCT: 56 %
PLATELETS: 171 10*3/uL (ref 150–440)
RBC: 4.27 MIL/uL (ref 3.80–5.20)
RDW: 13.1 % (ref 11.5–14.5)
WBC: 6.8 10*3/uL (ref 3.6–11.0)

## 2016-02-15 LAB — COMPREHENSIVE METABOLIC PANEL
ALT: 26 U/L (ref 14–54)
AST: 26 U/L (ref 15–41)
Albumin: 3.7 g/dL (ref 3.5–5.0)
Alkaline Phosphatase: 71 U/L (ref 38–126)
Anion gap: 6 (ref 5–15)
BUN: 22 mg/dL — AB (ref 6–20)
CHLORIDE: 103 mmol/L (ref 101–111)
CO2: 28 mmol/L (ref 22–32)
CREATININE: 0.74 mg/dL (ref 0.44–1.00)
Calcium: 9.1 mg/dL (ref 8.9–10.3)
GFR calc Af Amer: >60 mL/min (ref >60)
Glucose, Bld: 104 mg/dL — ABNORMAL HIGH (ref 65–99)
Potassium: 3.3 mmol/L — ABNORMAL LOW (ref 3.5–5.1)
SODIUM: 137 mmol/L (ref 135–145)
Total Bilirubin: 0.8 mg/dL (ref 0.3–1.2)
Total Protein: 7 g/dL (ref 6.5–8.1)

## 2016-02-15 MED ORDER — SODIUM CHLORIDE 0.9 % IV SOLN
240.0000 mg | Freq: Once | INTRAVENOUS | Status: AC
  Administered 2016-02-15: 240 mg via INTRAVENOUS
  Filled 2016-02-15: qty 20

## 2016-02-15 MED ORDER — HEPARIN SOD (PORK) LOCK FLUSH 100 UNIT/ML IV SOLN
500.0000 [IU] | Freq: Once | INTRAVENOUS | Status: AC | PRN
  Administered 2016-02-15: 500 [IU]
  Filled 2016-02-15: qty 5

## 2016-02-15 MED ORDER — ALPRAZOLAM 1 MG PO TABS: ORAL_TABLET | ORAL | Status: DC

## 2016-02-15 MED ORDER — SODIUM CHLORIDE 0.9 % IV SOLN
Freq: Once | INTRAVENOUS | Status: AC
  Administered 2016-02-15: 12:00:00 via INTRAVENOUS
  Filled 2016-02-15: qty 1000

## 2016-02-15 NOTE — Progress Notes (Signed)
Patient is having an increase in headaches that are worse in the morning.  She is taking a Claritin for the fluid feeling in her right ear and it does seem to help.

## 2016-02-16 LAB — THYROID PANEL WITH TSH
FREE THYROXINE INDEX: 1.8 (ref 1.2–4.9)
T3 UPTAKE RATIO: 27 % (ref 24–39)
T4 TOTAL: 6.8 ug/dL (ref 4.5–12.0)
TSH: 1.03 u[IU]/mL (ref 0.450–4.500)

## 2016-02-16 NOTE — Progress Notes (Signed)
[place]  [phone]  Fax:(336) [place] DOB: 04-03-1956  MR#: [id number]  RCB#:[id number]  Patient Care Team: Marguerita Merles, MD as PCP - General (Family Medicine)  CHIEF COMPLAINT:  Chief Complaint  Patient presents with  . Lung Cancer    INTERVAL HISTORY: Patient returns for further evaluation and consideration of cycle 3 of nivolumab. She Is having increased act and left leg pain as well as persistent headaches. She otherwise feels well and is tolerating her treatments. She denies any recent fevers. She has a good appetite and denies weight loss. She denies neurologic complaints. She denies any chest pain, hemoptysis, or shortness of breath. She denies any nausea, vomiting, diarrhea or constipation.  Patient offers no other specific complaints today.   REVIEW OF SYSTEMS:   Review of Systems  Constitutional: Positive for malaise/fatigue. Negative for fever and weight loss.  Respiratory: Negative.   Cardiovascular: Negative for leg swelling.  Gastrointestinal: Negative for diarrhea, blood in stool and melena.  Genitourinary: Negative.   Musculoskeletal: Positive for back pain.       Left hip pain  Skin: Negative.   Neurological: Positive for weakness and headaches.  Psychiatric/Behavioral: Negative.     As per HPI. Otherwise, a complete review of systems is negatve.   PAST MEDICAL HISTORY: Past Medical History  Diagnosis Date  . Lung cancer ([place])   . Hypertension   . Hypercholesteremia   . Metastatic lung cancer (metastasis from lung to other site) ([place]) 01/21/2015  . Anemia 01/21/2015  . 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 GERD (gastroesophageal reflux disease)   . Last menstrual period (LMP) > 10 days ago 2007    PAST SURGICAL HISTORY: Past Surgical History  Procedure Laterality Date  . Insertion / placement pleural catheter    . Portacath placement Right 04/26/2015    Procedure: INSERTION PORT-A-CATH;   Surgeon: Nestor Lewandowsky, MD;  Location: [facility] ORS;  Service: General;  Laterality: Right;  . Abdominal hysterectomy      partial    FAMILY HISTORY Family History  Problem Relation Age of Onset  . Cervical cancer Mother   . Uterine cancer Maternal Grandmother     GYNECOLOGIC HISTORY:  No LMP recorded. Patient has had a hysterectomy.     ADVANCED DIRECTIVES:    HEALTH MAINTENANCE: Social History  Substance Use Topics  . Smoking status: Former Smoker -- 0.25 packs/day for 8 years    Types: Cigarettes    Quit date: 07/20/2014  . Smokeless tobacco: Not on file  . Alcohol Use: No    Allergies  Allergen Reactions  . Tegaderm Ag Mesh [Silver] Other (See Comments)    blisters    Current Outpatient Prescriptions  Medication Sig Dispense Refill  . ALPRAZolam (XANAX) 1 MG tablet Take 1/2 tablet 1-2x per day as needed for anxiety and take 1 tablet at night for anxiety/sleeep 45 tablet 0  . ferrous sulfate 325 (65 FE) MG tablet Take 325 mg by mouth daily with breakfast.    . furosemide (LASIX) 20 MG tablet Take 1 tablet daily as needed for swelling. 30 tablet 2  . HYDROcodone-acetaminophen (NORCO) 5-325 MG per tablet Take 1 tablet by mouth every 6 (six) hours as needed for moderate pain. 90 tablet 0  . lidocaine-prilocaine (EMLA) cream Apply cream 1 hour before chemotherapy treatment 30 g 1  . lidocaine-prilocaine (EMLA) cream Apply to affected area once 30 g 3  . lisinopril-hydrochlorothiazide (PRINZIDE,ZESTORETIC) 10-12.5 MG per  tablet Take 1 tablet by mouth daily.    . Multiple Vitamins-Minerals (MULTIVITAMIN WITH MINERALS) tablet Take 1 tablet by mouth daily. 30 tablet 3  . omeprazole (PRILOSEC) 20 MG capsule Take 20 mg by mouth every morning.     . potassium chloride SA (K-DUR,KLOR-CON) 20 MEQ tablet Take 1 tablet (20 mEq total) by mouth daily. 90 tablet 1  . promethazine (PHENERGAN) 25 MG tablet Take 25 mg by mouth every 6 (six) hours as needed for nausea or vomiting.    .  simvastatin (ZOCOR) 20 MG tablet Take 20 mg by mouth at bedtime.      No current facility-administered medications for this visit.   Facility-Administered Medications Ordered in Other Visits  Medication Dose Route Frequency Provider Last Rate Last Dose  . heparin lock flush 100 unit/mL  250 Units Intracatheter PRN Evlyn Kanner, NP   250 Units at 01/21/15 1734  . sodium chloride 0.9 % injection 10 mL  10 mL Intracatheter PRN Evlyn Kanner, NP   10 mL at 01/21/15 1734  . sodium chloride 0.9 % injection 10 mL  10 mL Intravenous PRN Leia Alf, MD   10 mL at 04/27/15 0905  . sodium chloride 0.9 % injection 3 mL  3 mL Intracatheter PRN Evlyn Kanner, NP   3 mL at 01/21/15 1731    OBJECTIVE: BP 131/83 mmHg  Pulse 64  Temp(Src) 95.2 F (35.1 C) (Tympanic)  Resp 18  Wt 179 lb 3.7 oz (81.3 kg)   Body mass index is 29.83 kg/(m^2).    ECOG FS:0 - Asymptomatic  General: Well-developed, well-nourished, no acute distress. Eyes: Pink conjunctiva, anicteric sclera. Lungs: Clear to auscultation bilaterally. Heart: Regular rate and rhythm. No rubs, murmurs, or gallops. Musculoskeletal: Mild non pitting edema bilateral ankles, No cyanosis, or clubbing. Neuro: Alert, answering all questions appropriately.  Skin: No rashes or petechiae noted. Psych: Normal affect.    LAB RESULTS:  Clinical Support on 02/15/2016  Component Date Value Ref Range Status  . TSH 02/15/2016 1.030  0.450 - 4.500 uIU/mL Final  . T4, Total 02/15/2016 6.8  4.5 - 12.0 ug/dL Final  . T3 Uptake Ratio 02/15/2016 27  24 - 39 % Final  . Free Thyroxine Index 02/15/2016 1.8  1.2 - 4.9 Final   Comment: (NOTE) Performed At: [facility] [place], [place] [id number] Lindon Romp MD VH:[id number]   . WBC 02/15/2016 6.8  3.6 - 11.0 K/uL Final  . RBC 02/15/2016 4.27  3.80 - 5.20 MIL/uL Final  . Hemoglobin 02/15/2016 12.0  12.0 - 16.0 g/dL Final  . HCT 02/15/2016 35.7  35.0 - 47.0 % Final  .  MCV 02/15/2016 83.5  80.0 - 100.0 fL Final  . MCH 02/15/2016 28.0  26.0 - 34.0 pg Final  . MCHC 02/15/2016 33.6  32.0 - 36.0 g/dL Final  . RDW 02/15/2016 13.1  11.5 - 14.5 % Final  . Platelets 02/15/2016 171  150 - 440 K/uL Final  . Neutrophils Relative % 02/15/2016 56   Final  . Neutro Abs 02/15/2016 3.9  1.4 - 6.5 K/uL Final  . Lymphocytes Relative 02/15/2016 29   Final  . Lymphs Abs 02/15/2016 1.9  1.0 - 3.6 K/uL Final  . Monocytes Relative 02/15/2016 10   Final  . Monocytes Absolute 02/15/2016 0.7  0.2 - 0.9 K/uL Final  . Eosinophils Relative 02/15/2016 4   Final  . Eosinophils Absolute 02/15/2016 0.3  0 - 0.7 K/uL Final  .  Basophils Relative 02/15/2016 1   Final  . Basophils Absolute 02/15/2016 0.0  0 - 0.1 K/uL Final  . Sodium 02/15/2016 137  135 - 145 mmol/L Final  . Potassium 02/15/2016 3.3* 3.5 - 5.1 mmol/L Final  . Chloride 02/15/2016 103  101 - 111 mmol/L Final  . CO2 02/15/2016 28  22 - 32 mmol/L Final  . Glucose, Bld 02/15/2016 104* 65 - 99 mg/dL Final  . BUN 02/15/2016 22* 6 - 20 mg/dL Final  . Creatinine, Ser 02/15/2016 0.74  0.44 - 1.00 mg/dL Final  . Calcium 02/15/2016 9.1  8.9 - 10.3 mg/dL Final  . Total Protein 02/15/2016 7.0  6.5 - 8.1 g/dL Final  . Albumin 02/15/2016 3.7  3.5 - 5.0 g/dL Final  . AST 02/15/2016 26  15 - 41 U/L Final  . ALT 02/15/2016 26  14 - 54 U/L Final  . Alkaline Phosphatase 02/15/2016 71  38 - 126 U/L Final  . Total Bilirubin 02/15/2016 0.8  0.3 - 1.2 mg/dL Final  . GFR calc non Af Amer 02/15/2016 >60  >60 mL/min Final  . GFR calc Af Amer 02/15/2016 >60  >60 mL/min Final   Comment: (NOTE) The eGFR has been calculated using the CKD EPI equation. This calculation has not been validated in all clinical situations. eGFR's persistently <60 mL/min signify possible Chronic Kidney Disease.   . Anion gap 02/15/2016 6  5 - 15 Final    STUDIES:  IMPRESSION: 1. New and enlarging bilateral pulmonary nodules. Nodules are currently too numerous  to count. An index nodule in the left lower lobe is increased from 1.2 cm diameter to 1.8 cm diameter. 2. Centrilobular emphysema with airway thickening. 3. 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 4. Small right pleural effusion, likely malignant. 5. 1.5 by 1.8 cm hyperdense exophytic left kidney upper pole lesion, complex cyst versus mass. Not visibly hypermetabolic on prior PET-CT, and with similar hyperdensity on portal venous and delayed phase images.  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 On 12/01/14 - Right Pleural Fluid Cytology. POSITIVE FOR MALIGNANT CELLS. ADENOCARCINOMA, CONSISTENT WITH LUNG ORIGIN. Molecular testing for EGFR, ALK and others is negative  PLAN:   1. Lung Cancer: Pemetrexed was discontinued in April 2017 due to progression of disease on CT scan. Proceed with cycle 3 of nivolumab today. Plan to reimage in approximately July 2017. Return to clinic in 2 weeks for laboratory work, further evaluation, and consideration of cycle 4. 2. 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 Repeat MRI from January 24th shows improvement with 2 or the 4 lesions cleared and the other two decreased in size. Because of patient's persistent headaches, will repeat MRI in the next 1-2 weeks. 3. Pain: We will get MRI the lumbar spine to further evaluate.  Continue Norco as needed. 4. Kidney lesion: Unclear if metastatic deposit or second primary of the left kidney. Continue to monitor closely.  5. Anxiety: Resolved. Continue Xanax 1 mg at night and 0.5 mg as needed during the day.     6. Diarrhea: Resolved. Monitor while on nivolumab.     7. Hypokalemia: Mild. Continue oral potassium supplement to 20 meq  BID.   Lloyd Huger, MD 02/16/2016 11:50 AM

## 2016-02-25 ENCOUNTER — Inpatient Hospital Stay (HOSPITAL_BASED_OUTPATIENT_CLINIC_OR_DEPARTMENT_OTHER): Payer: BLUE CROSS/BLUE SHIELD | Admitting: Oncology

## 2016-02-25 ENCOUNTER — Inpatient Hospital Stay: Payer: BLUE CROSS/BLUE SHIELD | Attending: Oncology

## 2016-02-25 ENCOUNTER — Other Ambulatory Visit: Payer: Self-pay | Admitting: Oncology

## 2016-02-25 VITALS — BP 137/83 | HR 71 | Temp 96.1°F | Resp 18 | Wt 179.9 lb

## 2016-02-25 DIAGNOSIS — R5381 Other malaise: Secondary | ICD-10-CM | POA: Insufficient documentation

## 2016-02-25 DIAGNOSIS — R5383 Other fatigue: Secondary | ICD-10-CM | POA: Insufficient documentation

## 2016-02-25 DIAGNOSIS — R531 Weakness: Secondary | ICD-10-CM | POA: Insufficient documentation

## 2016-02-25 DIAGNOSIS — C7931 Secondary malignant neoplasm of brain: Secondary | ICD-10-CM | POA: Insufficient documentation

## 2016-02-25 DIAGNOSIS — M79602 Pain in left arm: Secondary | ICD-10-CM

## 2016-02-25 DIAGNOSIS — Z79899 Other long term (current) drug therapy: Secondary | ICD-10-CM | POA: Insufficient documentation

## 2016-02-25 DIAGNOSIS — M549 Dorsalgia, unspecified: Secondary | ICD-10-CM | POA: Insufficient documentation

## 2016-02-25 DIAGNOSIS — C3491 Malignant neoplasm of unspecified part of right bronchus or lung: Secondary | ICD-10-CM | POA: Insufficient documentation

## 2016-02-25 DIAGNOSIS — N289 Disorder of kidney and ureter, unspecified: Secondary | ICD-10-CM | POA: Diagnosis not present

## 2016-02-25 DIAGNOSIS — M25552 Pain in left hip: Secondary | ICD-10-CM

## 2016-02-25 DIAGNOSIS — Z87891 Personal history of nicotine dependence: Secondary | ICD-10-CM | POA: Diagnosis not present

## 2016-02-25 DIAGNOSIS — C7802 Secondary malignant neoplasm of left lung: Secondary | ICD-10-CM

## 2016-02-25 DIAGNOSIS — E876 Hypokalemia: Secondary | ICD-10-CM | POA: Diagnosis not present

## 2016-02-25 DIAGNOSIS — E7800 Pure hypercholesterolemia, unspecified: Secondary | ICD-10-CM | POA: Insufficient documentation

## 2016-02-25 DIAGNOSIS — J432 Centrilobular emphysema: Secondary | ICD-10-CM

## 2016-02-25 DIAGNOSIS — K219 Gastro-esophageal reflux disease without esophagitis: Secondary | ICD-10-CM | POA: Diagnosis not present

## 2016-02-25 DIAGNOSIS — I10 Essential (primary) hypertension: Secondary | ICD-10-CM | POA: Diagnosis not present

## 2016-02-25 DIAGNOSIS — J90 Pleural effusion, not elsewhere classified: Secondary | ICD-10-CM | POA: Diagnosis not present

## 2016-02-25 DIAGNOSIS — Z5112 Encounter for antineoplastic immunotherapy: Secondary | ICD-10-CM | POA: Diagnosis not present

## 2016-02-25 DIAGNOSIS — R51 Headache: Secondary | ICD-10-CM | POA: Diagnosis not present

## 2016-02-25 DIAGNOSIS — J910 Malignant pleural effusion: Secondary | ICD-10-CM | POA: Insufficient documentation

## 2016-02-25 LAB — CBC WITH DIFFERENTIAL/PLATELET
BASOS ABS: 0 10*3/uL (ref 0–0.1)
Basophils Relative: 1 %
EOS PCT: 5 %
Eosinophils Absolute: 0.3 10*3/uL (ref 0–0.7)
HEMATOCRIT: 37.8 % (ref 35.0–47.0)
Hemoglobin: 12.6 g/dL (ref 12.0–16.0)
LYMPHS PCT: 31 %
Lymphs Abs: 2 10*3/uL (ref 1.0–3.6)
MCH: 27.8 pg (ref 26.0–34.0)
MCHC: 33.4 g/dL (ref 32.0–36.0)
MCV: 83.3 fL (ref 80.0–100.0)
Monocytes Absolute: 0.6 10*3/uL (ref 0.2–0.9)
Monocytes Relative: 10 %
NEUTROS ABS: 3.3 10*3/uL (ref 1.4–6.5)
Neutrophils Relative %: 53 %
PLATELETS: 179 10*3/uL (ref 150–440)
RBC: 4.54 MIL/uL (ref 3.80–5.20)
RDW: 13.4 % (ref 11.5–14.5)
WBC: 6.3 10*3/uL (ref 3.6–11.0)

## 2016-02-25 LAB — COMPREHENSIVE METABOLIC PANEL
ALBUMIN: 3.9 g/dL (ref 3.5–5.0)
ALT: 27 U/L (ref 14–54)
AST: 28 U/L (ref 15–41)
Alkaline Phosphatase: 67 U/L (ref 38–126)
Anion gap: 6 (ref 5–15)
BUN: 18 mg/dL (ref 6–20)
CHLORIDE: 103 mmol/L (ref 101–111)
CO2: 30 mmol/L (ref 22–32)
CREATININE: 0.82 mg/dL (ref 0.44–1.00)
Calcium: 9.7 mg/dL (ref 8.9–10.3)
GFR calc Af Amer: >60 mL/min (ref >60)
Glucose, Bld: 102 mg/dL — ABNORMAL HIGH (ref 65–99)
POTASSIUM: 3.9 mmol/L (ref 3.5–5.1)
Sodium: 139 mmol/L (ref 135–145)
TOTAL PROTEIN: 7.3 g/dL (ref 6.5–8.1)
Total Bilirubin: 0.9 mg/dL (ref 0.3–1.2)

## 2016-02-25 NOTE — Progress Notes (Signed)
Patient states she is having pain in her left arm that causes numbness and tingling in her thumb and 1st few fingers.  States it comes and goes, especially if she puts any pressure on her arm.

## 2016-02-25 NOTE — Progress Notes (Signed)
[place]  Telephone:(336) [phone]  Fax:(336) [place] DOB: 07-09-1956  MR#: [id number]  BTD#:[id number]  Patient Care Team: Marguerita Merles, MD as PCP - General (Family Medicine)  CHIEF COMPLAINT:  Chief Complaint  Patient presents with  . Lung Cancer    INTERVAL HISTORY: Patient returns to clinic today for complaints of left arm pain and numbness.She has also still having left back/hip pain and headaches as well. She has MRI lumbar spine and brain scheduled for June 16th. She states she is having pain in her left arm that causes numbness and tingling in her thumb and 1st few fingers. States it comes and goes. It hurts only when she puts pressure on it by leaning on it and then the numbness goes to her fingers/thumb. It also bothers her when she lifts weights with it at exercise class which she has stopped using her left arm at class. This has been going on for about 2 weeks. She notices when she leans over and her back really hurts and then she sits up, that is when she notices her thumb going numb. She otherwise feels well and is tolerating her treatments. She denies any recent fevers. She has a good appetite and denies weight loss. She denies any chest pain or tightness, hemoptysis, or shortness of breath. She denies any nausea, vomiting, diarrhea or constipation.  Patient offers no other specific complaints today.   REVIEW OF SYSTEMS:   Review of Systems  Constitutional: Positive for malaise/fatigue. Negative for fever, weight loss and diaphoresis.  Eyes: Negative.   Respiratory: Negative.   Cardiovascular: Negative for chest pain, palpitations and leg swelling.  Gastrointestinal: Negative for nausea, abdominal pain, diarrhea, blood in stool and melena.  Genitourinary: Negative.   Musculoskeletal: Positive for back pain.       Left hip pain, left arm pain  Skin: Negative.   Neurological: Positive for weakness and headaches.    Psychiatric/Behavioral: Negative.     As per HPI. Otherwise, a complete review of systems is negatve.   PAST MEDICAL HISTORY: Past Medical History  Diagnosis Date  . Lung cancer ([place])   . Hypertension   . Hypercholesteremia   . Metastatic lung cancer (metastasis from lung to other site) ([place]) 01/21/2015  . Anemia 01/21/2015  . 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 GERD (gastroesophageal reflux disease)   . Last menstrual period (LMP) > 10 days ago 2007    PAST SURGICAL HISTORY: Past Surgical History  Procedure Laterality Date  . Insertion / placement pleural catheter    . Portacath placement Right 04/26/2015    Procedure: INSERTION PORT-A-CATH;  Surgeon: Nestor Lewandowsky, MD;  Location: [facility] ORS;  Service: General;  Laterality: Right;  . Abdominal hysterectomy      partial    FAMILY HISTORY Family History  Problem Relation Age of Onset  . Cervical cancer Mother   . Uterine cancer Maternal Grandmother     GYNECOLOGIC HISTORY:  No LMP recorded. Patient has had a hysterectomy.     ADVANCED DIRECTIVES:    HEALTH MAINTENANCE: Social History  Substance Use Topics  . Smoking status: Former Smoker -- 0.25 packs/day for 8 years    Types: Cigarettes    Quit date: 07/20/2014  . Smokeless tobacco: Not on file  . Alcohol Use: No    Allergies  Allergen Reactions  . Tegaderm Ag Mesh [Silver] Other (See Comments)    blisters    Current Outpatient Prescriptions  Medication  Sig Dispense Refill  . ALPRAZolam (XANAX) 1 MG tablet Take 1/2 tablet 1-2x per day as needed for anxiety and take 1 tablet at night for anxiety/sleeep 45 tablet 0  . ferrous sulfate 325 (65 FE) MG tablet Take 325 mg by mouth daily with breakfast.    . furosemide (LASIX) 20 MG tablet Take 1 tablet daily as needed for swelling. 30 tablet 2  . HYDROcodone-acetaminophen (NORCO) 5-325 MG per tablet Take 1 tablet by mouth every 6 (six) hours as needed for moderate pain. 90 tablet 0  . lidocaine-prilocaine (EMLA)  cream Apply cream 1 hour before chemotherapy treatment 30 g 1  . lidocaine-prilocaine (EMLA) cream Apply to affected area once 30 g 3  . lisinopril-hydrochlorothiazide (PRINZIDE,ZESTORETIC) 10-12.5 MG per tablet Take 1 tablet by mouth daily.    . Multiple Vitamins-Minerals (MULTIVITAMIN WITH MINERALS) tablet Take 1 tablet by mouth daily. 30 tablet 3  . omeprazole (PRILOSEC) 20 MG capsule Take 20 mg by mouth every morning.     . potassium chloride SA (K-DUR,KLOR-CON) 20 MEQ tablet Take 1 tablet (20 mEq total) by mouth daily. 90 tablet 1  . promethazine (PHENERGAN) 25 MG tablet Take 25 mg by mouth every 6 (six) hours as needed for nausea or vomiting.    . simvastatin (ZOCOR) 20 MG tablet Take 20 mg by mouth at bedtime.     [name] oxyCODONE-acetaminophen (PERCOCET/ROXICET) 5-325 MG tablet Take by mouth.     No current facility-administered medications for this visit.   Facility-Administered Medications Ordered in Other Visits  Medication Dose Route Frequency Provider Last Rate Last Dose  . heparin lock flush 100 unit/mL  250 Units Intracatheter PRN Evlyn Kanner, NP   250 Units at 01/21/15 1734  . sodium chloride 0.9 % injection 10 mL  10 mL Intracatheter PRN Evlyn Kanner, NP   10 mL at 01/21/15 1734  . sodium chloride 0.9 % injection 10 mL  10 mL Intravenous PRN Leia Alf, MD   10 mL at 04/27/15 0905  . sodium chloride 0.9 % injection 3 mL  3 mL Intracatheter PRN Evlyn Kanner, NP   3 mL at 01/21/15 1731    OBJECTIVE: BP 137/83 mmHg  Pulse 71  Temp(Src) 96.1 F (35.6 C) (Tympanic)  Resp 18  Wt 179 lb 14.3 oz (81.6 kg)   Body mass index is 29.94 kg/(m^2).    ECOG FS:0 - Asymptomatic  General: Well-developed, well-nourished, no acute distress. Eyes: Pink conjunctiva, anicteric sclera. Lungs: Clear to auscultation bilaterally. Heart: Regular rate and rhythm. No rubs, murmurs, or gallops. Musculoskeletal: No edemal, No cyanosis, or clubbing.  Neuro: Alert, answering all questions  appropriately.  Skin: No rashes or petechiae noted. Psych: Normal affect.    LAB RESULTS:  Appointment on 02/25/2016  Component Date Value Ref Range Status  . WBC 02/25/2016 6.3  3.6 - 11.0 K/uL Final  . RBC 02/25/2016 4.54  3.80 - 5.20 MIL/uL Final  . Hemoglobin 02/25/2016 12.6  12.0 - 16.0 g/dL Final  . HCT 02/25/2016 37.8  35.0 - 47.0 % Final  . MCV 02/25/2016 83.3  80.0 - 100.0 fL Final  . MCH 02/25/2016 27.8  26.0 - 34.0 pg Final  . MCHC 02/25/2016 33.4  32.0 - 36.0 g/dL Final  . RDW 02/25/2016 13.4  11.5 - 14.5 % Final  . Platelets 02/25/2016 179  150 - 440 K/uL Final  . Neutrophils Relative % 02/25/2016 53   Final  . Neutro Abs 02/25/2016 3.3  1.4 - 6.5  K/uL Final  . Lymphocytes Relative 02/25/2016 31   Final  . Lymphs Abs 02/25/2016 2.0  1.0 - 3.6 K/uL Final  . Monocytes Relative 02/25/2016 10   Final  . Monocytes Absolute 02/25/2016 0.6  0.2 - 0.9 K/uL Final  . Eosinophils Relative 02/25/2016 5   Final  . Eosinophils Absolute 02/25/2016 0.3  0 - 0.7 K/uL Final  . Basophils Relative 02/25/2016 1   Final  . Basophils Absolute 02/25/2016 0.0  0 - 0.1 K/uL Final  . Sodium 02/25/2016 139  135 - 145 mmol/L Final  . Potassium 02/25/2016 3.9  3.5 - 5.1 mmol/L Final  . Chloride 02/25/2016 103  101 - 111 mmol/L Final  . CO2 02/25/2016 30  22 - 32 mmol/L Final  . Glucose, Bld 02/25/2016 102* 65 - 99 mg/dL Final  . BUN 02/25/2016 18  6 - 20 mg/dL Final  . Creatinine, Ser 02/25/2016 0.82  0.44 - 1.00 mg/dL Final  . Calcium 02/25/2016 9.7  8.9 - 10.3 mg/dL Final  . Total Protein 02/25/2016 7.3  6.5 - 8.1 g/dL Final  . Albumin 02/25/2016 3.9  3.5 - 5.0 g/dL Final  . AST 02/25/2016 28  15 - 41 U/L Final  . ALT 02/25/2016 27  14 - 54 U/L Final  . Alkaline Phosphatase 02/25/2016 67  38 - 126 U/L Final  . Total Bilirubin 02/25/2016 0.9  0.3 - 1.2 mg/dL Final  . GFR calc non Af Amer 02/25/2016 >60  >60 mL/min Final  . GFR calc Af Amer 02/25/2016 >60  >60 mL/min Final   Comment:  (NOTE) The eGFR has been calculated using the CKD EPI equation. This calculation has not been validated in all clinical situations. eGFR's persistently <60 mL/min signify possible Chronic Kidney Disease.   . Anion gap 02/25/2016 6  5 - 15 Final    STUDIES:  IMPRESSION: 1. New and enlarging bilateral pulmonary nodules. Nodules are currently too numerous to count. An index nodule in the left lower lobe is increased from 1.2 cm diameter to 1.8 cm diameter. 2. Centrilobular emphysema with airway thickening. 3. 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 4. Small right pleural effusion, likely malignant. 5. 1.5 by 1.8 cm hyperdense exophytic left kidney upper pole lesion, complex cyst versus mass. Not visibly hypermetabolic on prior PET-CT, and with similar hyperdensity on portal venous and delayed phase images.  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 On 12/01/14 - Right Pleural Fluid Cytology. POSITIVE FOR MALIGNANT CELLS. ADENOCARCINOMA, CONSISTENT WITH LUNG ORIGIN. Molecular testing for EGFR, ALK and others is negative  PLAN:   1. Lung Cancer: Pemetrexed was discontinued in April 2017 due to progression of disease on CT scan.  Plan to reimage in approximately July 2017. Return to clinic in one week for laboratory work, further evaluation, and consideration of cycle 4. 2. 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 Repeat MRI from January 24th shows improvement with 2 or the 4 lesions cleared and the other two decreased in size. Because of patient's persistent headaches, MRI scheduled for June 16th 3. Pain (back,hip, arm): MRI the lumbar spine scheduled June 16th.  Continue Norco as needed. 4. Kidney lesion: Unclear  if metastatic deposit or second primary of the left kidney. Continue to monitor closely.  5. Anxiety: Resolved. Continue  Xanax 1 mg at night and 0.5 mg as needed during the day.     6. Diarrhea: Resolved. Monitor while on nivolumab.     7. Hypokalemia: WNL today. Continue oral potassium supplement to 20 meq BID.     8. Patient has been advised to call or come to the ER in case of any worsening or new symptoms. She is agreeable to this plan  Dr. Grayland Ormond was available for consultation and review of plan of care for this patient.  Mayra Reel, NP 02/25/2016 9:58 AM

## 2016-02-29 ENCOUNTER — Ambulatory Visit: Payer: BLUE CROSS/BLUE SHIELD | Admitting: Oncology

## 2016-02-29 ENCOUNTER — Other Ambulatory Visit: Payer: BLUE CROSS/BLUE SHIELD

## 2016-02-29 ENCOUNTER — Ambulatory Visit: Payer: BLUE CROSS/BLUE SHIELD

## 2016-02-29 ENCOUNTER — Telehealth: Payer: Self-pay | Admitting: *Deleted

## 2016-02-29 ENCOUNTER — Inpatient Hospital Stay: Payer: BLUE CROSS/BLUE SHIELD

## 2016-02-29 ENCOUNTER — Inpatient Hospital Stay (HOSPITAL_BASED_OUTPATIENT_CLINIC_OR_DEPARTMENT_OTHER): Payer: BLUE CROSS/BLUE SHIELD | Admitting: Oncology

## 2016-02-29 ENCOUNTER — Encounter: Payer: Self-pay | Admitting: Oncology

## 2016-02-29 VITALS — BP 122/79 | HR 75 | Temp 97.9°F | Wt 177.0 lb

## 2016-02-29 DIAGNOSIS — M25552 Pain in left hip: Secondary | ICD-10-CM

## 2016-02-29 DIAGNOSIS — C7802 Secondary malignant neoplasm of left lung: Secondary | ICD-10-CM | POA: Diagnosis not present

## 2016-02-29 DIAGNOSIS — R5381 Other malaise: Secondary | ICD-10-CM

## 2016-02-29 DIAGNOSIS — E876 Hypokalemia: Secondary | ICD-10-CM

## 2016-02-29 DIAGNOSIS — R531 Weakness: Secondary | ICD-10-CM

## 2016-02-29 DIAGNOSIS — R5383 Other fatigue: Secondary | ICD-10-CM

## 2016-02-29 DIAGNOSIS — J910 Malignant pleural effusion: Secondary | ICD-10-CM | POA: Diagnosis not present

## 2016-02-29 DIAGNOSIS — M79602 Pain in left arm: Secondary | ICD-10-CM

## 2016-02-29 DIAGNOSIS — C7931 Secondary malignant neoplasm of brain: Secondary | ICD-10-CM

## 2016-02-29 DIAGNOSIS — C3491 Malignant neoplasm of unspecified part of right bronchus or lung: Secondary | ICD-10-CM

## 2016-02-29 DIAGNOSIS — M549 Dorsalgia, unspecified: Secondary | ICD-10-CM

## 2016-02-29 DIAGNOSIS — N289 Disorder of kidney and ureter, unspecified: Secondary | ICD-10-CM

## 2016-02-29 DIAGNOSIS — R51 Headache: Secondary | ICD-10-CM

## 2016-02-29 DIAGNOSIS — Z87891 Personal history of nicotine dependence: Secondary | ICD-10-CM

## 2016-02-29 LAB — COMPREHENSIVE METABOLIC PANEL
ALK PHOS: 65 U/L (ref 38–126)
ALT: 22 U/L (ref 14–54)
ANION GAP: 7 (ref 5–15)
AST: 25 U/L (ref 15–41)
Albumin: 3.8 g/dL (ref 3.5–5.0)
BILIRUBIN TOTAL: 0.9 mg/dL (ref 0.3–1.2)
BUN: 22 mg/dL — ABNORMAL HIGH (ref 6–20)
CALCIUM: 9.3 mg/dL (ref 8.9–10.3)
CO2: 29 mmol/L (ref 22–32)
Chloride: 101 mmol/L (ref 101–111)
Creatinine, Ser: 0.9 mg/dL (ref 0.44–1.00)
GFR calc non Af Amer: >60 mL/min (ref >60)
GLUCOSE: 147 mg/dL — AB (ref 65–99)
POTASSIUM: 3.6 mmol/L (ref 3.5–5.1)
Sodium: 137 mmol/L (ref 135–145)
TOTAL PROTEIN: 7.1 g/dL (ref 6.5–8.1)

## 2016-02-29 LAB — CBC WITH DIFFERENTIAL/PLATELET
Basophils Absolute: 0.1 10*3/uL (ref 0–0.1)
Basophils Relative: 1 %
EOS ABS: 0.3 10*3/uL (ref 0–0.7)
Eosinophils Relative: 5 %
HEMATOCRIT: 36.1 % (ref 35.0–47.0)
HEMOGLOBIN: 12 g/dL (ref 12.0–16.0)
LYMPHS ABS: 2 10*3/uL (ref 1.0–3.6)
Lymphocytes Relative: 33 %
MCH: 27.6 pg (ref 26.0–34.0)
MCHC: 33.3 g/dL (ref 32.0–36.0)
MCV: 83 fL (ref 80.0–100.0)
MONOS PCT: 9 %
Monocytes Absolute: 0.5 10*3/uL (ref 0.2–0.9)
NEUTROS ABS: 3.2 10*3/uL (ref 1.4–6.5)
NEUTROS PCT: 52 %
Platelets: 175 10*3/uL (ref 150–440)
RBC: 4.35 MIL/uL (ref 3.80–5.20)
RDW: 12.9 % (ref 11.5–14.5)
WBC: 6.1 10*3/uL (ref 3.6–11.0)

## 2016-02-29 MED ORDER — SODIUM CHLORIDE 0.9% FLUSH
10.0000 mL | INTRAVENOUS | Status: DC | PRN
  Administered 2016-02-29: 10 mL
  Filled 2016-02-29: qty 10

## 2016-02-29 MED ORDER — HEPARIN SOD (PORK) LOCK FLUSH 100 UNIT/ML IV SOLN
500.0000 [IU] | Freq: Once | INTRAVENOUS | Status: AC | PRN
  Administered 2016-02-29: 500 [IU]
  Filled 2016-02-29: qty 5

## 2016-02-29 MED ORDER — SODIUM CHLORIDE 0.9 % IV SOLN
Freq: Once | INTRAVENOUS | Status: AC
  Administered 2016-02-29: 11:00:00 via INTRAVENOUS
  Filled 2016-02-29: qty 1000

## 2016-02-29 MED ORDER — SODIUM CHLORIDE 0.9 % IV SOLN
240.0000 mg | Freq: Once | INTRAVENOUS | Status: AC
  Administered 2016-02-29: 240 mg via INTRAVENOUS
  Filled 2016-02-29: qty 8

## 2016-02-29 NOTE — Progress Notes (Signed)
Patient reports that left arm aches "like a tooth ache" radiates down to hand and finger on left ahand are numb

## 2016-02-29 NOTE — Telephone Encounter (Signed)
Called to give name of her ortho doctor which was requested. It is Dr Ellene Route.

## 2016-03-01 LAB — THYROID PANEL WITH TSH
FREE THYROXINE INDEX: 1.8 (ref 1.2–4.9)
T3 Uptake Ratio: 27 % (ref 24–39)
T4 TOTAL: 6.7 ug/dL (ref 4.5–12.0)
TSH: 1.13 u[IU]/mL (ref 0.450–4.500)

## 2016-03-03 ENCOUNTER — Ambulatory Visit
Admission: RE | Admit: 2016-03-03 | Discharge: 2016-03-03 | Disposition: A | Discharge status: Home / Self Care | Payer: BLUE CROSS/BLUE SHIELD | Source: Ambulatory Visit | Attending: Oncology | Admitting: Oncology

## 2016-03-03 DIAGNOSIS — R51 Headache: Secondary | ICD-10-CM | POA: Insufficient documentation

## 2016-03-03 DIAGNOSIS — M47896 Other spondylosis, lumbar region: Secondary | ICD-10-CM | POA: Insufficient documentation

## 2016-03-03 DIAGNOSIS — C3491 Malignant neoplasm of unspecified part of right bronchus or lung: Secondary | ICD-10-CM | POA: Diagnosis not present

## 2016-03-03 DIAGNOSIS — M545 Low back pain: Secondary | ICD-10-CM

## 2016-03-03 DIAGNOSIS — M899 Disorder of bone, unspecified: Secondary | ICD-10-CM | POA: Diagnosis not present

## 2016-03-03 DIAGNOSIS — M47812 Spondylosis without myelopathy or radiculopathy, cervical region: Secondary | ICD-10-CM | POA: Diagnosis not present

## 2016-03-03 DIAGNOSIS — G939 Disorder of brain, unspecified: Secondary | ICD-10-CM | POA: Insufficient documentation

## 2016-03-03 DIAGNOSIS — M4802 Spinal stenosis, cervical region: Secondary | ICD-10-CM | POA: Insufficient documentation

## 2016-03-03 DIAGNOSIS — C7951 Secondary malignant neoplasm of bone: Secondary | ICD-10-CM | POA: Insufficient documentation

## 2016-03-03 DIAGNOSIS — R519 Headache, unspecified: Secondary | ICD-10-CM

## 2016-03-03 DIAGNOSIS — M79602 Pain in left arm: Secondary | ICD-10-CM

## 2016-03-03 MED ORDER — GADOBENATE DIMEGLUMINE 529 MG/ML IV SOLN
20.0000 mL | Freq: Once | INTRAVENOUS | Status: AC | PRN
  Administered 2016-03-03: 16 mL via INTRAVENOUS

## 2016-03-06 ENCOUNTER — Ambulatory Visit: Payer: BLUE CROSS/BLUE SHIELD

## 2016-03-06 ENCOUNTER — Other Ambulatory Visit: Payer: BLUE CROSS/BLUE SHIELD

## 2016-03-06 ENCOUNTER — Ambulatory Visit: Payer: BLUE CROSS/BLUE SHIELD | Admitting: Oncology

## 2016-03-06 NOTE — Progress Notes (Signed)
[place]  [phone]  Fax:(336) [place] DOB: 03-22-1956  MR#: [id number]  JYN#:[id number]  Patient Care Team: Marguerita Merles, MD as PCP - General (Family Medicine)  CHIEF COMPLAINT:  Chief Complaint  Patient presents with  . Metastatic lung cancer (metastasis from lung to other site),    INTERVAL HISTORY: Patient returns to clinic today for further evaluation and continuation of nivolumab. She continues to have left back/hip pain and headaches as well. It hurts only when she puts pressure on it by leaning on it and then the numbness goes to her fingers/thumb. It also bothers her when she lifts weights with it at exercise class which she has stopped using her left arm at class. She otherwise feels well and is tolerating her treatments. She denies any recent fevers. She has a good appetite and denies weight loss. She denies any chest pain or tightness, hemoptysis, or shortness of breath. She denies any nausea, vomiting, diarrhea or constipation.  Patient offers no other specific complaints today.   REVIEW OF SYSTEMS:   Review of Systems  Constitutional: Positive for malaise/fatigue. Negative for fever, weight loss and diaphoresis.  Eyes: Negative.   Respiratory: Negative.   Cardiovascular: Negative for chest pain, palpitations and leg swelling.  Gastrointestinal: Negative for nausea, abdominal pain, diarrhea, blood in stool and melena.  Genitourinary: Negative.   Musculoskeletal: Positive for back pain.       Left hip pain, left arm pain  Skin: Negative.   Neurological: Negative.   Psychiatric/Behavioral: Negative.     As per HPI. Otherwise, a complete review of systems is negatve.   PAST MEDICAL HISTORY: Past Medical History  Diagnosis Date  . Lung cancer ([place])   . Hypertension   . Hypercholesteremia   . Metastatic lung cancer (metastasis from lung to other site) ([place]) 01/21/2015  . Anemia 01/21/2015  . 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 GERD (gastroesophageal reflux disease)   . Last menstrual period (LMP) > 10 days ago 2007  . Anxiety     PAST SURGICAL HISTORY: Past Surgical History  Procedure Laterality Date  . Insertion / placement pleural catheter    . Portacath placement Right 04/26/2015    Procedure: INSERTION PORT-A-CATH;  Surgeon: Nestor Lewandowsky, MD;  Location: [facility] ORS;  Service: General;  Laterality: Right;  . Abdominal hysterectomy      partial    FAMILY HISTORY Family History  Problem Relation Age of Onset  . Cervical cancer Mother   . Uterine cancer Maternal Grandmother     GYNECOLOGIC HISTORY:  No LMP recorded. Patient has had a hysterectomy.     ADVANCED DIRECTIVES:    HEALTH MAINTENANCE: Social History  Substance Use Topics  . Smoking status: Former Smoker -- 0.25 packs/day for 8 years    Types: Cigarettes    Quit date: 07/20/2014  . Smokeless tobacco: Not on file  . Alcohol Use: No    Allergies  Allergen Reactions  . Tegaderm Ag Mesh [Silver] Other (See Comments)    blisters    Current Outpatient Prescriptions  Medication Sig Dispense Refill  . ALPRAZolam (XANAX) 1 MG tablet Take 1/2 tablet 1-2x per day as needed for anxiety and take 1 tablet at night for anxiety/sleeep 45 tablet 0  . ferrous sulfate 325 (65 FE) MG tablet Take 325 mg by mouth daily with breakfast.    . furosemide (LASIX) 20 MG tablet Take 1 tablet daily as needed for swelling. [place]  tablet 2  . HYDROcodone-acetaminophen (NORCO) 5-325 MG per tablet Take 1 tablet by mouth every 6 (six) hours as needed for moderate pain. 90 tablet 0  . lisinopril-hydrochlorothiazide (PRINZIDE,ZESTORETIC) 10-12.5 MG per tablet Take 1 tablet by mouth daily.    . Multiple Vitamins-Minerals (MULTIVITAMIN WITH MINERALS) tablet Take 1 tablet by mouth daily. 30 tablet 3  . omeprazole (PRILOSEC) 20 MG capsule Take 20 mg by mouth every morning.     [name] oxyCODONE-acetaminophen (PERCOCET/ROXICET) 5-325 MG tablet Take by mouth.      . potassium chloride SA (K-DUR,KLOR-CON) 20 MEQ tablet Take 1 tablet (20 mEq total) by mouth daily. 90 tablet 1  . promethazine (PHENERGAN) 25 MG tablet Take 25 mg by mouth every 6 (six) hours as needed for nausea or vomiting.    . simvastatin (ZOCOR) 20 MG tablet Take 20 mg by mouth at bedtime.     . lidocaine-prilocaine (EMLA) cream Apply cream 1 hour before chemotherapy treatment 30 g 1   No current facility-administered medications for this visit.   Facility-Administered Medications Ordered in Other Visits  Medication Dose Route Frequency Provider Last Rate Last Dose  . heparin lock flush 100 unit/mL  250 Units Intracatheter PRN Evlyn Kanner, NP   250 Units at 01/21/15 1734  . sodium chloride 0.9 % injection 10 mL  10 mL Intracatheter PRN Evlyn Kanner, NP   10 mL at 01/21/15 1734  . sodium chloride 0.9 % injection 10 mL  10 mL Intravenous PRN Leia Alf, MD   10 mL at 04/27/15 0905  . sodium chloride 0.9 % injection 3 mL  3 mL Intracatheter PRN Evlyn Kanner, NP   3 mL at 01/21/15 1731    OBJECTIVE: BP 122/79 mmHg  Pulse 75  Temp(Src) 97.9 F (36.6 C) (Oral)  Wt 177 lb 0.5 oz (80.3 kg)   Body mass index is 29.46 kg/(m^2).    ECOG FS:0 - Asymptomatic  General: Well-developed, well-nourished, no acute distress. Eyes: Pink conjunctiva, anicteric sclera. Lungs: Clear to auscultation bilaterally. Heart: Regular rate and rhythm. No rubs, murmurs, or gallops. Musculoskeletal: No edemal, No cyanosis, or clubbing.  Neuro: Alert, answering all questions appropriately.  Skin: No rashes or petechiae noted. Psych: Normal affect.    LAB RESULTS:  Appointment on 02/29/2016  Component Date Value Ref Range Status  . WBC 02/29/2016 6.1  3.6 - 11.0 K/uL Final  . RBC 02/29/2016 4.35  3.80 - 5.20 MIL/uL Final  . Hemoglobin 02/29/2016 12.0  12.0 - 16.0 g/dL Final  . HCT 02/29/2016 36.1  35.0 - 47.0 % Final  . MCV 02/29/2016 83.0  80.0 - 100.0 fL Final  . MCH 02/29/2016 27.6   26.0 - 34.0 pg Final  . MCHC 02/29/2016 33.3  32.0 - 36.0 g/dL Final  . RDW 02/29/2016 12.9  11.5 - 14.5 % Final  . Platelets 02/29/2016 175  150 - 440 K/uL Final  . Neutrophils Relative % 02/29/2016 52   Final  . Neutro Abs 02/29/2016 3.2  1.4 - 6.5 K/uL Final  . Lymphocytes Relative 02/29/2016 33   Final  . Lymphs Abs 02/29/2016 2.0  1.0 - 3.6 K/uL Final  . Monocytes Relative 02/29/2016 9   Final  . Monocytes Absolute 02/29/2016 0.5  0.2 - 0.9 K/uL Final  . Eosinophils Relative 02/29/2016 5   Final  . Eosinophils Absolute 02/29/2016 0.3  0 - 0.7 K/uL Final  . Basophils Relative 02/29/2016 1   Final  . Basophils Absolute 02/29/2016 0.1  0 - 0.1 K/uL Final  . Sodium 02/29/2016 137  135 - 145 mmol/L Final  . Potassium 02/29/2016 3.6  3.5 - 5.1 mmol/L Final  . Chloride 02/29/2016 101  101 - 111 mmol/L Final  . CO2 02/29/2016 29  22 - 32 mmol/L Final  . Glucose, Bld 02/29/2016 147* 65 - 99 mg/dL Final  . BUN 02/29/2016 22* 6 - 20 mg/dL Final  . Creatinine, Ser 02/29/2016 0.90  0.44 - 1.00 mg/dL Final  . Calcium 02/29/2016 9.3  8.9 - 10.3 mg/dL Final  . Total Protein 02/29/2016 7.1  6.5 - 8.1 g/dL Final  . Albumin 02/29/2016 3.8  3.5 - 5.0 g/dL Final  . AST 02/29/2016 25  15 - 41 U/L Final  . ALT 02/29/2016 22  14 - 54 U/L Final  . Alkaline Phosphatase 02/29/2016 65  38 - 126 U/L Final  . Total Bilirubin 02/29/2016 0.9  0.3 - 1.2 mg/dL Final  . GFR calc non Af Amer 02/29/2016 >60  >60 mL/min Final  . GFR calc Af Amer 02/29/2016 >60  >60 mL/min Final   Comment: (NOTE) The eGFR has been calculated using the CKD EPI equation. This calculation has not been validated in all clinical situations. eGFR's persistently <60 mL/min signify possible Chronic Kidney Disease.   . Anion gap 02/29/2016 7  5 - 15 Final  . TSH 02/29/2016 1.130  0.450 - 4.500 uIU/mL Final  . T4, Total 02/29/2016 6.7  4.5 - 12.0 ug/dL Final  . T3 Uptake Ratio 02/29/2016 27  24 - 39 % Final  . Free Thyroxine Index  02/29/2016 1.8  1.2 - 4.9 Final   Comment: (NOTE) Performed At: [facility] [place], [place] [id number] Lindon Romp MD HA:[id number]     STUDIES:  IMPRESSION: 1. New and enlarging bilateral pulmonary nodules. Nodules are currently too numerous to count. An index nodule in the left lower lobe is increased from 1.2 cm diameter to 1.8 cm diameter. 2. Centrilobular emphysema with airway thickening. 3. 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 4. Small right pleural effusion, likely malignant. 5. 1.5 by 1.8 cm hyperdense exophytic left kidney upper pole lesion, complex cyst versus mass. Not visibly hypermetabolic on prior PET-CT, and with similar hyperdensity on portal venous and delayed phase images.  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 On 12/01/14 - Right Pleural Fluid Cytology. POSITIVE FOR MALIGNANT CELLS. ADENOCARCINOMA, CONSISTENT WITH LUNG ORIGIN. Molecular testing for EGFR, ALK and others is negative  PLAN:   1. Lung Cancer: Pemetrexed was discontinued in April 2017 due to progression of disease on CT scan.  Plan to reimage in approximately July 2017. Proceed with cycle 4 of nivolumab today. Return to clinic in 2 weeks for consideration of cycle 5.  2. Headaches: MRI the brain completed on March 03, 2016 is essentially unchanged from previous.  3. Pain (back,hip, arm): MRI results reviewed independently revealing metastatic disease in C6 there is likely causing her symptoms. Lumbar MRI revealed an enhancing lesion and L2 as well as a suspicious 12 mm lesion and right sacrum. We will give a referral to radiation oncology in the near future.  4. Kidney lesion: Unclear if metastatic deposit or second primary of the left kidney. Continue to monitor closely.  5. Anxiety: Resolved. Continue  Xanax 1 mg at night and 0.5 mg as needed during the day.  6. Diarrhea: Resolved. Monitor while on nivolumab.     7. Hypokalemia: WNL today. Continue oral potassium supplement to 20 meq BID.     8. Patient has been advised to call or come to the ER in case of any worsening or new symptoms. She is agreeable to this plan  Lloyd Huger, MD 03/06/2016 4:19 PM

## 2016-03-07 ENCOUNTER — Inpatient Hospital Stay (HOSPITAL_BASED_OUTPATIENT_CLINIC_OR_DEPARTMENT_OTHER): Payer: BLUE CROSS/BLUE SHIELD | Admitting: Oncology

## 2016-03-07 ENCOUNTER — Telehealth: Payer: Self-pay | Admitting: *Deleted

## 2016-03-07 VITALS — BP 153/85 | HR 92 | Temp 96.5°F | Resp 18 | Wt 177.2 lb

## 2016-03-07 DIAGNOSIS — C3491 Malignant neoplasm of unspecified part of right bronchus or lung: Secondary | ICD-10-CM | POA: Diagnosis not present

## 2016-03-07 DIAGNOSIS — C7802 Secondary malignant neoplasm of left lung: Secondary | ICD-10-CM

## 2016-03-07 DIAGNOSIS — R531 Weakness: Secondary | ICD-10-CM

## 2016-03-07 DIAGNOSIS — R5381 Other malaise: Secondary | ICD-10-CM

## 2016-03-07 DIAGNOSIS — J910 Malignant pleural effusion: Secondary | ICD-10-CM

## 2016-03-07 DIAGNOSIS — E876 Hypokalemia: Secondary | ICD-10-CM

## 2016-03-07 DIAGNOSIS — M25552 Pain in left hip: Secondary | ICD-10-CM

## 2016-03-07 DIAGNOSIS — M79602 Pain in left arm: Secondary | ICD-10-CM

## 2016-03-07 DIAGNOSIS — M549 Dorsalgia, unspecified: Secondary | ICD-10-CM

## 2016-03-07 DIAGNOSIS — R5383 Other fatigue: Secondary | ICD-10-CM

## 2016-03-07 DIAGNOSIS — Z87891 Personal history of nicotine dependence: Secondary | ICD-10-CM

## 2016-03-07 DIAGNOSIS — C7931 Secondary malignant neoplasm of brain: Secondary | ICD-10-CM

## 2016-03-07 DIAGNOSIS — R51 Headache: Secondary | ICD-10-CM

## 2016-03-07 DIAGNOSIS — N289 Disorder of kidney and ureter, unspecified: Secondary | ICD-10-CM

## 2016-03-07 MED ORDER — TRAMADOL HCL 50 MG PO TABS: 50.0000 mg | ORAL_TABLET | Freq: Four times a day (QID) | ORAL | Status: DC | PRN

## 2016-03-07 NOTE — Progress Notes (Signed)
[place]  Telephone:(336) [phone]  Fax:(336) [place] DOB: 05/20/1956  MR#: [id number]  XHB#:[id number]  Patient Care Team: Marguerita Merles, MD as PCP - General (Family Medicine)  CHIEF COMPLAINT:  Chief Complaint  Patient presents with  . Lung Cancer    CT results    INTERVAL HISTORY: Patient returns to clinic today for further evaluation, CT results and increased pain. She continues to have increasing left back/hip pain, numbness in her fingers/thumb and headaches as well. She has completely stopped exercise classes but wants to continue to try to walk for exercise as much as possible. She is only taking tylenol for pain which is not helping much. She is requesting Tramadol because she feels like it has worked best in the past. She does not take oxycodone or hydrocodone because it upsets her stomach. She otherwise feels well and is tolerating her treatments. She denies any recent fevers. She has a good appetite and denies weight loss. She denies any chest pain or tightness, hemoptysis, or shortness of breath. She denies any nausea, vomiting, diarrhea or constipation.  Patient offers no other specific complaints today.   REVIEW OF SYSTEMS:   Review of Systems  Constitutional: Positive for malaise/fatigue. Negative for fever, weight loss and diaphoresis.  Eyes: Negative.   Respiratory: Negative.   Cardiovascular: Negative for chest pain, palpitations and leg swelling.  Gastrointestinal: Negative for nausea, abdominal pain, diarrhea, blood in stool and melena.  Genitourinary: Negative.   Musculoskeletal: Positive for back pain.       Left hip pain, left arm pain  Skin: Negative.   Neurological: Negative.   Psychiatric/Behavioral: Negative.     As per HPI. Otherwise, a complete review of systems is negatve.   PAST MEDICAL HISTORY: Past Medical History  Diagnosis Date  . Lung cancer ([place])   . Hypertension   . Hypercholesteremia   .  Metastatic lung cancer (metastasis from lung to other site) ([place]) 01/21/2015  . Anemia 01/21/2015  . 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 GERD (gastroesophageal reflux disease)   . Last menstrual period (LMP) > 10 days ago 2007  . Anxiety     PAST SURGICAL HISTORY: Past Surgical History  Procedure Laterality Date  . Insertion / placement pleural catheter    . Portacath placement Right 04/26/2015    Procedure: INSERTION PORT-A-CATH;  Surgeon: Nestor Lewandowsky, MD;  Location: [facility] ORS;  Service: General;  Laterality: Right;  . Abdominal hysterectomy      partial    FAMILY HISTORY Family History  Problem Relation Age of Onset  . Cervical cancer Mother   . Uterine cancer Maternal Grandmother     GYNECOLOGIC HISTORY:  No LMP recorded. Patient has had a hysterectomy.     ADVANCED DIRECTIVES:    HEALTH MAINTENANCE: Social History  Substance Use Topics  . Smoking status: Former Smoker -- 0.25 packs/day for 8 years    Types: Cigarettes    Quit date: 07/20/2014  . Smokeless tobacco: Not on file  . Alcohol Use: No    Allergies  Allergen Reactions  . Tegaderm Ag Mesh [Silver] Other (See Comments)    blisters  . Aspirin Other (See Comments)    GIB    Current Outpatient Prescriptions  Medication Sig Dispense Refill  . ALPRAZolam (XANAX) 1 MG tablet Take 1/2 tablet 1-2x per day as needed for anxiety and take 1 tablet at night for anxiety/sleeep 45 tablet 0  . ferrous sulfate 325 (65  FE) MG tablet Take 325 mg by mouth daily with breakfast.    . lidocaine-prilocaine (EMLA) cream Apply cream 1 hour before chemotherapy treatment 30 g 1  . lisinopril-hydrochlorothiazide (PRINZIDE,ZESTORETIC) 10-12.5 MG per tablet Take 1 tablet by mouth daily.    . Multiple Vitamins-Minerals (MULTIVITAMIN WITH MINERALS) tablet Take 1 tablet by mouth daily. 30 tablet 3  . omeprazole (PRILOSEC) 20 MG capsule Take 20 mg by mouth every morning.     . potassium chloride SA (K-DUR,KLOR-CON) 20 MEQ tablet Take  1 tablet (20 mEq total) by mouth daily. 90 tablet 1  . promethazine (PHENERGAN) 25 MG tablet Take 25 mg by mouth every 6 (six) hours as needed for nausea or vomiting.    . simvastatin (ZOCOR) 20 MG tablet Take 20 mg by mouth at bedtime.     [name] HYDROcodone-acetaminophen (NORCO) 5-325 MG per tablet Take 1 tablet by mouth every 6 (six) hours as needed for moderate pain. (Patient not taking: Reported on 03/07/2016) 90 tablet 0  . oxyCODONE-acetaminophen (PERCOCET/ROXICET) 5-325 MG tablet Take by mouth. Reported on 03/07/2016    . traMADol (ULTRAM) 50 MG tablet Take 1 tablet (50 mg total) by mouth every 6 (six) hours as needed. 90 tablet 1   No current facility-administered medications for this visit.   Facility-Administered Medications Ordered in Other Visits  Medication Dose Route Frequency Provider Last Rate Last Dose  . heparin lock flush 100 unit/mL  250 Units Intracatheter PRN Evlyn Kanner, NP   250 Units at 01/21/15 1734  . sodium chloride 0.9 % injection 10 mL  10 mL Intracatheter PRN Evlyn Kanner, NP   10 mL at 01/21/15 1734  . sodium chloride 0.9 % injection 10 mL  10 mL Intravenous PRN Leia Alf, MD   10 mL at 04/27/15 0905  . sodium chloride 0.9 % injection 3 mL  3 mL Intracatheter PRN Evlyn Kanner, NP   3 mL at 01/21/15 1731    OBJECTIVE: BP 153/85 mmHg  Pulse 92  Temp(Src) 96.5 F (35.8 C) (Tympanic)  Resp 18  Wt 177 lb 3.2 oz (80.377 kg)   Body mass index is 29.49 kg/(m^2).    ECOG FS:1 - Symptomatic but completely ambulatory  General: Well-developed, well-nourished, no acute distress. Eyes: Pink conjunctiva, anicteric sclera. Lungs: Clear to auscultation bilaterally. Heart: Regular rate and rhythm. No rubs, murmurs, or gallops. Musculoskeletal: No edemal, No cyanosis, or clubbing.  Neuro: Alert, answering all questions appropriately.  Skin: No rashes or petechiae noted. Psych: Normal affect.    LAB RESULTS:  No visits with results within 3 Day(s) from  this visit. Latest known visit with results is:  Appointment on 02/29/2016  Component Date Value Ref Range Status  . WBC 02/29/2016 6.1  3.6 - 11.0 K/uL Final  . RBC 02/29/2016 4.35  3.80 - 5.20 MIL/uL Final  . Hemoglobin 02/29/2016 12.0  12.0 - 16.0 g/dL Final  . HCT 02/29/2016 36.1  35.0 - 47.0 % Final  . MCV 02/29/2016 83.0  80.0 - 100.0 fL Final  . MCH 02/29/2016 27.6  26.0 - 34.0 pg Final  . MCHC 02/29/2016 33.3  32.0 - 36.0 g/dL Final  . RDW 02/29/2016 12.9  11.5 - 14.5 % Final  . Platelets 02/29/2016 175  150 - 440 K/uL Final  . Neutrophils Relative % 02/29/2016 52   Final  . Neutro Abs 02/29/2016 3.2  1.4 - 6.5 K/uL Final  . Lymphocytes Relative 02/29/2016 33   Final  . Lymphs Abs  02/29/2016 2.0  1.0 - 3.6 K/uL Final  . Monocytes Relative 02/29/2016 9   Final  . Monocytes Absolute 02/29/2016 0.5  0.2 - 0.9 K/uL Final  . Eosinophils Relative 02/29/2016 5   Final  . Eosinophils Absolute 02/29/2016 0.3  0 - 0.7 K/uL Final  . Basophils Relative 02/29/2016 1   Final  . Basophils Absolute 02/29/2016 0.1  0 - 0.1 K/uL Final  . Sodium 02/29/2016 137  135 - 145 mmol/L Final  . Potassium 02/29/2016 3.6  3.5 - 5.1 mmol/L Final  . Chloride 02/29/2016 101  101 - 111 mmol/L Final  . CO2 02/29/2016 29  22 - 32 mmol/L Final  . Glucose, Bld 02/29/2016 147* 65 - 99 mg/dL Final  . BUN 02/29/2016 22* 6 - 20 mg/dL Final  . Creatinine, Ser 02/29/2016 0.90  0.44 - 1.00 mg/dL Final  . Calcium 02/29/2016 9.3  8.9 - 10.3 mg/dL Final  . Total Protein 02/29/2016 7.1  6.5 - 8.1 g/dL Final  . Albumin 02/29/2016 3.8  3.5 - 5.0 g/dL Final  . AST 02/29/2016 25  15 - 41 U/L Final  . ALT 02/29/2016 22  14 - 54 U/L Final  . Alkaline Phosphatase 02/29/2016 65  38 - 126 U/L Final  . Total Bilirubin 02/29/2016 0.9  0.3 - 1.2 mg/dL Final  . GFR calc non Af Amer 02/29/2016 >60  >60 mL/min Final  . GFR calc Af Amer 02/29/2016 >60  >60 mL/min Final   Comment: (NOTE) The eGFR has been calculated using the CKD  EPI equation. This calculation has not been validated in all clinical situations. eGFR's persistently <60 mL/min signify possible Chronic Kidney Disease.   . Anion gap 02/29/2016 7  5 - 15 Final  . TSH 02/29/2016 1.130  0.450 - 4.500 uIU/mL Final  . T4, Total 02/29/2016 6.7  4.5 - 12.0 ug/dL Final  . T3 Uptake Ratio 02/29/2016 27  24 - 39 % Final  . Free Thyroxine Index 02/29/2016 1.8  1.2 - 4.9 Final   Comment: (NOTE) Performed At: [facility] [place], [place] [id number] Lindon Romp MD AX:[id number]     STUDIES:  IMPRESSION: 1. New and enlarging bilateral pulmonary nodules. Nodules are currently too numerous to count. An index nodule in the left lower lobe is increased from 1.2 cm diameter to 1.8 cm diameter. 2. Centrilobular emphysema with airway thickening. 3. 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 4. Small right pleural effusion, likely malignant. 5. 1.5 by 1.8 cm hyperdense exophytic left kidney upper pole lesion, complex cyst versus mass. Not visibly hypermetabolic on prior PET-CT, and with similar hyperdensity on portal venous and delayed phase images.  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 On 12/01/14 - Right Pleural Fluid Cytology. POSITIVE FOR MALIGNANT CELLS. ADENOCARCINOMA, CONSISTENT WITH LUNG ORIGIN. Molecular testing for EGFR, ALK and others is negative  PLAN:   1. Lung Cancer: Pemetrexed was discontinued in April 2017 due to progression of disease on CT scan.  Plan to reimage in approximately July 2017. Will continue with nivolumab cycle 5 next week.  2. Headaches: MRI the brain completed on March 03, 2016 is essentially unchanged from previous.  3. Pain (back,hip, arm): MRI results reviewed independently revealing metastatic disease in C6 there is likely causing her  symptoms. Lumbar MRI revealed an enhancing lesion and L2 as well as a  suspicious 12 mm lesion and right sacrum. Referral to radiation oncology was sent today and patient has appointment tomorrow at 2:00.  Prescription given to patient for Tramadol 50 mg Q 6 H PRN. 4. Kidney lesion: Unclear if metastatic deposit or second primary of the left kidney. Continue to monitor closely.  5. Anxiety: Resolved. Continue Xanax 1 mg at night and 0.5 mg as needed during the day.     6. Diarrhea: Resolved. Monitor while on nivolumab.     7. Hypokalemia: Resolved. Continue oral potassium supplement to 20 meq BID.     8. Patient has been advised to call or come to the ER in case of any worsening or new symptoms. She is agreeable to this plan  Mayra Reel, NP 03/07/2016 11:24 AM

## 2016-03-07 NOTE — Telephone Encounter (Addendum)
Have her come in to see T Amelung today per Dr Grayland Ormond, Patient agrees to 1030 appt

## 2016-03-07 NOTE — Telephone Encounter (Signed)
States her pain is worse and she needs stronger med. Also asking for her CT results

## 2016-03-08 ENCOUNTER — Encounter: Payer: Self-pay | Admitting: Radiation Oncology

## 2016-03-08 ENCOUNTER — Other Ambulatory Visit: Payer: Self-pay | Admitting: *Deleted

## 2016-03-08 ENCOUNTER — Ambulatory Visit
Admission: RE | Admit: 2016-03-08 | Discharge: 2016-03-08 | Disposition: A | Discharge status: Home / Self Care | Payer: BLUE CROSS/BLUE SHIELD | Source: Ambulatory Visit | Attending: Radiation Oncology | Admitting: Radiation Oncology

## 2016-03-08 VITALS — BP 135/88 | HR 70 | Temp 96.3°F | Wt 175.5 lb

## 2016-03-08 DIAGNOSIS — C801 Malignant (primary) neoplasm, unspecified: Secondary | ICD-10-CM | POA: Insufficient documentation

## 2016-03-08 DIAGNOSIS — Z510 Encounter for antineoplastic radiation therapy: Secondary | ICD-10-CM | POA: Diagnosis not present

## 2016-03-08 DIAGNOSIS — M545 Low back pain: Secondary | ICD-10-CM | POA: Insufficient documentation

## 2016-03-08 DIAGNOSIS — C7951 Secondary malignant neoplasm of bone: Secondary | ICD-10-CM | POA: Diagnosis present

## 2016-03-08 DIAGNOSIS — M79602 Pain in left arm: Secondary | ICD-10-CM | POA: Insufficient documentation

## 2016-03-08 DIAGNOSIS — C7801 Secondary malignant neoplasm of right lung: Secondary | ICD-10-CM

## 2016-03-08 DIAGNOSIS — C3491 Malignant neoplasm of unspecified part of right bronchus or lung: Secondary | ICD-10-CM

## 2016-03-08 NOTE — Progress Notes (Signed)
Radiation Oncology Follow up Note  Name: Patricia Kelley   Date:   03/08/2016 MRN:  [id number] DOB: [birth year]                                     Old patient new area (OPNA) for cervical and lumbar spine metastases   This 60 y.o. female presents to the clinic today for evaluation of progressive disease with spinal metastasis and lumbar and sacral metastasis by MRI scan.  REFERRING PROVIDER: Marguerita Merles, MD  HPI: Patient is a. 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 EGFR, ALK were negative. She is currently on Nivolumab. She's been having left arm pain which is progressively been worse. She also has some headaches. MRI scan of the brain was negative for metastatic disease. She's also having some slight lower back pain. MRI scan of the cervical spine showed metastatic disease at C6 with probable tumor in the foramina on the left at C5-6 and possibly 6-7. MRI of lumbar spine so enhancing lesion superior vertebral body L2 most likely metastatic disease. There is also a 1.2 cm lesion in the right sacrum. She's been ambulating well. She has no sensory or motor loss in her upper or lower extremities. I been asked to evaluate the patient for possible palliative radiation therapy.  COMPLICATIONS OF TREATMENT: none  FOLLOW UP COMPLIANCE: keeps appointments   PHYSICAL EXAM:  BP 135/88 mmHg  Pulse 70  Temp(Src) 96.3 F (35.7 C)  Wt 175 lb 7.8 oz (79.6 kg) Range of motion of her upper extremities does not elicit pain. Range of motion of her lower extremities does not elicit pain. Proprioception is intact. Motor sensory and DTR levels are equal and symmetric in the upper lower extremities. Range of motion of her neck and deep palpation of her cervical spine to elicit some pain. Well-developed well-nourished patient in NAD. HEENT reveals  PERLA, EOMI, discs not visualized.  Oral cavity is clear. No oral mucosal lesions are identified. Neck is clear without evidence of cervical or supraclavicular adenopathy. Lungs are clear to A&P. Cardiac examination is essentially unremarkable with regular rate and rhythm without murmur rub or thrill. Abdomen is benign with no organomegaly or masses noted. Motor sensory and DTR levels are equal and symmetric in the upper and lower extremities. Cranial nerves II through XII are grossly intact. Proprioception is intact. No peripheral adenopathy or edema is identified. No motor or sensory levels are noted. Crude visual fields are within normal range.  RADIOLOGY RESULTS: MRI scan of cervical spine and lumbar spine reviewed and compatible with the above-stated findings. Bone scan ordered  PLAN: I've ordered a bone scan to delineate all areas of known bone involvement at this time from her metastatic adenocarcinoma presumably of the lung. I have gone ahead and scheduled for early next week CT simulation of her cervical spine to get started on that area and will review her bone scan before proceeding with treatment. Would plan on 3000 cGy in 10 fractions to her cervical spine. Also depending on her bone scan may offer palliative radiation therapy to her lumbar spine and sacrum since his is starting to cause pain and some slight difficulty with ambulation. Risks and benefits of radiation to cervical spine were reviewed. Side effects such as dysphasia from radiation esophagitis  fatigue alteration of blood counts skin reaction all were reviewed in detail. Patient and husband seem to comprehend my treatment plan well.  I would like to take this opportunity to thank you for allowing me to participate in the care of your patient.Armstead Peaks., MD

## 2016-03-10 ENCOUNTER — Encounter
Admission: RE | Admit: 2016-03-10 | Discharge: 2016-03-10 | Disposition: A | Discharge status: Home / Self Care | Payer: BLUE CROSS/BLUE SHIELD | Source: Ambulatory Visit | Attending: Radiation Oncology | Admitting: Radiation Oncology

## 2016-03-10 DIAGNOSIS — C7951 Secondary malignant neoplasm of bone: Secondary | ICD-10-CM | POA: Diagnosis not present

## 2016-03-10 DIAGNOSIS — C3491 Malignant neoplasm of unspecified part of right bronchus or lung: Secondary | ICD-10-CM | POA: Diagnosis not present

## 2016-03-10 MED ORDER — TECHNETIUM TC 99M MEDRONATE IV KIT
20.0000 | PACK | Freq: Once | INTRAVENOUS | Status: AC | PRN
  Administered 2016-03-10: 20.44 via INTRAVENOUS

## 2016-03-10 NOTE — Progress Notes (Signed)
03/07/2016 visit 03/08/2016 visit Dr. Grayland Ormond was available for consultation and review of plan of care for this patient

## 2016-03-13 ENCOUNTER — Ambulatory Visit
Admission: RE | Admit: 2016-03-13 | Discharge: 2016-03-13 | Disposition: A | Discharge status: Home / Self Care | Payer: BLUE CROSS/BLUE SHIELD | Source: Ambulatory Visit | Attending: Radiation Oncology | Admitting: Radiation Oncology

## 2016-03-13 DIAGNOSIS — Z510 Encounter for antineoplastic radiation therapy: Secondary | ICD-10-CM | POA: Diagnosis not present

## 2016-03-14 ENCOUNTER — Inpatient Hospital Stay: Payer: BLUE CROSS/BLUE SHIELD

## 2016-03-14 ENCOUNTER — Inpatient Hospital Stay (HOSPITAL_BASED_OUTPATIENT_CLINIC_OR_DEPARTMENT_OTHER): Payer: BLUE CROSS/BLUE SHIELD | Admitting: Oncology

## 2016-03-14 VITALS — BP 124/81 | HR 66 | Temp 96.6°F | Resp 18 | Wt 175.7 lb

## 2016-03-14 DIAGNOSIS — Z87891 Personal history of nicotine dependence: Secondary | ICD-10-CM

## 2016-03-14 DIAGNOSIS — R5383 Other fatigue: Secondary | ICD-10-CM

## 2016-03-14 DIAGNOSIS — C3491 Malignant neoplasm of unspecified part of right bronchus or lung: Secondary | ICD-10-CM

## 2016-03-14 DIAGNOSIS — R5381 Other malaise: Secondary | ICD-10-CM

## 2016-03-14 DIAGNOSIS — Z79899 Other long term (current) drug therapy: Secondary | ICD-10-CM

## 2016-03-14 DIAGNOSIS — C7931 Secondary malignant neoplasm of brain: Secondary | ICD-10-CM

## 2016-03-14 DIAGNOSIS — J910 Malignant pleural effusion: Secondary | ICD-10-CM | POA: Diagnosis not present

## 2016-03-14 DIAGNOSIS — C7802 Secondary malignant neoplasm of left lung: Secondary | ICD-10-CM

## 2016-03-14 DIAGNOSIS — Z510 Encounter for antineoplastic radiation therapy: Secondary | ICD-10-CM | POA: Diagnosis not present

## 2016-03-14 DIAGNOSIS — R531 Weakness: Secondary | ICD-10-CM

## 2016-03-14 DIAGNOSIS — M549 Dorsalgia, unspecified: Secondary | ICD-10-CM

## 2016-03-14 DIAGNOSIS — E876 Hypokalemia: Secondary | ICD-10-CM

## 2016-03-14 DIAGNOSIS — M25552 Pain in left hip: Secondary | ICD-10-CM

## 2016-03-14 DIAGNOSIS — M79602 Pain in left arm: Secondary | ICD-10-CM

## 2016-03-14 DIAGNOSIS — N289 Disorder of kidney and ureter, unspecified: Secondary | ICD-10-CM

## 2016-03-14 LAB — CBC WITH DIFFERENTIAL/PLATELET
BASOS ABS: 0.1 10*3/uL (ref 0–0.1)
BASOS PCT: 1 %
EOS PCT: 4 %
Eosinophils Absolute: 0.3 10*3/uL (ref 0–0.7)
HCT: 38.3 % (ref 35.0–47.0)
Hemoglobin: 12.8 g/dL (ref 12.0–16.0)
LYMPHS PCT: 24 %
Lymphs Abs: 1.7 10*3/uL (ref 1.0–3.6)
MCH: 27.6 pg (ref 26.0–34.0)
MCHC: 33.3 g/dL (ref 32.0–36.0)
MCV: 82.8 fL (ref 80.0–100.0)
Monocytes Absolute: 0.6 10*3/uL (ref 0.2–0.9)
Monocytes Relative: 9 %
Neutro Abs: 4.4 10*3/uL (ref 1.4–6.5)
Neutrophils Relative %: 62 %
PLATELETS: 194 10*3/uL (ref 150–440)
RBC: 4.63 MIL/uL (ref 3.80–5.20)
RDW: 13 % (ref 11.5–14.5)
WBC: 7.2 10*3/uL (ref 3.6–11.0)

## 2016-03-14 LAB — COMPREHENSIVE METABOLIC PANEL
ALBUMIN: 3.8 g/dL (ref 3.5–5.0)
ALT: 20 U/L (ref 14–54)
AST: 26 U/L (ref 15–41)
Alkaline Phosphatase: 69 U/L (ref 38–126)
Anion gap: 6 (ref 5–15)
BUN: 18 mg/dL (ref 6–20)
CHLORIDE: 101 mmol/L (ref 101–111)
CO2: 27 mmol/L (ref 22–32)
CREATININE: 0.79 mg/dL (ref 0.44–1.00)
Calcium: 9.3 mg/dL (ref 8.9–10.3)
GFR calc Af Amer: >60 mL/min (ref >60)
GFR calc non Af Amer: >60 mL/min (ref >60)
GLUCOSE: 121 mg/dL — AB (ref 65–99)
Potassium: 3.4 mmol/L — ABNORMAL LOW (ref 3.5–5.1)
SODIUM: 134 mmol/L — AB (ref 135–145)
Total Bilirubin: 0.7 mg/dL (ref 0.3–1.2)
Total Protein: 7.3 g/dL (ref 6.5–8.1)

## 2016-03-14 MED ORDER — HEPARIN SOD (PORK) LOCK FLUSH 100 UNIT/ML IV SOLN
500.0000 [IU] | Freq: Once | INTRAVENOUS | Status: AC | PRN
  Administered 2016-03-14: 500 [IU]
  Filled 2016-03-14: qty 5

## 2016-03-14 MED ORDER — SODIUM CHLORIDE 0.9 % IV SOLN
Freq: Once | INTRAVENOUS | Status: AC
  Administered 2016-03-14: 10:00:00 via INTRAVENOUS
  Filled 2016-03-14: qty 1000

## 2016-03-14 MED ORDER — SODIUM CHLORIDE 0.9% FLUSH
10.0000 mL | INTRAVENOUS | Status: DC | PRN
  Filled 2016-03-14: qty 10

## 2016-03-14 MED ORDER — SODIUM CHLORIDE 0.9 % IV SOLN
240.0000 mg | Freq: Once | INTRAVENOUS | Status: AC
  Administered 2016-03-14: 240 mg via INTRAVENOUS
  Filled 2016-03-14: qty 20

## 2016-03-14 NOTE — Progress Notes (Signed)
States is having pain in left arm today. Took tramadol this morning which makes her feel "drunk."

## 2016-03-14 NOTE — Progress Notes (Signed)
[place]  [phone]  [phone]     Patricia Kelley DOB: 1956-06-06  MR#: [id number]  LFY#:[id number]  Patient Care Team: Marguerita Merles, MD as PCP - General (Family Medicine)  CHIEF COMPLAINT:  Chief Complaint  Patient presents with  . Lung Cancer    INTERVAL HISTORY: Patient returns to clinic today for further evaluation and consideration of cycle 5 of nivolumab. Patient continues to have significant left arm pain and weakness, but states she took her tramadol this morning which has helped her symptoms improve. She also feels "drunk" after taking tramadol. She does not complain of headache today.  She otherwise feels well and is tolerating her treatments. She denies any recent fevers. She has a good appetite and denies weight loss. She denies any chest pain or tightness, hemoptysis, or shortness of breath. She denies any nausea, vomiting, diarrhea or constipation.  Patient offers no other specific complaints today.   REVIEW OF SYSTEMS:   Review of Systems  Constitutional: Positive for malaise/fatigue. Negative for fever, weight loss and diaphoresis.  Eyes: Negative.   Respiratory: Negative.   Cardiovascular: Negative for chest pain, palpitations and leg swelling.  Gastrointestinal: Negative for nausea, abdominal pain, diarrhea, blood in stool and melena.  Genitourinary: Negative.   Musculoskeletal: Positive for back pain.       Left hip pain, left arm pain  Skin: Negative.   Neurological: Positive for weakness.  Psychiatric/Behavioral: Negative.     As per HPI. Otherwise, a complete review of systems is negatve.   PAST MEDICAL HISTORY: Past Medical History  Diagnosis Date  . Lung cancer ([place])   . Hypertension   . Hypercholesteremia   . Metastatic lung cancer (metastasis from lung to other site) ([place]) 01/21/2015  . Anemia 01/21/2015  . 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 GERD (gastroesophageal reflux disease)   . Last menstrual  period (LMP) > 10 days ago 2007  . Anxiety     PAST SURGICAL HISTORY: Past Surgical History  Procedure Laterality Date  . Insertion / placement pleural catheter    . Portacath placement Right 04/26/2015    Procedure: INSERTION PORT-A-CATH;  Surgeon: Nestor Lewandowsky, MD;  Location: [facility] ORS;  Service: General;  Laterality: Right;  . Abdominal hysterectomy      partial    FAMILY HISTORY Family History  Problem Relation Age of Onset  . Cervical cancer Mother   . Uterine cancer Maternal Grandmother     GYNECOLOGIC HISTORY:  No LMP recorded. Patient has had a hysterectomy.     ADVANCED DIRECTIVES:    HEALTH MAINTENANCE: Social History  Substance Use Topics  . Smoking status: Former Smoker -- 0.25 packs/day for 8 years    Types: Cigarettes    Quit date: 07/20/2014  . Smokeless tobacco: Not on file  . Alcohol Use: No    Allergies  Allergen Reactions  . Tegaderm Ag Mesh [Silver] Other (See Comments)    blisters  . Aspirin Other (See Comments)    GIB    Current Outpatient Prescriptions  Medication Sig Dispense Refill  . ALPRAZolam (XANAX) 1 MG tablet Take 1/2 tablet 1-2x per day as needed for anxiety and take 1 tablet at night for anxiety/sleeep 45 tablet 0  . ferrous sulfate 325 (65 FE) MG tablet Take 325 mg by mouth daily with breakfast.    . lidocaine-prilocaine (EMLA) cream Apply cream 1 hour before chemotherapy treatment 30 g 1  . lisinopril-hydrochlorothiazide (PRINZIDE,ZESTORETIC) 10-12.5 MG per tablet Take 1  tablet by mouth daily.    . Multiple Vitamins-Minerals (MULTIVITAMIN WITH MINERALS) tablet Take 1 tablet by mouth daily. 30 tablet 3  . omeprazole (PRILOSEC) 20 MG capsule Take 20 mg by mouth every morning.     . potassium chloride SA (K-DUR,KLOR-CON) 20 MEQ tablet Take 1 tablet (20 mEq total) by mouth daily. 90 tablet 1  . promethazine (PHENERGAN) 25 MG tablet Take 25 mg by mouth every 6 (six) hours as needed for nausea or vomiting.    . simvastatin (ZOCOR) 20 MG  tablet Take 20 mg by mouth at bedtime.     . traMADol (ULTRAM) 50 MG tablet Take 1 tablet (50 mg total) by mouth every 6 (six) hours as needed. 90 tablet 1   No current facility-administered medications for this visit.   Facility-Administered Medications Ordered in Other Visits  Medication Dose Route Frequency Provider Last Rate Last Dose  . heparin lock flush 100 unit/mL  250 Units Intracatheter PRN Evlyn Kanner, NP   250 Units at 01/21/15 1734  . sodium chloride 0.9 % injection 10 mL  10 mL Intracatheter PRN Evlyn Kanner, NP   10 mL at 01/21/15 1734  . sodium chloride 0.9 % injection 10 mL  10 mL Intravenous PRN Leia Alf, MD   10 mL at 04/27/15 0905  . sodium chloride 0.9 % injection 3 mL  3 mL Intracatheter PRN Evlyn Kanner, NP   3 mL at 01/21/15 1731  . sodium chloride flush (NS) 0.9 % injection 10 mL  10 mL Intracatheter PRN Lloyd Huger, MD        OBJECTIVE: BP 124/81 mmHg  Pulse 66  Temp(Src) 96.6 F (35.9 C) (Tympanic)  Resp 18  Wt 175 lb 11 oz (79.69 kg)   Body mass index is 29.24 kg/(m^2).    ECOG FS:1 - Symptomatic but completely ambulatory  General: Well-developed, well-nourished, no acute distress. Eyes: Pink conjunctiva, anicteric sclera. Lungs: Clear to auscultation bilaterally. Heart: Regular rate and rhythm. No rubs, murmurs, or gallops. Musculoskeletal: No edemal, No cyanosis, or clubbing.  Neuro: Alert, answering all questions appropriately.  Skin: No rashes or petechiae noted. Psych: Normal affect.    LAB RESULTS:  Appointment on 03/14/2016  Component Date Value Ref Range Status  . WBC 03/14/2016 7.2  3.6 - 11.0 K/uL Final  . RBC 03/14/2016 4.63  3.80 - 5.20 MIL/uL Final  . Hemoglobin 03/14/2016 12.8  12.0 - 16.0 g/dL Final  . HCT 03/14/2016 38.3  35.0 - 47.0 % Final  . MCV 03/14/2016 82.8  80.0 - 100.0 fL Final  . MCH 03/14/2016 27.6  26.0 - 34.0 pg Final  . MCHC 03/14/2016 33.3  32.0 - 36.0 g/dL Final  . RDW 03/14/2016 13.0   11.5 - 14.5 % Final  . Platelets 03/14/2016 194  150 - 440 K/uL Final  . Neutrophils Relative % 03/14/2016 62   Final  . Neutro Abs 03/14/2016 4.4  1.4 - 6.5 K/uL Final  . Lymphocytes Relative 03/14/2016 24   Final  . Lymphs Abs 03/14/2016 1.7  1.0 - 3.6 K/uL Final  . Monocytes Relative 03/14/2016 9   Final  . Monocytes Absolute 03/14/2016 0.6  0.2 - 0.9 K/uL Final  . Eosinophils Relative 03/14/2016 4   Final  . Eosinophils Absolute 03/14/2016 0.3  0 - 0.7 K/uL Final  . Basophils Relative 03/14/2016 1   Final  . Basophils Absolute 03/14/2016 0.1  0 - 0.1 K/uL Final  . Sodium 03/14/2016 134* 135 -  145 mmol/L Final  . Potassium 03/14/2016 3.4* 3.5 - 5.1 mmol/L Final  . Chloride 03/14/2016 101  101 - 111 mmol/L Final  . CO2 03/14/2016 27  22 - 32 mmol/L Final  . Glucose, Bld 03/14/2016 121* 65 - 99 mg/dL Final  . BUN 03/14/2016 18  6 - 20 mg/dL Final  . Creatinine, Ser 03/14/2016 0.79  0.44 - 1.00 mg/dL Final  . Calcium 03/14/2016 9.3  8.9 - 10.3 mg/dL Final  . Total Protein 03/14/2016 7.3  6.5 - 8.1 g/dL Final  . Albumin 03/14/2016 3.8  3.5 - 5.0 g/dL Final  . AST 03/14/2016 26  15 - 41 U/L Final  . ALT 03/14/2016 20  14 - 54 U/L Final  . Alkaline Phosphatase 03/14/2016 69  38 - 126 U/L Final  . Total Bilirubin 03/14/2016 0.7  0.3 - 1.2 mg/dL Final  . GFR calc non Af Amer 03/14/2016 >60  >60 mL/min Final  . GFR calc Af Amer 03/14/2016 >60  >60 mL/min Final   Comment: (NOTE) The eGFR has been calculated using the CKD EPI equation. This calculation has not been validated in all clinical situations. eGFR's persistently <60 mL/min signify possible Chronic Kidney Disease.   . Anion gap 03/14/2016 6  5 - 15 Final    STUDIES:  IMPRESSION: 1. New and enlarging bilateral pulmonary nodules. Nodules are currently too numerous to count. An index nodule in the left lower lobe is increased from 1.2 cm diameter to 1.8 cm diameter. 2. Centrilobular emphysema with airway thickening. 3.  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 4. Small right pleural effusion, likely malignant. 5. 1.5 by 1.8 cm hyperdense exophytic left kidney upper pole lesion, complex cyst versus mass. Not visibly hypermetabolic on prior PET-CT, and with similar hyperdensity on portal venous and delayed phase images.  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 On 12/01/14 - Right Pleural Fluid Cytology. POSITIVE FOR MALIGNANT CELLS. ADENOCARCINOMA, CONSISTENT WITH LUNG ORIGIN. Molecular testing for EGFR, ALK and others is negative  PLAN:   1. Lung Cancer: Pemetrexed was discontinued in April 2017 due to progression of disease on CT scan.  Continue with nivolumab cycle 5 of 6 today. Plan to reimage approximately 4-6 weeks after the conclusion of cycle 6. Return to clinic in 2 weeks for consideration of cycle 6. 2. Headaches: MRI the brain completed on March 03, 2016 is essentially unchanged from previous.  3. Pain (back,hip, arm): MRI and bone scan results reviewed independently revealing metastatic disease in C6 there is likely causing her symptoms. Lumbar MRI revealed an enhancing lesion and L2 as well as a suspicious 12 mm lesion and right sacrum. Patient will start palliative XRT later this week. Continue tramadol as prescribed. Patient cannot take stronger narcotics secondary to "upset stomach".  4. Kidney lesion: Unclear if metastatic deposit or second primary of the left kidney. Continue to monitor closely.  5. Anxiety: Resolved. Continue Xanax 1 mg at night and 0.5 mg as needed during the day.     6. Diarrhea: Resolved. Monitor while on nivolumab.     7. Hypokalemia: Continue oral potassium supplement to 20 meq BID.   Lloyd Huger, MD 03/14/2016 12:56 PM

## 2016-03-15 ENCOUNTER — Ambulatory Visit
Admission: RE | Admit: 2016-03-15 | Discharge: 2016-03-15 | Disposition: A | Discharge status: Home / Self Care | Payer: BLUE CROSS/BLUE SHIELD | Source: Ambulatory Visit | Attending: Radiation Oncology | Admitting: Radiation Oncology

## 2016-03-15 DIAGNOSIS — Z510 Encounter for antineoplastic radiation therapy: Secondary | ICD-10-CM | POA: Diagnosis not present

## 2016-03-16 ENCOUNTER — Ambulatory Visit
Admission: RE | Admit: 2016-03-16 | Discharge: 2016-03-16 | Disposition: A | Discharge status: Home / Self Care | Payer: BLUE CROSS/BLUE SHIELD | Source: Ambulatory Visit | Attending: Radiation Oncology | Admitting: Radiation Oncology

## 2016-03-16 DIAGNOSIS — Z510 Encounter for antineoplastic radiation therapy: Secondary | ICD-10-CM | POA: Diagnosis not present

## 2016-03-17 ENCOUNTER — Ambulatory Visit
Admission: RE | Admit: 2016-03-17 | Discharge: 2016-03-17 | Disposition: A | Discharge status: Home / Self Care | Payer: BLUE CROSS/BLUE SHIELD | Source: Ambulatory Visit | Attending: Radiation Oncology | Admitting: Radiation Oncology

## 2016-03-17 DIAGNOSIS — Z510 Encounter for antineoplastic radiation therapy: Secondary | ICD-10-CM | POA: Diagnosis not present

## 2016-03-20 ENCOUNTER — Ambulatory Visit
Admission: RE | Admit: 2016-03-20 | Discharge: 2016-03-20 | Disposition: A | Discharge status: Home / Self Care | Payer: BLUE CROSS/BLUE SHIELD | Source: Ambulatory Visit | Attending: Radiation Oncology | Admitting: Radiation Oncology

## 2016-03-20 ENCOUNTER — Other Ambulatory Visit: Payer: Self-pay | Admitting: *Deleted

## 2016-03-20 DIAGNOSIS — Z510 Encounter for antineoplastic radiation therapy: Secondary | ICD-10-CM | POA: Diagnosis not present

## 2016-03-22 ENCOUNTER — Ambulatory Visit
Admission: RE | Admit: 2016-03-22 | Discharge: 2016-03-22 | Disposition: A | Discharge status: Home / Self Care | Payer: BLUE CROSS/BLUE SHIELD | Source: Ambulatory Visit | Attending: Radiation Oncology | Admitting: Radiation Oncology

## 2016-03-22 DIAGNOSIS — Z510 Encounter for antineoplastic radiation therapy: Secondary | ICD-10-CM | POA: Diagnosis not present

## 2016-03-23 ENCOUNTER — Ambulatory Visit
Admission: RE | Admit: 2016-03-23 | Discharge: 2016-03-23 | Disposition: A | Discharge status: Home / Self Care | Payer: BLUE CROSS/BLUE SHIELD | Source: Ambulatory Visit | Attending: Radiation Oncology | Admitting: Radiation Oncology

## 2016-03-23 DIAGNOSIS — Z510 Encounter for antineoplastic radiation therapy: Secondary | ICD-10-CM | POA: Diagnosis not present

## 2016-03-24 ENCOUNTER — Ambulatory Visit
Admission: RE | Admit: 2016-03-24 | Discharge: 2016-03-24 | Disposition: A | Discharge status: Home / Self Care | Payer: BLUE CROSS/BLUE SHIELD | Source: Ambulatory Visit | Attending: Radiation Oncology | Admitting: Radiation Oncology

## 2016-03-24 DIAGNOSIS — Z510 Encounter for antineoplastic radiation therapy: Secondary | ICD-10-CM | POA: Diagnosis not present

## 2016-03-27 ENCOUNTER — Ambulatory Visit
Admission: RE | Admit: 2016-03-27 | Discharge: 2016-03-27 | Disposition: A | Discharge status: Home / Self Care | Payer: BLUE CROSS/BLUE SHIELD | Source: Ambulatory Visit | Attending: Radiation Oncology | Admitting: Radiation Oncology

## 2016-03-27 DIAGNOSIS — Z510 Encounter for antineoplastic radiation therapy: Secondary | ICD-10-CM | POA: Diagnosis not present

## 2016-03-28 ENCOUNTER — Inpatient Hospital Stay: Payer: BLUE CROSS/BLUE SHIELD | Attending: Oncology

## 2016-03-28 ENCOUNTER — Inpatient Hospital Stay: Payer: BLUE CROSS/BLUE SHIELD

## 2016-03-28 ENCOUNTER — Ambulatory Visit
Admission: RE | Admit: 2016-03-28 | Discharge: 2016-03-28 | Disposition: A | Discharge status: Home / Self Care | Payer: BLUE CROSS/BLUE SHIELD | Source: Ambulatory Visit | Attending: Radiation Oncology | Admitting: Radiation Oncology

## 2016-03-28 ENCOUNTER — Inpatient Hospital Stay (HOSPITAL_BASED_OUTPATIENT_CLINIC_OR_DEPARTMENT_OTHER): Payer: BLUE CROSS/BLUE SHIELD | Admitting: Oncology

## 2016-03-28 VITALS — BP 111/79 | HR 88 | Temp 97.6°F | Wt 172.0 lb

## 2016-03-28 DIAGNOSIS — R5383 Other fatigue: Secondary | ICD-10-CM | POA: Diagnosis not present

## 2016-03-28 DIAGNOSIS — C3491 Malignant neoplasm of unspecified part of right bronchus or lung: Secondary | ICD-10-CM

## 2016-03-28 DIAGNOSIS — M25559 Pain in unspecified hip: Secondary | ICD-10-CM | POA: Diagnosis not present

## 2016-03-28 DIAGNOSIS — J432 Centrilobular emphysema: Secondary | ICD-10-CM | POA: Diagnosis not present

## 2016-03-28 DIAGNOSIS — M549 Dorsalgia, unspecified: Secondary | ICD-10-CM | POA: Insufficient documentation

## 2016-03-28 DIAGNOSIS — R5381 Other malaise: Secondary | ICD-10-CM | POA: Insufficient documentation

## 2016-03-28 DIAGNOSIS — Z5112 Encounter for antineoplastic immunotherapy: Secondary | ICD-10-CM | POA: Insufficient documentation

## 2016-03-28 DIAGNOSIS — M899 Disorder of bone, unspecified: Secondary | ICD-10-CM

## 2016-03-28 DIAGNOSIS — N289 Disorder of kidney and ureter, unspecified: Secondary | ICD-10-CM | POA: Diagnosis not present

## 2016-03-28 DIAGNOSIS — C7931 Secondary malignant neoplasm of brain: Secondary | ICD-10-CM | POA: Insufficient documentation

## 2016-03-28 DIAGNOSIS — R531 Weakness: Secondary | ICD-10-CM

## 2016-03-28 DIAGNOSIS — I10 Essential (primary) hypertension: Secondary | ICD-10-CM | POA: Diagnosis not present

## 2016-03-28 DIAGNOSIS — C7802 Secondary malignant neoplasm of left lung: Secondary | ICD-10-CM | POA: Insufficient documentation

## 2016-03-28 DIAGNOSIS — Z87891 Personal history of nicotine dependence: Secondary | ICD-10-CM | POA: Insufficient documentation

## 2016-03-28 DIAGNOSIS — M79602 Pain in left arm: Secondary | ICD-10-CM | POA: Insufficient documentation

## 2016-03-28 DIAGNOSIS — Z79899 Other long term (current) drug therapy: Secondary | ICD-10-CM | POA: Insufficient documentation

## 2016-03-28 DIAGNOSIS — C3480 Malignant neoplasm of overlapping sites of unspecified bronchus and lung: Secondary | ICD-10-CM

## 2016-03-28 DIAGNOSIS — Z510 Encounter for antineoplastic radiation therapy: Secondary | ICD-10-CM | POA: Diagnosis not present

## 2016-03-28 DIAGNOSIS — C7951 Secondary malignant neoplasm of bone: Secondary | ICD-10-CM | POA: Diagnosis not present

## 2016-03-28 DIAGNOSIS — E7800 Pure hypercholesterolemia, unspecified: Secondary | ICD-10-CM | POA: Insufficient documentation

## 2016-03-28 DIAGNOSIS — E876 Hypokalemia: Secondary | ICD-10-CM | POA: Insufficient documentation

## 2016-03-28 DIAGNOSIS — J910 Malignant pleural effusion: Secondary | ICD-10-CM

## 2016-03-28 DIAGNOSIS — K219 Gastro-esophageal reflux disease without esophagitis: Secondary | ICD-10-CM | POA: Diagnosis not present

## 2016-03-28 LAB — CBC WITH DIFFERENTIAL/PLATELET
BASOS ABS: 0.1 10*3/uL (ref 0–0.1)
BASOS PCT: 1 %
Eosinophils Absolute: 0.4 10*3/uL (ref 0–0.7)
Eosinophils Relative: 5 %
HEMATOCRIT: 37.9 % (ref 35.0–47.0)
HEMOGLOBIN: 12.8 g/dL (ref 12.0–16.0)
LYMPHS PCT: 25 %
Lymphs Abs: 1.9 10*3/uL (ref 1.0–3.6)
MCH: 27.5 pg (ref 26.0–34.0)
MCHC: 33.9 g/dL (ref 32.0–36.0)
MCV: 81.3 fL (ref 80.0–100.0)
MONO ABS: 0.6 10*3/uL (ref 0.2–0.9)
MONOS PCT: 8 %
NEUTROS ABS: 4.6 10*3/uL (ref 1.4–6.5)
NEUTROS PCT: 61 %
PLATELETS: 182 10*3/uL (ref 150–440)
RBC: 4.66 MIL/uL (ref 3.80–5.20)
RDW: 12.7 % (ref 11.5–14.5)
WBC: 7.6 10*3/uL (ref 3.6–11.0)

## 2016-03-28 LAB — COMPREHENSIVE METABOLIC PANEL
ALT: 18 U/L (ref 14–54)
AST: 28 U/L (ref 15–41)
Albumin: 3.8 g/dL (ref 3.5–5.0)
Alkaline Phosphatase: 59 U/L (ref 38–126)
Anion gap: 7 (ref 5–15)
BUN: 18 mg/dL (ref 6–20)
CHLORIDE: 104 mmol/L (ref 101–111)
CO2: 27 mmol/L (ref 22–32)
CREATININE: 0.84 mg/dL (ref 0.44–1.00)
Calcium: 9.7 mg/dL (ref 8.9–10.3)
GFR calc non Af Amer: >60 mL/min (ref >60)
Glucose, Bld: 120 mg/dL — ABNORMAL HIGH (ref 65–99)
POTASSIUM: 3.7 mmol/L (ref 3.5–5.1)
SODIUM: 138 mmol/L (ref 135–145)
Total Bilirubin: 0.8 mg/dL (ref 0.3–1.2)
Total Protein: 7.1 g/dL (ref 6.5–8.1)

## 2016-03-28 MED ORDER — SODIUM CHLORIDE 0.9 % IV SOLN
Freq: Once | INTRAVENOUS | Status: AC
  Administered 2016-03-28: 11:00:00 via INTRAVENOUS
  Filled 2016-03-28: qty 1000

## 2016-03-28 MED ORDER — HEPARIN SOD (PORK) LOCK FLUSH 100 UNIT/ML IV SOLN
500.0000 [IU] | Freq: Once | INTRAVENOUS | Status: AC
  Administered 2016-03-28: 500 [IU] via INTRAVENOUS

## 2016-03-28 MED ORDER — SODIUM CHLORIDE 0.9% FLUSH
10.0000 mL | INTRAVENOUS | Status: DC | PRN
  Administered 2016-03-28: 10 mL via INTRAVENOUS
  Filled 2016-03-28: qty 10

## 2016-03-28 MED ORDER — HEPARIN SOD (PORK) LOCK FLUSH 100 UNIT/ML IV SOLN
INTRAVENOUS | Status: AC
  Filled 2016-03-28: qty 5

## 2016-03-28 MED ORDER — NIVOLUMAB CHEMO INJECTION 100 MG/10ML
240.0000 mg | Freq: Once | INTRAVENOUS | Status: AC
  Administered 2016-03-28: 240 mg via INTRAVENOUS
  Filled 2016-03-28: qty 8

## 2016-03-28 MED ORDER — OXYCODONE HCL 5 MG PO TABS: 5.0000 mg | ORAL_TABLET | Freq: Three times a day (TID) | ORAL | Status: DC | PRN

## 2016-03-28 NOTE — Progress Notes (Deleted)
Metastatic lung cancer (metastasis from lung to other site) [facility])   Staging form: Lung, AJCC 7th Edition     Clinical: Stage Unknown ([place], NX, M1) - Signed by Evlyn Kanner, NP on 01/21/2015

## 2016-03-28 NOTE — Progress Notes (Signed)
Patient here today for ongoing follow up and treatment consideration regarding metastatic lung cancer. Patient reports increased left arm pain, currently taking Tramadol.

## 2016-03-28 NOTE — Progress Notes (Signed)
[place]  [phone]) [phone]  Fax:(336) [place] DOB: [birth year]  MR#: [id number]  BDZ#:[id number]  Patient Care Team: Marguerita Merles, MD as PCP - General (Family Medicine)  CHIEF COMPLAINT: Stage IV right lung non-small cell carcinoma with malignant pleural effusion and bilateral lung metastasis  INTERVAL HISTORY: Patient returns to clinic today for further evaluation and consideration of cycle 6 of nivolumab. Patient continues to have significant left arm pain and weakness. She states tramadol no longer works and is willing to try a narcotic. She does not complain of headache today.  She otherwise feels well and is tolerating her treatments. She denies any recent fevers. She has a good appetite and denies weight loss. She denies any chest pain or tightness, hemoptysis, or shortness of breath. She denies any nausea, vomiting, diarrhea or constipation.  Patient offers no other specific complaints today.   REVIEW OF SYSTEMS:   Review of Systems  Constitutional: Positive for malaise/fatigue. Negative for fever, weight loss and diaphoresis.  Eyes: Negative.   Respiratory: Negative.   Cardiovascular: Negative for chest pain, palpitations and leg swelling.  Gastrointestinal: Negative for nausea, abdominal pain, diarrhea, blood in stool and melena.  Genitourinary: Negative.   Musculoskeletal: Positive for back pain.       Left arm pain  Skin: Negative.   Neurological: Positive for weakness.  Psychiatric/Behavioral: Negative.     As per HPI. Otherwise, a complete review of systems is negatve.   PAST MEDICAL HISTORY: Past Medical History  Diagnosis Date  . Lung cancer ([place])   . Hypertension   . Hypercholesteremia   . Metastatic lung cancer (metastasis from lung to other site) ([place]) 01/21/2015  . Anemia 01/21/2015  . 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 GERD (gastroesophageal reflux disease)   . Last menstrual period (LMP) > 10 days ago  2007  . Anxiety     PAST SURGICAL HISTORY: Past Surgical History  Procedure Laterality Date  . Insertion / placement pleural catheter    . Portacath placement Right 04/26/2015    Procedure: INSERTION PORT-A-CATH;  Surgeon: Nestor Lewandowsky, MD;  Location: [facility] ORS;  Service: General;  Laterality: Right;  . Abdominal hysterectomy      partial    FAMILY HISTORY Family History  Problem Relation Age of Onset  . Cervical cancer Mother   . Uterine cancer Maternal Grandmother     GYNECOLOGIC HISTORY:  No LMP recorded. Patient has had a hysterectomy.     ADVANCED DIRECTIVES:    HEALTH MAINTENANCE: Social History  Substance Use Topics  . Smoking status: Former Smoker -- 0.25 packs/day for 8 years    Types: Cigarettes    Quit date: 07/20/2014  . Smokeless tobacco: Not on file  . Alcohol Use: No    Allergies  Allergen Reactions  . Tegaderm Ag Mesh [Silver] Other (See Comments)    blisters  . Aspirin Other (See Comments)    GIB    Current Outpatient Prescriptions  Medication Sig Dispense Refill  . ALPRAZolam (XANAX) 1 MG tablet Take 1/2 tablet 1-2x per day as needed for anxiety and take 1 tablet at night for anxiety/sleeep 45 tablet 0  . lidocaine-prilocaine (EMLA) cream Apply cream 1 hour before chemotherapy treatment 30 g 1  . lisinopril-hydrochlorothiazide (PRINZIDE,ZESTORETIC) 10-12.5 MG per tablet Take 1 tablet by mouth daily.    . Multiple Vitamins-Minerals (MULTIVITAMIN WITH MINERALS) tablet Take 1 tablet by mouth daily. 30 tablet 3  . omeprazole (PRILOSEC) 20  MG capsule Take 20 mg by mouth every morning.     . potassium chloride SA (K-DUR,KLOR-CON) 20 MEQ tablet Take 1 tablet (20 mEq total) by mouth daily. 90 tablet 1  . promethazine (PHENERGAN) 25 MG tablet Take 25 mg by mouth every 6 (six) hours as needed for nausea or vomiting.    . simvastatin (ZOCOR) 20 MG tablet Take 20 mg by mouth at bedtime.     . traMADol (ULTRAM) 50 MG tablet Take 1 tablet (50 mg total) by mouth  every 6 (six) hours as needed. 90 tablet 1  . ferrous sulfate 325 (65 FE) MG tablet Take 325 mg by mouth daily with breakfast. Reported on 03/28/2016    . oxyCODONE (OXY IR/ROXICODONE) 5 MG immediate release tablet Take 1 tablet (5 mg total) by mouth 3 (three) times daily as needed for severe pain. 30 tablet 0   No current facility-administered medications for this visit.   Facility-Administered Medications Ordered in Other Visits  Medication Dose Route Frequency Provider Last Rate Last Dose  . heparin lock flush 100 unit/mL  250 Units Intracatheter PRN Evlyn Kanner, NP   250 Units at 01/21/15 1734  . sodium chloride 0.9 % injection 10 mL  10 mL Intracatheter PRN Evlyn Kanner, NP   10 mL at 01/21/15 1734  . sodium chloride 0.9 % injection 10 mL  10 mL Intravenous PRN Leia Alf, MD   10 mL at 04/27/15 0905  . sodium chloride 0.9 % injection 3 mL  3 mL Intracatheter PRN Evlyn Kanner, NP   3 mL at 01/21/15 1731    OBJECTIVE: BP 111/79 mmHg  Pulse 88  Temp(Src) 97.6 F (36.4 C) (Tympanic)  Wt 172 lb (78.019 kg)   Body mass index is 28.62 kg/(m^2).    ECOG FS:1 - Symptomatic but completely ambulatory  General: Well-developed, well-nourished, no acute distress. Eyes: Pink conjunctiva, anicteric sclera. Lungs: Clear to auscultation bilaterally. Heart: Regular rate and rhythm. No rubs, murmurs, or gallops. Musculoskeletal: No edemal, No cyanosis, or clubbing.  Neuro: Alert, answering all questions appropriately.  Skin: No rashes or petechiae noted. Psych: Normal affect.    LAB RESULTS:  Clinical Support on 03/28/2016  Component Date Value Ref Range Status  . WBC 03/28/2016 7.6  3.6 - 11.0 K/uL Final  . RBC 03/28/2016 4.66  3.80 - 5.20 MIL/uL Final  . Hemoglobin 03/28/2016 12.8  12.0 - 16.0 g/dL Final  . HCT 03/28/2016 37.9  35.0 - 47.0 % Final  . MCV 03/28/2016 81.3  80.0 - 100.0 fL Final  . MCH 03/28/2016 27.5  26.0 - 34.0 pg Final  . MCHC 03/28/2016 33.9  32.0 -  36.0 g/dL Final  . RDW 03/28/2016 12.7  11.5 - 14.5 % Final  . Platelets 03/28/2016 182  150 - 440 K/uL Final  . Neutrophils Relative % 03/28/2016 61   Final  . Neutro Abs 03/28/2016 4.6  1.4 - 6.5 K/uL Final  . Lymphocytes Relative 03/28/2016 25   Final  . Lymphs Abs 03/28/2016 1.9  1.0 - 3.6 K/uL Final  . Monocytes Relative 03/28/2016 8   Final  . Monocytes Absolute 03/28/2016 0.6  0.2 - 0.9 K/uL Final  . Eosinophils Relative 03/28/2016 5   Final  . Eosinophils Absolute 03/28/2016 0.4  0 - 0.7 K/uL Final  . Basophils Relative 03/28/2016 1   Final  . Basophils Absolute 03/28/2016 0.1  0 - 0.1 K/uL Final  . Sodium 03/28/2016 138  135 - 145 mmol/L  Final  . Potassium 03/28/2016 3.7  3.5 - 5.1 mmol/L Final  . Chloride 03/28/2016 104  101 - 111 mmol/L Final  . CO2 03/28/2016 27  22 - 32 mmol/L Final  . Glucose, Bld 03/28/2016 120* 65 - 99 mg/dL Final  . BUN 03/28/2016 18  6 - 20 mg/dL Final  . Creatinine, Ser 03/28/2016 0.84  0.44 - 1.00 mg/dL Final  . Calcium 03/28/2016 9.7  8.9 - 10.3 mg/dL Final  . Total Protein 03/28/2016 7.1  6.5 - 8.1 g/dL Final  . Albumin 03/28/2016 3.8  3.5 - 5.0 g/dL Final  . AST 03/28/2016 28  15 - 41 U/L Final  . ALT 03/28/2016 18  14 - 54 U/L Final  . Alkaline Phosphatase 03/28/2016 59  38 - 126 U/L Final  . Total Bilirubin 03/28/2016 0.8  0.3 - 1.2 mg/dL Final  . GFR calc non Af Amer 03/28/2016 >60  >60 mL/min Final  . GFR calc Af Amer 03/28/2016 >60  >60 mL/min Final   Comment: (NOTE) The eGFR has been calculated using the CKD EPI equation. This calculation has not been validated in all clinical situations. eGFR's persistently <60 mL/min signify possible Chronic Kidney Disease.   . Anion gap 03/28/2016 7  5 - 15 Final    STUDIES:  IMPRESSION: 1. New and enlarging bilateral pulmonary nodules. Nodules are currently too numerous to count. An index nodule in the left lower lobe is increased from 1.2 cm diameter to 1.8 cm diameter. 2. Centrilobular  emphysema with airway thickening. 3. 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 4. Small right pleural effusion, likely malignant. 5. 1.5 by 1.8 cm hyperdense exophytic left kidney upper pole lesion, complex cyst versus mass. Not visibly hypermetabolic on prior PET-CT, and with similar hyperdensity on portal venous and delayed phase images.  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 On 12/01/14 - Right Pleural Fluid Cytology. POSITIVE FOR MALIGNANT CELLS. ADENOCARCINOMA, CONSISTENT WITH LUNG ORIGIN. Molecular testing for EGFR, ALK and others is negative  PLAN:   1. 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  Continue with cycle 6 of nivolumab today. Will reimage in 1-2 weeks prior to her next infusion. Return to clinic in 2 weeks for consideration of cycle 7. 2. Headaches: MRI the brain completed on March 03, 2016 is essentially unchanged from previous.  3. Pain (back,hip, arm): MRI and bone scan results reviewed independently revealing metastatic disease in C6 there is likely causing her symptoms. Lumbar MRI revealed an enhancing lesion and L2 as well as a suspicious 12 mm lesion and right sacrum. Continue palliative XRT later this week. Patient states tramadol no longer works and has agreed to try a narcotic. She is given a prescription for 10 mg oxycodone.  4. Kidney lesion: Unclear if metastatic deposit or second primary of the left kidney. Continue to monitor closely.  5. Anxiety: Resolved. Continue Xanax 1 mg at night and 0.5 mg as needed during the day.     6. Diarrhea: Resolved. Monitor while on nivolumab.     7. Hypokalemia: Continue oral potassium supplement to 20 meq BID.     8.  Bony lesions: Consider Zometa in the future.   Lloyd Huger, MD 03/28/2016 5:06 PM

## 2016-03-29 ENCOUNTER — Other Ambulatory Visit: Payer: Self-pay | Admitting: *Deleted

## 2016-03-29 ENCOUNTER — Ambulatory Visit
Admission: RE | Admit: 2016-03-29 | Discharge: 2016-03-29 | Disposition: A | Discharge status: Home / Self Care | Payer: BLUE CROSS/BLUE SHIELD | Source: Ambulatory Visit | Attending: Radiation Oncology | Admitting: Radiation Oncology

## 2016-03-29 DIAGNOSIS — Z510 Encounter for antineoplastic radiation therapy: Secondary | ICD-10-CM | POA: Diagnosis not present

## 2016-03-29 LAB — THYROID PANEL WITH TSH
Free Thyroxine Index: 2.2 (ref 1.2–4.9)
T3 UPTAKE RATIO: 27 % (ref 24–39)
T4 TOTAL: 8 ug/dL (ref 4.5–12.0)
TSH: 0.683 u[IU]/mL (ref 0.450–4.500)

## 2016-04-05 ENCOUNTER — Other Ambulatory Visit: Payer: Self-pay | Admitting: Oncology

## 2016-04-05 ENCOUNTER — Ambulatory Visit
Admission: RE | Admit: 2016-04-05 | Discharge: 2016-04-05 | Disposition: A | Discharge status: Home / Self Care | Payer: BLUE CROSS/BLUE SHIELD | Source: Ambulatory Visit | Attending: Oncology | Admitting: Oncology

## 2016-04-05 DIAGNOSIS — C7802 Secondary malignant neoplasm of left lung: Secondary | ICD-10-CM | POA: Insufficient documentation

## 2016-04-05 DIAGNOSIS — C7951 Secondary malignant neoplasm of bone: Secondary | ICD-10-CM | POA: Diagnosis not present

## 2016-04-05 DIAGNOSIS — C3491 Malignant neoplasm of unspecified part of right bronchus or lung: Secondary | ICD-10-CM | POA: Diagnosis present

## 2016-04-05 DIAGNOSIS — J90 Pleural effusion, not elsewhere classified: Secondary | ICD-10-CM | POA: Insufficient documentation

## 2016-04-05 DIAGNOSIS — C7801 Secondary malignant neoplasm of right lung: Secondary | ICD-10-CM | POA: Diagnosis not present

## 2016-04-05 LAB — GLUCOSE, CAPILLARY: Glucose-Capillary: 96 mg/dL (ref 65–99)

## 2016-04-05 MED ORDER — FLUDEOXYGLUCOSE F - 18 (FDG) INJECTION
12.5200 | Freq: Once | INTRAVENOUS | Status: AC | PRN
  Administered 2016-04-05: 12.52 via INTRAVENOUS

## 2016-04-06 ENCOUNTER — Other Ambulatory Visit: Payer: Self-pay | Admitting: *Deleted

## 2016-04-06 MED ORDER — ALPRAZOLAM 1 MG PO TABS: ORAL_TABLET | ORAL | Status: DC

## 2016-04-11 ENCOUNTER — Inpatient Hospital Stay: Payer: BLUE CROSS/BLUE SHIELD

## 2016-04-11 ENCOUNTER — Inpatient Hospital Stay (HOSPITAL_BASED_OUTPATIENT_CLINIC_OR_DEPARTMENT_OTHER): Payer: BLUE CROSS/BLUE SHIELD | Admitting: Oncology

## 2016-04-11 VITALS — BP 114/78 | HR 123 | Temp 97.5°F | Wt 163.8 lb

## 2016-04-11 DIAGNOSIS — M549 Dorsalgia, unspecified: Secondary | ICD-10-CM

## 2016-04-11 DIAGNOSIS — J432 Centrilobular emphysema: Secondary | ICD-10-CM

## 2016-04-11 DIAGNOSIS — M79602 Pain in left arm: Secondary | ICD-10-CM

## 2016-04-11 DIAGNOSIS — C7931 Secondary malignant neoplasm of brain: Secondary | ICD-10-CM | POA: Diagnosis not present

## 2016-04-11 DIAGNOSIS — N289 Disorder of kidney and ureter, unspecified: Secondary | ICD-10-CM

## 2016-04-11 DIAGNOSIS — J910 Malignant pleural effusion: Secondary | ICD-10-CM

## 2016-04-11 DIAGNOSIS — C7951 Secondary malignant neoplasm of bone: Secondary | ICD-10-CM

## 2016-04-11 DIAGNOSIS — C7802 Secondary malignant neoplasm of left lung: Secondary | ICD-10-CM

## 2016-04-11 DIAGNOSIS — E876 Hypokalemia: Secondary | ICD-10-CM

## 2016-04-11 DIAGNOSIS — R5381 Other malaise: Secondary | ICD-10-CM

## 2016-04-11 DIAGNOSIS — Z87891 Personal history of nicotine dependence: Secondary | ICD-10-CM

## 2016-04-11 DIAGNOSIS — R531 Weakness: Secondary | ICD-10-CM

## 2016-04-11 DIAGNOSIS — C3491 Malignant neoplasm of unspecified part of right bronchus or lung: Secondary | ICD-10-CM | POA: Diagnosis not present

## 2016-04-11 DIAGNOSIS — M25559 Pain in unspecified hip: Secondary | ICD-10-CM

## 2016-04-11 DIAGNOSIS — R5383 Other fatigue: Secondary | ICD-10-CM

## 2016-04-11 LAB — COMPREHENSIVE METABOLIC PANEL
ALK PHOS: 57 U/L (ref 38–126)
ALT: 17 U/L (ref 14–54)
ANION GAP: 7 (ref 5–15)
AST: 27 U/L (ref 15–41)
Albumin: 3.8 g/dL (ref 3.5–5.0)
BILIRUBIN TOTAL: 0.7 mg/dL (ref 0.3–1.2)
BUN: 15 mg/dL (ref 6–20)
CALCIUM: 9.1 mg/dL (ref 8.9–10.3)
CO2: 27 mmol/L (ref 22–32)
CREATININE: 0.64 mg/dL (ref 0.44–1.00)
Chloride: 106 mmol/L (ref 101–111)
Glucose, Bld: 150 mg/dL — ABNORMAL HIGH (ref 65–99)
Potassium: 3.4 mmol/L — ABNORMAL LOW (ref 3.5–5.1)
Sodium: 140 mmol/L (ref 135–145)
TOTAL PROTEIN: 7.2 g/dL (ref 6.5–8.1)

## 2016-04-11 LAB — CBC WITH DIFFERENTIAL/PLATELET
Basophils Absolute: 0.1 10*3/uL (ref 0–0.1)
Basophils Relative: 1 %
EOS ABS: 0.4 10*3/uL (ref 0–0.7)
Eosinophils Relative: 4 %
HEMATOCRIT: 39.8 % (ref 35.0–47.0)
HEMOGLOBIN: 13.3 g/dL (ref 12.0–16.0)
LYMPHS ABS: 1.4 10*3/uL (ref 1.0–3.6)
LYMPHS PCT: 16 %
MCH: 26.9 pg (ref 26.0–34.0)
MCHC: 33.4 g/dL (ref 32.0–36.0)
MCV: 80.6 fL (ref 80.0–100.0)
MONOS PCT: 7 %
Monocytes Absolute: 0.6 10*3/uL (ref 0.2–0.9)
NEUTROS PCT: 72 %
Neutro Abs: 6 10*3/uL (ref 1.4–6.5)
Platelets: 185 10*3/uL (ref 150–440)
RBC: 4.95 MIL/uL (ref 3.80–5.20)
RDW: 13.1 % (ref 11.5–14.5)
WBC: 8.4 10*3/uL (ref 3.6–11.0)

## 2016-04-11 MED ORDER — HEPARIN SOD (PORK) LOCK FLUSH 100 UNIT/ML IV SOLN
INTRAVENOUS | Status: AC
  Filled 2016-04-11: qty 5

## 2016-04-12 ENCOUNTER — Telehealth: Payer: Self-pay | Admitting: Internal Medicine

## 2016-04-12 NOTE — Telephone Encounter (Signed)
Called re: nosebleed x2; stopped sec to cold cloth. Recommend vaseline/ saline spray; if not improved recommend ENt eval/ go to ER if happens middle of night. Dr.B

## 2016-04-13 NOTE — Progress Notes (Signed)
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 Her performance status is declining she complains of increased weakness and fatigue. She continues to have significant left arm pain and weakness. She does not complain of headache today.  She otherwise feels well. She denies any recent fevers. She has a good appetite and denies weight loss. She denies any chest pain or tightness, hemoptysis, or shortness of breath. She denies any nausea, vomiting, diarrhea or constipation.  Patient offers no further specific complaints today.   REVIEW OF SYSTEMS:   Review of Systems  Constitutional: Positive for malaise/fatigue. Negative for diaphoresis, fever and weight loss.  Eyes: Negative.   Respiratory: Negative.   Cardiovascular: Negative for chest pain, palpitations and leg swelling.  Gastrointestinal: Negative for abdominal pain, blood in stool, diarrhea, melena and nausea.  Genitourinary: Negative.   Musculoskeletal: Positive for back pain.       Left arm pain  Skin: Negative.   Neurological: Positive for weakness.  Psychiatric/Behavioral: Negative.     As per HPI. Otherwise, a complete review of systems is negatve.   PAST MEDICAL HISTORY: Past Medical History:  Diagnosis Date  . Anemia 01/21/2015  . 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 Anxiety   . GERD (gastroesophageal reflux disease)   . Hypercholesteremia   . Hypertension   . Last menstrual period (LMP) > 10 days ago 2007  . Lung cancer ([place])   . Metastatic lung cancer (metastasis from lung to other site)  [facility]) 01/21/2015    PAST SURGICAL HISTORY: Past Surgical History:  Procedure Laterality Date  . ABDOMINAL HYSTERECTOMY     partial  . INSERTION / PLACEMENT PLEURAL CATHETER    . PORTACATH PLACEMENT Right 04/26/2015   Procedure: INSERTION PORT-A-CATH;  Surgeon: Nestor Lewandowsky, MD;  Location: [facility] ORS;  Service: General;  Laterality: Right;    FAMILY HISTORY Family History  Problem Relation Age of Onset  . Cervical cancer Mother   . Uterine cancer Maternal Grandmother     GYNECOLOGIC HISTORY:  No LMP recorded. Patient has had a hysterectomy.     ADVANCED DIRECTIVES:    HEALTH MAINTENANCE: Social History  Substance Use Topics  . Smoking status: Former Smoker    Packs/day: 0.25    Years: 8.00    Types: Cigarettes    Quit date: 07/20/2014  . Smokeless tobacco: Not on file  . Alcohol use No    Allergies  Allergen Reactions  . Tegaderm Ag Mesh [Silver] Other (See Comments)    blisters  . Aspirin Other (See Comments)    GIB    Current Outpatient Prescriptions  Medication Sig Dispense Refill  . ALPRAZolam (XANAX) 1 MG tablet TAKE 1/2 BY MOUTH 1 TO 2 TIMES DAILY AS NEEDED FOR ANXIETY AND TAKE 1 BY MOUTH AT NIGHT FOR ANXIETY/SLEEP 45 tablet 0  . ferrous sulfate 325 (65 FE) MG tablet Take by mouth.    . lidocaine-prilocaine (EMLA) cream Apply cream 1 hour before chemotherapy treatment 30 g 1  . lisinopril-hydrochlorothiazide (PRINZIDE,ZESTORETIC) 10-12.5 MG per tablet Take 1 tablet by mouth daily.    . Multiple Vitamins-Minerals (MULTIVITAMIN WITH MINERALS)  tablet Take 1 tablet by mouth daily. 30 tablet 3  . omeprazole (PRILOSEC) 20 MG capsule Take 20 mg by mouth every morning.     [name] oxyCODONE (OXY IR/ROXICODONE) 5 MG immediate release tablet Take 1 tablet (5 mg total) by mouth 3 (three) times daily as needed for severe pain. 30 tablet 0  . potassium chloride SA (K-DUR,KLOR-CON) 20 MEQ tablet Take 1 tablet (20 mEq total) by mouth daily. 90 tablet 1  . simvastatin (ZOCOR) 20 MG  tablet Take 20 mg by mouth at bedtime.     . traMADol (ULTRAM) 50 MG tablet Take 1 tablet (50 mg total) by mouth every 6 (six) hours as needed. 90 tablet 1   No current facility-administered medications for this visit.    Facility-Administered Medications Ordered in Other Visits  Medication Dose Route Frequency Provider Last Rate Last Dose  . heparin lock flush 100 unit/mL  250 Units Intracatheter PRN Evlyn Kanner, NP      . sodium chloride 0.9 % injection 10 mL  10 mL Intracatheter PRN Evlyn Kanner, NP      . sodium chloride 0.9 % injection 10 mL  10 mL Intravenous PRN Leia Alf, MD   10 mL at 04/27/15 0905  . sodium chloride 0.9 % injection 3 mL  3 mL Intracatheter PRN Evlyn Kanner, NP        OBJECTIVE: BP 114/78 (BP Location: Right Arm)   Pulse (!) 123   Temp 97.5 F (36.4 C)   Wt 163 lb 12.8 oz (74.3 kg)   BMI 27.26 kg/m    Body mass index is 27.26 kg/m.    ECOG FS:2 - Symptomatic, <50% confined to bed  General: Well-developed, well-nourished, no acute distress. Eyes: Pink conjunctiva, anicteric sclera. Lungs: Clear to auscultation bilaterally. Heart: Regular rate and rhythm. No rubs, murmurs, or gallops. Musculoskeletal: No edemal, No cyanosis, or clubbing.  Neuro: Alert, answering all questions appropriately.  Skin: No rashes or petechiae noted. Psych: Normal affect.    LAB RESULTS:  Appointment on 04/11/2016  Component Date Value Ref Range Status  . WBC 04/11/2016 8.4  3.6 - 11.0 K/uL Final  . RBC 04/11/2016 4.95  3.80 - 5.20 MIL/uL Final  . Hemoglobin 04/11/2016 13.3  12.0 - 16.0 g/dL Final  . HCT 04/11/2016 39.8  35.0 - 47.0 % Final  . MCV 04/11/2016 80.6  80.0 - 100.0 fL Final  . MCH 04/11/2016 26.9  26.0 - 34.0 pg Final  . MCHC 04/11/2016 33.4  32.0 - 36.0 g/dL Final  . RDW 04/11/2016 13.1  11.5 - 14.5 % Final  . Platelets 04/11/2016 185  150 - 440 K/uL Final  . Neutrophils Relative % 04/11/2016 72  % Final  . Neutro Abs 04/11/2016 6.0  1.4  - 6.5 K/uL Final  . Lymphocytes Relative 04/11/2016 16  % Final  . Lymphs Abs 04/11/2016 1.4  1.0 - 3.6 K/uL Final  . Monocytes Relative 04/11/2016 7  % Final  . Monocytes Absolute 04/11/2016 0.6  0.2 - 0.9 K/uL Final  . Eosinophils Relative 04/11/2016 4  % Final  . Eosinophils Absolute 04/11/2016 0.4  0 - 0.7 K/uL Final  . Basophils Relative 04/11/2016 1  % Final  . Basophils Absolute 04/11/2016 0.1  0 - 0.1 K/uL Final  . Sodium 04/11/2016 140  135 - 145 mmol/L Final  . Potassium 04/11/2016 3.4* 3.5 - 5.1 mmol/L Final  . Chloride 04/11/2016 106  101 - 111 mmol/L Final  . CO2  04/11/2016 27  22 - 32 mmol/L Final  . Glucose, Bld 04/11/2016 150* 65 - 99 mg/dL Final  . BUN 04/11/2016 15  6 - 20 mg/dL Final  . Creatinine, Ser 04/11/2016 0.64  0.44 - 1.00 mg/dL Final  . Calcium 04/11/2016 9.1  8.9 - 10.3 mg/dL Final  . Total Protein 04/11/2016 7.2  6.5 - 8.1 g/dL Final  . Albumin 04/11/2016 3.8  3.5 - 5.0 g/dL Final  . AST 04/11/2016 27  15 - 41 U/L Final  . ALT 04/11/2016 17  14 - 54 U/L Final  . Alkaline Phosphatase 04/11/2016 57  38 - 126 U/L Final  . Total Bilirubin 04/11/2016 0.7  0.3 - 1.2 mg/dL Final  . GFR calc non Af Amer 04/11/2016 >60  >60 mL/min Final  . GFR calc Af Amer 04/11/2016 >60  >60 mL/min Final   Comment: (NOTE) The eGFR has been calculated using the CKD EPI equation. This calculation has not been validated in all clinical situations. eGFR's persistently <60 mL/min signify possible Chronic Kidney Disease.   . Anion gap 04/11/2016 7  5 - 15 Final    STUDIES:  IMPRESSION: 1. New and enlarging bilateral pulmonary nodules. Nodules are currently too numerous to count. An index nodule in the left lower lobe is increased from 1.2 cm diameter to 1.8 cm diameter. 2. Centrilobular emphysema with airway thickening. 3. 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 4. Small right pleural effusion, likely malignant. 5. 1.5 by 1.8 cm  hyperdense exophytic left kidney upper pole lesion, complex cyst versus mass. Not visibly hypermetabolic on prior PET-CT, and with similar hyperdensity on portal venous and delayed phase images.  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 On 12/01/14 - Right Pleural Fluid Cytology. POSITIVE FOR MALIGNANT CELLS. ADENOCARCINOMA, CONSISTENT WITH LUNG ORIGIN. Molecular testing for EGFR, ALK and others is negative  PLAN:   1. 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  Patient now has noted progression on nivolumab.. Previously patient was tested and no driver mutation present, therefore will proceed with single agent Taxotere 75 mg/m every 3 weeks. We will also use OnPro Neulasta for white blood cell count support.  Will reimage after 4 treatments. Return to clinic in 1 week to receive cycle 1 of 4.  2. Headaches: MRI the brain completed on March 03, 2016 is essentially unchanged from previous.  3. Pain (back,hip, arm): MRI and bone scan results reviewed independently revealing metastatic disease in C6 there is likely causing her symptoms. Lumbar MRI revealed an enhancing lesion and L2 as well as a suspicious 12 mm lesion and right sacrum. Patient has completed XRT. Patient states tramadol no longer works and has agreed to try a narcotic. She is given a prescription for 10 mg oxycodone.  4. Kidney lesion: Unclear if metastatic deposit or second primary of the left kidney. Continue to monitor closely.  5. Anxiety: Resolved. Continue Xanax 1 mg at night and 0.5 mg as needed during the day.     6. Diarrhea: Resolved. Monitor while on nivolumab.     7. Hypokalemia: Continue oral potassium supplement to 20 meq BID.     8. Bony lesions:  Consider Zometa in the future.   Lloyd Huger, MD 04/13/16 11:58 PM

## 2016-04-17 ENCOUNTER — Other Ambulatory Visit: Payer: Self-pay | Admitting: *Deleted

## 2016-04-17 MED ORDER — ALPRAZOLAM 1 MG PO TABS: ORAL_TABLET | ORAL | 0 refills | Status: DC

## 2016-04-17 NOTE — Progress Notes (Signed)
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 She continues to have a decreased performance status is declining she complains of increased weakness and fatigue. She continues to have significant left arm pain and weakness, but admits this has improved. She does not complain of headache today.  She otherwise feels well. She denies any recent fevers. She has a good appetite and denies weight loss. She denies any chest pain or tightness, hemoptysis, or shortness of breath. She denies any nausea, vomiting, diarrhea or constipation.  Patient offers no further specific complaints today.   REVIEW OF SYSTEMS:   Review of Systems  Constitutional: Positive for malaise/fatigue. Negative for diaphoresis, fever and weight loss.  Eyes: Negative.   Respiratory: Negative.   Cardiovascular: Negative for chest pain, palpitations and leg swelling.  Gastrointestinal: Negative for abdominal pain, blood in stool, diarrhea, melena and nausea.  Genitourinary: Negative.   Musculoskeletal: Positive for back pain.       Left arm pain  Skin: Negative.   Neurological: Positive for weakness.  Psychiatric/Behavioral: Negative.     As per HPI. Otherwise, a complete review of systems is negatve.   PAST MEDICAL HISTORY: Past Medical History:  Diagnosis Date  . Anemia 01/21/2015  . 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 Anxiety   . GERD (gastroesophageal reflux disease)   . Hypercholesteremia   . Hypertension   . Last menstrual period (LMP) > 10 days ago 2007  . Lung cancer  ([place])   . Metastatic lung cancer (metastasis from lung to other site) [facility]) 01/21/2015    PAST SURGICAL HISTORY: Past Surgical History:  Procedure Laterality Date  . ABDOMINAL HYSTERECTOMY     partial  . INSERTION / PLACEMENT PLEURAL CATHETER    . PORTACATH PLACEMENT Right 04/26/2015   Procedure: INSERTION PORT-A-CATH;  Surgeon: Nestor Lewandowsky, MD;  Location: [facility] ORS;  Service: General;  Laterality: Right;    FAMILY HISTORY Family History  Problem Relation Age of Onset  . Cervical cancer Mother   . Uterine cancer Maternal Grandmother     GYNECOLOGIC HISTORY:  No LMP recorded. Patient has had a hysterectomy.     ADVANCED DIRECTIVES:    HEALTH MAINTENANCE: Social History  Substance Use Topics  . Smoking status: Former Smoker    Packs/day: 0.25    Years: 8.00    Types: Cigarettes    Quit date: 07/20/2014  . Smokeless tobacco: Not on file  . Alcohol use No    Allergies  Allergen Reactions  . Tegaderm Ag Mesh [Silver] Other (See Comments)    blisters  . Aspirin Other (See Comments)    GIB    Current Outpatient Prescriptions  Medication Sig Dispense Refill  . ALPRAZolam (XANAX) 1 MG tablet TAKE 1/2 BY MOUTH 1 TO 2 TIMES DAILY AS NEEDED FOR ANXIETY AND TAKE 1 BY MOUTH AT NIGHT FOR ANXIETY/SLEEP 45 tablet 0  . lidocaine-prilocaine (EMLA) cream Apply cream 1 hour before chemotherapy treatment 30 g 1  . lisinopril-hydrochlorothiazide (PRINZIDE,ZESTORETIC) 10-12.5 MG per tablet Take 1 tablet by mouth daily.    . Multiple Vitamins-Minerals (MULTIVITAMIN WITH MINERALS) tablet Take 1  tablet by mouth daily. 30 tablet 3  . omeprazole (PRILOSEC) 20 MG capsule Take 20 mg by mouth every morning.     [name] oxyCODONE (OXY IR/ROXICODONE) 5 MG immediate release tablet Take 1 tablet (5 mg total) by mouth 3 (three) times daily as needed for severe pain. 30 tablet 0  . potassium chloride SA (K-DUR,KLOR-CON) 20 MEQ tablet Take 1 tablet (20 mEq total) by mouth daily. 90 tablet 1  . simvastatin  (ZOCOR) 20 MG tablet Take 20 mg by mouth at bedtime.     . traMADol (ULTRAM) 50 MG tablet Take 1 tablet (50 mg total) by mouth every 6 (six) hours as needed. 90 tablet 1   No current facility-administered medications for this visit.    Facility-Administered Medications Ordered in Other Visits  Medication Dose Route Frequency Provider Last Rate Last Dose  . heparin lock flush 100 unit/mL  250 Units Intracatheter PRN Evlyn Kanner, NP      . sodium chloride 0.9 % injection 10 mL  10 mL Intracatheter PRN Evlyn Kanner, NP      . sodium chloride 0.9 % injection 10 mL  10 mL Intravenous PRN Leia Alf, MD   10 mL at 04/27/15 0905  . sodium chloride 0.9 % injection 3 mL  3 mL Intracatheter PRN Evlyn Kanner, NP        OBJECTIVE: BP 106/74 (BP Location: Right Arm, Patient Position: Sitting)   Pulse 90   Temp (!) 94.6 F (34.8 C) (Tympanic)   Resp 17   Ht 5' 5"  (1.651 m)   Wt 164 lb 5.7 oz (74.6 kg)   SpO2 96%   BMI 27.35 kg/m    Body mass index is 27.35 kg/m.    ECOG FS:2 - Symptomatic, <50% confined to bed  General: Well-developed, well-nourished, no acute distress. Eyes: Pink conjunctiva, anicteric sclera. Lungs: Clear to auscultation bilaterally. Heart: Regular rate and rhythm. No rubs, murmurs, or gallops. Musculoskeletal: No edemal, No cyanosis, or clubbing.  Neuro: Alert, answering all questions appropriately.  Skin: No rashes or petechiae noted. Psych: Normal affect.    LAB RESULTS:  Infusion on 04/18/2016  Component Date Value Ref Range Status  . WBC 04/18/2016 8.3  3.6 - 11.0 K/uL Final  . RBC 04/18/2016 4.79  3.80 - 5.20 MIL/uL Final  . Hemoglobin 04/18/2016 13.0  12.0 - 16.0 g/dL Final  . HCT 04/18/2016 38.3  35.0 - 47.0 % Final  . MCV 04/18/2016 80.0  80.0 - 100.0 fL Final  . MCH 04/18/2016 27.1  26.0 - 34.0 pg Final  . MCHC 04/18/2016 33.9  32.0 - 36.0 g/dL Final  . RDW 04/18/2016 13.1  11.5 - 14.5 % Final  . Platelets 04/18/2016 196  150 - 440 K/uL  Final  . Neutrophils Relative % 04/18/2016 65  % Final  . Neutro Abs 04/18/2016 5.5  1.4 - 6.5 K/uL Final  . Lymphocytes Relative 04/18/2016 21  % Final  . Lymphs Abs 04/18/2016 1.7  1.0 - 3.6 K/uL Final  . Monocytes Relative 04/18/2016 9  % Final  . Monocytes Absolute 04/18/2016 0.7  0.2 - 0.9 K/uL Final  . Eosinophils Relative 04/18/2016 5  % Final  . Eosinophils Absolute 04/18/2016 0.4  0 - 0.7 K/uL Final  . Basophils Relative 04/18/2016 0  % Final  . Basophils Absolute 04/18/2016 0.0  0 - 0.1 K/uL Final  . Sodium 04/18/2016 134* 135 - 145 mmol/L Final  . Potassium 04/18/2016 3.4* 3.5 - 5.1 mmol/L  Final  . Chloride 04/18/2016 102  101 - 111 mmol/L Final  . CO2 04/18/2016 25  22 - 32 mmol/L Final  . Glucose, Bld 04/18/2016 123* 65 - 99 mg/dL Final  . BUN 04/18/2016 19  6 - 20 mg/dL Final  . Creatinine, Ser 04/18/2016 0.77  0.44 - 1.00 mg/dL Final  . Calcium 04/18/2016 9.2  8.9 - 10.3 mg/dL Final  . Total Protein 04/18/2016 7.2  6.5 - 8.1 g/dL Final  . Albumin 04/18/2016 3.6  3.5 - 5.0 g/dL Final  . AST 04/18/2016 25  15 - 41 U/L Final  . ALT 04/18/2016 17  14 - 54 U/L Final  . Alkaline Phosphatase 04/18/2016 58  38 - 126 U/L Final  . Total Bilirubin 04/18/2016 0.9  0.3 - 1.2 mg/dL Final  . GFR calc non Af Amer 04/18/2016 >60  >60 mL/min Final  . GFR calc Af Amer 04/18/2016 >60  >60 mL/min Final   Comment: (NOTE) The eGFR has been calculated using the CKD EPI equation. This calculation has not been validated in all clinical situations. eGFR's persistently <60 mL/min signify possible Chronic Kidney Disease.   . Anion gap 04/18/2016 7  5 - 15 Final    STUDIES:  IMPRESSION: 1. New and enlarging bilateral pulmonary nodules. Nodules are currently too numerous to count. An index nodule in the left lower lobe is increased from 1.2 cm diameter to 1.8 cm diameter. 2. Centrilobular emphysema with airway thickening. 3. Interstitial accentuation of the right lung base  favors lymphangitic carcinomatosis. 4. Small right pleural effusion, likely malignant. 5. 1.5 by 1.8 cm hyperdense exophytic left kidney upper pole lesion, complex cyst versus mass. Not visibly hypermetabolic on prior PET-CT, and with similar hyperdensity on portal venous and delayed phase images.  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 On 12/01/14 - Right Pleural Fluid Cytology. POSITIVE FOR MALIGNANT CELLS. ADENOCARCINOMA, CONSISTENT WITH LUNG ORIGIN. Molecular testing for EGFR, ALK and others is negative  PLAN:   1. 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  Patient now has noted progression on nivolumab. Previously patient was tested and no driver mutation present, therefore will proceed with cycle 1 of single agent Taxotere 75 mg/m every 3 weeks. Will also use OnPro Neulasta for white blood cell count support.  Will reimage after 4 treatments. Return to clinic in 1 week for laboratory work only and then in 3 weeks for further evaluation and consideration of cycle 2.  2. Headaches: MRI the brain completed on March 03, 2016 is essentially unchanged from previous.  3. Pain (back,hip, arm): MRI and bone scan results reviewed independently revealing metastatic disease in C6 there is likely causing her symptoms. Lumbar MRI revealed an enhancing lesion and L2 as well as a suspicious 12 mm lesion and right sacrum. Patient has completed XRT. Continue 10 mg oxycodone as needed.  4. Kidney lesion: Unclear if metastatic deposit or second primary of the left kidney. Continue to monitor closely.  5. Anxiety: Resolved. Continue Xanax 1 mg at night and 0.5 mg as needed during the day.     6. Diarrhea: Resolved. Monitor while on  nivolumab.     7. Hypokalemia: Continue oral potassium supplement to 20 meq BID.     8. Bony lesions: Consider Zometa  in the future.   Lloyd Huger, MD 04/18/16 11:46 PM

## 2016-04-18 ENCOUNTER — Inpatient Hospital Stay: Payer: BLUE CROSS/BLUE SHIELD

## 2016-04-18 ENCOUNTER — Inpatient Hospital Stay: Payer: BLUE CROSS/BLUE SHIELD | Attending: Oncology | Admitting: Oncology

## 2016-04-18 VITALS — BP 106/74 | HR 90 | Temp 94.6°F | Resp 17 | Ht 65.0 in | Wt 164.4 lb

## 2016-04-18 VITALS — BP 120/78 | HR 81

## 2016-04-18 DIAGNOSIS — Z87891 Personal history of nicotine dependence: Secondary | ICD-10-CM | POA: Diagnosis not present

## 2016-04-18 DIAGNOSIS — C7951 Secondary malignant neoplasm of bone: Secondary | ICD-10-CM | POA: Diagnosis not present

## 2016-04-18 DIAGNOSIS — C3491 Malignant neoplasm of unspecified part of right bronchus or lung: Secondary | ICD-10-CM

## 2016-04-18 DIAGNOSIS — C7931 Secondary malignant neoplasm of brain: Secondary | ICD-10-CM | POA: Diagnosis not present

## 2016-04-18 DIAGNOSIS — R51 Headache: Secondary | ICD-10-CM | POA: Diagnosis not present

## 2016-04-18 DIAGNOSIS — J910 Malignant pleural effusion: Secondary | ICD-10-CM | POA: Insufficient documentation

## 2016-04-18 DIAGNOSIS — M79602 Pain in left arm: Secondary | ICD-10-CM

## 2016-04-18 DIAGNOSIS — C7802 Secondary malignant neoplasm of left lung: Secondary | ICD-10-CM | POA: Diagnosis not present

## 2016-04-18 DIAGNOSIS — R531 Weakness: Secondary | ICD-10-CM | POA: Diagnosis not present

## 2016-04-18 DIAGNOSIS — R5381 Other malaise: Secondary | ICD-10-CM | POA: Diagnosis not present

## 2016-04-18 DIAGNOSIS — M25559 Pain in unspecified hip: Secondary | ICD-10-CM | POA: Insufficient documentation

## 2016-04-18 DIAGNOSIS — Z79899 Other long term (current) drug therapy: Secondary | ICD-10-CM | POA: Insufficient documentation

## 2016-04-18 DIAGNOSIS — I10 Essential (primary) hypertension: Secondary | ICD-10-CM

## 2016-04-18 DIAGNOSIS — Z5111 Encounter for antineoplastic chemotherapy: Secondary | ICD-10-CM | POA: Insufficient documentation

## 2016-04-18 DIAGNOSIS — M549 Dorsalgia, unspecified: Secondary | ICD-10-CM | POA: Diagnosis not present

## 2016-04-18 DIAGNOSIS — E7800 Pure hypercholesterolemia, unspecified: Secondary | ICD-10-CM | POA: Diagnosis not present

## 2016-04-18 DIAGNOSIS — Z7689 Persons encountering health services in other specified circumstances: Secondary | ICD-10-CM | POA: Insufficient documentation

## 2016-04-18 DIAGNOSIS — N289 Disorder of kidney and ureter, unspecified: Secondary | ICD-10-CM | POA: Diagnosis not present

## 2016-04-18 DIAGNOSIS — E876 Hypokalemia: Secondary | ICD-10-CM | POA: Insufficient documentation

## 2016-04-18 DIAGNOSIS — R5383 Other fatigue: Secondary | ICD-10-CM | POA: Diagnosis not present

## 2016-04-18 DIAGNOSIS — K219 Gastro-esophageal reflux disease without esophagitis: Secondary | ICD-10-CM | POA: Diagnosis not present

## 2016-04-18 LAB — CBC WITH DIFFERENTIAL/PLATELET
Basophils Absolute: 0 10*3/uL (ref 0–0.1)
Basophils Relative: 0 %
EOS ABS: 0.4 10*3/uL (ref 0–0.7)
EOS PCT: 5 %
HCT: 38.3 % (ref 35.0–47.0)
Hemoglobin: 13 g/dL (ref 12.0–16.0)
LYMPHS ABS: 1.7 10*3/uL (ref 1.0–3.6)
LYMPHS PCT: 21 %
MCH: 27.1 pg (ref 26.0–34.0)
MCHC: 33.9 g/dL (ref 32.0–36.0)
MCV: 80 fL (ref 80.0–100.0)
MONO ABS: 0.7 10*3/uL (ref 0.2–0.9)
MONOS PCT: 9 %
Neutro Abs: 5.5 10*3/uL (ref 1.4–6.5)
Neutrophils Relative %: 65 %
PLATELETS: 196 10*3/uL (ref 150–440)
RBC: 4.79 MIL/uL (ref 3.80–5.20)
RDW: 13.1 % (ref 11.5–14.5)
WBC: 8.3 10*3/uL (ref 3.6–11.0)

## 2016-04-18 LAB — COMPREHENSIVE METABOLIC PANEL
ALT: 17 U/L (ref 14–54)
ANION GAP: 7 (ref 5–15)
AST: 25 U/L (ref 15–41)
Albumin: 3.6 g/dL (ref 3.5–5.0)
Alkaline Phosphatase: 58 U/L (ref 38–126)
BUN: 19 mg/dL (ref 6–20)
CHLORIDE: 102 mmol/L (ref 101–111)
CO2: 25 mmol/L (ref 22–32)
CREATININE: 0.77 mg/dL (ref 0.44–1.00)
Calcium: 9.2 mg/dL (ref 8.9–10.3)
Glucose, Bld: 123 mg/dL — ABNORMAL HIGH (ref 65–99)
Potassium: 3.4 mmol/L — ABNORMAL LOW (ref 3.5–5.1)
SODIUM: 134 mmol/L — AB (ref 135–145)
Total Bilirubin: 0.9 mg/dL (ref 0.3–1.2)
Total Protein: 7.2 g/dL (ref 6.5–8.1)

## 2016-04-18 MED ORDER — DEXAMETHASONE SODIUM PHOSPHATE 100 MG/10ML IJ SOLN
10.0000 mg | Freq: Once | INTRAMUSCULAR | Status: AC
  Administered 2016-04-18: 10 mg via INTRAVENOUS
  Filled 2016-04-18: qty 1

## 2016-04-18 MED ORDER — SODIUM CHLORIDE 0.9 % IV SOLN
Freq: Once | INTRAVENOUS | Status: AC
  Administered 2016-04-18: 10:00:00 via INTRAVENOUS
  Filled 2016-04-18: qty 1000

## 2016-04-18 MED ORDER — HEPARIN SOD (PORK) LOCK FLUSH 100 UNIT/ML IV SOLN
500.0000 [IU] | Freq: Once | INTRAVENOUS | Status: AC | PRN
  Administered 2016-04-18: 500 [IU]
  Filled 2016-04-18: qty 5

## 2016-04-18 MED ORDER — SODIUM CHLORIDE 0.9% FLUSH
10.0000 mL | INTRAVENOUS | Status: DC | PRN
  Administered 2016-04-18: 10 mL
  Filled 2016-04-18: qty 10

## 2016-04-18 MED ORDER — DOCETAXEL CHEMO INJECTION 160 MG/16ML
75.0000 mg/m2 | Freq: Once | INTRAVENOUS | Status: AC
  Administered 2016-04-18: 140 mg via INTRAVENOUS
  Filled 2016-04-18: qty 14

## 2016-04-18 MED ORDER — PEGFILGRASTIM 6 MG/0.6ML ~~LOC~~ PSKT
6.0000 mg | PREFILLED_SYRINGE | Freq: Once | SUBCUTANEOUS | Status: AC
  Administered 2016-04-18: 6 mg via SUBCUTANEOUS
  Filled 2016-04-18: qty 0.6

## 2016-04-18 NOTE — Progress Notes (Signed)
Pt reports no changes really since last visit per pt.  Pt reports tingling on numbness in left hand.

## 2016-04-19 ENCOUNTER — Telehealth: Payer: Self-pay

## 2016-04-19 NOTE — Telephone Encounter (Signed)
Patient was not sure how to take off on pro and did not understand how it worked

## 2016-04-24 ENCOUNTER — Telehealth: Payer: Self-pay | Admitting: *Deleted

## 2016-04-24 NOTE — Telephone Encounter (Signed)
Called to report that she has had a headache and feels run down since chemo and XRT. Reports that H/A is better nowAsking if that is nml. I explained that it will make her feel fatigued and that she has an appt tomorrow for lab check and that she may want to wait for results and to let the lab person know she is waiting for results.

## 2016-04-25 ENCOUNTER — Inpatient Hospital Stay: Payer: BLUE CROSS/BLUE SHIELD

## 2016-04-25 DIAGNOSIS — C3491 Malignant neoplasm of unspecified part of right bronchus or lung: Secondary | ICD-10-CM | POA: Diagnosis not present

## 2016-04-25 LAB — COMPREHENSIVE METABOLIC PANEL
ALBUMIN: 3.9 g/dL (ref 3.5–5.0)
ALT: 21 U/L (ref 14–54)
AST: 29 U/L (ref 15–41)
Alkaline Phosphatase: 105 U/L (ref 38–126)
Anion gap: 8 (ref 5–15)
BILIRUBIN TOTAL: 0.5 mg/dL (ref 0.3–1.2)
BUN: 22 mg/dL — AB (ref 6–20)
CHLORIDE: 101 mmol/L (ref 101–111)
CO2: 27 mmol/L (ref 22–32)
Calcium: 9.1 mg/dL (ref 8.9–10.3)
Creatinine, Ser: 0.94 mg/dL (ref 0.44–1.00)
GFR calc Af Amer: >60 mL/min (ref >60)
GFR calc non Af Amer: >60 mL/min (ref >60)
GLUCOSE: 119 mg/dL — AB (ref 65–99)
POTASSIUM: 3.7 mmol/L (ref 3.5–5.1)
Sodium: 136 mmol/L (ref 135–145)
TOTAL PROTEIN: 7 g/dL (ref 6.5–8.1)

## 2016-04-25 LAB — CBC WITH DIFFERENTIAL/PLATELET
Basophils Absolute: 0.2 10*3/uL — ABNORMAL HIGH (ref 0–0.1)
Basophils Relative: 1 %
EOS ABS: 0.1 10*3/uL (ref 0–0.7)
EOS PCT: 0 %
HCT: 41 % (ref 35.0–47.0)
Hemoglobin: 13.7 g/dL (ref 12.0–16.0)
LYMPHS ABS: 2.4 10*3/uL (ref 1.0–3.6)
LYMPHS PCT: 7 %
MCH: 26.6 pg (ref 26.0–34.0)
MCHC: 33.4 g/dL (ref 32.0–36.0)
MCV: 79.5 fL — AB (ref 80.0–100.0)
MONO ABS: 0.5 10*3/uL (ref 0.2–0.9)
MONOS PCT: 1 %
Neutro Abs: 32.6 10*3/uL — ABNORMAL HIGH (ref 1.4–6.5)
Neutrophils Relative %: 91 %
PLATELETS: 187 10*3/uL (ref 150–440)
RBC: 5.16 MIL/uL (ref 3.80–5.20)
RDW: 13.4 % (ref 11.5–14.5)
WBC: 35.6 10*3/uL — AB (ref 3.6–11.0)

## 2016-04-27 ENCOUNTER — Telehealth: Payer: Self-pay | Admitting: *Deleted

## 2016-04-27 MED ORDER — OXYCODONE HCL 5 MG PO TABS: 5.0000 mg | ORAL_TABLET | Freq: Three times a day (TID) | ORAL | 0 refills | Status: DC | PRN

## 2016-04-27 NOTE — Telephone Encounter (Signed)
Informed that prescription is ready to pick up  

## 2016-05-08 NOTE — Progress Notes (Signed)
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 Her performance status has improved, although she continues to have persistent weakness and fatigue. She continues to have left arm pain. She does not complain of headache today.  She otherwise feels well. She denies any recent fevers. She has a good appetite and denies weight loss. She denies any chest pain or tightness, hemoptysis, or shortness of breath. She denies any nausea, vomiting, diarrhea or constipation.  Patient offers no further specific complaints today.   REVIEW OF SYSTEMS:   Review of Systems  Constitutional: Positive for malaise/fatigue. Negative for diaphoresis, fever and weight loss.  Eyes: Negative.   Respiratory: Negative.   Cardiovascular: Negative for chest pain, palpitations and leg swelling.  Gastrointestinal: Negative for abdominal pain, blood in stool, diarrhea, melena and nausea.  Genitourinary: Negative.   Musculoskeletal: Positive for back pain.       Left arm pain  Skin: Negative.   Neurological: Positive for weakness.  Psychiatric/Behavioral: Negative.     As per HPI. Otherwise, a complete review of systems is negatve.   PAST MEDICAL HISTORY: Past Medical History:  Diagnosis Date  . Anemia 01/21/2015  . 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 Anxiety   . GERD (gastroesophageal reflux disease)   . Hypercholesteremia   . Hypertension   . Last menstrual period (LMP) > 10 days ago 2007  . Lung cancer ([place])   . Metastatic lung cancer (metastasis from lung to other site) [facility]) 01/21/2015    PAST  SURGICAL HISTORY: Past Surgical History:  Procedure Laterality Date  . ABDOMINAL HYSTERECTOMY     partial  . INSERTION / PLACEMENT PLEURAL CATHETER    . PORTACATH PLACEMENT Right 04/26/2015   Procedure: INSERTION PORT-A-CATH;  Surgeon: Nestor Lewandowsky, MD;  Location: [facility] ORS;  Service: General;  Laterality: Right;    FAMILY HISTORY Family History  Problem Relation Age of Onset  . Cervical cancer Mother   . Uterine cancer Maternal Grandmother     GYNECOLOGIC HISTORY:  No LMP recorded. Patient has had a hysterectomy.     ADVANCED DIRECTIVES:    HEALTH MAINTENANCE: Social History  Substance Use Topics  . Smoking status: Former Smoker    Packs/day: 0.25    Years: 8.00    Types: Cigarettes    Quit date: 07/20/2014  . Smokeless tobacco: Not on file  . Alcohol use No    Allergies  Allergen Reactions  . Tegaderm Ag Mesh [Silver] Other (See Comments)    blisters  . Aspirin Other (See Comments)    GIB    Current Outpatient Prescriptions  Medication Sig Dispense Refill  . ALPRAZolam (XANAX) 1 MG tablet TAKE 1/2 BY MOUTH 1 TO 2 TIMES DAILY AS NEEDED FOR ANXIETY AND TAKE 1 BY MOUTH AT NIGHT FOR ANXIETY/SLEEP 45 tablet 0  . lidocaine-prilocaine (EMLA) cream Apply cream 1 hour before chemotherapy treatment 30 g 1  . lisinopril-hydrochlorothiazide (PRINZIDE,ZESTORETIC) 10-12.5 MG per tablet Take 1 tablet by mouth daily.    . Multiple Vitamins-Minerals (MULTIVITAMIN WITH MINERALS) tablet Take 1 tablet by mouth daily. 30 tablet 3  . omeprazole (PRILOSEC) 20 MG  capsule Take 20 mg by mouth every morning.     [name] oxyCODONE (OXY IR/ROXICODONE) 5 MG immediate release tablet Take 1 tablet (5 mg total) by mouth 3 (three) times daily as needed for severe pain. 30 tablet 0  . traMADol (ULTRAM) 50 MG tablet Take 1 tablet (50 mg total) by mouth every 6 (six) hours as needed. 90 tablet 1  . gabapentin (NEURONTIN) 300 MG capsule Take 1 capsule (300 mg total) by mouth 3 (three) times daily. 90 capsule 2   . potassium chloride SA (K-DUR,KLOR-CON) 20 MEQ tablet Take 1 tablet (20 mEq total) by mouth daily. 90 tablet 1  . simvastatin (ZOCOR) 20 MG tablet Take 20 mg by mouth at bedtime.      No current facility-administered medications for this visit.    Facility-Administered Medications Ordered in Other Visits  Medication Dose Route Frequency Provider Last Rate Last Dose  . 0.9 %  sodium chloride infusion   Intravenous Once Lloyd Huger, MD      . dexamethasone (DECADRON) 10 mg in sodium chloride 0.9 % 50 mL IVPB  10 mg Intravenous Once Lloyd Huger, MD      . DOCEtaxel (TAXOTERE) 140 mg in dextrose 5 % 250 mL chemo infusion  75 mg/m2 (Order-Specific) Intravenous Once Lloyd Huger, MD      . heparin lock flush 100 unit/mL  250 Units Intracatheter PRN Evlyn Kanner, NP      . heparin lock flush 100 unit/mL  500 Units Intracatheter Once PRN Lloyd Huger, MD      . pegfilgrastim (NEULASTA ONPRO KIT) injection 6 mg  6 mg Subcutaneous Once Lloyd Huger, MD      . sodium chloride 0.9 % injection 10 mL  10 mL Intracatheter PRN Evlyn Kanner, NP      . sodium chloride 0.9 % injection 10 mL  10 mL Intravenous PRN Leia Alf, MD   10 mL at 04/27/15 0905  . sodium chloride 0.9 % injection 3 mL  3 mL Intracatheter PRN Evlyn Kanner, NP        OBJECTIVE: BP 97/68 (BP Location: Left Arm, Patient Position: Sitting)   Pulse 80   Temp 97.7 F (36.5 C)   Resp 18   Wt 168 lb 10.4 oz (76.5 kg)   BMI 28.07 kg/m    Body mass index is 28.07 kg/m.    ECOG FS:1 - Symptomatic but completely ambulatory  General: Well-developed, well-nourished, no acute distress. Eyes: Pink conjunctiva, anicteric sclera. Lungs: Clear to auscultation bilaterally. Heart: Regular rate and rhythm. No rubs, murmurs, or gallops. Musculoskeletal: No edemal, No cyanosis, or clubbing.  Neuro: Alert, answering all questions appropriately.  Skin: No rashes or petechiae noted. Psych: Normal  affect.    LAB RESULTS:  Appointment on 05/09/2016  Component Date Value Ref Range Status  . WBC 05/09/2016 9.9  3.6 - 11.0 K/uL Final  . RBC 05/09/2016 4.35  3.80 - 5.20 MIL/uL Final  . Hemoglobin 05/09/2016 11.7* 12.0 - 16.0 g/dL Final  . HCT 05/09/2016 34.5* 35.0 - 47.0 % Final  . MCV 05/09/2016 79.5* 80.0 - 100.0 fL Final  . MCH 05/09/2016 26.9  26.0 - 34.0 pg Final  . MCHC 05/09/2016 33.9  32.0 - 36.0 g/dL Final  . RDW 05/09/2016 14.2  11.5 - 14.5 % Final  . Platelets 05/09/2016 282  150 - 440 K/uL Final  . Neutrophils Relative % 05/09/2016 67  % Final  . Neutro Abs 05/09/2016  6.6* 1.4 - 6.5 K/uL Final  . Lymphocytes Relative 05/09/2016 20  % Final  . Lymphs Abs 05/09/2016 2.0  1.0 - 3.6 K/uL Final  . Monocytes Relative 05/09/2016 11  % Final  . Monocytes Absolute 05/09/2016 1.1* 0.2 - 0.9 K/uL Final  . Eosinophils Relative 05/09/2016 1  % Final  . Eosinophils Absolute 05/09/2016 0.1  0 - 0.7 K/uL Final  . Basophils Relative 05/09/2016 1  % Final  . Basophils Absolute 05/09/2016 0.1  0 - 0.1 K/uL Final  . Sodium 05/09/2016 137  135 - 145 mmol/L Final  . Potassium 05/09/2016 3.4* 3.5 - 5.1 mmol/L Final  . Chloride 05/09/2016 106  101 - 111 mmol/L Final  . CO2 05/09/2016 25  22 - 32 mmol/L Final  . Glucose, Bld 05/09/2016 136* 65 - 99 mg/dL Final  . BUN 05/09/2016 17  6 - 20 mg/dL Final  . Creatinine, Ser 05/09/2016 0.70  0.44 - 1.00 mg/dL Final  . Calcium 05/09/2016 8.5* 8.9 - 10.3 mg/dL Final  . Total Protein 05/09/2016 6.5  6.5 - 8.1 g/dL Final  . Albumin 05/09/2016 3.4* 3.5 - 5.0 g/dL Final  . AST 05/09/2016 25  15 - 41 U/L Final  . ALT 05/09/2016 26  14 - 54 U/L Final  . Alkaline Phosphatase 05/09/2016 63  38 - 126 U/L Final  . Total Bilirubin 05/09/2016 0.8  0.3 - 1.2 mg/dL Final  . GFR calc non Af Amer 05/09/2016 >60  >60 mL/min Final  . GFR calc Af Amer 05/09/2016 >60  >60 mL/min Final   Comment: (NOTE) The eGFR has been calculated using the CKD EPI  equation. This calculation has not been validated in all clinical situations. eGFR's persistently <60 mL/min signify possible Chronic Kidney Disease.   . Anion gap 05/09/2016 6  5 - 15 Final    STUDIES:  IMPRESSION: 1. New and enlarging bilateral pulmonary nodules. Nodules are currently too numerous to count. An index nodule in the left lower lobe is increased from 1.2 cm diameter to 1.8 cm diameter. 2. Centrilobular emphysema with airway thickening. 3. 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 4. Small right pleural effusion, likely malignant. 5. 1.5 by 1.8 cm hyperdense exophytic left kidney upper pole lesion, complex cyst versus mass. Not visibly hypermetabolic on prior PET-CT, and with similar hyperdensity on portal venous and delayed phase images.  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 On 12/01/14 - Right Pleural Fluid Cytology. POSITIVE FOR MALIGNANT CELLS. ADENOCARCINOMA, CONSISTENT WITH LUNG ORIGIN. Molecular testing for EGFR, ALK and others is negative  PLAN:   1. 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  Patient now has noted progression on nivolumab. Previously patient was tested and no driver mutation present, therefore will proceed with cycle 2 of single agent Taxotere 75 mg/m every 3 weeks. Will also use OnPro Neulasta for white blood cell count support.  Will reimage after 4 treatments. Return to clinic in 3 weeks for further evaluation and consideration of cycle 3.  2. Headaches: MRI the brain completed on March 03, 2016 is essentially unchanged from previous.  3. Pain (back, hip, arm): MRI and bone scan results reviewed independently revealing metastatic  disease in C6 there is likely causing her symptoms. Lumbar  MRI revealed an enhancing lesion and L2 as well as a suspicious 12 mm lesion and right sacrum. Patient has completed XRT. Continue 10 mg oxycodone as needed. Patient was also given a prescription for gabapentin 300 mg 3 times per day. 4. Kidney lesion: Unclear if metastatic deposit or second primary of the left kidney. Continue to monitor closely.  5. Anxiety: Resolved. Continue Xanax 1 mg at night and 0.5 mg as needed during the day.     6. Hypokalemia: Continue oral potassium supplement to 20 meq daily.     7. Bony lesions: Consider Zometa in the future.   Lloyd Huger, MD 05/09/16 10:34 AM

## 2016-05-09 ENCOUNTER — Inpatient Hospital Stay (HOSPITAL_BASED_OUTPATIENT_CLINIC_OR_DEPARTMENT_OTHER): Payer: BLUE CROSS/BLUE SHIELD | Admitting: Oncology

## 2016-05-09 ENCOUNTER — Inpatient Hospital Stay: Payer: BLUE CROSS/BLUE SHIELD

## 2016-05-09 VITALS — BP 97/68 | HR 80 | Temp 97.7°F | Resp 18 | Wt 168.7 lb

## 2016-05-09 DIAGNOSIS — C7951 Secondary malignant neoplasm of bone: Secondary | ICD-10-CM | POA: Diagnosis not present

## 2016-05-09 DIAGNOSIS — C7931 Secondary malignant neoplasm of brain: Secondary | ICD-10-CM

## 2016-05-09 DIAGNOSIS — C7802 Secondary malignant neoplasm of left lung: Secondary | ICD-10-CM

## 2016-05-09 DIAGNOSIS — C3491 Malignant neoplasm of unspecified part of right bronchus or lung: Secondary | ICD-10-CM | POA: Diagnosis not present

## 2016-05-09 DIAGNOSIS — R51 Headache: Secondary | ICD-10-CM

## 2016-05-09 DIAGNOSIS — M549 Dorsalgia, unspecified: Secondary | ICD-10-CM

## 2016-05-09 DIAGNOSIS — E876 Hypokalemia: Secondary | ICD-10-CM

## 2016-05-09 DIAGNOSIS — R5381 Other malaise: Secondary | ICD-10-CM

## 2016-05-09 DIAGNOSIS — J910 Malignant pleural effusion: Secondary | ICD-10-CM | POA: Diagnosis not present

## 2016-05-09 DIAGNOSIS — R5383 Other fatigue: Secondary | ICD-10-CM

## 2016-05-09 DIAGNOSIS — R531 Weakness: Secondary | ICD-10-CM

## 2016-05-09 DIAGNOSIS — N289 Disorder of kidney and ureter, unspecified: Secondary | ICD-10-CM

## 2016-05-09 DIAGNOSIS — M79602 Pain in left arm: Secondary | ICD-10-CM

## 2016-05-09 LAB — CBC WITH DIFFERENTIAL/PLATELET
BASOS ABS: 0.1 10*3/uL (ref 0–0.1)
BASOS PCT: 1 %
EOS ABS: 0.1 10*3/uL (ref 0–0.7)
Eosinophils Relative: 1 %
HEMATOCRIT: 34.5 % — AB (ref 35.0–47.0)
HEMOGLOBIN: 11.7 g/dL — AB (ref 12.0–16.0)
Lymphocytes Relative: 20 %
Lymphs Abs: 2 10*3/uL (ref 1.0–3.6)
MCH: 26.9 pg (ref 26.0–34.0)
MCHC: 33.9 g/dL (ref 32.0–36.0)
MCV: 79.5 fL — ABNORMAL LOW (ref 80.0–100.0)
MONOS PCT: 11 %
Monocytes Absolute: 1.1 10*3/uL — ABNORMAL HIGH (ref 0.2–0.9)
NEUTROS ABS: 6.6 10*3/uL — AB (ref 1.4–6.5)
NEUTROS PCT: 67 %
Platelets: 282 10*3/uL (ref 150–440)
RBC: 4.35 MIL/uL (ref 3.80–5.20)
RDW: 14.2 % (ref 11.5–14.5)
WBC: 9.9 10*3/uL (ref 3.6–11.0)

## 2016-05-09 LAB — COMPREHENSIVE METABOLIC PANEL
ALBUMIN: 3.4 g/dL — AB (ref 3.5–5.0)
ALK PHOS: 63 U/L (ref 38–126)
ALT: 26 U/L (ref 14–54)
ANION GAP: 6 (ref 5–15)
AST: 25 U/L (ref 15–41)
BILIRUBIN TOTAL: 0.8 mg/dL (ref 0.3–1.2)
BUN: 17 mg/dL (ref 6–20)
CALCIUM: 8.5 mg/dL — AB (ref 8.9–10.3)
CO2: 25 mmol/L (ref 22–32)
Chloride: 106 mmol/L (ref 101–111)
Creatinine, Ser: 0.7 mg/dL (ref 0.44–1.00)
GFR calc non Af Amer: >60 mL/min (ref >60)
GLUCOSE: 136 mg/dL — AB (ref 65–99)
POTASSIUM: 3.4 mmol/L — AB (ref 3.5–5.1)
SODIUM: 137 mmol/L (ref 135–145)
TOTAL PROTEIN: 6.5 g/dL (ref 6.5–8.1)

## 2016-05-09 MED ORDER — POTASSIUM CHLORIDE CRYS ER 20 MEQ PO TBCR: 20.0000 meq | EXTENDED_RELEASE_TABLET | Freq: Every day | ORAL | 1 refills | Status: AC

## 2016-05-09 MED ORDER — GABAPENTIN 300 MG PO CAPS: 300.0000 mg | ORAL_CAPSULE | Freq: Three times a day (TID) | ORAL | 2 refills | Status: DC

## 2016-05-09 MED ORDER — SODIUM CHLORIDE 0.9 % IV SOLN
Freq: Once | INTRAVENOUS | Status: AC
  Administered 2016-05-09: 11:00:00 via INTRAVENOUS
  Filled 2016-05-09: qty 1000

## 2016-05-09 MED ORDER — HEPARIN SOD (PORK) LOCK FLUSH 100 UNIT/ML IV SOLN
500.0000 [IU] | Freq: Once | INTRAVENOUS | Status: AC | PRN
  Administered 2016-05-09: 500 [IU]

## 2016-05-09 MED ORDER — PEGFILGRASTIM 6 MG/0.6ML ~~LOC~~ PSKT
6.0000 mg | PREFILLED_SYRINGE | Freq: Once | SUBCUTANEOUS | Status: AC
  Administered 2016-05-09: 6 mg via SUBCUTANEOUS
  Filled 2016-05-09: qty 0.6

## 2016-05-09 MED ORDER — DOCETAXEL CHEMO INJECTION 160 MG/16ML
75.0000 mg/m2 | Freq: Once | INTRAVENOUS | Status: AC
  Administered 2016-05-09: 140 mg via INTRAVENOUS
  Filled 2016-05-09: qty 14

## 2016-05-09 MED ORDER — SODIUM CHLORIDE 0.9 % IV SOLN
10.0000 mg | Freq: Once | INTRAVENOUS | Status: AC
  Administered 2016-05-09: 10 mg via INTRAVENOUS
  Filled 2016-05-09: qty 1

## 2016-05-10 ENCOUNTER — Encounter: Payer: Self-pay | Admitting: Radiation Oncology

## 2016-05-10 ENCOUNTER — Ambulatory Visit
Admission: RE | Admit: 2016-05-10 | Discharge: 2016-05-10 | Disposition: A | Discharge status: Home / Self Care | Payer: BLUE CROSS/BLUE SHIELD | Source: Ambulatory Visit | Attending: Radiation Oncology | Admitting: Radiation Oncology

## 2016-05-10 VITALS — BP 114/75 | HR 63 | Temp 96.2°F | Wt 172.2 lb

## 2016-05-10 DIAGNOSIS — C3490 Malignant neoplasm of unspecified part of unspecified bronchus or lung: Secondary | ICD-10-CM | POA: Insufficient documentation

## 2016-05-10 DIAGNOSIS — C7931 Secondary malignant neoplasm of brain: Secondary | ICD-10-CM | POA: Diagnosis present

## 2016-05-10 DIAGNOSIS — C7801 Secondary malignant neoplasm of right lung: Secondary | ICD-10-CM

## 2016-05-10 DIAGNOSIS — Z923 Personal history of irradiation: Secondary | ICD-10-CM | POA: Insufficient documentation

## 2016-05-10 NOTE — Progress Notes (Signed)
Radiation Oncology Follow up Note  Name: [facility]   Date:   05/10/2016 MRN:  [id number] DOB: [birth year]    This 60 y.o. female presents to the clinic today for one-month follow-up for. Palliative radiation therapy for stage IV non-small cell lung cancer with treatment with lumbar spine and cervical spine.  REFERRING PROVIDER: Marguerita Merles, MD  HPI: Patient is a 60 year old female who completed radiation therapy to both her lumbar spine and cervical spine for stage IV non-small cell lung cancer. She seen today in routine follow-up one month out is doing well.. She still complains of some left arm numbness although pain has improved. She is ambulating well. She's currently on narcotic analgesics when necessary. She's currently receiving single agent Taxotere under medical oncology's direction.  COMPLICATIONS OF TREATMENT: none  FOLLOW UP COMPLIANCE: keeps appointments   PHYSICAL EXAM:  BP 114/75   Pulse 63   Temp (!) 96.2 F (35.7 C)   Wt 172 lb 2.9 oz (78.1 kg)   BMI 28.65 kg/m  Well-developed well-nourished patient in NAD. HEENT reveals PERLA, EOMI, discs not visualized.  Oral cavity is clear. No oral mucosal lesions are identified. Neck is clear without evidence of cervical or supraclavicular adenopathy. Lungs are clear to A&P. Cardiac examination is essentially unremarkable with regular rate and rhythm without murmur rub or thrill. Abdomen is benign with no organomegaly or masses noted. Motor sensory and DTR levels are equal and symmetric in the upper and lower extremities. Cranial nerves II through XII are grossly intact. Proprioception is intact. No peripheral adenopathy or edema is identified. No motor or sensory levels are noted. Crude visual fields are within normal range.  RADIOLOGY RESULTS: No current films for review  PLAN: Present time patient is doing well and has expressed excellent palliation for palliative radiation therapy. I'm please were overall progress. I  will turn follow-up care over to medical oncology. We'll be happy to reevaluate the patient at any time should further palliative treatment be indicated.  I would like to take this opportunity to thank you for allowing me to participate in the care of your patient.Armstead Peaks., MD

## 2016-05-12 ENCOUNTER — Other Ambulatory Visit: Payer: Self-pay | Admitting: *Deleted

## 2016-05-12 MED ORDER — TRAMADOL HCL 50 MG PO TABS: 50.0000 mg | ORAL_TABLET | Freq: Four times a day (QID) | ORAL | 1 refills | Status: DC | PRN

## 2016-05-12 NOTE — Telephone Encounter (Signed)
Is she to be on Tramadol and Oxycodone?

## 2016-05-12 NOTE — Telephone Encounter (Signed)
Per Dr. Grayland Ormond patient should be advised to only use Oxycodone for pain. Thank you.

## 2016-05-15 ENCOUNTER — Other Ambulatory Visit: Payer: Self-pay | Admitting: Oncology

## 2016-05-16 ENCOUNTER — Inpatient Hospital Stay
Admission: EM | Admit: 2016-05-16 | Discharge: 2016-05-17 | DRG: 311 | Disposition: A | Discharge status: Home / Self Care | Payer: BLUE CROSS/BLUE SHIELD | Attending: Internal Medicine | Admitting: Internal Medicine

## 2016-05-16 ENCOUNTER — Telehealth: Payer: Self-pay | Admitting: *Deleted

## 2016-05-16 ENCOUNTER — Emergency Department: Payer: BLUE CROSS/BLUE SHIELD

## 2016-05-16 ENCOUNTER — Other Ambulatory Visit: Payer: Self-pay

## 2016-05-16 ENCOUNTER — Encounter: Payer: Self-pay | Admitting: Emergency Medicine

## 2016-05-16 DIAGNOSIS — E785 Hyperlipidemia, unspecified: Secondary | ICD-10-CM | POA: Diagnosis present

## 2016-05-16 DIAGNOSIS — R079 Chest pain, unspecified: Secondary | ICD-10-CM | POA: Diagnosis not present

## 2016-05-16 DIAGNOSIS — Z8049 Family history of malignant neoplasm of other genital organs: Secondary | ICD-10-CM

## 2016-05-16 DIAGNOSIS — C3491 Malignant neoplasm of unspecified part of right bronchus or lung: Secondary | ICD-10-CM | POA: Diagnosis present

## 2016-05-16 DIAGNOSIS — Z90710 Acquired absence of both cervix and uterus: Secondary | ICD-10-CM | POA: Diagnosis not present

## 2016-05-16 DIAGNOSIS — I10 Essential (primary) hypertension: Secondary | ICD-10-CM | POA: Diagnosis present

## 2016-05-16 DIAGNOSIS — I248 Other forms of acute ischemic heart disease: Secondary | ICD-10-CM | POA: Diagnosis not present

## 2016-05-16 DIAGNOSIS — Z79899 Other long term (current) drug therapy: Secondary | ICD-10-CM

## 2016-05-16 DIAGNOSIS — C7951 Secondary malignant neoplasm of bone: Secondary | ICD-10-CM | POA: Diagnosis present

## 2016-05-16 DIAGNOSIS — D72829 Elevated white blood cell count, unspecified: Secondary | ICD-10-CM | POA: Diagnosis present

## 2016-05-16 DIAGNOSIS — K219 Gastro-esophageal reflux disease without esophagitis: Secondary | ICD-10-CM | POA: Diagnosis present

## 2016-05-16 DIAGNOSIS — I340 Nonrheumatic mitral (valve) insufficiency: Secondary | ICD-10-CM | POA: Diagnosis not present

## 2016-05-16 DIAGNOSIS — Z9889 Other specified postprocedural states: Secondary | ICD-10-CM

## 2016-05-16 DIAGNOSIS — G893 Neoplasm related pain (acute) (chronic): Secondary | ICD-10-CM | POA: Diagnosis present

## 2016-05-16 DIAGNOSIS — Z87891 Personal history of nicotine dependence: Secondary | ICD-10-CM

## 2016-05-16 DIAGNOSIS — Z886 Allergy status to analgesic agent status: Secondary | ICD-10-CM | POA: Diagnosis not present

## 2016-05-16 DIAGNOSIS — M549 Dorsalgia, unspecified: Secondary | ICD-10-CM

## 2016-05-16 LAB — URINALYSIS COMPLETE WITH MICROSCOPIC (ARMC ONLY)
Bacteria, UA: NONE SEEN
Bilirubin Urine: NEGATIVE
Glucose, UA: NEGATIVE mg/dL
Hgb urine dipstick: NEGATIVE
Ketones, ur: NEGATIVE mg/dL
Leukocytes, UA: NEGATIVE
Nitrite: NEGATIVE
Protein, ur: NEGATIVE mg/dL
Specific Gravity, Urine: 1.049 — ABNORMAL HIGH (ref 1.005–1.030)
pH: 7 (ref 5.0–8.0)

## 2016-05-16 LAB — CBC
HCT: 34.5 % — ABNORMAL LOW (ref 35.0–47.0)
Hemoglobin: 11.5 g/dL — ABNORMAL LOW (ref 12.0–16.0)
MCH: 26.6 pg (ref 26.0–34.0)
MCHC: 33.4 g/dL (ref 32.0–36.0)
MCV: 79.7 fL — ABNORMAL LOW (ref 80.0–100.0)
Platelets: 184 K/uL (ref 150–440)
RBC: 4.32 MIL/uL (ref 3.80–5.20)
RDW: 15.1 % — ABNORMAL HIGH (ref 11.5–14.5)
WBC: 46.9 K/uL — ABNORMAL HIGH (ref 3.6–11.0)

## 2016-05-16 LAB — DIFFERENTIAL
BAND NEUTROPHILS: 2 %
BASOS PCT: 0 %
Basophils Absolute: 0 10*3/uL (ref 0–0.1)
Blasts: 0 %
EOS ABS: 0.5 10*3/uL (ref 0–0.7)
Eosinophils Relative: 1 %
LYMPHS ABS: 9.8 10*3/uL — AB (ref 1.0–3.6)
Lymphocytes Relative: 21 %
METAMYELOCYTES PCT: 0 %
MONO ABS: 3.3 10*3/uL — AB (ref 0.2–0.9)
Monocytes Relative: 7 %
Myelocytes: 0 %
NEUTROS ABS: 33.3 10*3/uL — AB (ref 1.4–6.5)
Neutrophils Relative %: 69 %
OTHER: 0 %
PROMYELOCYTES ABS: 0 %
nRBC: 0 /100 WBC

## 2016-05-16 LAB — BASIC METABOLIC PANEL
ANION GAP: 7 (ref 5–15)
BUN: 11 mg/dL (ref 6–20)
CALCIUM: 8.8 mg/dL — AB (ref 8.9–10.3)
CO2: 27 mmol/L (ref 22–32)
Chloride: 105 mmol/L (ref 101–111)
Creatinine, Ser: 0.65 mg/dL (ref 0.44–1.00)
GLUCOSE: 116 mg/dL — AB (ref 65–99)
Potassium: 3.7 mmol/L (ref 3.5–5.1)
Sodium: 139 mmol/L (ref 135–145)

## 2016-05-16 LAB — TROPONIN I
TROPONIN I: 0.11 ng/mL — AB (ref <0.03)
Troponin I: 0.22 ng/mL

## 2016-05-16 LAB — CK: CK TOTAL: 34 U/L — AB (ref 38–234)

## 2016-05-16 MED ORDER — ONDANSETRON HCL 4 MG PO TABS: 4.0000 mg | ORAL_TABLET | Freq: Four times a day (QID) | ORAL | Status: DC | PRN

## 2016-05-16 MED ORDER — IOPAMIDOL (ISOVUE-370) INJECTION 76%: 100.0000 mL | Freq: Once | INTRAVENOUS | Status: DC | PRN

## 2016-05-16 MED ORDER — ACETAMINOPHEN 650 MG RE SUPP: 650.0000 mg | Freq: Four times a day (QID) | RECTAL | Status: DC | PRN

## 2016-05-16 MED ORDER — TRAMADOL HCL 50 MG PO TABS: 50.0000 mg | ORAL_TABLET | Freq: Four times a day (QID) | ORAL | Status: DC | PRN

## 2016-05-16 MED ORDER — ALBUTEROL SULFATE (2.5 MG/3ML) 0.083% IN NEBU: 2.5000 mg | INHALATION_SOLUTION | RESPIRATORY_TRACT | Status: DC | PRN

## 2016-05-16 MED ORDER — BISACODYL 10 MG RE SUPP: 10.0000 mg | Freq: Every day | RECTAL | Status: DC | PRN

## 2016-05-16 MED ORDER — POTASSIUM CHLORIDE CRYS ER 20 MEQ PO TBCR
20.0000 meq | EXTENDED_RELEASE_TABLET | Freq: Every day | ORAL | Status: DC
  Administered 2016-05-16 – 2016-05-17 (×2): 20 meq via ORAL
  Filled 2016-05-16 (×2): qty 1

## 2016-05-16 MED ORDER — ONDANSETRON HCL 4 MG/2ML IJ SOLN
4.0000 mg | Freq: Once | INTRAMUSCULAR | Status: AC
  Administered 2016-05-16: 4 mg via INTRAVENOUS
  Filled 2016-05-16: qty 2

## 2016-05-16 MED ORDER — SODIUM CHLORIDE 0.9% FLUSH: 3.0000 mL | Freq: Two times a day (BID) | INTRAVENOUS | Status: DC

## 2016-05-16 MED ORDER — LEVOFLOXACIN IN D5W 750 MG/150ML IV SOLN
750.0000 mg | INTRAVENOUS | Status: DC
  Administered 2016-05-16: 750 mg via INTRAVENOUS
  Filled 2016-05-16 (×2): qty 150

## 2016-05-16 MED ORDER — SODIUM CHLORIDE 0.9% FLUSH
3.0000 mL | Freq: Two times a day (BID) | INTRAVENOUS | Status: DC
  Administered 2016-05-16 – 2016-05-17 (×2): 3 mL via INTRAVENOUS

## 2016-05-16 MED ORDER — LORATADINE 10 MG PO TABS
10.0000 mg | ORAL_TABLET | Freq: Every day | ORAL | Status: DC
  Administered 2016-05-17: 10 mg via ORAL
  Filled 2016-05-16: qty 1

## 2016-05-16 MED ORDER — SODIUM CHLORIDE 0.9 % IV BOLUS (SEPSIS)
500.0000 mL | Freq: Once | INTRAVENOUS | Status: AC
  Administered 2016-05-16: 500 mL via INTRAVENOUS

## 2016-05-16 MED ORDER — GABAPENTIN 300 MG PO CAPS: 300.0000 mg | ORAL_CAPSULE | Freq: Three times a day (TID) | ORAL | 3 refills | Status: AC

## 2016-05-16 MED ORDER — GABAPENTIN 300 MG PO CAPS
300.0000 mg | ORAL_CAPSULE | Freq: Three times a day (TID) | ORAL | Status: DC
  Administered 2016-05-16 – 2016-05-17 (×2): 300 mg via ORAL
  Filled 2016-05-16 (×2): qty 1

## 2016-05-16 MED ORDER — PANTOPRAZOLE SODIUM 40 MG PO TBEC
40.0000 mg | DELAYED_RELEASE_TABLET | Freq: Two times a day (BID) | ORAL | Status: DC
  Administered 2016-05-16 – 2016-05-17 (×2): 40 mg via ORAL
  Filled 2016-05-16 (×2): qty 1

## 2016-05-16 MED ORDER — ONDANSETRON HCL 4 MG/2ML IJ SOLN: 4.0000 mg | Freq: Four times a day (QID) | INTRAMUSCULAR | Status: DC | PRN

## 2016-05-16 MED ORDER — MORPHINE SULFATE (PF) 4 MG/ML IV SOLN
4.0000 mg | Freq: Once | INTRAVENOUS | Status: AC
  Administered 2016-05-16: 4 mg via INTRAVENOUS
  Filled 2016-05-16: qty 1

## 2016-05-16 MED ORDER — ACETAMINOPHEN 325 MG PO TABS: 650.0000 mg | ORAL_TABLET | Freq: Four times a day (QID) | ORAL | Status: DC | PRN

## 2016-05-16 MED ORDER — ASPIRIN EC 81 MG PO TBEC
81.0000 mg | DELAYED_RELEASE_TABLET | Freq: Every day | ORAL | Status: DC
  Administered 2016-05-16 – 2016-05-17 (×2): 81 mg via ORAL
  Filled 2016-05-16 (×2): qty 1

## 2016-05-16 MED ORDER — OXYCODONE HCL 5 MG PO TABS
5.0000 mg | ORAL_TABLET | Freq: Three times a day (TID) | ORAL | Status: DC | PRN
  Administered 2016-05-16 – 2016-05-17 (×2): 5 mg via ORAL
  Filled 2016-05-16 (×2): qty 1

## 2016-05-16 MED ORDER — GABAPENTIN 300 MG PO CAPS: 300.0000 mg | ORAL_CAPSULE | Freq: Three times a day (TID) | ORAL | Status: DC

## 2016-05-16 MED ORDER — LISINOPRIL-HYDROCHLOROTHIAZIDE 10-12.5 MG PO TABS: 1.0000 | ORAL_TABLET | Freq: Every day | ORAL | Status: DC

## 2016-05-16 MED ORDER — ALPRAZOLAM 0.5 MG PO TABS
0.5000 mg | ORAL_TABLET | Freq: Two times a day (BID) | ORAL | Status: DC | PRN
  Administered 2016-05-17: 1 mg via ORAL
  Filled 2016-05-16: qty 2

## 2016-05-16 MED ORDER — HYDROCHLOROTHIAZIDE 12.5 MG PO CAPS: 12.5000 mg | ORAL_CAPSULE | Freq: Every day | ORAL | Status: DC

## 2016-05-16 MED ORDER — SIMVASTATIN 40 MG PO TABS
20.0000 mg | ORAL_TABLET | Freq: Every day | ORAL | Status: DC
Start: 2016-05-16 — End: 2016-05-17
  Administered 2016-05-16: 20 mg via ORAL
  Filled 2016-05-16: qty 1

## 2016-05-16 MED ORDER — ENOXAPARIN SODIUM 40 MG/0.4ML ~~LOC~~ SOLN
40.0000 mg | SUBCUTANEOUS | Status: DC
  Administered 2016-05-16: 40 mg via SUBCUTANEOUS
  Filled 2016-05-16: qty 0.4

## 2016-05-16 MED ORDER — LISINOPRIL 10 MG PO TABS
10.0000 mg | ORAL_TABLET | Freq: Every day | ORAL | Status: DC
  Filled 2016-05-16: qty 1

## 2016-05-16 MED ORDER — SODIUM CHLORIDE 0.9% FLUSH: 3.0000 mL | INTRAVENOUS | Status: DC | PRN

## 2016-05-16 MED ORDER — SODIUM CHLORIDE 0.9 % IV SOLN: 250.0000 mL | INTRAVENOUS | Status: DC | PRN

## 2016-05-16 MED ORDER — POLYETHYLENE GLYCOL 3350 17 G PO PACK: 17.0000 g | PACK | Freq: Every day | ORAL | Status: DC | PRN

## 2016-05-16 NOTE — ED Provider Notes (Signed)
[facility] Provider Note   ____________________________________________   None    (approximate)  I have reviewed the triage vital signs and the nursing notes.   HISTORY  Chief Complaint Back Pain and Chest Pain    HPI CYRAH MCLAMB is a 60 y.o. female with stage IV lung cancer with metastases to spine on palliative chemoradiation followed by Dr. Grayland Ormond, hypertension, hyperlipidemia who presents for evaluation of days of worsening chest pain radiating to her back, gradual onset, constant, moderate, no modifying factors. No shortness of breath. No vomiting, diarrhea, fevers or chills.   Past Medical History:  Diagnosis Date  . Anemia 01/21/2015  . 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 Anxiety   . GERD (gastroesophageal reflux disease)   . Hypercholesteremia   . Hypertension   . Last menstrual period (LMP) > 10 days ago 2007  . Lung cancer ([place])   . Metastatic lung cancer (metastasis from lung to other site) [facility]) 01/21/2015    Patient Active Problem List   Diagnosis Date Noted  . Metastatic lung cancer (metastasis from lung to other site) ([place]) 01/21/2015  . 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 Infection with methicillin-resistant Staphylococcus aureus 01/13/2015    Past Surgical History:  Procedure Laterality Date  . ABDOMINAL HYSTERECTOMY     partial  . INSERTION / PLACEMENT PLEURAL CATHETER    . PORTACATH PLACEMENT Right 04/26/2015   Procedure: INSERTION PORT-A-CATH;  Surgeon: Nestor Lewandowsky, MD;  Location: [facility] ORS;  Service: General;  Laterality: Right;    Prior to Admission medications   Medication Sig Start Date End Date Taking? Authorizing Provider  ALPRAZolam (XANAX) 1 MG tablet TAKE 1/2 BY MOUTH 1 TO 2 TIMES DAILY AS NEEDED FOR ANXIETY AND TAKE 1 BY MOUTH AT NIGHT FOR ANXIETY/SLEEP 05/15/16  Yes Lloyd Huger, MD  gabapentin (NEURONTIN) 300  MG capsule Take 1 capsule (300 mg total) by mouth 3 (three) times daily. 05/09/16  Yes Lloyd Huger, MD  lisinopril-hydrochlorothiazide (PRINZIDE,ZESTORETIC) 10-12.5 MG per tablet Take 1 tablet by mouth daily.   Yes Historical Provider, MD  loratadine (CLARITIN) 10 MG tablet Take 10 mg by mouth daily.   Yes Historical Provider, MD  Multiple Vitamins-Minerals (MULTIVITAMIN WITH MINERALS) tablet Take 1 tablet by mouth daily. 04/27/15  Yes Leia Alf, MD  omeprazole (PRILOSEC) 20 MG capsule Take 20 mg by mouth every morning.    Yes Historical Provider, MD  oxyCODONE (OXY IR/ROXICODONE) 5 MG immediate release tablet Take 1 tablet (5 mg total) by mouth 3 (three) times daily as needed for severe pain. Patient taking differently: Take 10 mg by mouth at bedtime.  04/27/16  Yes Lequita Asal, MD  potassium chloride SA (K-DUR,KLOR-CON) 20 MEQ tablet Take 1 tablet (20 mEq total) by mouth daily. 05/09/16  Yes Lloyd Huger, MD  simvastatin (ZOCOR) 20 MG tablet Take 20 mg by mouth at bedtime.    Yes Historical Provider, MD  lidocaine-prilocaine (EMLA) cream Apply cream 1 hour before chemotherapy treatment 10/19/15   Lloyd Huger, MD  traMADol (ULTRAM) 50 MG tablet Take 1 tablet (50 mg total) by mouth every 6 (six) hours as needed. 05/12/16   Lloyd Huger, MD    Allergies Tegaderm ag mesh [silver] and Aspirin  Family History  Problem Relation Age of Onset  . Cervical cancer Mother   . Uterine cancer Maternal Grandmother     Social History Social  History  Substance Use Topics  . Smoking status: Former Smoker    Packs/day: 0.25    Years: 8.00    Types: Cigarettes    Quit date: 07/20/2014  . Smokeless tobacco: Never Used  . Alcohol use No    Review of Systems Constitutional: No fever/chills Eyes: No visual changes. ENT: No sore throat. Cardiovascular: +chest pain. Respiratory: Denies shortness of breath. Gastrointestinal: No abdominal pain.  No nausea, no vomiting.  No  diarrhea.  No constipation. Genitourinary: Negative for dysuria. Musculoskeletal: Negative for back pain. Skin: Negative for rash. Neurological: Negative for headaches, focal weakness or numbness.  10-point ROS otherwise negative.  ____________________________________________   PHYSICAL EXAM:   Vitals:   05/16/16 1214 05/16/16 1407  BP: (!) 112/54 111/78  Pulse: (!) 101 86  Resp: 18 16  Temp: 98.4 F (36.9 C)   TempSrc: Oral   SpO2: 98% 99%  Weight: 166 lb (75.3 kg)   Height: '5\' 5"'$  (1.651 m)    VITAL SIGNS: ED Triage Vitals  Enc Vitals Group     BP 05/16/16 1214 (!) 112/54     Pulse Rate 05/16/16 1214 (!) 101     Resp 05/16/16 1214 18     Temp 05/16/16 1214 98.4 F (36.9 C)     Temp Source 05/16/16 1214 Oral     SpO2 05/16/16 1214 98 %     Weight 05/16/16 1214 166 lb (75.3 kg)     Height 05/16/16 1214 '5\' 5"'$  (1.651 m)     Head Circumference --      Peak Flow --      Pain Score 05/16/16 1215 5     Pain Loc --      Pain Edu? --      Excl. in [place]? --     Constitutional: Alert and oriented. Well appearing and in no acute distress. Eyes: Conjunctivae are normal. PERRL. EOMI. Head: Atraumatic. Nose: No congestion/rhinnorhea. Mouth/Throat: Mucous membranes are moist.  Oropharynx non-erythematous. Neck: No stridor. Suppler without meningismus. Cardiovascular: Normal rate, regular rhythm. Grossly normal heart sounds.  Good peripheral circulation. Respiratory: Normal respiratory effort.  No retractions. Lungs CTAB. Gastrointestinal: Soft and nontender. No distention. No CVA tenderness. Genitourinary: deferred Musculoskeletal: No lower extremity tenderness nor edema.  No joint effusions. No midline T or L-spine tenderness to palpation. Neurologic:  Normal speech and language. No gross focal neurologic deficits are appreciated. No gait instability. Skin:  Skin is warm, dry and intact. No rash noted. Psychiatric: Mood and affect are normal. Speech and behavior are  normal.  ____________________________________________   LABS (all labs ordered are listed, but only abnormal results are displayed)  Labs Reviewed  BASIC METABOLIC PANEL - Abnormal; Notable for the following:       Result Value   Glucose, Bld 116 (*)    Calcium 8.8 (*)    All other components within normal limits  CBC - Abnormal; Notable for the following:    WBC 46.9 (*)    Hemoglobin 11.5 (*)    HCT 34.5 (*)    MCV 79.7 (*)    RDW 15.1 (*)    All other components within normal limits  TROPONIN I - Abnormal; Notable for the following:    Troponin I 0.22 (*)    All other components within normal limits  URINALYSIS COMPLETEWITH MICROSCOPIC ([facility] ONLY)  DIFFERENTIAL   ____________________________________________  EKG  ED ECG REPORT I, [facility], the attending physician, personally viewed and interpreted this ECG.   Date:  05/16/2016  EKG Time: 12:20  Rate: 102  Rhythm: sinus tachycardia  Axis: normal  Intervals:none  ST&T Change: No acute ST elevation or acute ST depression.  ____________________________________________  RADIOLOGY  CXR IMPRESSION:  1. Diffuse pulmonary nodules, compatible with the innumerable  pulmonary nodules seen on earlier chest CT of 01/07/2016, with  probable superimposed interstitial thickening (interstitial edema  versus lymphangitic carcinomatosis). Suspect further progression of  disease since previous chest CT.  2. Small right pleural effusion appears stable in size compared to  the earlier chest CT.  3. No osseous abnormality identified, although lytic bone metastases  were identified on PET-CT of 04/05/2016 at the C6 and L2 vertebral  body levels.  4. Aortic atherosclerosis.    ____________________________________________   PROCEDURES  Procedure(s) performed: None  Procedures  Critical Care performed: No  ____________________________________________   INITIAL IMPRESSION / ASSESSMENT AND PLAN / ED  COURSE  Pertinent labs & imaging results that were available during my care of the patient were reviewed by me and considered in my medical decision making (see chart for details).  AVELEEN NEVERS is a 60 y.o. female with stage IV lung cancer with metastases to spine, hypertension, hyperlipidemia who presents for evaluation of days of worsening chest pain radiating to her back. On exam, she is generally well-appearing and in no acute distress. Mild tachycardic on arrival however this has resolved at the time I assessment and prior to any ER intervention. EKG is reassuring, neck is with acute ischemia. Troponin is elevated 0.22. Given the patient's complaint of chest pain radiating to the back, troponin elevation, will obtain CT angiochest to rule out dissection, we'll also obtain CT of the thoracic spine and lumbar spine given history of spinal metastasis and her complaint of back pain, this will help to evaluate for pathological fracture. She has no neurological deficit and no new neurological complaint. We'll treat her symptomatically and anticipate admission. Her white blood cell count today is elevated at 46,000, she has had similar leukocytosis earlier this month, this may be related to her chemotherapy, differential is pending. BMP is generally unremarkable. Urinalysis is pending. Imaging pending as above after which anticipate admission. Care transferred to Dr. Rip Harbour at 3:30 PM.  Clinical Course     ____________________________________________   FINAL CLINICAL IMPRESSION(S) / ED DIAGNOSES  Final diagnoses:  Chest pain, unspecified chest pain type  Back pain, unspecified location      NEW MEDICATIONS STARTED DURING THIS VISIT:  New Prescriptions   No medications on file     Note:  This document was prepared using Dragon voice recognition software and may include unintentional dictation errors.    Patricia Gavel, MD 05/16/16 [phone]

## 2016-05-16 NOTE — Telephone Encounter (Signed)
Per Dr Grayland Ormond, patient is to go to ER for MRI of brain and back pain work up. She agrees to go to ER

## 2016-05-16 NOTE — ED Notes (Signed)
Pt repeatedly reports "I am just not feeling right. I know I'm having radiation but this is worse then that"

## 2016-05-16 NOTE — H&P (Signed)
[facility] NAME: Lynnel Zanetti    MR#:  [id number]  DATE OF BIRTH:  [birth year]  DATE OF ADMISSION:  05/16/2016  PRIMARY CARE PHYSICIAN: Marguerita Merles, MD   REQUESTING/REFERRING PHYSICIAN: Dr. Edd Fabian  CHIEF COMPLAINT:   Chief Complaint  Patient presents with  . Back Pain  . Chest Pain    HISTORY OF PRESENT ILLNESS:  Teriann Livingood  is a 60 y.o. 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 Her chest pain lasted a few seconds. Not associated with nausea or vomiting or sweating or lightheadedness or palpitations. She has been using pain medications with back and upper back pain due to bone metastases from her lung cancer. Today her WBC count was elevated at 46,000 and troponin 0.22 and hospitalist team has been consulted. Patient did get Neulasta on 05/09/2016. Afebrile. No change in her respiratory status. No dysuria or abdominal pain. No rash. Patient due to malignant effusion did have right sided empyema in the past with the Pleurx catheter. This has been removed and patient has been doing well.   PAST MEDICAL HISTORY:   Past Medical History:  Diagnosis Date  . Anemia 01/21/2015  . 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 Anxiety   . GERD (gastroesophageal reflux disease)   . Hypercholesteremia   . Hypertension   . Last menstrual period (LMP) > 10 days ago 2007  . Lung cancer ([place])   . Metastatic lung cancer (metastasis from lung to other site) [facility]) 01/21/2015    PAST SURGICAL HISTORY:   Past Surgical History:  Procedure Laterality Date  . ABDOMINAL HYSTERECTOMY     partial  . INSERTION / PLACEMENT PLEURAL CATHETER    . PORTACATH PLACEMENT Right 04/26/2015   Procedure: INSERTION PORT-A-CATH;  Surgeon: Nestor Lewandowsky, MD;  Location: [facility] ORS;  Service: General;  Laterality: Right;    SOCIAL HISTORY:   Social History   Substance Use Topics  . Smoking status: Former Smoker    Packs/day: 0.25    Years: 8.00    Types: Cigarettes    Quit date: 07/20/2014  . Smokeless tobacco: Never Used  . Alcohol use No    FAMILY HISTORY:   Family History  Problem Relation Age of Onset  . Cervical cancer Mother   . Uterine cancer Maternal Grandmother     DRUG ALLERGIES:   Allergies  Allergen Reactions  . Tegaderm Ag Mesh [Silver] Other (See Comments)    blisters  . Aspirin Other (See Comments)    GIB    REVIEW OF SYSTEMS:   Review of Systems  Constitutional: Positive for malaise/fatigue. Negative for chills, fever and weight loss.  HENT: Negative for hearing loss and nosebleeds.   Eyes: Negative for blurred vision, double vision and pain.  Respiratory: Negative for cough, hemoptysis, sputum production, shortness of breath and wheezing.   Cardiovascular: Positive for chest pain. Negative for palpitations, orthopnea and leg swelling.  Gastrointestinal: Negative for abdominal pain, constipation, diarrhea, nausea and vomiting.  Genitourinary: Negative for dysuria and hematuria.  Musculoskeletal: Positive for back pain. Negative for falls and myalgias.  Skin: Negative for rash.  Neurological: Negative for dizziness, tremors, sensory change, speech change, focal weakness, seizures and headaches.  Endo/Heme/Allergies: Does not bruise/bleed easily.  Psychiatric/Behavioral: Negative for depression and memory loss. The patient is not nervous/anxious.     MEDICATIONS AT HOME:  Prior to Admission medications   Medication Sig Start Date End Date Taking? Authorizing Provider  ALPRAZolam (XANAX) 1 MG tablet TAKE 1/2 BY MOUTH 1 TO 2 TIMES DAILY AS NEEDED FOR ANXIETY AND TAKE 1 BY MOUTH AT NIGHT FOR ANXIETY/SLEEP 05/15/16  Yes Lloyd Huger, MD  lisinopril-hydrochlorothiazide (PRINZIDE,ZESTORETIC) 10-12.5 MG per tablet Take 1 tablet by mouth daily.   Yes Historical Provider, MD  loratadine (CLARITIN) 10 MG  tablet Take 10 mg by mouth daily.   Yes Historical Provider, MD  Multiple Vitamins-Minerals (MULTIVITAMIN WITH MINERALS) tablet Take 1 tablet by mouth daily. 04/27/15  Yes Leia Alf, MD  omeprazole (PRILOSEC) 20 MG capsule Take 20 mg by mouth every morning.    Yes Historical Provider, MD  oxyCODONE (OXY IR/ROXICODONE) 5 MG immediate release tablet Take 1 tablet (5 mg total) by mouth 3 (three) times daily as needed for severe pain. Patient taking differently: Take 10 mg by mouth at bedtime.  04/27/16  Yes Lequita Asal, MD  potassium chloride SA (K-DUR,KLOR-CON) 20 MEQ tablet Take 1 tablet (20 mEq total) by mouth daily. 05/09/16  Yes Lloyd Huger, MD  simvastatin (ZOCOR) 20 MG tablet Take 20 mg by mouth at bedtime.    Yes Historical Provider, MD  gabapentin (NEURONTIN) 300 MG capsule Take 1 capsule (300 mg total) by mouth 3 (three) times daily. 05/16/16   Lloyd Huger, MD  lidocaine-prilocaine (EMLA) cream Apply cream 1 hour before chemotherapy treatment 10/19/15   Lloyd Huger, MD  traMADol (ULTRAM) 50 MG tablet Take 1 tablet (50 mg total) by mouth every 6 (six) hours as needed. 05/12/16   Lloyd Huger, MD     VITAL SIGNS:  Blood pressure 111/78, pulse 86, temperature 98.4 F (36.9 C), temperature source Oral, resp. rate 16, height '5\' 5"'$  (1.651 m), weight 75.3 kg (166 lb), SpO2 99 %.  PHYSICAL EXAMINATION:  Physical Exam  GENERAL:  60 y.o.-year-old patient lying in the bed with no acute distress.  EYES: Pupils equal, round, reactive to light and accommodation. No scleral icterus. Extraocular muscles intact.  HEENT: Head atraumatic, normocephalic. Oropharynx and nasopharynx clear. No oropharyngeal erythema, moist oral mucosa  NECK:  Supple, no jugular venous distention. No thyroid enlargement, no tenderness.  LUNGS: Normal breath sounds bilaterally, no wheezing, rales, rhonchi. No use of accessory muscles of respiration.  CARDIOVASCULAR: S1, S2 normal. No murmurs,  rubs, or gallops.  ABDOMEN: Soft, nontender, nondistended. Bowel sounds present. No organomegaly or mass.  EXTREMITIES: No pedal edema, cyanosis, or clubbing. + 2 pedal & radial pulses b/l.   NEUROLOGIC: Cranial nerves II through XII are intact. No focal Motor or sensory deficits appreciated b/l PSYCHIATRIC: The patient is alert and oriented x 3. Good affect.  SKIN: No obvious rash, lesion, or ulcer.   LABORATORY PANEL:   CBC  Recent Labs Lab 05/16/16 1219  WBC 46.9*  HGB 11.5*  HCT 34.5*  PLT 184   ------------------------------------------------------------------------------------------------------------------  Chemistries   Recent Labs Lab 05/16/16 1219  NA 139  K 3.7  CL 105  CO2 27  GLUCOSE 116*  BUN 11  CREATININE 0.65  CALCIUM 8.8*   ------------------------------------------------------------------------------------------------------------------  Cardiac Enzymes  Recent Labs Lab 05/16/16 1219  TROPONINI 0.22*   ------------------------------------------------------------------------------------------------------------------  RADIOLOGY:  Dg Chest 2 View  Result Date: 05/16/2016 CLINICAL DATA:  Chest pain since last night and shortness of breath. History of hypertension and bilateral lung cancer diagnosed 3 years ago. Former smoker. EXAM: CHEST  2 VIEW COMPARISON:  Chest  CT dated 01/07/2016 and chest x-ray dated 04/26/2015. FINDINGS: Diffuse nodular densities are seen throughout both lungs as a component of diffuse bilateral reticulonodular opacities, compatible with the innumerable nodules seen on chest CT of 01/07/2016, with probable superimposed interstitial thickening (interstitial edema versus lymphangitic carcinomatosis). There is a small right pleural effusion which appears stable compared to the previous chest CT. No pneumothorax. Cardiomediastinal silhouette appears stable in size and configuration compared to the earlier chest x-ray. Atherosclerotic  changes again noted at the aortic arch. Right chest wall Port-A-Cath appears stable in position with tip at the level of the upper SVC. No acute-appearing osseous abnormality identified. Bone metastases were seen on PET-CT of 04/05/2016 at the C6 vertebral body and L2 vertebral body. IMPRESSION: 1. Diffuse pulmonary nodules, compatible with the innumerable pulmonary nodules seen on earlier chest CT of 01/07/2016, with probable superimposed interstitial thickening (interstitial edema versus lymphangitic carcinomatosis). Suspect further progression of disease since previous chest CT. 2. Small right pleural effusion appears stable in size compared to the earlier chest CT. 3. No osseous abnormality identified, although lytic bone metastases were identified on PET-CT of 04/05/2016 at the C6 and L2 vertebral body levels. 4. Aortic atherosclerosis. Electronically Signed   By: Franki Cabot M.D.   On: 05/16/2016 13:27   Ct Thoracic Spine Wo Contrast  Result Date: 05/16/2016 CLINICAL DATA:  60 year old female with severe spine pain radiating to her chest after bending. 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 Initial encounter. EXAM: CT THORACIC SPINE WITHOUT CONTRAST TECHNIQUE: Multidetector CT imaging of the thoracic spine was performed without intravenous contrast administration. Multiplanar CT image reconstructions were also generated. COMPARISON:  PET-CT 04/05/2016.  Cervical spine MRI 03/03/2016. FINDINGS: Partially visible right chest porta cath. Calcified aortic atherosclerosis. No definite pericardial effusion. Abnormal lung parenchyma with numerous bilateral pulmonary metastases. Confluent disease in the right lower lobe with superimposed small right pleural effusion. Right lung base appearance similar to that in July. Normal thoracic segmentation. No thoracic vertebral pathologic fracture. Thoracic disc and endplate degeneration most pronounced T6 through T9. Visible posterior ribs appear  intact. No destructive osseous lesion is evident in the thoracic spine. The left C5-C6 metastasis demonstrated in June is not included. The posterior thoracic paraspinal soft tissues appear normal. IMPRESSION: 1. No thoracic spine metastasis or pathologic fracture identified by CT. 2. Extensive pulmonary metastatic disease, severe at the right lung base which appears similar to the July PET-CT. 3. Lumbar spine CT from today reported separately. Electronically Signed   By: Genevie Ann M.D.   On: 05/16/2016 16:06   Ct Lumbar Spine Wo Contrast  Result Date: 05/16/2016 CLINICAL DATA:  60 year old female with severe spine pain radiating to her chest after bending. 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 Initial encounter. EXAM: CT LUMBAR SPINE WITHOUT CONTRAST TECHNIQUE: Multidetector CT imaging of the lumbar spine was performed without intravenous contrast administration. Multiplanar CT image reconstructions were also generated. COMPARISON:  Thoracic spine CT from today reported separately. PET-CT 04/05/2016. Lumbar MRI 03/03/2016. FINDINGS: Anterior superior L2 vertebral body metastasis demonstrated on the comparisons. There is evidence of an associated pathologic L2 superior endplate fracture (series 8, image 34) which is new since June. Height of the posterior L2 vertebral body remains preserved. The L2 pedicles and posterior elements appear intact. No associated kyphosis. The other lumbar levels appear intact. The visible sacrum appears intact. The right sacral ala metastasis suspected in June is not evident by CT. Superimposed lumbar spine degeneration (including severe facet degeneration at L4-L5), but no  CT evidence of lumbar spinal stenosis. Aortoiliac calcified atherosclerosis noted. Stable visualized abdominal viscera. No acute posterior paraspinal soft tissue finding. IMPRESSION: 1. There is a mild pathologic fracture of the left L2 superior endplate which appears to be new since June.  Perhaps this is the symptomatic abnormality. The posterior L2 vertebral body height is maintained, and no complicating features are identified. 2. No new osseous metastatic disease identified in the lumbar spine by CT. 3.  Calcified aortic atherosclerosis. Electronically Signed   By: Genevie Ann M.D.   On: 05/16/2016 16:12   Ct Angio Chest Aorta W And/or Wo Contrast  Result Date: 05/16/2016 CLINICAL DATA:  Severe low back pain when bending over. History of right lung small cell carcinoma. EXAM: CT ANGIOGRAPHY CHEST WITH CONTRAST TECHNIQUE: Multidetector CT imaging of the chest was performed using the standard protocol during bolus administration of intravenous contrast. Multiplanar CT image reconstructions and MIPs were obtained to evaluate the vascular anatomy. CONTRAST:  100 cc Isovue 370 IV COMPARISON:  PET CT 04/05/2016.  Chest CT 01/07/2016. FINDINGS: Cardiovascular: Diffuse aortic arch and descending thoracic aortic calcifications. No aneurysm for dissection. Heart is normal size. Although not performed to optimally to evaluate pulmonary arteries, no large or central pulmonary embolus seen. Mediastinum/Nodes: Mildly prominent bilateral hilar lymph nodes noted, as seen on prior PET CT. No mediastinal or axillary adenopathy. Lungs/Pleura: Innumerable bilateral pulmonary nodules compatible with metastatic disease which appears slightly improved in overall size and number of nodules when compared to PET CT. There is a small right pleural effusion it is stable. For instance, left upper lobe pulmonary nodule on image 11 has a short long axis diameter of 9 mm currently compared with 15 mm previously. Index right upper lobe pulmonary nodule on image 15 as a long axis diameter of 12 mm currently compared with 18 mm previously. Upper Abdomen: Imaging into the upper abdomen shows no acute findings. Musculoskeletal: Right chest wall Port-A-Cath remains in place, unchanged. Chest wall soft tissues are unremarkable. No  acute bony abnormality. Review of the MIP images confirms the above findings. IMPRESSION: Innumerable bilateral pulmonary nodules, improved in both size and number since prior study. Stable right pleural effusion. Diffuse aortic atherosclerosis. No evidence of aneurysm or dissection. No visible large central pulmonary embolus. Mildly prominent bilateral hilar lymph nodes, stable. Electronically Signed   By: Rolm Baptise M.D.   On: 05/16/2016 16:02     IMPRESSION AND PLAN:   * Elevated troponin likely demand ischemia. Check CK level to rule out rhabdomyolysis. Unlikely NSTEMI. Start aspirin and statin. Will need to start on heparin drip if troponin continues to trend up. Presently patient is chest pain-free. Check echocardiogram.  * Leukocytosis Likely due to recent dose of Neulasta and cancer. Unclear if patient has infiltrate on the CT scan due to pulmonary nodules. We'll start Levaquin. Consult oncology.  * Stage IV lung cancer on chemotherapy and radiation Consult oncology  * Hypertension Continue medications  * DVT prophylaxis with Lovenox   All the records are reviewed and case discussed with ED provider. Management plans discussed with the patient, family and they are in agreement.  CODE STATUS: FULL CODE  TOTAL TIME TAKING CARE OF THIS PATIENT: 40 minutes.   Hillary Bow R M.D on 05/16/2016 at 4:39 PM  Between 7am to 6pm - Pager - [phone]  After 6pm go to [email or web address] - password [email or web address] [facility]  Office  [phone]  CC: Primary care physician; Marguerita Merles, MD  Note: This  dictation was prepared with Dragon dictation along with smaller phrase technology. Any transcriptional errors that result from this process are unintentional.

## 2016-05-16 NOTE — Telephone Encounter (Signed)
90 day supply of gabapentin escribed to [facility]

## 2016-05-16 NOTE — Telephone Encounter (Signed)
Reports that she was bending over and had severe low back pain and when she stood up it went up into her chest. She does not have an appt until 9/12 and states she had a PET scan but has not heard the results form it yet. Please advise

## 2016-05-16 NOTE — Progress Notes (Signed)
Elevated troponins with EKG showing no acute changes. Due to metastatic lung cancer and elevated WBC, advise conservative treatment.

## 2016-05-16 NOTE — ED Notes (Signed)
Charge nurse notified of positive troponin

## 2016-05-16 NOTE — Progress Notes (Signed)
Pharmacy Antibiotic Note  Patricia Kelley is a 60 y.o. female admitted on 05/16/2016 with pneumonia.  Pharmacy has been consulted for levofloxacin dosing.  Plan: Levofloxacin '750mg'$  IV Q24H  Height: '5\' 5"'$  (165.1 cm) Weight: 166 lb (75.3 kg) IBW/kg (Calculated) : 57  Temp (24hrs), Avg:98.4 F (36.9 C), Min:98.4 F (36.9 C), Max:98.4 F (36.9 C)   Recent Labs Lab 05/16/16 1219  WBC 46.9*  CREATININE 0.65    Estimated Creatinine Clearance: 77.8 mL/min (by C-G formula based on SCr of 0.8 mg/dL).    Allergies  Allergen Reactions  . Tegaderm Ag Mesh [Silver] Other (See Comments)    blisters  . Aspirin Other (See Comments)    GIB    Antimicrobials this admission: 8/29 levofloxacin >>   Dose adjustments this admission:   Microbiology results:   Thank you for allowing pharmacy to be a part of this patient's care.  Brennan Karam C 05/16/2016 4:38 PM

## 2016-05-16 NOTE — ED Triage Notes (Signed)
Pt complains of pain to lower back and states today it radiated into center of chest. Pt was told by MD to come to ER for further eval. Pt states pain in chest is constant.

## 2016-05-17 ENCOUNTER — Inpatient Hospital Stay (HOSPITAL_COMMUNITY)
Admit: 2016-05-17 | Discharge: 2016-05-17 | Disposition: A | Discharge status: Home / Self Care | Payer: BLUE CROSS/BLUE SHIELD | Attending: Internal Medicine | Admitting: Internal Medicine

## 2016-05-17 DIAGNOSIS — I340 Nonrheumatic mitral (valve) insufficiency: Secondary | ICD-10-CM

## 2016-05-17 LAB — CBC
HCT: 32.1 % — ABNORMAL LOW (ref 35.0–47.0)
Hemoglobin: 10.8 g/dL — ABNORMAL LOW (ref 12.0–16.0)
MCH: 26.8 pg (ref 26.0–34.0)
MCHC: 33.7 g/dL (ref 32.0–36.0)
MCV: 79.5 fL — AB (ref 80.0–100.0)
PLATELETS: 173 10*3/uL (ref 150–440)
RBC: 4.04 MIL/uL (ref 3.80–5.20)
RDW: 14.8 % — AB (ref 11.5–14.5)
WBC: 69.2 10*3/uL — AB (ref 3.6–11.0)

## 2016-05-17 LAB — ECHOCARDIOGRAM COMPLETE
Height: 65 in
WEIGHTICAEL: 2768 [oz_av]

## 2016-05-17 LAB — BASIC METABOLIC PANEL
ANION GAP: 4 — AB (ref 5–15)
BUN: 9 mg/dL (ref 6–20)
CALCIUM: 8.4 mg/dL — AB (ref 8.9–10.3)
CO2: 26 mmol/L (ref 22–32)
CREATININE: 0.68 mg/dL (ref 0.44–1.00)
Chloride: 108 mmol/L (ref 101–111)
GFR calc Af Amer: >60 mL/min (ref >60)
GLUCOSE: 83 mg/dL (ref 65–99)
Potassium: 3.7 mmol/L (ref 3.5–5.1)
Sodium: 138 mmol/L (ref 135–145)

## 2016-05-17 LAB — TROPONIN I: TROPONIN I: 0.13 ng/mL — AB (ref <0.03)

## 2016-05-17 MED ORDER — OMEPRAZOLE 20 MG PO CPDR: 20.0000 mg | DELAYED_RELEASE_CAPSULE | Freq: Every day | ORAL | 0 refills | Status: AC

## 2016-05-17 NOTE — Progress Notes (Signed)
  SUBJECTIVE: Patricia Kelley is feeling much better this morning. SHe has no chest pain and her breathing is much better.   Vitals:   05/16/16 2109 05/16/16 2153 05/17/16 0425 05/17/16 0832  BP:  123/76 117/73 104/70  Pulse:  88 98   Resp:   16   Temp: 98.4 F (36.9 C)  97.7 F (36.5 C)   TempSrc: Oral  Oral   SpO2:   98%   Weight:   173 lb (78.5 kg)   Height:        Intake/Output Summary (Last 24 hours) at 05/17/16 0918 Last data filed at 05/17/16 0650  Gross per 24 hour  Intake              390 ml  Output              500 ml  Net             -110 ml    LABS: Basic Metabolic Panel:  Recent Labs  05/16/16 1219 05/17/16 0215  NA 139 138  K 3.7 3.7  CL 105 108  CO2 27 26  GLUCOSE 116* 83  BUN 11 9  CREATININE 0.65 0.68  CALCIUM 8.8* 8.4*   Liver Function Tests: No results for input(s): AST, ALT, ALKPHOS, BILITOT, PROT, ALBUMIN in the last 72 hours. No results for input(s): LIPASE, AMYLASE in the last 72 hours. CBC:  Recent Labs  05/16/16 1219 05/17/16 0215  WBC 46.9* 69.2*  NEUTROABS 33.3*  --   HGB 11.5* 10.8*  HCT 34.5* 32.1*  MCV 79.7* 79.5*  PLT 184 173   Cardiac Enzymes:  Recent Labs  05/16/16 1219 05/16/16 1813 05/17/16 0215  CKTOTAL  --  34*  --   TROPONINI 0.22* 0.11* 0.13*   BNP: Invalid input(s): POCBNP D-Dimer: No results for input(s): DDIMER in the last 72 hours. Hemoglobin A1C: No results for input(s): HGBA1C in the last 72 hours. Fasting Lipid Panel: No results for input(s): CHOL, HDL, LDLCALC, TRIG, CHOLHDL, LDLDIRECT in the last 72 hours. Thyroid Function Tests: No results for input(s): TSH, T4TOTAL, T3FREE, THYROIDAB in the last 72 hours.  Invalid input(s): FREET3 Anemia Panel: No results for input(s): VITAMINB12, FOLATE, FERRITIN, TIBC, IRON, RETICCTPCT in the last 72 hours.   PHYSICAL EXAM General: Well developed, well nourished, in no acute distress HEENT:  Normocephalic and atramatic Neck:  No JVD.  Lungs: Clear  bilaterally to auscultation and percussion. Heart: HRRR . Normal S1 and S2 without gallops or murmurs.  Abdomen: Bowel sounds are positive, abdomen soft and non-tender  Msk:  Back normal, normal gait. Normal strength and tone for age. Extremities: No clubbing, cyanosis or edema.   Neuro: Alert and oriented X 3. Psych:  Good affect, responds appropriately  TELEMETRY: Sinus rhythm in the 80's  ASSESSMENT AND PLAN: Mildly elevated troponins of 0.22, 0.11 and 0.13. Likely due to demand ischemia. Conservative treatment recommended due to metastatic lung cancer and elevated WBC.   Active Problems:   Chest pain    Daune Perch, NP 05/17/2016 9:18 AM

## 2016-05-17 NOTE — Progress Notes (Signed)
Discharge instructions along with home medication list and follow up gone over with patient. Printed prescription for prilosec given to patient. Patient verbalized understanding. Iv and telemetry removed. Patient discharged home with husband no distress noted

## 2016-05-17 NOTE — Progress Notes (Signed)
*  PRELIMINARY RESULTS* Echocardiogram 2D Echocardiogram has been performed.  Sherrie Sport 05/17/2016, 8:19 AM

## 2016-05-17 NOTE — Discharge Summary (Signed)
Patricia Kelley at [place] NAME: Patricia Kelley    MR#:  [id number]  DATE OF BIRTH:  June 19, 1956  DATE OF ADMISSION:  05/16/2016 ADMITTING PHYSICIAN: Hillary Bow, MD  DATE OF DISCHARGE: 05/17/16  PRIMARY CARE PHYSICIAN: Marguerita Merles, MD    ADMISSION DIAGNOSIS:  Chest pain, unspecified chest pain type [R07.9] Back pain, unspecified location [M54.9]  DISCHARGE DIAGNOSIS:  Active Problems:   Chest pain elevated troponin leukocytosis  SECONDARY DIAGNOSIS:   Past Medical History:  Diagnosis Date  . Anemia 01/21/2015  . 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 Anxiety   . GERD (gastroesophageal reflux disease)   . Hypercholesteremia   . Hypertension   . Last menstrual period (LMP) > 10 days ago 2007  . Lung cancer ([place])   . Metastatic lung cancer (metastasis from lung to other site) [facility]) 01/21/2015    HOSPITAL COURSE:  Patricia Kelley  is a 59 y.o. female admitted 05/16/2016 with chief complaint Back Pain and Chest Pain . Please see H&P performed by Hillary Bow, MD for further information. Patient presented with chest pain. Admitted to telemetry, cardiac enzymes trended - elevated but stable. Evaluated by cardiology, no indication for further inpatient work up. She has remained chest pain free throughout hospitalization.  Noted elevated WBCs - in setting of nulasta injection DISCHARGE CONDITIONS:   stable  CONSULTS OBTAINED:  Treatment Team:  Dionisio David, MD  DRUG ALLERGIES:   Allergies  Allergen Reactions  . Tegaderm Ag Mesh [Silver] Other (See Comments)    blisters  . Aspirin Other (See Comments)    GIB    DISCHARGE MEDICATIONS:   Current Discharge Medication List    CONTINUE these medications which have CHANGED   Details  omeprazole (PRILOSEC) 20 MG capsule Take 1 capsule (20 mg total) by mouth daily. Qty: 30 capsule, Refills: 0      CONTINUE these medications which have NOT CHANGED   Details  ALPRAZolam  (XANAX) 1 MG tablet TAKE 1/2 BY MOUTH 1 TO 2 TIMES DAILY AS NEEDED FOR ANXIETY AND TAKE 1 BY MOUTH AT NIGHT FOR ANXIETY/SLEEP Qty: 45 tablet, Refills: 0    lisinopril-hydrochlorothiazide (PRINZIDE,ZESTORETIC) 10-12.5 MG per tablet Take 1 tablet by mouth daily.   Associated Diagnoses: Metastatic lung cancer (metastasis from lung to other site), unspecified laterality (HCC)    loratadine (CLARITIN) 10 MG tablet Take 10 mg by mouth daily.    Multiple Vitamins-Minerals (MULTIVITAMIN WITH MINERALS) tablet Take 1 tablet by mouth daily. Qty: 30 tablet, Refills: 3   Associated Diagnoses: Non-small cell carcinoma of lung, stage 4, right (HCC)    oxyCODONE (OXY IR/ROXICODONE) 5 MG immediate release tablet Take 1 tablet (5 mg total) by mouth 3 (three) times daily as needed for severe pain. Qty: 30 tablet, Refills: 0    potassium chloride SA (K-DUR,KLOR-CON) 20 MEQ tablet Take 1 tablet (20 mEq total) by mouth daily. Qty: 90 tablet, Refills: 1   Associated Diagnoses: Metastatic lung cancer (metastasis from lung to other site), right (HCC)    simvastatin (ZOCOR) 20 MG tablet Take 20 mg by mouth at bedtime.     gabapentin (NEURONTIN) 300 MG capsule Take 1 capsule (300 mg total) by mouth 3 (three) times daily. Qty: 270 capsule, Refills: 3   Associated Diagnoses: Metastatic lung cancer (metastasis from lung to other site), right (HCC)    lidocaine-prilocaine (EMLA) cream Apply cream 1 hour before chemotherapy treatment Qty: 30 g, Refills: 1    traMADol (ULTRAM)  50 MG tablet Take 1 tablet (50 mg total) by mouth every 6 (six) hours as needed. Qty: 90 tablet, Refills: 1         DISCHARGE INSTRUCTIONS:    DIET:  Regular diet  DISCHARGE CONDITION:  Stable  ACTIVITY:  Activity as tolerated  OXYGEN:  Home Oxygen: No.   Oxygen Delivery: room air  DISCHARGE LOCATION:  home   If you experience worsening of your admission symptoms, develop shortness of breath, life threatening emergency,  suicidal or homicidal thoughts you must seek medical attention immediately by calling 911 or calling your MD immediately  if symptoms less severe.  You Must read complete instructions/literature along with all the possible adverse reactions/side effects for all the Medicines you take and that have been prescribed to you. Take any new Medicines after you have completely understood and accpet all the possible adverse reactions/side effects.   Please note  You were cared for by a hospitalist during your hospital stay. If you have any questions about your discharge medications or the care you received while you were in the hospital after you are discharged, you can call the unit and asked to speak with the hospitalist on call if the hospitalist that took care of you is not available. Once you are discharged, your primary care physician will handle any further medical issues. Please note that NO REFILLS for any discharge medications will be authorized once you are discharged, as it is imperative that you return to your primary care physician (or establish a relationship with a primary care physician if you do not have one) for your aftercare needs so that they can reassess your need for medications and monitor your lab values.    On the day of Discharge:   VITAL SIGNS:  Blood pressure (!) 106/56, pulse 98, temperature 97.7 F (36.5 C), temperature source Oral, resp. rate 16, height '5\' 5"'$  (1.651 m), weight 78.5 kg (173 lb), SpO2 98 %.  I/O:   Intake/Output Summary (Last 24 hours) at 05/17/16 1054 Last data filed at 05/17/16 1019  Gross per 24 hour  Intake              630 ml  Output              500 ml  Net              130 ml    PHYSICAL EXAMINATION:  GENERAL:  60 y.o.-year-old patient lying in the bed with no acute distress.  EYES: Pupils equal, round, reactive to light and accommodation. No scleral icterus. Extraocular muscles intact.  HEENT: Head atraumatic, normocephalic. Oropharynx and  nasopharynx clear.  NECK:  Supple, no jugular venous distention. No thyroid enlargement, no tenderness.  LUNGS: Normal breath sounds bilaterally, no wheezing, rales,rhonchi or crepitation. No use of accessory muscles of respiration.  CARDIOVASCULAR: S1, S2 normal. No murmurs, rubs, or gallops.  ABDOMEN: Soft, non-tender, non-distended. Bowel sounds present. No organomegaly or mass.  EXTREMITIES: No pedal edema, cyanosis, or clubbing.  NEUROLOGIC: Cranial nerves II through XII are intact. Muscle strength 5/5 in all extremities. Sensation intact. Gait not checked.  PSYCHIATRIC: The patient is alert and oriented x 3.  SKIN: No obvious rash, lesion, or ulcer.   DATA REVIEW:   CBC  Recent Labs Lab 05/17/16 0215  WBC 69.2*  HGB 10.8*  HCT 32.1*  PLT 173    Chemistries   Recent Labs Lab 05/17/16 0215  NA 138  K 3.7  CL 108  CO2  26  GLUCOSE 83  BUN 9  CREATININE 0.68  CALCIUM 8.4*    Cardiac Enzymes  Recent Labs Lab 05/17/16 0215  TROPONINI 0.13*    Microbiology Results  Results for orders placed or performed during the hospital encounter of 12/29/14  Culture, blood (single)     Status: None   Collection Time: 12/29/14  6:37 AM  Result Value Ref Range Status   Micro Text Report   Final       COMMENT                   NO GROWTH AEROBICALLY/ANAEROBICALLY IN 5 DAYS   ANTIBIOTIC                                                      Culture, blood (single)     Status: None   Collection Time: 12/29/14  6:50 AM  Result Value Ref Range Status   Micro Text Report   Final       COMMENT                   NO GROWTH AEROBICALLY/ANAEROBICALLY IN 5 DAYS   ANTIBIOTIC                                                      Body fluid culture     Status: None   Collection Time: 12/29/14  3:16 PM  Result Value Ref Range Status   Micro Text Report   Final       SOURCE: PLEURAL    ORGANISM 1                HEAVY GROWTH METHICILLIN RESISTANT STAPH.AUREUS   COMMENT                    NO ANAEROBES ISOLATED IN 4 DAYS   GRAM STAIN                FEW WHITE BLOOD CELLS   GRAM STAIN                MODERATE RED BLOOD CELLS   GRAM STAIN                MODERATE GRAM POSITIVE COCCI IN PAIRS IN CLUSTERS   ANTIBIOTIC                    ORG#1     CIPROFLOXACIN                 R         CLINDAMYCIN                   S         ERYTHROMYCIN                  R         GENTAMICIN                    S         LEVOFLOXACIN  R         LINEZOLID                     S         OXACILLIN                     R         TIGECYCLINE                   S         VANCOMYCIN                    S         Cefoxitin Screen              POSITIVE  Inducible Clindamycin ResistanNEGATIVE  Trimethoprim/Sulfamethoxazole S           Culture, blood (single)     Status: None   Collection Time: 01/02/15  2:55 PM  Result Value Ref Range Status   Micro Text Report   Final       SOURCE: #1 LEFT ac    COMMENT                   NO GROWTH AEROBICALLY/ANAEROBICALLY IN 5 DAYS   ANTIBIOTIC                                                      Culture, blood (single)     Status: None   Collection Time: 01/02/15  3:00 PM  Result Value Ref Range Status   Micro Text Report   Final       SOURCE: #2 RIGHT ac    COMMENT                   NO GROWTH AEROBICALLY/ANAEROBICALLY IN 5 DAYS   ANTIBIOTIC                                                        RADIOLOGY:  Dg Chest 2 View  Result Date: 05/16/2016 CLINICAL DATA:  Chest pain since last night and shortness of breath. History of hypertension and bilateral lung cancer diagnosed 3 years ago. Former smoker. EXAM: CHEST  2 VIEW COMPARISON:  Chest CT dated 01/07/2016 and chest x-ray dated 04/26/2015. FINDINGS: Diffuse nodular densities are seen throughout both lungs as a component of diffuse bilateral reticulonodular opacities, compatible with the innumerable nodules seen on chest CT of 01/07/2016, with probable superimposed interstitial  thickening (interstitial edema versus lymphangitic carcinomatosis). There is a small right pleural effusion which appears stable compared to the previous chest CT. No pneumothorax. Cardiomediastinal silhouette appears stable in size and configuration compared to the earlier chest x-ray. Atherosclerotic changes again noted at the aortic arch. Right chest wall Port-A-Cath appears stable in position with tip at the level of the upper SVC. No acute-appearing osseous abnormality identified. Bone metastases were seen on PET-CT of 04/05/2016 at the C6 vertebral body and L2 vertebral body. IMPRESSION: 1. Diffuse pulmonary nodules, compatible with the innumerable pulmonary nodules seen on earlier chest CT of 01/07/2016, with probable  superimposed interstitial thickening (interstitial edema versus lymphangitic carcinomatosis). Suspect further progression of disease since previous chest CT. 2. Small right pleural effusion appears stable in size compared to the earlier chest CT. 3. No osseous abnormality identified, although lytic bone metastases were identified on PET-CT of 04/05/2016 at the C6 and L2 vertebral body levels. 4. Aortic atherosclerosis. Electronically Signed   By: Franki Cabot M.D.   On: 05/16/2016 13:27   Ct Thoracic Spine Wo Contrast  Result Date: 05/16/2016 CLINICAL DATA:  60 year old female with severe spine pain radiating to her chest after bending. 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 Initial encounter. EXAM: CT THORACIC SPINE WITHOUT CONTRAST TECHNIQUE: Multidetector CT imaging of the thoracic spine was performed without intravenous contrast administration. Multiplanar CT image reconstructions were also generated. COMPARISON:  PET-CT 04/05/2016.  Cervical spine MRI 03/03/2016. FINDINGS: Partially visible right chest porta cath. Calcified aortic atherosclerosis. No definite pericardial effusion. Abnormal lung parenchyma with numerous bilateral pulmonary metastases. Confluent  disease in the right lower lobe with superimposed small right pleural effusion. Right lung base appearance similar to that in July. Normal thoracic segmentation. No thoracic vertebral pathologic fracture. Thoracic disc and endplate degeneration most pronounced T6 through T9. Visible posterior ribs appear intact. No destructive osseous lesion is evident in the thoracic spine. The left C5-C6 metastasis demonstrated in June is not included. The posterior thoracic paraspinal soft tissues appear normal. IMPRESSION: 1. No thoracic spine metastasis or pathologic fracture identified by CT. 2. Extensive pulmonary metastatic disease, severe at the right lung base which appears similar to the July PET-CT. 3. Lumbar spine CT from today reported separately. Electronically Signed   By: Genevie Ann M.D.   On: 05/16/2016 16:06   Ct Lumbar Spine Wo Contrast  Result Date: 05/16/2016 CLINICAL DATA:  60 year old female with severe spine pain radiating to her chest after bending. 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 Initial encounter. EXAM: CT LUMBAR SPINE WITHOUT CONTRAST TECHNIQUE: Multidetector CT imaging of the lumbar spine was performed without intravenous contrast administration. Multiplanar CT image reconstructions were also generated. COMPARISON:  Thoracic spine CT from today reported separately. PET-CT 04/05/2016. Lumbar MRI 03/03/2016. FINDINGS: Anterior superior L2 vertebral body metastasis demonstrated on the comparisons. There is evidence of an associated pathologic L2 superior endplate fracture (series 8, image 34) which is new since June. Height of the posterior L2 vertebral body remains preserved. The L2 pedicles and posterior elements appear intact. No associated kyphosis. The other lumbar levels appear intact. The visible sacrum appears intact. The right sacral ala metastasis suspected in June is not evident by CT. Superimposed lumbar spine degeneration (including severe facet degeneration at  L4-L5), but no CT evidence of lumbar spinal stenosis. Aortoiliac calcified atherosclerosis noted. Stable visualized abdominal viscera. No acute posterior paraspinal soft tissue finding. IMPRESSION: 1. There is a mild pathologic fracture of the left L2 superior endplate which appears to be new since June. Perhaps this is the symptomatic abnormality. The posterior L2 vertebral body height is maintained, and no complicating features are identified. 2. No new osseous metastatic disease identified in the lumbar spine by CT. 3.  Calcified aortic atherosclerosis. Electronically Signed   By: Genevie Ann M.D.   On: 05/16/2016 16:12   Ct Angio Chest Aorta W And/or Wo Contrast  Result Date: 05/16/2016 CLINICAL DATA:  Severe low back pain when bending over. History of right lung small cell carcinoma. EXAM: CT ANGIOGRAPHY CHEST WITH CONTRAST TECHNIQUE: Multidetector CT imaging of the chest was performed using the standard protocol during  bolus administration of intravenous contrast. Multiplanar CT image reconstructions and MIPs were obtained to evaluate the vascular anatomy. CONTRAST:  100 cc Isovue 370 IV COMPARISON:  PET CT 04/05/2016.  Chest CT 01/07/2016. FINDINGS: Cardiovascular: Diffuse aortic arch and descending thoracic aortic calcifications. No aneurysm for dissection. Heart is normal size. Although not performed to optimally to evaluate pulmonary arteries, no large or central pulmonary embolus seen. Mediastinum/Nodes: Mildly prominent bilateral hilar lymph nodes noted, as seen on prior PET CT. No mediastinal or axillary adenopathy. Lungs/Pleura: Innumerable bilateral pulmonary nodules compatible with metastatic disease which appears slightly improved in overall size and number of nodules when compared to PET CT. There is a small right pleural effusion it is stable. For instance, left upper lobe pulmonary nodule on image 11 has a short long axis diameter of 9 mm currently compared with 15 mm previously. Index right  upper lobe pulmonary nodule on image 15 as a long axis diameter of 12 mm currently compared with 18 mm previously. Upper Abdomen: Imaging into the upper abdomen shows no acute findings. Musculoskeletal: Right chest wall Port-A-Cath remains in place, unchanged. Chest wall soft tissues are unremarkable. No acute bony abnormality. Review of the MIP images confirms the above findings. IMPRESSION: Innumerable bilateral pulmonary nodules, improved in both size and number since prior study. Stable right pleural effusion. Diffuse aortic atherosclerosis. No evidence of aneurysm or dissection. No visible large central pulmonary embolus. Mildly prominent bilateral hilar lymph nodes, stable. Electronically Signed   By: Rolm Baptise M.D.   On: 05/16/2016 16:02     Management plans discussed with the patient, family and they are in agreement.  CODE STATUS:     Code Status Orders        Start     Ordered   05/16/16 1612  Full code  Continuous     05/16/16 1613    Code Status History    Date Active Date Inactive Code Status Order ID Comments User Context   05/16/2016  4:13 PM 05/17/2016  7:49 AM Full Code [id number]  Hillary Bow, MD ED    Advance Directive Documentation   Flowsheet Row Most Recent Value  Type of Advance Directive  Healthcare Power of Attorney  Pre-existing out of facility DNR order (yellow form or pink MOST form)  No data  "MOST" Form in Place?  No data      TOTAL TIME TAKING CARE OF THIS PATIENT: 33 minutes.    Hower,  Karenann Cai.D on 05/17/2016 at 10:54 AM  Between 7am to 6pm - Pager - [phone]  After 6pm go to [email or web address] - Proofreader  [facility]  Office  [phone]  CC: Primary care physician; Marguerita Merles, MD

## 2016-05-17 NOTE — Progress Notes (Signed)
MD Marcille Blanco aware of ne WBC level of 69.2 no new orders received.

## 2016-05-29 NOTE — Progress Notes (Signed)
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 Her performance status continues to improve. She does not complain of left arm pain, but continues to have tingling that is helped with gabapentin. She continues to have left arm pain. She does not complain of headache today. She denies any recent fevers. She has a good appetite and denies weight loss. She denies any chest pain or tightness, hemoptysis, or shortness of breath. She denies any nausea, vomiting, diarrhea or constipation.  Patient offers no further specific complaints today.   REVIEW OF SYSTEMS:   Review of Systems  Constitutional: Positive for malaise/fatigue. Negative for diaphoresis, fever and weight loss.  Eyes: Negative.   Respiratory: Negative.   Cardiovascular: Negative for chest pain, palpitations and leg swelling.  Gastrointestinal: Negative for abdominal pain, blood in stool, diarrhea, melena and nausea.  Genitourinary: Negative.   Musculoskeletal: Positive for back pain.       Left arm "tingling"  Skin: Negative.   Neurological: Positive for weakness.  Psychiatric/Behavioral: Negative.     As per HPI. Otherwise, a complete review of systems is negative.   PAST MEDICAL HISTORY: Past Medical History:  Diagnosis Date  . Anemia 01/21/2015  . 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 Anxiety   . GERD (gastroesophageal reflux disease)   . Hypercholesteremia   . Hypertension   . Last menstrual period (LMP) > 10 days ago 2007  . Lung cancer ([place])   . Metastatic lung cancer (metastasis from lung to other site)  [facility]) 01/21/2015    PAST SURGICAL HISTORY: Past Surgical History:  Procedure Laterality Date  . ABDOMINAL HYSTERECTOMY     partial  . INSERTION / PLACEMENT PLEURAL CATHETER    . PORTACATH PLACEMENT Right 04/26/2015   Procedure: INSERTION PORT-A-CATH;  Surgeon: Nestor Lewandowsky, MD;  Location: [facility] ORS;  Service: General;  Laterality: Right;    FAMILY HISTORY Family History  Problem Relation Age of Onset  . Cervical cancer Mother   . Uterine cancer Maternal Grandmother     GYNECOLOGIC HISTORY:  No LMP recorded. Patient has had a hysterectomy.     ADVANCED DIRECTIVES:    HEALTH MAINTENANCE: Social History  Substance Use Topics  . Smoking status: Former Smoker    Packs/day: 0.25    Years: 8.00    Types: Cigarettes    Quit date: 07/20/2014  . Smokeless tobacco: Never Used  . Alcohol use No    Allergies  Allergen Reactions  . Tegaderm Ag Mesh [Silver] Other (See Comments)    blisters  . Aspirin Other (See Comments)    GIB    Current Outpatient Prescriptions  Medication Sig Dispense Refill  . ALPRAZolam (XANAX) 1 MG tablet TAKE 1/2 BY MOUTH 1 TO 2 TIMES DAILY AS NEEDED FOR ANXIETY AND TAKE 1 BY MOUTH AT NIGHT FOR ANXIETY/SLEEP 45 tablet 0  . gabapentin (NEURONTIN) 300 MG capsule Take 1 capsule (300 mg total) by mouth 3 (three) times daily. 270 capsule 3  . lidocaine-prilocaine (EMLA) cream Apply cream 1 hour before chemotherapy treatment 30 g 1  . lisinopril-hydrochlorothiazide (PRINZIDE,ZESTORETIC) 10-12.5 MG per tablet Take 1 tablet by mouth  daily.    . loratadine (CLARITIN) 10 MG tablet Take 10 mg by mouth daily.    . Multiple Vitamins-Minerals (MULTIVITAMIN WITH MINERALS) tablet Take 1 tablet by mouth daily. 30 tablet 3  . omeprazole (PRILOSEC) 20 MG capsule Take 1 capsule (20 mg total) by mouth daily. 30 capsule 0  . oxyCODONE (OXY IR/ROXICODONE) 5 MG immediate release tablet Take 1 tablet (5 mg total) by mouth 3 (three) times daily as needed for severe pain. (Patient  taking differently: Take 10 mg by mouth at bedtime. ) 30 tablet 0  . potassium chloride SA (K-DUR,KLOR-CON) 20 MEQ tablet Take 1 tablet (20 mEq total) by mouth daily. 90 tablet 1  . simvastatin (ZOCOR) 20 MG tablet Take 20 mg by mouth at bedtime.     . traMADol (ULTRAM) 50 MG tablet Take 1 tablet (50 mg total) by mouth every 6 (six) hours as needed. 90 tablet 1   No current facility-administered medications for this visit.    Facility-Administered Medications Ordered in Other Visits  Medication Dose Route Frequency Provider Last Rate Last Dose  . heparin lock flush 100 unit/mL  250 Units Intracatheter PRN Leslie F Herring, NP      . [COMPLETED] heparin lock flush 100 unit/mL  500 Units Intracatheter Once PRN Timothy J Finnegan, MD   500 Units at 05/30/16 1205  . [COMPLETED] pegfilgrastim (NEULASTA ONPRO KIT) injection 6 mg  6 mg Subcutaneous Once Timothy J Finnegan, MD   6 mg at 05/30/16 1205  . sodium chloride 0.9 % injection 10 mL  10 mL Intracatheter PRN Leslie F Herring, NP      . sodium chloride 0.9 % injection 10 mL  10 mL Intravenous PRN Sandeep Pandit, MD   10 mL at 04/27/15 0905  . sodium chloride 0.9 % injection 3 mL  3 mL Intracatheter PRN Leslie F Herring, NP        OBJECTIVE: BP 112/78 (BP Location: Left Arm, Patient Position: Sitting)   Pulse 98   Temp (!) 95 F (35 C) (Tympanic)   Resp 18   Wt 173 lb 8 oz (78.7 kg)   BMI 28.87 kg/m    Body mass index is 28.87 kg/m.    ECOG FS:1 - Symptomatic but completely ambulatory  General: Well-developed, well-nourished, no acute distress. Eyes: Pink conjunctiva, anicteric sclera. Lungs: Clear to auscultation bilaterally. Heart: Regular rate and rhythm. No rubs, murmurs, or gallops. Musculoskeletal: No edemal, No cyanosis, or clubbing.  Neuro: Alert, answering all questions appropriately.  Skin: No rashes or petechiae noted. Psych: Normal affect.    LAB RESULTS:  Clinical Support on 05/30/2016  Component Date Value Ref  Range Status  . WBC 05/30/2016 11.1* 3.6 - 11.0 K/uL Final  . RBC 05/30/2016 4.09  3.80 - 5.20 MIL/uL Final  . Hemoglobin 05/30/2016 11.0* 12.0 - 16.0 g/dL Final  . HCT 05/30/2016 32.9* 35.0 - 47.0 % Final  . MCV 05/30/2016 80.6  80.0 - 100.0 fL Final  . MCH 05/30/2016 26.9  26.0 - 34.0 pg Final  . MCHC 05/30/2016 33.4  32.0 - 36.0 g/dL Final  . RDW 05/30/2016 17.6* 11.5 - 14.5 % Final  . Platelets 05/30/2016 271  150 - 440 K/uL Final  . Neutrophils Relative % 05/30/2016 73  % Final  . Neutro Abs 05/30/2016 8.2* 1.4 - 6.5 K/uL Final  . Lymphocytes Relative 05/30/2016 15  % Final  . Lymphs Abs 05/30/2016 1.7  1.0 - 3.6 K/uL Final  . Monocytes Relative 05/30/2016 10  %   Final  . Monocytes Absolute 05/30/2016 1.1* 0.2 - 0.9 K/uL Final  . Eosinophils Relative 05/30/2016 1  % Final  . Eosinophils Absolute 05/30/2016 0.1  0 - 0.7 K/uL Final  . Basophils Relative 05/30/2016 1  % Final  . Basophils Absolute 05/30/2016 0.1  0 - 0.1 K/uL Final  . Sodium 05/30/2016 135  135 - 145 mmol/L Final  . Potassium 05/30/2016 3.3* 3.5 - 5.1 mmol/L Final  . Chloride 05/30/2016 105  101 - 111 mmol/L Final  . CO2 05/30/2016 26  22 - 32 mmol/L Final  . Glucose, Bld 05/30/2016 138* 65 - 99 mg/dL Final  . BUN 05/30/2016 16  6 - 20 mg/dL Final  . Creatinine, Ser 05/30/2016 0.63  0.44 - 1.00 mg/dL Final  . Calcium 05/30/2016 8.5* 8.9 - 10.3 mg/dL Final  . Total Protein 05/30/2016 5.8* 6.5 - 8.1 g/dL Final  . Albumin 05/30/2016 3.2* 3.5 - 5.0 g/dL Final  . AST 05/30/2016 21  15 - 41 U/L Final  . ALT 05/30/2016 12* 14 - 54 U/L Final  . Alkaline Phosphatase 05/30/2016 66  38 - 126 U/L Final  . Total Bilirubin 05/30/2016 0.7  0.3 - 1.2 mg/dL Final  . GFR calc non Af Amer 05/30/2016 >60  >60 mL/min Final  . GFR calc Af Amer 05/30/2016 >60  >60 mL/min Final   Comment: (NOTE) The eGFR has been calculated using the CKD EPI equation. This calculation has not been validated in all clinical situations. eGFR's  persistently <60 mL/min signify possible Chronic Kidney Disease.   . Anion gap 05/30/2016 4* 5 - 15 Final    STUDIES:  IMPRESSION: 1. New and enlarging bilateral pulmonary nodules. Nodules are currently too numerous to count. An index nodule in the left lower lobe is increased from 1.2 cm diameter to 1.8 cm diameter. 2. Centrilobular emphysema with airway thickening. 3. 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 4. Small right pleural effusion, likely malignant. 5. 1.5 by 1.8 cm hyperdense exophytic left kidney upper pole lesion, complex cyst versus mass. Not visibly hypermetabolic on prior PET-CT, and with similar hyperdensity on portal venous and delayed phase images.  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 On 12/01/14 - Right Pleural Fluid Cytology. POSITIVE FOR MALIGNANT CELLS. ADENOCARCINOMA, CONSISTENT WITH LUNG ORIGIN. Molecular testing for EGFR, ALK and others is negative  PLAN:   1. 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  Patient also progressed on nivolumab. Previously patient was tested and no driver mutation present, therefore will proceed with cycle 3 of single agent Taxotere 75 mg/m every 3 weeks. Will also use OnPro Neulasta for white blood cell count support.  Will reimage after 4 treatments. Return to clinic in 3 weeks for further evaluation and consideration of cycle 4.  2. Headaches: MRI the brain completed on March 03, 2016 is essentially unchanged from previous.  3. Pain (back, hip, arm): MRI and bone scan results reviewed independently revealing metastatic disease in C6 there is likely causing her symptoms. Lumbar MRI revealed an enhancing lesion and L2  as well as a suspicious 12 mm lesion and right sacrum. Patient has completed XRT. Continue gabapentin 300 mg 3 times per day. Continue 10 mg oxycodone as needed.  4. Kidney lesion:  Unclear if metastatic deposit or second primary of the left kidney. Continue to monitor closely.  5. Anxiety: Resolved. Continue Xanax 1 mg at night and 0.5 mg as needed during the day.     6. Hypokalemia: Continue oral potassium supplement to 20 meq daily.     7. Bony lesions: Consider Zometa in the future.   Timothy J Finnegan, MD 05/30/16 12:01 PM     

## 2016-05-30 ENCOUNTER — Inpatient Hospital Stay: Payer: BLUE CROSS/BLUE SHIELD

## 2016-05-30 ENCOUNTER — Inpatient Hospital Stay: Payer: BLUE CROSS/BLUE SHIELD | Attending: Oncology | Admitting: Oncology

## 2016-05-30 VITALS — BP 112/78 | HR 98 | Temp 95.0°F | Resp 18 | Wt 173.5 lb

## 2016-05-30 DIAGNOSIS — Z7689 Persons encountering health services in other specified circumstances: Secondary | ICD-10-CM | POA: Insufficient documentation

## 2016-05-30 DIAGNOSIS — R531 Weakness: Secondary | ICD-10-CM

## 2016-05-30 DIAGNOSIS — G893 Neoplasm related pain (acute) (chronic): Secondary | ICD-10-CM

## 2016-05-30 DIAGNOSIS — R5381 Other malaise: Secondary | ICD-10-CM

## 2016-05-30 DIAGNOSIS — Z5111 Encounter for antineoplastic chemotherapy: Secondary | ICD-10-CM | POA: Insufficient documentation

## 2016-05-30 DIAGNOSIS — C7802 Secondary malignant neoplasm of left lung: Secondary | ICD-10-CM

## 2016-05-30 DIAGNOSIS — Z79899 Other long term (current) drug therapy: Secondary | ICD-10-CM | POA: Diagnosis not present

## 2016-05-30 DIAGNOSIS — E7800 Pure hypercholesterolemia, unspecified: Secondary | ICD-10-CM

## 2016-05-30 DIAGNOSIS — R202 Paresthesia of skin: Secondary | ICD-10-CM

## 2016-05-30 DIAGNOSIS — Z87891 Personal history of nicotine dependence: Secondary | ICD-10-CM

## 2016-05-30 DIAGNOSIS — M549 Dorsalgia, unspecified: Secondary | ICD-10-CM

## 2016-05-30 DIAGNOSIS — J432 Centrilobular emphysema: Secondary | ICD-10-CM

## 2016-05-30 DIAGNOSIS — C3491 Malignant neoplasm of unspecified part of right bronchus or lung: Secondary | ICD-10-CM | POA: Diagnosis present

## 2016-05-30 DIAGNOSIS — R918 Other nonspecific abnormal finding of lung field: Secondary | ICD-10-CM | POA: Diagnosis not present

## 2016-05-30 DIAGNOSIS — K219 Gastro-esophageal reflux disease without esophagitis: Secondary | ICD-10-CM

## 2016-05-30 DIAGNOSIS — F419 Anxiety disorder, unspecified: Secondary | ICD-10-CM

## 2016-05-30 DIAGNOSIS — J910 Malignant pleural effusion: Secondary | ICD-10-CM | POA: Diagnosis not present

## 2016-05-30 DIAGNOSIS — C7931 Secondary malignant neoplasm of brain: Secondary | ICD-10-CM

## 2016-05-30 DIAGNOSIS — C7951 Secondary malignant neoplasm of bone: Secondary | ICD-10-CM | POA: Insufficient documentation

## 2016-05-30 DIAGNOSIS — Z7951 Long term (current) use of inhaled steroids: Secondary | ICD-10-CM

## 2016-05-30 DIAGNOSIS — I10 Essential (primary) hypertension: Secondary | ICD-10-CM | POA: Diagnosis not present

## 2016-05-30 DIAGNOSIS — R5383 Other fatigue: Secondary | ICD-10-CM | POA: Diagnosis not present

## 2016-05-30 DIAGNOSIS — M79602 Pain in left arm: Secondary | ICD-10-CM

## 2016-05-30 DIAGNOSIS — E876 Hypokalemia: Secondary | ICD-10-CM | POA: Diagnosis not present

## 2016-05-30 DIAGNOSIS — Z8049 Family history of malignant neoplasm of other genital organs: Secondary | ICD-10-CM | POA: Insufficient documentation

## 2016-05-30 DIAGNOSIS — R51 Headache: Secondary | ICD-10-CM

## 2016-05-30 LAB — CBC WITH DIFFERENTIAL/PLATELET
Basophils Absolute: 0.1 10*3/uL (ref 0–0.1)
Basophils Relative: 1 %
Eosinophils Absolute: 0.1 10*3/uL (ref 0–0.7)
Eosinophils Relative: 1 %
HEMATOCRIT: 32.9 % — AB (ref 35.0–47.0)
Hemoglobin: 11 g/dL — ABNORMAL LOW (ref 12.0–16.0)
LYMPHS ABS: 1.7 10*3/uL (ref 1.0–3.6)
LYMPHS PCT: 15 %
MCH: 26.9 pg (ref 26.0–34.0)
MCHC: 33.4 g/dL (ref 32.0–36.0)
MCV: 80.6 fL (ref 80.0–100.0)
MONO ABS: 1.1 10*3/uL — AB (ref 0.2–0.9)
MONOS PCT: 10 %
NEUTROS ABS: 8.2 10*3/uL — AB (ref 1.4–6.5)
Neutrophils Relative %: 73 %
Platelets: 271 10*3/uL (ref 150–440)
RBC: 4.09 MIL/uL (ref 3.80–5.20)
RDW: 17.6 % — AB (ref 11.5–14.5)
WBC: 11.1 10*3/uL — ABNORMAL HIGH (ref 3.6–11.0)

## 2016-05-30 LAB — COMPREHENSIVE METABOLIC PANEL
ALT: 12 U/L — ABNORMAL LOW (ref 14–54)
ANION GAP: 4 — AB (ref 5–15)
AST: 21 U/L (ref 15–41)
Albumin: 3.2 g/dL — ABNORMAL LOW (ref 3.5–5.0)
Alkaline Phosphatase: 66 U/L (ref 38–126)
BILIRUBIN TOTAL: 0.7 mg/dL (ref 0.3–1.2)
BUN: 16 mg/dL (ref 6–20)
CO2: 26 mmol/L (ref 22–32)
Calcium: 8.5 mg/dL — ABNORMAL LOW (ref 8.9–10.3)
Chloride: 105 mmol/L (ref 101–111)
Creatinine, Ser: 0.63 mg/dL (ref 0.44–1.00)
GFR calc Af Amer: >60 mL/min (ref >60)
Glucose, Bld: 138 mg/dL — ABNORMAL HIGH (ref 65–99)
POTASSIUM: 3.3 mmol/L — AB (ref 3.5–5.1)
Sodium: 135 mmol/L (ref 135–145)
TOTAL PROTEIN: 5.8 g/dL — AB (ref 6.5–8.1)

## 2016-05-30 MED ORDER — HEPARIN SOD (PORK) LOCK FLUSH 100 UNIT/ML IV SOLN
500.0000 [IU] | Freq: Once | INTRAVENOUS | Status: AC | PRN
  Administered 2016-05-30: 500 [IU]

## 2016-05-30 MED ORDER — HEPARIN SOD (PORK) LOCK FLUSH 100 UNIT/ML IV SOLN
INTRAVENOUS | Status: AC
  Filled 2016-05-30: qty 5

## 2016-05-30 MED ORDER — SODIUM CHLORIDE 0.9 % IV SOLN
10.0000 mg | Freq: Once | INTRAVENOUS | Status: AC
  Administered 2016-05-30: 10 mg via INTRAVENOUS
  Filled 2016-05-30: qty 1

## 2016-05-30 MED ORDER — DOCETAXEL CHEMO INJECTION 160 MG/16ML
75.0000 mg/m2 | Freq: Once | INTRAVENOUS | Status: AC
  Administered 2016-05-30: 140 mg via INTRAVENOUS
  Filled 2016-05-30: qty 14

## 2016-05-30 MED ORDER — PEGFILGRASTIM 6 MG/0.6ML ~~LOC~~ PSKT
6.0000 mg | PREFILLED_SYRINGE | Freq: Once | SUBCUTANEOUS | Status: AC
  Administered 2016-05-30: 6 mg via SUBCUTANEOUS
  Filled 2016-05-30: qty 0.6

## 2016-05-30 MED ORDER — SODIUM CHLORIDE 0.9 % IV SOLN
Freq: Once | INTRAVENOUS | Status: AC
  Administered 2016-05-30: 10:00:00 via INTRAVENOUS
  Filled 2016-05-30: qty 1000

## 2016-05-30 NOTE — Progress Notes (Signed)
Complains of soreness in lower extremities and feels weak the past few weeks. Would like for MD to show images of last scan so pt can see how cancer has progressed.

## 2016-06-05 ENCOUNTER — Other Ambulatory Visit: Payer: Self-pay | Admitting: *Deleted

## 2016-06-05 MED ORDER — OXYCODONE HCL 5 MG PO TABS: 5.0000 mg | ORAL_TABLET | Freq: Three times a day (TID) | ORAL | 0 refills | Status: DC | PRN

## 2016-06-19 NOTE — Progress Notes (Signed)
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 She has noted increased bilateral lower extremity edema over the past week making it difficulty to walk, she otherwise feels well. She does not complain of left arm pain, but continues to have tingling that is helped with gabapentin. She does not complain of headache today. She denies any recent fevers. She has a good appetite and denies weight loss. She denies any chest pain or tightness, hemoptysis, or shortness of breath. She denies any nausea, vomiting, diarrhea or constipation.  Patient offers no further specific complaints today.   REVIEW OF SYSTEMS:   Review of Systems  Constitutional: Positive for malaise/fatigue. Negative for diaphoresis, fever and weight loss.  Eyes: Negative.   Respiratory: Negative.   Cardiovascular: Positive for leg swelling. Negative for chest pain and palpitations.  Gastrointestinal: Negative for abdominal pain, blood in stool, diarrhea, melena and nausea.  Genitourinary: Negative.   Musculoskeletal: Positive for back pain.       Left arm "tingling"  Skin: Negative.   Neurological: Positive for weakness.  Psychiatric/Behavioral: Negative.     As per HPI. Otherwise, a complete review of systems is negative.   PAST MEDICAL HISTORY: Past Medical History:  Diagnosis Date  . Anemia 01/21/2015  . 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 Anxiety   . GERD (gastroesophageal reflux disease)   . Hypercholesteremia   . Hypertension   . Last menstrual period (LMP) > 10 days ago 2007  . Lung cancer ([place])   .  Metastatic lung cancer (metastasis from lung to other site) [facility]) 01/21/2015    PAST SURGICAL HISTORY: Past Surgical History:  Procedure Laterality Date  . ABDOMINAL HYSTERECTOMY     partial  . INSERTION / PLACEMENT PLEURAL CATHETER    . PORTACATH PLACEMENT Right 04/26/2015   Procedure: INSERTION PORT-A-CATH;  Surgeon: Nestor Lewandowsky, MD;  Location: [facility] ORS;  Service: General;  Laterality: Right;    FAMILY HISTORY Family History  Problem Relation Age of Onset  . Cervical cancer Mother   . Uterine cancer Maternal Grandmother     GYNECOLOGIC HISTORY:  No LMP recorded. Patient has had a hysterectomy.     ADVANCED DIRECTIVES:    HEALTH MAINTENANCE: Social History  Substance Use Topics  . Smoking status: Former Smoker    Packs/day: 0.25    Years: 8.00    Types: Cigarettes    Quit date: 07/20/2014  . Smokeless tobacco: Never Used  . Alcohol use No    Allergies  Allergen Reactions  . Tegaderm Ag Mesh [Silver] Other (See Comments)    blisters  . Aspirin Other (See Comments)    GIB    Current Outpatient Prescriptions  Medication Sig Dispense Refill  . ALPRAZolam (XANAX) 1 MG tablet TAKE 1/2 BY MOUTH 1 TO 2 TIMES DAILY AS NEEDED FOR ANXIETY AND TAKE 1 BY MOUTH AT NIGHT FOR ANXIETY/SLEEP 45 tablet 0  . gabapentin (NEURONTIN) 300 MG capsule Take 1 capsule (300 mg total) by mouth 3 (three) times daily. 270 capsule 3  . lidocaine-prilocaine (EMLA) cream Apply cream 1 hour before chemotherapy treatment 30 g 1  . lisinopril-hydrochlorothiazide (  PRINZIDE,ZESTORETIC) 10-12.5 MG per tablet Take 1 tablet by mouth daily.    [name] loratadine (CLARITIN) 10 MG tablet Take 10 mg by mouth daily.    . Multiple Vitamins-Minerals (MULTIVITAMIN WITH MINERALS) tablet Take 1 tablet by mouth daily. 30 tablet 3  . omeprazole (PRILOSEC) 20 MG capsule Take 1 capsule (20 mg total) by mouth daily. 30 capsule 0  . oxyCODONE (OXY IR/ROXICODONE) 5 MG immediate release tablet Take 1 tablet (5 mg total) by mouth 3  (three) times daily as needed for severe pain. 90 tablet 0  . potassium chloride SA (K-DUR,KLOR-CON) 20 MEQ tablet Take 1 tablet (20 mEq total) by mouth daily. 90 tablet 1  . simvastatin (ZOCOR) 20 MG tablet Take 20 mg by mouth at bedtime.     . traMADol (ULTRAM) 50 MG tablet Take 1 tablet (50 mg total) by mouth every 6 (six) hours as needed. 90 tablet 1   No current facility-administered medications for this visit.    Facility-Administered Medications Ordered in Other Visits  Medication Dose Route Frequency Provider Last Rate Last Dose  . dexamethasone (DECADRON) 10 mg in sodium chloride 0.9 % 50 mL IVPB  10 mg Intravenous Once Lloyd Huger, MD      . DOCEtaxel (TAXOTERE) 140 mg in dextrose 5 % 250 mL chemo infusion  75 mg/m2 (Order-Specific) Intravenous Once Lloyd Huger, MD      . heparin lock flush 100 unit/mL  250 Units Intracatheter PRN Evlyn Kanner, NP      . heparin lock flush 100 unit/mL  500 Units Intravenous Once Lloyd Huger, MD      . heparin lock flush 100 unit/mL  500 Units Intracatheter Once PRN Lloyd Huger, MD      . pegfilgrastim (NEULASTA ONPRO KIT) injection 6 mg  6 mg Subcutaneous Once Lloyd Huger, MD      . sodium chloride 0.9 % injection 10 mL  10 mL Intracatheter PRN Evlyn Kanner, NP      . sodium chloride 0.9 % injection 10 mL  10 mL Intravenous PRN Leia Alf, MD   10 mL at 04/27/15 0905  . sodium chloride 0.9 % injection 10 mL  10 mL Intravenous Once Lloyd Huger, MD      . sodium chloride 0.9 % injection 3 mL  3 mL Intracatheter PRN Evlyn Kanner, NP        OBJECTIVE: BP 136/82 (BP Location: Right Arm, Patient Position: Sitting)   Pulse (!) 103   Temp 97.9 F (36.6 C) (Tympanic)   Wt 186 lb 13.4 oz (84.8 kg)   BMI 31.09 kg/m    Body mass index is 31.09 kg/m.    ECOG FS:1 - Symptomatic but completely ambulatory  General: Well-developed, well-nourished, no acute distress. Eyes: Pink conjunctiva, anicteric  sclera. Lungs: Clear to auscultation bilaterally. Heart: Regular rate and rhythm. No rubs, murmurs, or gallops. Musculoskeletal: No edemal, No cyanosis, or clubbing.  Neuro: Alert, answering all questions appropriately.  Skin: No rashes or petechiae noted. Psych: Normal affect.    LAB RESULTS:  Infusion on 06/20/2016  Component Date Value Ref Range Status  . WBC 06/20/2016 9.5  3.6 - 11.0 K/uL Final  . RBC 06/20/2016 3.92  3.80 - 5.20 MIL/uL Final  . Hemoglobin 06/20/2016 10.7* 12.0 - 16.0 g/dL Final  . HCT 06/20/2016 32.0* 35.0 - 47.0 % Final  . MCV 06/20/2016 81.6  80.0 - 100.0 fL Final  . MCH 06/20/2016 27.3  26.0 -  34.0 pg Final  . MCHC 06/20/2016 33.4  32.0 - 36.0 g/dL Final  . RDW 06/20/2016 19.8* 11.5 - 14.5 % Final  . Platelets 06/20/2016 266  150 - 440 K/uL Final  . Neutrophils Relative % 06/20/2016 73  % Final  . Neutro Abs 06/20/2016 7.0* 1.4 - 6.5 K/uL Final  . Lymphocytes Relative 06/20/2016 15  % Final  . Lymphs Abs 06/20/2016 1.4  1.0 - 3.6 K/uL Final  . Monocytes Relative 06/20/2016 10  % Final  . Monocytes Absolute 06/20/2016 1.0* 0.2 - 0.9 K/uL Final  . Eosinophils Relative 06/20/2016 1  % Final  . Eosinophils Absolute 06/20/2016 0.0  0 - 0.7 K/uL Final  . Basophils Relative 06/20/2016 1  % Final  . Basophils Absolute 06/20/2016 0.1  0 - 0.1 K/uL Final  . Sodium 06/20/2016 135  135 - 145 mmol/L Final  . Potassium 06/20/2016 3.4* 3.5 - 5.1 mmol/L Final  . Chloride 06/20/2016 104  101 - 111 mmol/L Final  . CO2 06/20/2016 27  22 - 32 mmol/L Final  . Glucose, Bld 06/20/2016 139* 65 - 99 mg/dL Final  . BUN 06/20/2016 18  6 - 20 mg/dL Final  . Creatinine, Ser 06/20/2016 0.59  0.44 - 1.00 mg/dL Final  . Calcium 06/20/2016 8.5* 8.9 - 10.3 mg/dL Final  . Total Protein 06/20/2016 5.6* 6.5 - 8.1 g/dL Final  . Albumin 06/20/2016 3.0* 3.5 - 5.0 g/dL Final  . AST 06/20/2016 20  15 - 41 U/L Final  . ALT 06/20/2016 12* 14 - 54 U/L Final  . Alkaline Phosphatase  06/20/2016 52  38 - 126 U/L Final  . Total Bilirubin 06/20/2016 0.8  0.3 - 1.2 mg/dL Final  . GFR calc non Af Amer 06/20/2016 >60  >60 mL/min Final  . GFR calc Af Amer 06/20/2016 >60  >60 mL/min Final   Comment: (NOTE) The eGFR has been calculated using the CKD EPI equation. This calculation has not been validated in all clinical situations. eGFR's persistently <60 mL/min signify possible Chronic Kidney Disease.   . Anion gap 06/20/2016 4* 5 - 15 Final    STUDIES:  IMPRESSION: 1. New and enlarging bilateral pulmonary nodules. Nodules are currently too numerous to count. An index nodule in the left lower lobe is increased from 1.2 cm diameter to 1.8 cm diameter. 2. Centrilobular emphysema with airway thickening. 3. 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 4. Small right pleural effusion, likely malignant. 5. 1.5 by 1.8 cm hyperdense exophytic left kidney upper pole lesion, complex cyst versus mass. Not visibly hypermetabolic on prior PET-CT, and with similar hyperdensity on portal venous and delayed phase images.  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 On 12/01/14 - Right Pleural Fluid Cytology. POSITIVE FOR MALIGNANT CELLS. ADENOCARCINOMA, CONSISTENT WITH LUNG ORIGIN. Molecular testing for EGFR, ALK and others is negative  PLAN:   1. 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  Patient also progressed on nivolumab. Previously patient was tested and no driver mutation present, therefore will proceed with cycle 4 of single agent Taxotere 75 mg/m every 3 weeks. Will also use OnPro Neulasta for white blood cell count support.  Will reimage after 4 treatments. Return to clinic  in 3 weeks for further evaluation, repeat  PET scan, and consideration of cycle 5.  2. Headaches: MRI the brain completed on March 03, 2016 is essentially unchanged from previous.  3.  Pain (back, hip, arm): MRI and bone scan results reviewed independently revealing metastatic disease in C6 there is likely causing her symptoms. Lumbar MRI revealed an enhancing lesion and L2 as well as a suspicious 12 mm lesion and right sacrum. Patient has completed XRT. Continue gabapentin 300 mg 3 times per day. Continue 10 mg oxycodone as needed.  4.  Kidney lesion: Unclear if metastatic deposit or second primary of the left kidney. Continue to monitor closely.  5.  Anxiety: Resolved. Continue Xanax 1 mg at night and 0.5 mg as needed during the day.     6.  Hypokalemia: Patient has discontinued oral potassium supplementation.     7.  Bony lesions: Consider Zometa in the future.     8.  Peripheral edema: We will get bilateral lower extremity ultrasounds to ensure there is no DVT. If negative, we will reinitiate Lasix 20 mg daily. If positive, patient will return to clinic to discuss anticoagulation.   Lloyd Huger, MD 06/20/16 10:53 AM

## 2016-06-20 ENCOUNTER — Inpatient Hospital Stay: Payer: BLUE CROSS/BLUE SHIELD | Attending: Oncology | Admitting: Oncology

## 2016-06-20 ENCOUNTER — Encounter: Payer: Self-pay | Admitting: Oncology

## 2016-06-20 ENCOUNTER — Inpatient Hospital Stay: Payer: BLUE CROSS/BLUE SHIELD

## 2016-06-20 ENCOUNTER — Ambulatory Visit
Admission: RE | Admit: 2016-06-20 | Discharge: 2016-06-20 | Disposition: A | Discharge status: Home / Self Care | Payer: BLUE CROSS/BLUE SHIELD | Source: Ambulatory Visit | Attending: Oncology | Admitting: Oncology

## 2016-06-20 ENCOUNTER — Other Ambulatory Visit: Payer: Self-pay | Admitting: Oncology

## 2016-06-20 VITALS — BP 136/82 | HR 103 | Temp 97.9°F | Wt 186.8 lb

## 2016-06-20 DIAGNOSIS — R5381 Other malaise: Secondary | ICD-10-CM

## 2016-06-20 DIAGNOSIS — C7931 Secondary malignant neoplasm of brain: Secondary | ICD-10-CM | POA: Insufficient documentation

## 2016-06-20 DIAGNOSIS — R51 Headache: Secondary | ICD-10-CM | POA: Diagnosis not present

## 2016-06-20 DIAGNOSIS — N289 Disorder of kidney and ureter, unspecified: Secondary | ICD-10-CM | POA: Insufficient documentation

## 2016-06-20 DIAGNOSIS — R0609 Other forms of dyspnea: Secondary | ICD-10-CM | POA: Diagnosis not present

## 2016-06-20 DIAGNOSIS — C7951 Secondary malignant neoplasm of bone: Secondary | ICD-10-CM | POA: Insufficient documentation

## 2016-06-20 DIAGNOSIS — C3491 Malignant neoplasm of unspecified part of right bronchus or lung: Secondary | ICD-10-CM | POA: Insufficient documentation

## 2016-06-20 DIAGNOSIS — R600 Localized edema: Secondary | ICD-10-CM

## 2016-06-20 DIAGNOSIS — R531 Weakness: Secondary | ICD-10-CM | POA: Insufficient documentation

## 2016-06-20 DIAGNOSIS — F419 Anxiety disorder, unspecified: Secondary | ICD-10-CM | POA: Insufficient documentation

## 2016-06-20 DIAGNOSIS — I10 Essential (primary) hypertension: Secondary | ICD-10-CM | POA: Diagnosis not present

## 2016-06-20 DIAGNOSIS — J432 Centrilobular emphysema: Secondary | ICD-10-CM

## 2016-06-20 DIAGNOSIS — J910 Malignant pleural effusion: Secondary | ICD-10-CM | POA: Insufficient documentation

## 2016-06-20 DIAGNOSIS — C7802 Secondary malignant neoplasm of left lung: Secondary | ICD-10-CM | POA: Insufficient documentation

## 2016-06-20 DIAGNOSIS — Z7689 Persons encountering health services in other specified circumstances: Secondary | ICD-10-CM | POA: Insufficient documentation

## 2016-06-20 DIAGNOSIS — M549 Dorsalgia, unspecified: Secondary | ICD-10-CM | POA: Insufficient documentation

## 2016-06-20 DIAGNOSIS — Z87891 Personal history of nicotine dependence: Secondary | ICD-10-CM

## 2016-06-20 DIAGNOSIS — R5383 Other fatigue: Secondary | ICD-10-CM | POA: Diagnosis not present

## 2016-06-20 DIAGNOSIS — E876 Hypokalemia: Secondary | ICD-10-CM | POA: Diagnosis not present

## 2016-06-20 DIAGNOSIS — E7800 Pure hypercholesterolemia, unspecified: Secondary | ICD-10-CM | POA: Diagnosis not present

## 2016-06-20 DIAGNOSIS — Z5111 Encounter for antineoplastic chemotherapy: Secondary | ICD-10-CM | POA: Diagnosis not present

## 2016-06-20 DIAGNOSIS — R202 Paresthesia of skin: Secondary | ICD-10-CM | POA: Diagnosis not present

## 2016-06-20 DIAGNOSIS — K219 Gastro-esophageal reflux disease without esophagitis: Secondary | ICD-10-CM | POA: Insufficient documentation

## 2016-06-20 DIAGNOSIS — R262 Difficulty in walking, not elsewhere classified: Secondary | ICD-10-CM | POA: Insufficient documentation

## 2016-06-20 DIAGNOSIS — Z79899 Other long term (current) drug therapy: Secondary | ICD-10-CM

## 2016-06-20 LAB — CBC WITH DIFFERENTIAL/PLATELET
Basophils Absolute: 0.1 10*3/uL (ref 0–0.1)
Basophils Relative: 1 %
EOS ABS: 0 10*3/uL (ref 0–0.7)
EOS PCT: 1 %
HCT: 32 % — ABNORMAL LOW (ref 35.0–47.0)
HEMOGLOBIN: 10.7 g/dL — AB (ref 12.0–16.0)
LYMPHS ABS: 1.4 10*3/uL (ref 1.0–3.6)
LYMPHS PCT: 15 %
MCH: 27.3 pg (ref 26.0–34.0)
MCHC: 33.4 g/dL (ref 32.0–36.0)
MCV: 81.6 fL (ref 80.0–100.0)
MONOS PCT: 10 %
Monocytes Absolute: 1 10*3/uL — ABNORMAL HIGH (ref 0.2–0.9)
Neutro Abs: 7 10*3/uL — ABNORMAL HIGH (ref 1.4–6.5)
Neutrophils Relative %: 73 %
PLATELETS: 266 10*3/uL (ref 150–440)
RBC: 3.92 MIL/uL (ref 3.80–5.20)
RDW: 19.8 % — ABNORMAL HIGH (ref 11.5–14.5)
WBC: 9.5 10*3/uL (ref 3.6–11.0)

## 2016-06-20 LAB — COMPREHENSIVE METABOLIC PANEL
ALK PHOS: 52 U/L (ref 38–126)
ALT: 12 U/L — AB (ref 14–54)
ANION GAP: 4 — AB (ref 5–15)
AST: 20 U/L (ref 15–41)
Albumin: 3 g/dL — ABNORMAL LOW (ref 3.5–5.0)
BUN: 18 mg/dL (ref 6–20)
CO2: 27 mmol/L (ref 22–32)
CREATININE: 0.59 mg/dL (ref 0.44–1.00)
Calcium: 8.5 mg/dL — ABNORMAL LOW (ref 8.9–10.3)
Chloride: 104 mmol/L (ref 101–111)
Glucose, Bld: 139 mg/dL — ABNORMAL HIGH (ref 65–99)
Potassium: 3.4 mmol/L — ABNORMAL LOW (ref 3.5–5.1)
SODIUM: 135 mmol/L (ref 135–145)
TOTAL PROTEIN: 5.6 g/dL — AB (ref 6.5–8.1)
Total Bilirubin: 0.8 mg/dL (ref 0.3–1.2)

## 2016-06-20 MED ORDER — PEGFILGRASTIM 6 MG/0.6ML ~~LOC~~ PSKT
6.0000 mg | PREFILLED_SYRINGE | Freq: Once | SUBCUTANEOUS | Status: AC
  Administered 2016-06-20: 6 mg via SUBCUTANEOUS
  Filled 2016-06-20: qty 0.6

## 2016-06-20 MED ORDER — FUROSEMIDE 20 MG PO TABS: 20.0000 mg | ORAL_TABLET | Freq: Every day | ORAL | 2 refills | Status: DC

## 2016-06-20 MED ORDER — DEXTROSE 5 % IV SOLN
75.0000 mg/m2 | Freq: Once | INTRAVENOUS | Status: AC
  Administered 2016-06-20: 140 mg via INTRAVENOUS
  Filled 2016-06-20: qty 14

## 2016-06-20 MED ORDER — SODIUM CHLORIDE 0.9 % IV SOLN
10.0000 mg | Freq: Once | INTRAVENOUS | Status: AC
  Administered 2016-06-20: 10 mg via INTRAVENOUS
  Filled 2016-06-20: qty 1

## 2016-06-20 MED ORDER — SODIUM CHLORIDE 0.9 % IV SOLN
Freq: Once | INTRAVENOUS | Status: AC
  Administered 2016-06-20: 11:00:00 via INTRAVENOUS
  Filled 2016-06-20: qty 1000

## 2016-06-20 MED ORDER — SODIUM CHLORIDE 0.9 % IJ SOLN
10.0000 mL | Freq: Once | INTRAMUSCULAR | Status: DC
Start: 2016-06-20 — End: 2016-06-20
  Filled 2016-06-20: qty 10

## 2016-06-20 MED ORDER — HEPARIN SOD (PORK) LOCK FLUSH 100 UNIT/ML IV SOLN
500.0000 [IU] | Freq: Once | INTRAVENOUS | Status: AC
  Administered 2016-06-20: 500 [IU] via INTRAVENOUS

## 2016-06-20 MED ORDER — HEPARIN SOD (PORK) LOCK FLUSH 100 UNIT/ML IV SOLN
500.0000 [IU] | Freq: Once | INTRAVENOUS | Status: DC | PRN
  Filled 2016-06-20: qty 5

## 2016-06-21 ENCOUNTER — Other Ambulatory Visit: Payer: Self-pay | Admitting: *Deleted

## 2016-06-21 MED ORDER — FUROSEMIDE 20 MG PO TABS: 20.0000 mg | ORAL_TABLET | Freq: Every day | ORAL | 2 refills | Status: AC

## 2016-06-22 ENCOUNTER — Telehealth: Payer: Self-pay | Admitting: *Deleted

## 2016-06-22 NOTE — Telephone Encounter (Signed)
Pt questioned how long it would take for swelling to decreased. Informed pt that swelling gradually decrease each day and to give it a few more days for patient to have complete relief of swelling. Instructed pt to callback if swelling doesn't improve so we can give her further instructions on how to increase medication. Pt verbalized understanding.

## 2016-06-30 ENCOUNTER — Telehealth: Payer: Self-pay | Admitting: *Deleted

## 2016-06-30 NOTE — Progress Notes (Unsigned)
PSN spoke with patient via phone today.  Patient stated that she did not have financial concerns but did need transportation assistance.  PSN approved [facility] for [facility] appointments related to her cancer diagnosis.

## 2016-06-30 NOTE — Telephone Encounter (Signed)
Patient called in to report fatigue, elevated heart rate at 116. Patient to be scheduled for lab and possible IVF on Monday 10/16. Patient is aware and is in agreement with plan.

## 2016-07-03 ENCOUNTER — Inpatient Hospital Stay: Payer: BLUE CROSS/BLUE SHIELD

## 2016-07-03 ENCOUNTER — Other Ambulatory Visit: Payer: BLUE CROSS/BLUE SHIELD

## 2016-07-03 ENCOUNTER — Ambulatory Visit: Payer: BLUE CROSS/BLUE SHIELD

## 2016-07-03 DIAGNOSIS — C3491 Malignant neoplasm of unspecified part of right bronchus or lung: Secondary | ICD-10-CM

## 2016-07-03 LAB — CBC WITH DIFFERENTIAL/PLATELET
BASOS ABS: 0.1 10*3/uL (ref 0–0.1)
BASOS PCT: 0 %
EOS ABS: 0 10*3/uL (ref 0–0.7)
EOS PCT: 0 %
HCT: 33.5 % — ABNORMAL LOW (ref 35.0–47.0)
HEMOGLOBIN: 11.1 g/dL — AB (ref 12.0–16.0)
Lymphocytes Relative: 7 %
Lymphs Abs: 2 10*3/uL (ref 1.0–3.6)
MCH: 27.5 pg (ref 26.0–34.0)
MCHC: 33.1 g/dL (ref 32.0–36.0)
MCV: 83.1 fL (ref 80.0–100.0)
Monocytes Absolute: 1.2 10*3/uL — ABNORMAL HIGH (ref 0.2–0.9)
Monocytes Relative: 4 %
NEUTROS PCT: 89 %
Neutro Abs: 26.4 10*3/uL — ABNORMAL HIGH (ref 1.4–6.5)
PLATELETS: 229 10*3/uL (ref 150–440)
RBC: 4.03 MIL/uL (ref 3.80–5.20)
RDW: 20.5 % — ABNORMAL HIGH (ref 11.5–14.5)
WBC: 29.7 10*3/uL — AB (ref 3.6–11.0)

## 2016-07-03 LAB — COMPREHENSIVE METABOLIC PANEL
ALBUMIN: 3.1 g/dL — AB (ref 3.5–5.0)
ALK PHOS: 102 U/L (ref 38–126)
ALT: 10 U/L — AB (ref 14–54)
AST: 21 U/L (ref 15–41)
Anion gap: 7 (ref 5–15)
BUN: 12 mg/dL (ref 6–20)
CALCIUM: 8.5 mg/dL — AB (ref 8.9–10.3)
CHLORIDE: 103 mmol/L (ref 101–111)
CO2: 26 mmol/L (ref 22–32)
CREATININE: 0.7 mg/dL (ref 0.44–1.00)
GFR calc non Af Amer: >60 mL/min (ref >60)
GLUCOSE: 124 mg/dL — AB (ref 65–99)
Potassium: 3.4 mmol/L — ABNORMAL LOW (ref 3.5–5.1)
SODIUM: 136 mmol/L (ref 135–145)
Total Bilirubin: 0.5 mg/dL (ref 0.3–1.2)
Total Protein: 6.1 g/dL — ABNORMAL LOW (ref 6.5–8.1)

## 2016-07-03 MED ORDER — HEPARIN SOD (PORK) LOCK FLUSH 100 UNIT/ML IV SOLN
500.0000 [IU] | Freq: Once | INTRAVENOUS | Status: AC
  Administered 2016-07-03: 500 [IU] via INTRAVENOUS
  Filled 2016-07-03: qty 5

## 2016-07-03 MED ORDER — SODIUM CHLORIDE 0.9 % IJ SOLN
10.0000 mL | Freq: Once | INTRAMUSCULAR | Status: AC
  Administered 2016-07-03: 10 mL via INTRAVENOUS
  Filled 2016-07-03: qty 10

## 2016-07-03 MED ORDER — SODIUM CHLORIDE 0.9 % IV SOLN
Freq: Once | INTRAVENOUS | Status: AC
  Administered 2016-07-03: 14:00:00 via INTRAVENOUS
  Filled 2016-07-03: qty 1000

## 2016-07-03 NOTE — Telephone Encounter (Signed)
Call returned to patient, patient to come in for lab/IVF on Monday 10/16.

## 2016-07-06 ENCOUNTER — Telehealth: Payer: Self-pay | Admitting: *Deleted

## 2016-07-06 NOTE — Telephone Encounter (Signed)
Asking for a call back stating that she has no energy at all. She was give out just getting dressed this morning to com in for her scan. Wants a call back this morning as she will be here at the hospital

## 2016-07-06 NOTE — Telephone Encounter (Signed)
Per Dr Grayland Ormond, get scan and let us see what it shows, labs 3 days ago are stable. I then talked to her about cum,mulative effects of chemo and that she just does nmot bounce back as quickly. She stated understanding of this and is in agreement

## 2016-07-07 ENCOUNTER — Ambulatory Visit
Admission: RE | Admit: 2016-07-07 | Discharge: 2016-07-07 | Disposition: A | Discharge status: Home / Self Care | Payer: BLUE CROSS/BLUE SHIELD | Source: Ambulatory Visit | Attending: Oncology | Admitting: Oncology

## 2016-07-07 DIAGNOSIS — J90 Pleural effusion, not elsewhere classified: Secondary | ICD-10-CM | POA: Insufficient documentation

## 2016-07-07 DIAGNOSIS — C3491 Malignant neoplasm of unspecified part of right bronchus or lung: Secondary | ICD-10-CM | POA: Diagnosis present

## 2016-07-07 DIAGNOSIS — M899 Disorder of bone, unspecified: Secondary | ICD-10-CM | POA: Insufficient documentation

## 2016-07-07 LAB — GLUCOSE, CAPILLARY: GLUCOSE-CAPILLARY: 94 mg/dL (ref 65–99)

## 2016-07-07 MED ORDER — FLUDEOXYGLUCOSE F - 18 (FDG) INJECTION
12.8000 | Freq: Once | INTRAVENOUS | Status: AC | PRN
  Administered 2016-07-07: 12.8 via INTRAVENOUS

## 2016-07-10 NOTE — Progress Notes (Signed)
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 She feels exhausted today with increasing weakness and fatigue. She also complains of worsening dyspnea on exertion. She does not complain of left arm pain, but continues to have tingling that is helped with gabapentin. She does not complain of headache today. She denies any recent fevers. She has a fair appetite and denies weight loss. She denies any chest pain or tightness, hemoptysis, or shortness of breath. She denies any nausea, vomiting, diarrhea or constipation.  Patient offers no further specific complaints today.   REVIEW OF SYSTEMS:   Review of Systems  Constitutional: Positive for malaise/fatigue. Negative for diaphoresis, fever and weight loss.  Eyes: Negative.   Respiratory: Negative.   Cardiovascular: Positive for leg swelling. Negative for chest pain and palpitations.  Gastrointestinal: Negative for abdominal pain, blood in stool, diarrhea, melena and nausea.  Genitourinary: Negative.   Musculoskeletal: Positive for back pain.       Left arm "tingling"  Skin: Negative.   Neurological: Positive for weakness.  Psychiatric/Behavioral: Negative.  The patient is not nervous/anxious.     As per HPI. Otherwise, a complete review of systems is negative.   PAST MEDICAL HISTORY: Past Medical History:  Diagnosis Date  . Anemia 01/21/2015  . 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 Anxiety   . GERD (gastroesophageal reflux disease)   . Hypercholesteremia   . Hypertension   . Last menstrual period (LMP) > 10 days ago 2007  .  Lung cancer ([place])   . Metastatic lung cancer (metastasis from lung to other site) [name]) 01/21/2015    PAST SURGICAL HISTORY: Past Surgical History:  Procedure Laterality Date  . ABDOMINAL HYSTERECTOMY     partial  . INSERTION / PLACEMENT PLEURAL CATHETER    . PORTACATH PLACEMENT Right 04/26/2015   Procedure: INSERTION PORT-A-CATH;  Surgeon: Nestor Lewandowsky, MD;  Location: [facility] ORS;  Service: General;  Laterality: Right;    FAMILY HISTORY Family History  Problem Relation Age of Onset  . Cervical cancer Mother   . Uterine cancer Maternal Grandmother     GYNECOLOGIC HISTORY:  No LMP recorded. Patient has had a hysterectomy.     ADVANCED DIRECTIVES:    HEALTH MAINTENANCE: Social History  Substance Use Topics  . Smoking status: Former Smoker    Packs/day: 0.25    Years: 8.00    Types: Cigarettes    Quit date: 07/20/2014  . Smokeless tobacco: Never Used  . Alcohol use No    Allergies  Allergen Reactions  . Tegaderm Ag Mesh [Silver] Other (See Comments)    blisters  . Aspirin Other (See Comments)    GIB    Current Outpatient Prescriptions  Medication Sig Dispense Refill  . ALPRAZolam (XANAX) 1 MG tablet TAKE 1/2 BY MOUTH 1 TO 2 TIMES DAILY AS NEEDED FOR ANXIETY AND TAKE 1 BY MOUTH AT NIGHT FOR ANXIETY/SLEEP 45 tablet 0  . furosemide (LASIX) 20 MG tablet Take 1 tablet (20 mg total) by mouth daily. 30 tablet 2  . gabapentin (NEURONTIN) 300 MG capsule Take 1 capsule (300 mg total) by mouth 3 (three) times daily. Santa [place]  capsule 3  . lidocaine-prilocaine (EMLA) cream Apply cream 1 hour before chemotherapy treatment 30 g 1  . lisinopril-hydrochlorothiazide (PRINZIDE,ZESTORETIC) 10-12.5 MG per tablet Take 1 tablet by mouth daily.    [name] loratadine (CLARITIN) 10 MG tablet Take 10 mg by mouth daily.    . Multiple Vitamins-Minerals (MULTIVITAMIN WITH MINERALS) tablet Take 1 tablet by mouth daily. 30 tablet 3  . omeprazole (PRILOSEC) 20 MG capsule Take 1 capsule (20 mg total) by mouth daily.  30 capsule 0  . oxyCODONE (OXY IR/ROXICODONE) 5 MG immediate release tablet Take 1 tablet (5 mg total) by mouth 3 (three) times daily as needed for severe pain. 90 tablet 0  . potassium chloride SA (K-DUR,KLOR-CON) 20 MEQ tablet Take 1 tablet (20 mEq total) by mouth daily. 90 tablet 1  . simvastatin (ZOCOR) 20 MG tablet Take 20 mg by mouth at bedtime.     . traMADol (ULTRAM) 50 MG tablet Take 1 tablet (50 mg total) by mouth every 6 (six) hours as needed. 90 tablet 1   No current facility-administered medications for this visit.    Facility-Administered Medications Ordered in Other Visits  Medication Dose Route Frequency Provider Last Rate Last Dose  . heparin lock flush 100 unit/mL  250 Units Intracatheter PRN Evlyn Kanner, NP      . sodium chloride 0.9 % injection 10 mL  10 mL Intracatheter PRN Evlyn Kanner, NP      . sodium chloride 0.9 % injection 10 mL  10 mL Intravenous PRN Leia Alf, MD   10 mL at 04/27/15 0905  . sodium chloride 0.9 % injection 3 mL  3 mL Intracatheter PRN Evlyn Kanner, NP        OBJECTIVE: BP 125/83 (BP Location: Right Arm, Patient Position: Sitting)   Pulse (!) 103   Temp (!) 96.9 F (36.1 C) (Tympanic)   Resp 18   Wt 182 lb 5.1 oz (82.7 kg)   BMI 30.34 kg/m    Body mass index is 30.34 kg/m.    ECOG FS:1 - Symptomatic but completely ambulatory  General: Well-developed, well-nourished, no acute distress. Eyes: Pink conjunctiva, anicteric sclera. Lungs: Clear to auscultation bilaterally. Heart: Regular rate and rhythm. No rubs, murmurs, or gallops. Musculoskeletal: No edemal, No cyanosis, or clubbing.  Neuro: Alert, answering all questions appropriately.  Skin: No rashes or petechiae noted. Psych: Normal affect.    LAB RESULTS:  Appointment on 07/11/2016  Component Date Value Ref Range Status  . WBC 07/11/2016 8.5  3.6 - 11.0 K/uL Final  . RBC 07/11/2016 3.87  3.80 - 5.20 MIL/uL Final  . Hemoglobin 07/11/2016 10.7* 12.0 - 16.0 g/dL  Final  . HCT 07/11/2016 32.1* 35.0 - 47.0 % Final  . MCV 07/11/2016 82.8  80.0 - 100.0 fL Final  . MCH 07/11/2016 27.5  26.0 - 34.0 pg Final  . MCHC 07/11/2016 33.2  32.0 - 36.0 g/dL Final  . RDW 07/11/2016 20.0* 11.5 - 14.5 % Final  . Platelets 07/11/2016 332  150 - 440 K/uL Final  . Neutrophils Relative % 07/11/2016 68  % Final  . Neutro Abs 07/11/2016 5.8  1.4 - 6.5 K/uL Final  . Lymphocytes Relative 07/11/2016 17  % Final  . Lymphs Abs 07/11/2016 1.5  1.0 - 3.6 K/uL Final  . Monocytes Relative 07/11/2016 13  % Final  . Monocytes Absolute 07/11/2016 1.1* 0.2 - 0.9 K/uL Final  . Eosinophils Relative 07/11/2016 1  % Final  . Eosinophils Absolute 07/11/2016 0.1  0 - 0.7 K/uL Final  . Basophils Relative 07/11/2016 1  % Final  . Basophils Absolute 07/11/2016 0.1  0 - 0.1 K/uL Final  . Sodium 07/11/2016 134* 135 - 145 mmol/L Final  . Potassium 07/11/2016 3.1* 3.5 - 5.1 mmol/L Final  . Chloride 07/11/2016 101  101 - 111 mmol/L Final  . CO2 07/11/2016 26  22 - 32 mmol/L Final  . Glucose, Bld 07/11/2016 119* 65 - 99 mg/dL Final  . BUN 07/11/2016 18  6 - 20 mg/dL Final  . Creatinine, Ser 07/11/2016 0.57  0.44 - 1.00 mg/dL Final  . Calcium 07/11/2016 8.3* 8.9 - 10.3 mg/dL Final  . Total Protein 07/11/2016 5.6* 6.5 - 8.1 g/dL Final  . Albumin 07/11/2016 3.0* 3.5 - 5.0 g/dL Final  . AST 07/11/2016 20  15 - 41 U/L Final  . ALT 07/11/2016 12* 14 - 54 U/L Final  . Alkaline Phosphatase 07/11/2016 51  38 - 126 U/L Final  . Total Bilirubin 07/11/2016 0.9  0.3 - 1.2 mg/dL Final  . GFR calc non Af Amer 07/11/2016 >60  >60 mL/min Final  . GFR calc Af Amer 07/11/2016 >60  >60 mL/min Final   Comment: (NOTE) The eGFR has been calculated using the CKD EPI equation. This calculation has not been validated in all clinical situations. eGFR's persistently <60 mL/min signify possible Chronic Kidney Disease.   . Anion gap 07/11/2016 7  5 - 15 Final    STUDIES:  PET scan (July 07, 2016) IMPRESSION: 1. Overall interval decrease in the innumerable bilateral pulmonary nodules although lower lung volumes today air associated with increased bibasilar collapse/consolidation. 2. Stable moderate right pleural effusion with interval development of a new moderate left pleural effusion. 3. Interval resolution in hilar hypermetabolism. 4. Interval marked improvement of hypermetabolism in the C6 and L2 vertebral lesions. 5. Diffusely increased FDG uptake in the marrow spaces today presumably related to G-CSF treatment.  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 On 12/01/14 - Right Pleural Fluid Cytology. POSITIVE FOR MALIGNANT CELLS. ADENOCARCINOMA, CONSISTENT WITH LUNG ORIGIN. Molecular testing for EGFR, ALK and others is negative  PLAN:   1. 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  Patient also progressed on nivolumab. Previously patient was tested and no driver mutation present. PET scan results reviewed independently and reported as above with improved disease burden, but likely residual malignancy. Given patient's weakness and fatigue today we will delay treatment and additional 3 weeks. Return to clinic on August 01, 2016 to for further evaluation and consideration of cycle 5 of single agent Taxotere 75 mg/m every 3 weeks. Will also use OnPro Neulasta for white blood cell count support.  Will repeat PET scan in January 2018.  2. Headaches: MRI the brain completed on March 03, 2016 is essentially unchanged from previous.  3.  Pain (back, hip, arm): MRI and bone scan results reviewed independently revealing metastatic disease in C6 there is likely causing her symptoms. Lumbar MRI revealed an enhancing lesion and  L2 as well as a suspicious 12 mm lesion and right sacrum. Patient has completed XRT. Continue gabapentin 300 mg 3 times per day. Continue 10 mg oxycodone as needed.  4.  Kidney lesion: Unclear if metastatic deposit or second primary of the left  kidney. Continue to monitor closely.  5.  Anxiety: Resolved. Continue Xanax 1 mg at night and 0.5 mg as needed during the day.     6.  Hypokalemia: Patient has discontinued oral potassium supplementation.     7.  Bony lesions: Consider Zometa in the future.     8.  Peripheral edema: Continue Lasix as needed. We will use with caution given patient's history of dehydration.     9. Weakness and fatigue: Multifactorial, delay treatment 3 weeks as above.   Lloyd Huger, MD 07/16/16 7:56 AM

## 2016-07-11 ENCOUNTER — Inpatient Hospital Stay: Payer: BLUE CROSS/BLUE SHIELD

## 2016-07-11 ENCOUNTER — Inpatient Hospital Stay (HOSPITAL_BASED_OUTPATIENT_CLINIC_OR_DEPARTMENT_OTHER): Payer: BLUE CROSS/BLUE SHIELD | Admitting: Oncology

## 2016-07-11 VITALS — BP 125/83 | HR 103 | Temp 96.9°F | Resp 18 | Wt 182.3 lb

## 2016-07-11 DIAGNOSIS — R0609 Other forms of dyspnea: Secondary | ICD-10-CM

## 2016-07-11 DIAGNOSIS — R5383 Other fatigue: Secondary | ICD-10-CM

## 2016-07-11 DIAGNOSIS — C7802 Secondary malignant neoplasm of left lung: Secondary | ICD-10-CM | POA: Diagnosis not present

## 2016-07-11 DIAGNOSIS — C3491 Malignant neoplasm of unspecified part of right bronchus or lung: Secondary | ICD-10-CM

## 2016-07-11 DIAGNOSIS — J910 Malignant pleural effusion: Secondary | ICD-10-CM | POA: Diagnosis not present

## 2016-07-11 DIAGNOSIS — K219 Gastro-esophageal reflux disease without esophagitis: Secondary | ICD-10-CM

## 2016-07-11 DIAGNOSIS — R202 Paresthesia of skin: Secondary | ICD-10-CM

## 2016-07-11 DIAGNOSIS — Z87891 Personal history of nicotine dependence: Secondary | ICD-10-CM

## 2016-07-11 DIAGNOSIS — I10 Essential (primary) hypertension: Secondary | ICD-10-CM

## 2016-07-11 DIAGNOSIS — J432 Centrilobular emphysema: Secondary | ICD-10-CM

## 2016-07-11 DIAGNOSIS — C7951 Secondary malignant neoplasm of bone: Secondary | ICD-10-CM

## 2016-07-11 DIAGNOSIS — R5381 Other malaise: Secondary | ICD-10-CM

## 2016-07-11 DIAGNOSIS — E876 Hypokalemia: Secondary | ICD-10-CM

## 2016-07-11 DIAGNOSIS — R600 Localized edema: Secondary | ICD-10-CM

## 2016-07-11 DIAGNOSIS — M549 Dorsalgia, unspecified: Secondary | ICD-10-CM

## 2016-07-11 DIAGNOSIS — Z79899 Other long term (current) drug therapy: Secondary | ICD-10-CM

## 2016-07-11 DIAGNOSIS — C7931 Secondary malignant neoplasm of brain: Secondary | ICD-10-CM | POA: Diagnosis not present

## 2016-07-11 DIAGNOSIS — F419 Anxiety disorder, unspecified: Secondary | ICD-10-CM

## 2016-07-11 DIAGNOSIS — E7800 Pure hypercholesterolemia, unspecified: Secondary | ICD-10-CM

## 2016-07-11 DIAGNOSIS — R262 Difficulty in walking, not elsewhere classified: Secondary | ICD-10-CM

## 2016-07-11 DIAGNOSIS — R51 Headache: Secondary | ICD-10-CM

## 2016-07-11 DIAGNOSIS — N289 Disorder of kidney and ureter, unspecified: Secondary | ICD-10-CM

## 2016-07-11 DIAGNOSIS — R531 Weakness: Secondary | ICD-10-CM

## 2016-07-11 LAB — CBC WITH DIFFERENTIAL/PLATELET
BASOS PCT: 1 %
Basophils Absolute: 0.1 10*3/uL (ref 0–0.1)
EOS PCT: 1 %
Eosinophils Absolute: 0.1 10*3/uL (ref 0–0.7)
HEMATOCRIT: 32.1 % — AB (ref 35.0–47.0)
Hemoglobin: 10.7 g/dL — ABNORMAL LOW (ref 12.0–16.0)
LYMPHS PCT: 17 %
Lymphs Abs: 1.5 10*3/uL (ref 1.0–3.6)
MCH: 27.5 pg (ref 26.0–34.0)
MCHC: 33.2 g/dL (ref 32.0–36.0)
MCV: 82.8 fL (ref 80.0–100.0)
MONO ABS: 1.1 10*3/uL — AB (ref 0.2–0.9)
MONOS PCT: 13 %
NEUTROS ABS: 5.8 10*3/uL (ref 1.4–6.5)
Neutrophils Relative %: 68 %
PLATELETS: 332 10*3/uL (ref 150–440)
RBC: 3.87 MIL/uL (ref 3.80–5.20)
RDW: 20 % — AB (ref 11.5–14.5)
WBC: 8.5 10*3/uL (ref 3.6–11.0)

## 2016-07-11 LAB — COMPREHENSIVE METABOLIC PANEL
ALT: 12 U/L — ABNORMAL LOW (ref 14–54)
ANION GAP: 7 (ref 5–15)
AST: 20 U/L (ref 15–41)
Albumin: 3 g/dL — ABNORMAL LOW (ref 3.5–5.0)
Alkaline Phosphatase: 51 U/L (ref 38–126)
BILIRUBIN TOTAL: 0.9 mg/dL (ref 0.3–1.2)
BUN: 18 mg/dL (ref 6–20)
CO2: 26 mmol/L (ref 22–32)
Calcium: 8.3 mg/dL — ABNORMAL LOW (ref 8.9–10.3)
Chloride: 101 mmol/L (ref 101–111)
Creatinine, Ser: 0.57 mg/dL (ref 0.44–1.00)
GFR calc Af Amer: >60 mL/min (ref >60)
Glucose, Bld: 119 mg/dL — ABNORMAL HIGH (ref 65–99)
POTASSIUM: 3.1 mmol/L — AB (ref 3.5–5.1)
Sodium: 134 mmol/L — ABNORMAL LOW (ref 135–145)
TOTAL PROTEIN: 5.6 g/dL — AB (ref 6.5–8.1)

## 2016-07-11 MED ORDER — HEPARIN SOD (PORK) LOCK FLUSH 100 UNIT/ML IV SOLN
500.0000 [IU] | Freq: Once | INTRAVENOUS | Status: AC
  Administered 2016-07-11: 500 [IU] via INTRAVENOUS
  Filled 2016-07-11: qty 5

## 2016-07-11 NOTE — Progress Notes (Signed)
States feels exhausted today with no energy and get short of breath with exertion.

## 2016-07-12 ENCOUNTER — Encounter: Payer: Self-pay | Admitting: *Deleted

## 2016-07-18 ENCOUNTER — Encounter: Payer: Self-pay | Admitting: *Deleted

## 2016-07-24 ENCOUNTER — Telehealth: Payer: Self-pay | Admitting: *Deleted

## 2016-07-24 ENCOUNTER — Other Ambulatory Visit: Payer: Self-pay | Admitting: *Deleted

## 2016-07-24 ENCOUNTER — Ambulatory Visit
Admission: RE | Admit: 2016-07-24 | Discharge: 2016-07-24 | Disposition: A | Discharge status: Home / Self Care | Payer: BLUE CROSS/BLUE SHIELD | Source: Ambulatory Visit | Attending: Oncology | Admitting: Oncology

## 2016-07-24 DIAGNOSIS — R0602 Shortness of breath: Secondary | ICD-10-CM

## 2016-07-24 DIAGNOSIS — C3491 Malignant neoplasm of unspecified part of right bronchus or lung: Secondary | ICD-10-CM

## 2016-07-24 DIAGNOSIS — J90 Pleural effusion, not elsewhere classified: Secondary | ICD-10-CM | POA: Diagnosis not present

## 2016-07-24 MED ORDER — OXYCODONE HCL 5 MG PO TABS: 5.0000 mg | ORAL_TABLET | Freq: Three times a day (TID) | ORAL | 0 refills | Status: DC | PRN

## 2016-07-24 NOTE — Telephone Encounter (Signed)
Not sure about the jaw, she may need to go to a dentist or urgent care if it is too bothersome.  We can get a CXR for her SOB.

## 2016-07-24 NOTE — Telephone Encounter (Signed)
Patient advised to get CXR and agrees to come in fo rone

## 2016-07-24 NOTE — Telephone Encounter (Signed)
Reports swelling in the right jaw area, no pain. Also reports shortness of breath more pronounced when ambulates. Next appt is 08/01/16

## 2016-07-24 NOTE — Addendum Note (Signed)
Addended by: Betti Cruz on: 07/24/2016 01:47 PM   Modules accepted: Orders

## 2016-07-31 NOTE — Progress Notes (Signed)
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  She continues to have increased shortness of breath and dyspnea exertion. Her weakness and fatigue have improved. She does not complain of left arm pain, but continues to have tingling that is helped with gabapentin. She does not complain of headache today. She denies any recent fevers. She has a fair appetite and denies weight loss. She denies any chest pain or tightness, hemoptysis, or shortness of breath. She denies any nausea, vomiting, diarrhea or constipation.  Patient offers no further specific complaints today.   REVIEW OF SYSTEMS:   Review of Systems  Constitutional: Positive for malaise/fatigue. Negative for diaphoresis, fever and weight loss.  Eyes: Negative.   Respiratory: Positive for shortness of breath. Negative for cough.   Cardiovascular: Positive for leg swelling. Negative for chest pain and palpitations.  Gastrointestinal: Negative for abdominal pain, blood in stool, diarrhea, melena and nausea.  Genitourinary: Negative.   Musculoskeletal: Positive for back pain.       Left arm "tingling"  Skin: Negative.   Neurological: Positive for weakness.  Psychiatric/Behavioral: Negative.  The patient is not nervous/anxious.     As per HPI. Otherwise, a complete review of systems is negative.   PAST MEDICAL HISTORY: Past Medical History:  Diagnosis Date  . Anemia 01/21/2015  . 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 Anxiety   . GERD (gastroesophageal reflux disease)   . Hypercholesteremia   . Hypertension   . Last  menstrual period (LMP) > 10 days ago 2007  . Lung cancer ([place])   . Metastatic lung cancer (metastasis from lung to other site) [facility]) 01/21/2015    PAST SURGICAL HISTORY: Past Surgical History:  Procedure Laterality Date  . ABDOMINAL HYSTERECTOMY     partial  . INSERTION / PLACEMENT PLEURAL CATHETER    . PORTACATH PLACEMENT Right 04/26/2015   Procedure: INSERTION PORT-A-CATH;  Surgeon: Nestor Lewandowsky, MD;  Location: [facility] ORS;  Service: General;  Laterality: Right;    FAMILY HISTORY Family History  Problem Relation Age of Onset  . Cervical cancer Mother   . Uterine cancer Maternal Grandmother     GYNECOLOGIC HISTORY:  No LMP recorded. Patient has had a hysterectomy.     ADVANCED DIRECTIVES:    HEALTH MAINTENANCE: Social History  Substance Use Topics  . Smoking status: Former Smoker    Packs/day: 0.25    Years: 8.00    Types: Cigarettes    Quit date: 07/20/2014  . Smokeless tobacco: Never Used  . Alcohol use No    Allergies  Allergen Reactions  . Tegaderm Ag Mesh [Silver] Other (See Comments)    blisters  . Aspirin Other (See Comments)    GIB    Current Outpatient Prescriptions  Medication Sig Dispense Refill  . ALPRAZolam (XANAX) 1 MG tablet TAKE 1/2 BY MOUTH 1 TO 2 TIMES DAILY AS NEEDED FOR ANXIETY AND TAKE 1 BY MOUTH AT NIGHT FOR ANXIETY/SLEEP 45 tablet 0  . furosemide (LASIX) 20 MG tablet Take 1 tablet (20 mg total) by mouth daily. 30 tablet 2  . gabapentin (NEURONTIN) 300 MG capsule Take 1 capsule (300 mg total)  by mouth 3 (three) times daily. 270 capsule 3  . lidocaine-prilocaine (EMLA) cream Apply cream 1 hour before chemotherapy treatment 30 g 1  . lisinopril-hydrochlorothiazide (PRINZIDE,ZESTORETIC) 10-12.5 MG per tablet Take 1 tablet by mouth daily.    [name] loratadine (CLARITIN) 10 MG tablet Take 10 mg by mouth daily.    . Multiple Vitamins-Minerals (MULTIVITAMIN WITH MINERALS) tablet Take 1 tablet by mouth daily. 30 tablet 3  . omeprazole (PRILOSEC) 20 MG  capsule Take 1 capsule (20 mg total) by mouth daily. 30 capsule 0  . oxyCODONE (OXY IR/ROXICODONE) 5 MG immediate release tablet Take 1 tablet (5 mg total) by mouth 3 (three) times daily as needed for severe pain. 90 tablet 0  . potassium chloride SA (K-DUR,KLOR-CON) 20 MEQ tablet Take 1 tablet (20 mEq total) by mouth daily. 90 tablet 1  . simvastatin (ZOCOR) 20 MG tablet Take 20 mg by mouth at bedtime.     . traMADol (ULTRAM) 50 MG tablet Take 1 tablet (50 mg total) by mouth every 6 (six) hours as needed. 90 tablet 1   No current facility-administered medications for this visit.    Facility-Administered Medications Ordered in Other Visits  Medication Dose Route Frequency Provider Last Rate Last Dose  . heparin lock flush 100 unit/mL  250 Units Intracatheter PRN Evlyn Kanner, NP      . sodium chloride 0.9 % injection 10 mL  10 mL Intracatheter PRN Evlyn Kanner, NP      . sodium chloride 0.9 % injection 10 mL  10 mL Intravenous PRN Leia Alf, MD   10 mL at 04/27/15 0905  . sodium chloride 0.9 % injection 3 mL  3 mL Intracatheter PRN Evlyn Kanner, NP        OBJECTIVE: BP 108/77 (BP Location: Right Arm, Patient Position: Sitting)   Pulse (!) 105   Temp 98.2 F (36.8 C) (Tympanic)   Resp 18   Wt 182 lb 11.2 oz (82.9 kg)   SpO2 98%   BMI 30.40 kg/m    Body mass index is 30.4 kg/m.    ECOG FS:1 - Symptomatic but completely ambulatory  General: Well-developed, well-nourished, no acute distress. Eyes: Pink conjunctiva, anicteric sclera. Lungs: Clear to auscultation bilaterally. Heart: Regular rate and rhythm. No rubs, murmurs, or gallops. Musculoskeletal: No edemal, No cyanosis, or clubbing.  Neuro: Alert, answering all questions appropriately.  Skin: No rashes or petechiae noted. Psych: Normal affect.    LAB RESULTS:  Appointment on 08/01/2016  Component Date Value Ref Range Status  . WBC 08/01/2016 5.3  3.6 - 11.0 K/uL Final  . RBC 08/01/2016 4.04  3.80 - 5.20  MIL/uL Final  . Hemoglobin 08/01/2016 11.2* 12.0 - 16.0 g/dL Final  . HCT 08/01/2016 33.8* 35.0 - 47.0 % Final  . MCV 08/01/2016 83.7  80.0 - 100.0 fL Final  . MCH 08/01/2016 27.7  26.0 - 34.0 pg Final  . MCHC 08/01/2016 33.1  32.0 - 36.0 g/dL Final  . RDW 08/01/2016 16.9* 11.5 - 14.5 % Final  . Platelets 08/01/2016 244  150 - 440 K/uL Final  . Neutrophils Relative % 08/01/2016 59  % Final  . Neutro Abs 08/01/2016 3.2  1.4 - 6.5 K/uL Final  . Lymphocytes Relative 08/01/2016 25  % Final  . Lymphs Abs 08/01/2016 1.3  1.0 - 3.6 K/uL Final  . Monocytes Relative 08/01/2016 8  % Final  . Monocytes Absolute 08/01/2016 0.4  0.2 - 0.9 K/uL Final  . Eosinophils Relative 08/01/2016  7  % Final  . Eosinophils Absolute 08/01/2016 0.4  0 - 0.7 K/uL Final  . Basophils Relative 08/01/2016 1  % Final  . Basophils Absolute 08/01/2016 0.0  0 - 0.1 K/uL Final  . Sodium 08/01/2016 138  135 - 145 mmol/L Final  . Potassium 08/01/2016 3.2* 3.5 - 5.1 mmol/L Final  . Chloride 08/01/2016 104  101 - 111 mmol/L Final  . CO2 08/01/2016 27  22 - 32 mmol/L Final  . Glucose, Bld 08/01/2016 117* 65 - 99 mg/dL Final  . BUN 08/01/2016 18  6 - 20 mg/dL Final  . Creatinine, Ser 08/01/2016 0.60  0.44 - 1.00 mg/dL Final  . Calcium 08/01/2016 8.5* 8.9 - 10.3 mg/dL Final  . Total Protein 08/01/2016 5.9* 6.5 - 8.1 g/dL Final  . Albumin 08/01/2016 3.2* 3.5 - 5.0 g/dL Final  . AST 08/01/2016 19  15 - 41 U/L Final  . ALT 08/01/2016 11* 14 - 54 U/L Final  . Alkaline Phosphatase 08/01/2016 51  38 - 126 U/L Final  . Total Bilirubin 08/01/2016 0.5  0.3 - 1.2 mg/dL Final  . GFR calc non Af Amer 08/01/2016 >60  >60 mL/min Final  . GFR calc Af Amer 08/01/2016 >60  >60 mL/min Final   Comment: (NOTE) The eGFR has been calculated using the CKD EPI equation. This calculation has not been validated in all clinical situations. eGFR's persistently <60 mL/min signify possible Chronic Kidney Disease.   . Anion gap 08/01/2016 7  5 - 15  Final    STUDIES:  PET scan (July 07, 2016) IMPRESSION: 1. Overall interval decrease in the innumerable bilateral pulmonary nodules although lower lung volumes today air associated with increased bibasilar collapse/consolidation. 2. Stable moderate right pleural effusion with interval development of a new moderate left pleural effusion. 3. Interval resolution in hilar hypermetabolism. 4. Interval marked improvement of hypermetabolism in the C6 and L2 vertebral lesions. 5. Diffusely increased FDG uptake in the marrow spaces today presumably related to G-CSF treatment.  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 On 12/01/14 - Right Pleural Fluid Cytology. POSITIVE FOR MALIGNANT CELLS. ADENOCARCINOMA, CONSISTENT WITH LUNG ORIGIN. Molecular testing for EGFR, ALK and others is negative  PLAN:   1. 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  Patient also progressed on nivolumab. Previously patient was tested and no driver mutation was present. PET scan results from July 07, 2016 reviewed independently and reported as above with improved disease burden, but likely residual malignancy. Patient wishes to proceed with cycle 5 of single agent Taxotere 75 mg/m every 3 weeks today. Will also use OnPro Neulasta for white blood cell count support.  Will repeat PET scan in January 2018. Return to clinic in 3 weeks for consideration of cycle 6. 2. Headaches: MRI the brain completed on March 03, 2016 is essentially unchanged from previous.  3.  Pain (back, hip, arm): MRI and bone scan results reviewed independently revealing metastatic disease in C6 there is likely causing her symptoms. Lumbar MRI revealed an enhancing lesion and L2 as  well as a suspicious 12 mm lesion and right sacrum. Patient has completed XRT. Continue gabapentin 300 mg 3 times per day. Continue 10 mg oxycodone as needed.  4.  Kidney lesion: Unclear if metastatic deposit or  second primary of the left kidney. Continue to monitor closely.  5.  Anxiety: Resolved. Continue Xanax 1 mg at night and 0.5 mg as needed during the day.     6.  Hypokalemia: Patient has been instructed to reinitiate oral potassium supplementation.     7.  Bony lesions: Consider Zometa in the future.     8.  Peripheral edema: Continue Lasix PRN. Will use with caution given patient's history of dehydration.     9. Shortness of breath/dyspnea exertion: Will give pulmonary referral for further evaluation.   Lloyd Huger, MD 08/01/16 4:27 PM

## 2016-08-01 ENCOUNTER — Inpatient Hospital Stay: Payer: BLUE CROSS/BLUE SHIELD

## 2016-08-01 ENCOUNTER — Inpatient Hospital Stay: Payer: BLUE CROSS/BLUE SHIELD | Attending: Oncology | Admitting: Oncology

## 2016-08-01 VITALS — BP 108/77 | HR 105 | Temp 98.2°F | Resp 18 | Wt 182.7 lb

## 2016-08-01 DIAGNOSIS — Z7689 Persons encountering health services in other specified circumstances: Secondary | ICD-10-CM | POA: Insufficient documentation

## 2016-08-01 DIAGNOSIS — K219 Gastro-esophageal reflux disease without esophagitis: Secondary | ICD-10-CM

## 2016-08-01 DIAGNOSIS — Z8049 Family history of malignant neoplasm of other genital organs: Secondary | ICD-10-CM | POA: Diagnosis not present

## 2016-08-01 DIAGNOSIS — R0602 Shortness of breath: Secondary | ICD-10-CM

## 2016-08-01 DIAGNOSIS — Z5111 Encounter for antineoplastic chemotherapy: Secondary | ICD-10-CM | POA: Diagnosis not present

## 2016-08-01 DIAGNOSIS — Z923 Personal history of irradiation: Secondary | ICD-10-CM

## 2016-08-01 DIAGNOSIS — E7800 Pure hypercholesterolemia, unspecified: Secondary | ICD-10-CM

## 2016-08-01 DIAGNOSIS — E876 Hypokalemia: Secondary | ICD-10-CM | POA: Insufficient documentation

## 2016-08-01 DIAGNOSIS — R5383 Other fatigue: Secondary | ICD-10-CM | POA: Diagnosis not present

## 2016-08-01 DIAGNOSIS — R531 Weakness: Secondary | ICD-10-CM | POA: Diagnosis not present

## 2016-08-01 DIAGNOSIS — M549 Dorsalgia, unspecified: Secondary | ICD-10-CM | POA: Diagnosis not present

## 2016-08-01 DIAGNOSIS — F419 Anxiety disorder, unspecified: Secondary | ICD-10-CM | POA: Insufficient documentation

## 2016-08-01 DIAGNOSIS — R5381 Other malaise: Secondary | ICD-10-CM

## 2016-08-01 DIAGNOSIS — C3491 Malignant neoplasm of unspecified part of right bronchus or lung: Secondary | ICD-10-CM | POA: Insufficient documentation

## 2016-08-01 DIAGNOSIS — Z79899 Other long term (current) drug therapy: Secondary | ICD-10-CM | POA: Insufficient documentation

## 2016-08-01 DIAGNOSIS — N289 Disorder of kidney and ureter, unspecified: Secondary | ICD-10-CM | POA: Diagnosis not present

## 2016-08-01 DIAGNOSIS — J910 Malignant pleural effusion: Secondary | ICD-10-CM | POA: Diagnosis not present

## 2016-08-01 DIAGNOSIS — Z87891 Personal history of nicotine dependence: Secondary | ICD-10-CM | POA: Insufficient documentation

## 2016-08-01 DIAGNOSIS — C7802 Secondary malignant neoplasm of left lung: Secondary | ICD-10-CM | POA: Insufficient documentation

## 2016-08-01 DIAGNOSIS — I10 Essential (primary) hypertension: Secondary | ICD-10-CM | POA: Diagnosis not present

## 2016-08-01 DIAGNOSIS — R600 Localized edema: Secondary | ICD-10-CM | POA: Diagnosis not present

## 2016-08-01 DIAGNOSIS — R202 Paresthesia of skin: Secondary | ICD-10-CM | POA: Diagnosis not present

## 2016-08-01 LAB — CBC WITH DIFFERENTIAL/PLATELET
Basophils Absolute: 0 10*3/uL (ref 0–0.1)
Basophils Relative: 1 %
EOS ABS: 0.4 10*3/uL (ref 0–0.7)
EOS PCT: 7 %
HCT: 33.8 % — ABNORMAL LOW (ref 35.0–47.0)
Hemoglobin: 11.2 g/dL — ABNORMAL LOW (ref 12.0–16.0)
LYMPHS ABS: 1.3 10*3/uL (ref 1.0–3.6)
LYMPHS PCT: 25 %
MCH: 27.7 pg (ref 26.0–34.0)
MCHC: 33.1 g/dL (ref 32.0–36.0)
MCV: 83.7 fL (ref 80.0–100.0)
MONO ABS: 0.4 10*3/uL (ref 0.2–0.9)
Monocytes Relative: 8 %
Neutro Abs: 3.2 10*3/uL (ref 1.4–6.5)
Neutrophils Relative %: 59 %
PLATELETS: 244 10*3/uL (ref 150–440)
RBC: 4.04 MIL/uL (ref 3.80–5.20)
RDW: 16.9 % — AB (ref 11.5–14.5)
WBC: 5.3 10*3/uL (ref 3.6–11.0)

## 2016-08-01 LAB — COMPREHENSIVE METABOLIC PANEL
ALT: 11 U/L — ABNORMAL LOW (ref 14–54)
ANION GAP: 7 (ref 5–15)
AST: 19 U/L (ref 15–41)
Albumin: 3.2 g/dL — ABNORMAL LOW (ref 3.5–5.0)
Alkaline Phosphatase: 51 U/L (ref 38–126)
BUN: 18 mg/dL (ref 6–20)
CHLORIDE: 104 mmol/L (ref 101–111)
CO2: 27 mmol/L (ref 22–32)
CREATININE: 0.6 mg/dL (ref 0.44–1.00)
Calcium: 8.5 mg/dL — ABNORMAL LOW (ref 8.9–10.3)
Glucose, Bld: 117 mg/dL — ABNORMAL HIGH (ref 65–99)
POTASSIUM: 3.2 mmol/L — AB (ref 3.5–5.1)
SODIUM: 138 mmol/L (ref 135–145)
Total Bilirubin: 0.5 mg/dL (ref 0.3–1.2)
Total Protein: 5.9 g/dL — ABNORMAL LOW (ref 6.5–8.1)

## 2016-08-01 MED ORDER — SODIUM CHLORIDE 0.9 % IV SOLN: 10.0000 mg | Freq: Once | INTRAVENOUS | Status: DC

## 2016-08-01 MED ORDER — SODIUM CHLORIDE 0.9% FLUSH
10.0000 mL | INTRAVENOUS | Status: DC | PRN
  Administered 2016-08-01: 10 mL
  Filled 2016-08-01: qty 10

## 2016-08-01 MED ORDER — SODIUM CHLORIDE 0.9 % IV SOLN
75.0000 mg/m2 | Freq: Once | INTRAVENOUS | Status: AC
  Administered 2016-08-01: 140 mg via INTRAVENOUS
  Filled 2016-08-01: qty 14

## 2016-08-01 MED ORDER — HEPARIN SOD (PORK) LOCK FLUSH 100 UNIT/ML IV SOLN
500.0000 [IU] | Freq: Once | INTRAVENOUS | Status: AC | PRN
  Administered 2016-08-01: 500 [IU]
  Filled 2016-08-01: qty 5

## 2016-08-01 MED ORDER — DEXAMETHASONE SODIUM PHOSPHATE 10 MG/ML IJ SOLN
10.0000 mg | Freq: Once | INTRAMUSCULAR | Status: AC
  Administered 2016-08-01: 10 mg via INTRAVENOUS
  Filled 2016-08-01: qty 1

## 2016-08-01 MED ORDER — SODIUM CHLORIDE 0.9 % IV SOLN
Freq: Once | INTRAVENOUS | Status: AC
  Administered 2016-08-01: 11:00:00 via INTRAVENOUS
  Filled 2016-08-01: qty 1000

## 2016-08-01 MED ORDER — PEGFILGRASTIM 6 MG/0.6ML ~~LOC~~ PSKT
6.0000 mg | PREFILLED_SYRINGE | Freq: Once | SUBCUTANEOUS | Status: AC
  Administered 2016-08-01: 6 mg via SUBCUTANEOUS
  Filled 2016-08-01: qty 0.6

## 2016-08-01 NOTE — Progress Notes (Signed)
States has occasional shortness of breath with wheezing.

## 2016-08-02 ENCOUNTER — Ambulatory Visit (INDEPENDENT_AMBULATORY_CARE_PROVIDER_SITE_OTHER): Payer: BLUE CROSS/BLUE SHIELD | Admitting: Internal Medicine

## 2016-08-02 ENCOUNTER — Other Ambulatory Visit: Payer: Self-pay | Admitting: *Deleted

## 2016-08-02 ENCOUNTER — Ambulatory Visit
Admission: RE | Admit: 2016-08-02 | Discharge: 2016-08-02 | Disposition: A | Discharge status: Home / Self Care | Payer: BLUE CROSS/BLUE SHIELD | Source: Ambulatory Visit | Attending: Internal Medicine | Admitting: Internal Medicine

## 2016-08-02 VITALS — BP 110/88 | HR 112 | Wt 183.0 lb

## 2016-08-02 DIAGNOSIS — C3481 Malignant neoplasm of overlapping sites of right bronchus and lung: Secondary | ICD-10-CM

## 2016-08-02 DIAGNOSIS — J90 Pleural effusion, not elsewhere classified: Secondary | ICD-10-CM | POA: Insufficient documentation

## 2016-08-02 DIAGNOSIS — I700 Atherosclerosis of aorta: Secondary | ICD-10-CM | POA: Diagnosis not present

## 2016-08-02 MED ORDER — MORPHINE SULFATE 10 MG/5ML PO SOLN: ORAL | 0 refills | Status: DC

## 2016-08-02 MED ORDER — UMECLIDINIUM-VILANTEROL 62.5-25 MCG/INH IN AEPB
1.0000 | INHALATION_SPRAY | Freq: Every day | RESPIRATORY_TRACT | 4 refills | Status: DC
Start: 2016-08-02 — End: 2016-09-12

## 2016-08-02 NOTE — Patient Instructions (Signed)
--  Will start morphine sulfate 5 mg every 4 to 6 hours as needed for dyspnea.  --Start a stool softener over the counter once daily.   --Start anoro inhaler once daily.   --Continue physical therapy.

## 2016-08-02 NOTE — Progress Notes (Signed)
[facility] Consultation      Assessment and Plan:  Chronic Loculated right-sided malignant pleural effusion, previous left-sided pleural effusion. -Loculated right-sided pleural effusion appears to go back to April 2016, and has progressed slowly since that time.  --History of right side pleurX catheter placed by Dr. Genevive Kelley in 0962, which was complicated by infection and removed.  -Repeat a chest x-ray 2 view today, shows continued right loculated pleural effusion, which was present approximately 10 days ago with minimal change.  --Right ultrasound performed today:  There was right pleural scarring, and loculated pleural inflammation, there was no area of free flowing fluid, there is no area that is amenable for drainage or catheter placement.  --Discussed findings with patient, the area around the right lung is scarred, or shifting its movement. This is contributing to her dyspnea. Unfortunately, there is no easy treatment for this, this would likely require a surgical procedure for which she would not be amenable. Therefore, we discussed that we will try an empiric inhaler to try to maximize her Crestor status, and we will treat her symptoms of dyspnea with morphine 5 mg as needed during the day.   Metastatic lung cancer. -Numerous bilateral lung nodules, with skeletal metastasis. -Continue to follow up with oncology.  Dyspnea with acute respiratory failure. -Likely multifactorial secondary to above, suspect predominantly due to pleural effusion.    Date: 08/02/2016  MRN# [id number] Patricia Kelley 01/05/1956  Referring Physician: Dr. Grayland Kelley.  Patricia Kelley is a 60 y.o. old female seen in consultation for chief complaint of:    Chief Complaint  Patient presents with  . Advice Only    HPI:   The patient is a 60 year old female with a history of right non-small cell lung cancer, stage IV. She has a malignant pleural effusion as well as bilateral lung  metastases. She had continued progression on chemotherapy including metastatic disease to C6, L2, right sacrum. Review the patient's most recent imaging including chest x-ray 11/6 and CT chest 10/20 shows numerous bilateral small pulmonary nodules, and a loculated right pleural effusion, minimal left pleural effusion, though on previous CT chest there did appear to be a free-flowing left-sided pleural effusion. Loculated right pleural effusion as seen on previous CT chest imaging going back to April 2016.  She has a history of a right pleurX catheter in 2016 placed by Dr. Genevive Kelley That became infected with methicillin-resistant staph aureus. She's underwent a prolonged treatment with intravenous vancomycin for that. Her head was removed several weeks ago.   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 On 12/01/14 - Right Pleural Fluid Cytology. POSITIVE FOR MALIGNANT CELLS. ADENOCARCINOMA, CONSISTENT WITH LUNG ORIGIN. Molecular testing for EGFR, ALK and others is negative  PET scan (July 07, 2016) IMPRESSION: 1. Overall interval decrease in the innumerable bilateral pulmonary nodules although lower lung volumes today air associated with increased bibasilar collapse/consolidation. 2. Stable moderate right pleural effusion with interval development of a new moderate left pleural effusion. 3. Interval resolution in hilar hypermetabolism. 4. Interval marked improvement of hypermetabolism in the C6 and L2 vertebral lesions. 5. Diffusely increased FDG uptake in the marrow spaces today presumably related to G-CSF treatment.  The patient was ambulated in the office today, beginning O2 sat was 99%, heart rate was 94. Patient was ambulated 150 feet. She had severe dyspnea and had to stop, her oxygen saturation remained  94% to 96%, however, her heart rate increased to  120.   PMHX:   Past Medical History:  Diagnosis Date  . Anemia 01/21/2015  . 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 Anxiety   . GERD (gastroesophageal reflux disease)   . Hypercholesteremia   . Hypertension   . Last menstrual period (LMP) > 10 days ago 2007  . Lung cancer ([facility])   . Metastatic lung cancer (metastasis from lung to other site) [facility]) 01/21/2015   Surgical Hx:  Past Surgical History:  Procedure Laterality Date  . ABDOMINAL HYSTERECTOMY     partial  . INSERTION / PLACEMENT PLEURAL CATHETER    . PORTACATH PLACEMENT Right 04/26/2015   Procedure: INSERTION PORT-A-CATH;  Surgeon: Nestor Lewandowsky, MD;  Location: [facility] ORS;  Service: General;  Laterality: Right;   Family Hx:  Family History  Problem Relation Age of Onset  . Cervical cancer Mother   . Uterine cancer Maternal Grandmother    Social Hx:   Social History  Substance Use Topics  . Smoking status: Former Smoker    Packs/day: 0.25    Years: 8.00    Types: Cigarettes    Quit date: 07/20/2014  . Smokeless tobacco: Never Used  . Alcohol use No   Medication:   reviewed    Allergies:  Tegaderm ag mesh [silver] and Aspirin  Review of Systems: Gen:  Denies  fever, sweats, chills HEENT: Denies blurred vision, double vision. bleeds, sore throat Cvc:  No dizziness, chest pain. Resp:   Denies cough or sputum production, shortness of breath Gi: Denies swallowing difficulty, stomach pain. Gu:  Denies bladder incontinence, burning urine Ext:   No Joint pain, stiffness. Skin: No skin rash,  hives  Endoc:  No polyuria, polydipsia. Psych: No depression, insomnia. Other:  All other systems were reviewed with the patient and were negative other that what is mentioned in the HPI.   Physical Examination:   VS: BP 110/88 (BP Location: Left Arm, Cuff Size: Normal)   Pulse (!) 112   Wt 183 lb (83 kg)   SpO2 97%   BMI 30.45 kg/m   General Appearance: No distress  Neuro:without focal findings,  speech normal,  HEENT:  PERRLA, EOM intact.   Pulmonary: normal breath sounds, decreased air entry in the right lung, with dullness to percussion on the right.  CardiovascularNormal S1,S2.  No m/r/g.   Abdomen: Benign, Soft, non-tender. Renal:  No costovertebral tenderness  GU:  No performed at this time. Endoc: No evident thyromegaly, no signs of acromegaly. Skin:   warm, no rashes, no ecchymosis  Extremities: normal, no cyanosis, clubbing.  Other findings:    LABORATORY PANEL:   CBC  Recent Labs Lab 08/01/16 0855  WBC 5.3  HGB 11.2*  HCT 33.8*  PLT 244   ------------------------------------------------------------------------------------------------------------------  Chemistries   Recent Labs Lab 08/01/16 0855  NA 138  K 3.2*  CL 104  CO2 27  GLUCOSE 117*  BUN 18  CREATININE 0.60  CALCIUM 8.5*  AST 19  ALT 11*  ALKPHOS 51  BILITOT 0.5   ------------------------------------------------------------------------------------------------------------------  Cardiac Enzymes No results for input(s): TROPONINI in the last 168 hours. ------------------------------------------------------------  RADIOLOGY:  No results found.     Thank  you for the consultation and for allowing [facility] to assist in the care of your patient. Our recommendations are noted above.  Please contact us if we can be of further service.   Marda Stalker, MD.  Board Certified in Internal  Medicine, Pulmonary Medicine, Critical Care Medicine, and Sleep Medicine.  [facility] Office Number: [phone]  Patricia Pesa, M.D.  Vilinda Boehringer, M.D.  Merton Border, M.D  08/02/2016

## 2016-08-02 NOTE — Progress Notes (Signed)
r 

## 2016-08-03 ENCOUNTER — Telehealth: Payer: Self-pay | Admitting: Internal Medicine

## 2016-08-03 NOTE — Telephone Encounter (Signed)
Pt calling asking when is it a good time for her to take her medication  Please call back

## 2016-08-03 NOTE — Telephone Encounter (Signed)
Spoke with pt and gave her direction on when to take her inhaler. Pt verbalized understanding. Nothing further needed.

## 2016-08-04 ENCOUNTER — Telehealth: Payer: Self-pay | Admitting: Internal Medicine

## 2016-08-04 NOTE — Telephone Encounter (Signed)
Pt calling stating we called in some medication which is a liquid and she states she's never used one like that would like a call back

## 2016-08-04 NOTE — Telephone Encounter (Signed)
Pt was asking about the Morphine liquid. Says she has never taken a liquid before. Pt informed of directions. States she will call back with any further questions.

## 2016-08-07 ENCOUNTER — Telehealth: Payer: Self-pay | Admitting: Internal Medicine

## 2016-08-07 NOTE — Telephone Encounter (Signed)
Pt would like a call regarding her morphine sulphate and Anoro. Please call.

## 2016-08-07 NOTE — Telephone Encounter (Signed)
Pt states she can't take the morphine you gave her. States it caused her chest to hurt and it didn't make her feel good. Please advise.

## 2016-08-09 ENCOUNTER — Other Ambulatory Visit: Payer: Self-pay | Admitting: *Deleted

## 2016-08-09 MED ORDER — ALPRAZOLAM 1 MG PO TABS: ORAL_TABLET | ORAL | 0 refills | Status: DC

## 2016-08-09 MED ORDER — PROMETHAZINE HCL 25 MG PO TABS: 25.0000 mg | ORAL_TABLET | Freq: Four times a day (QID) | ORAL | 0 refills | Status: AC | PRN

## 2016-08-09 NOTE — Telephone Encounter (Signed)
We were trying that just to see if it helped her symptoms because we did not have anything else to try. If it made her feel worse then she should not continue it.

## 2016-08-09 NOTE — Telephone Encounter (Signed)
LMOM for pt letting her know not to continue the Morphine. Call back with any further questions.

## 2016-08-21 NOTE — Progress Notes (Signed)
Patricia Kelley  [phone]  Fax:([phone]) [place] DOB: 07/02/1956  MR#: [id number]  JOI#:[id number]  Patient Care Team: Marguerita Merles, MD as PCP - General (Family Medicine)  CHIEF COMPLAINT: Stage IV right lung non-small cell carcinoma with malignant pleural effusion and bilateral lung metastasis  INTERVAL HISTORY: Patient returns to clinic today for further evaluation and consideration of cycle 6 of Taxotere. She currently feels well. Her weakness and fatigue have improved. She does not complain of left arm pain, but continues to have tingling that is helped with gabapentin. She does not complain of headache today. She denies any recent fevers. She has a fair appetite and denies weight loss. She denies any chest pain or tightness, hemoptysis, or shortness of breath. She denies any nausea, vomiting, diarrhea or constipation.  Patient offers no further specific complaints today.   REVIEW OF SYSTEMS:   Review of Systems  Constitutional: Positive for malaise/fatigue. Negative for diaphoresis, fever and weight loss.  Eyes: Negative.   Respiratory: Positive for shortness of breath. Negative for cough.   Cardiovascular: Positive for leg swelling. Negative for chest pain and palpitations.  Gastrointestinal: Negative for abdominal pain, blood in stool, diarrhea, melena and nausea.  Genitourinary: Negative.   Musculoskeletal: Positive for back pain.       Left arm "tingling"  Skin: Negative.   Neurological: Positive for weakness.  Psychiatric/Behavioral: Negative.  The patient is not nervous/anxious.     As per HPI. Otherwise, a complete review of systems is negative.   PAST MEDICAL HISTORY: Past Medical History:  Diagnosis Date  . Anemia 01/21/2015  . 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 Anxiety   . GERD (gastroesophageal reflux disease)   . Hypercholesteremia   . Hypertension   . Last menstrual period (LMP) > 10 days ago 2007  . Lung  cancer ([place])   . Metastatic lung cancer (metastasis from lung to other site) [facility]) 01/21/2015    PAST SURGICAL HISTORY: Past Surgical History:  Procedure Laterality Date  . ABDOMINAL HYSTERECTOMY     partial  . INSERTION / PLACEMENT PLEURAL CATHETER    . PORTACATH PLACEMENT Right 04/26/2015   Procedure: INSERTION PORT-A-CATH;  Surgeon: Nestor Lewandowsky, MD;  Location: [facility] ORS;  Service: General;  Laterality: Right;    FAMILY HISTORY Family History  Problem Relation Age of Onset  . Cervical cancer Mother   . Uterine cancer Maternal Grandmother     GYNECOLOGIC HISTORY:  No LMP recorded. Patient has had a hysterectomy.     ADVANCED DIRECTIVES:    HEALTH MAINTENANCE: Social History  Substance Use Topics  . Smoking status: Former Smoker    Packs/day: 0.25    Years: 8.00    Types: Cigarettes    Quit date: 07/20/2014  . Smokeless tobacco: Never Used  . Alcohol use No    Allergies  Allergen Reactions  . Tegaderm Ag Mesh [Silver] Other (See Comments)    blisters  . Aspirin Other (See Comments)    GIB    Current Outpatient Prescriptions  Medication Sig Dispense Refill  . ALPRAZolam (XANAX) 1 MG tablet TAKE 1/2 BY MOUTH 1 TO 2 TIMES DAILY AS NEEDED FOR ANXIETY AND TAKE 1 BY MOUTH AT NIGHT FOR ANXIETY/SLEEP 45 tablet 0  . furosemide (LASIX) 20 MG tablet Take 1 tablet (20 mg total) by mouth daily. 30 tablet 2  . gabapentin (NEURONTIN) 300 MG capsule Take 1 capsule (300 mg total) by mouth 3 (three) times daily.  270 capsule 3  . lidocaine-prilocaine (EMLA) cream Apply cream 1 hour before chemotherapy treatment 30 g 1  . lisinopril-hydrochlorothiazide (PRINZIDE,ZESTORETIC) 10-12.5 MG per tablet Take 1 tablet by mouth daily.    [name] loratadine (CLARITIN) 10 MG tablet Take 10 mg by mouth daily.    [name] morphine 10 MG/5ML solution Take 2.5 ml every 4-6 hours as needed for dyspnea. 100 mL 0  . Multiple Vitamins-Minerals (MULTIVITAMIN WITH MINERALS) tablet Take 1 tablet by mouth daily. 30 tablet  3  . omeprazole (PRILOSEC) 20 MG capsule Take 1 capsule (20 mg total) by mouth daily. 30 capsule 0  . oxyCODONE (OXY IR/ROXICODONE) 5 MG immediate release tablet Take 1 tablet (5 mg total) by mouth 3 (three) times daily as needed for severe pain. 90 tablet 0  . potassium chloride SA (K-DUR,KLOR-CON) 20 MEQ tablet Take 1 tablet (20 mEq total) by mouth daily. 90 tablet 1  . promethazine (PHENERGAN) 25 MG tablet Take 1 tablet (25 mg total) by mouth every 6 (six) hours as needed for nausea or vomiting. 30 tablet 0  . simvastatin (ZOCOR) 20 MG tablet Take 20 mg by mouth at bedtime.     [name] umeclidinium-vilanterol (ANORO ELLIPTA) 62.5-25 MCG/INH AEPB Inhale 1 puff into the lungs daily. 60 each 4  . vitamin B-12 (CYANOCOBALAMIN) 1000 MCG tablet Take 1,000 mcg by mouth daily.     No current facility-administered medications for this visit.    Facility-Administered Medications Ordered in Other Visits  Medication Dose Route Frequency Provider Last Rate Last Dose  . heparin lock flush 100 unit/mL  250 Units Intracatheter PRN Evlyn Kanner, NP      . sodium chloride 0.9 % injection 10 mL  10 mL Intracatheter PRN Evlyn Kanner, NP      . sodium chloride 0.9 % injection 10 mL  10 mL Intravenous PRN Leia Alf, MD   10 mL at 04/27/15 0905  . sodium chloride 0.9 % injection 3 mL  3 mL Intracatheter PRN Evlyn Kanner, NP        OBJECTIVE: BP 94/69 (BP Location: Left Arm, Patient Position: Sitting)   Pulse (!) 105   Temp 97.2 F (36.2 C) (Tympanic)   Resp 18   Wt 174 lb 6.1 oz (79.1 kg)   SpO2 99%   BMI 29.02 kg/m    Body mass index is 29.02 kg/m.    ECOG FS:1 - Symptomatic but completely ambulatory  General: Well-developed, well-nourished, no acute distress. Eyes: Pink conjunctiva, anicteric sclera. Lungs: Clear to auscultation bilaterally. Heart: Regular rate and rhythm. No rubs, murmurs, or gallops. Musculoskeletal: No edemal, No cyanosis, or clubbing.  Neuro: Alert, answering all  questions appropriately.  Skin: No rashes or petechiae noted. Psych: Normal affect.    LAB RESULTS:  Appointment on 08/22/2016  Component Date Value Ref Range Status  . WBC 08/22/2016 8.3  3.6 - 11.0 K/uL Final  . RBC 08/22/2016 4.24  3.80 - 5.20 MIL/uL Final  . Hemoglobin 08/22/2016 11.6* 12.0 - 16.0 g/dL Final  . HCT 08/22/2016 35.5  35.0 - 47.0 % Final  . MCV 08/22/2016 83.7  80.0 - 100.0 fL Final  . MCH 08/22/2016 27.3  26.0 - 34.0 pg Final  . MCHC 08/22/2016 32.6  32.0 - 36.0 g/dL Final  . RDW 08/22/2016 15.7* 11.5 - 14.5 % Final  . Platelets 08/22/2016 342  150 - 440 K/uL Final  . Neutrophils Relative % 08/22/2016 72  % Final  . Neutro Abs 08/22/2016 6.0  1.4 - 6.5 K/uL Final  . Lymphocytes Relative 08/22/2016 17  % Final  . Lymphs Abs 08/22/2016 1.4  1.0 - 3.6 K/uL Final  . Monocytes Relative 08/22/2016 9  % Final  . Monocytes Absolute 08/22/2016 0.7  0.2 - 0.9 K/uL Final  . Eosinophils Relative 08/22/2016 1  % Final  . Eosinophils Absolute 08/22/2016 0.1  0 - 0.7 K/uL Final  . Basophils Relative 08/22/2016 1  % Final  . Basophils Absolute 08/22/2016 0.1  0 - 0.1 K/uL Final  . Sodium 08/22/2016 137  135 - 145 mmol/L Final  . Potassium 08/22/2016 3.4* 3.5 - 5.1 mmol/L Final  . Chloride 08/22/2016 104  101 - 111 mmol/L Final  . CO2 08/22/2016 27  22 - 32 mmol/L Final  . Glucose, Bld 08/22/2016 121* 65 - 99 mg/dL Final  . BUN 08/22/2016 18  6 - 20 mg/dL Final  . Creatinine, Ser 08/22/2016 0.45  0.44 - 1.00 mg/dL Final  . Calcium 08/22/2016 8.7* 8.9 - 10.3 mg/dL Final  . Total Protein 08/22/2016 6.2* 6.5 - 8.1 g/dL Final  . Albumin 08/22/2016 3.4* 3.5 - 5.0 g/dL Final  . AST 08/22/2016 20  15 - 41 U/L Final  . ALT 08/22/2016 14  14 - 54 U/L Final  . Alkaline Phosphatase 08/22/2016 46  38 - 126 U/L Final  . Total Bilirubin 08/22/2016 0.7  0.3 - 1.2 mg/dL Final  . GFR calc non Af Amer 08/22/2016 >60  >60 mL/min Final  . GFR calc Af Amer 08/22/2016 >60  >60 mL/min Final    Comment: (NOTE) The eGFR has been calculated using the CKD EPI equation. This calculation has not been validated in all clinical situations. eGFR's persistently <60 mL/min signify possible Chronic Kidney Disease.   . Anion gap 08/22/2016 6  5 - 15 Final    STUDIES:  PET scan (July 07, 2016) IMPRESSION: 1. Overall interval decrease in the innumerable bilateral pulmonary nodules although lower lung volumes today air associated with increased bibasilar collapse/consolidation. 2. Stable moderate right pleural effusion with interval development of a new moderate left pleural effusion. 3. Interval resolution in hilar hypermetabolism. 4. Interval marked improvement of hypermetabolism in the C6 and L2 vertebral lesions. 5. Diffusely increased FDG uptake in the marrow spaces today presumably related to G-CSF treatment.  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 On 12/01/14 - Right Pleural Fluid Cytology. POSITIVE FOR MALIGNANT CELLS. ADENOCARCINOMA, CONSISTENT WITH LUNG ORIGIN. Molecular testing for EGFR, ALK and others is negative  PLAN:   1. 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  Patient also progressed on nivolumab. Previously patient was tested and no driver mutation was present. PET scan results from July 07, 2016 reviewed independently and reported as above with improved disease burden, but likely residual malignancy. Proceed with cycle 6 of single agent Taxotere 75 mg/m every 3 weeks today. Will also use OnPro Neulasta for white blood cell count support.  Will repeat PET scan in ~January 2018. Return to clinic in 3 weeks for consideration of cycle 7. 2. Headaches: MRI the brain completed on March 03, 2016 is  essentially unchanged from previous.  3.  Pain (back, hip, arm): MRI and bone scan results reviewed independently revealing metastatic disease in C6 there is likely  causing her symptoms. Lumbar MRI revealed an enhancing lesion and L2 as well as a suspicious 12 mm lesion and right sacrum. Patient has completed XRT. Continue gabapentin 300 mg 3 times per day. Continue 10 mg oxycodone as needed.  4.  Kidney lesion: Unclear if metastatic deposit or second primary of the left kidney. Continue to monitor closely.  5.  Anxiety: Resolved. Continue Xanax 1 mg at night and 0.5 mg as needed during the day.     6.  Hypokalemia: Patient has been instructed to reinitiate oral potassium supplementation.     7.  Bony lesions: Consider Zometa in the future.     8.  Peripheral edema: Continue Lasix PRN. Will use with caution given patient's history of dehydration.     9. Shortness of breath/dyspnea exertion: Patient was evaluated by pulmonary. Improved.    Lloyd Huger, MD 08/24/16 10:42 AM

## 2016-08-22 ENCOUNTER — Inpatient Hospital Stay: Payer: BLUE CROSS/BLUE SHIELD | Attending: Oncology | Admitting: Oncology

## 2016-08-22 ENCOUNTER — Inpatient Hospital Stay: Payer: BLUE CROSS/BLUE SHIELD

## 2016-08-22 VITALS — BP 94/69 | HR 105 | Temp 97.2°F | Resp 18 | Wt 174.4 lb

## 2016-08-22 DIAGNOSIS — Z79899 Other long term (current) drug therapy: Secondary | ICD-10-CM | POA: Diagnosis not present

## 2016-08-22 DIAGNOSIS — N289 Disorder of kidney and ureter, unspecified: Secondary | ICD-10-CM

## 2016-08-22 DIAGNOSIS — R600 Localized edema: Secondary | ICD-10-CM | POA: Diagnosis not present

## 2016-08-22 DIAGNOSIS — Z923 Personal history of irradiation: Secondary | ICD-10-CM | POA: Diagnosis not present

## 2016-08-22 DIAGNOSIS — C7802 Secondary malignant neoplasm of left lung: Secondary | ICD-10-CM | POA: Diagnosis not present

## 2016-08-22 DIAGNOSIS — C3491 Malignant neoplasm of unspecified part of right bronchus or lung: Secondary | ICD-10-CM | POA: Insufficient documentation

## 2016-08-22 DIAGNOSIS — R202 Paresthesia of skin: Secondary | ICD-10-CM | POA: Diagnosis not present

## 2016-08-22 DIAGNOSIS — Z5111 Encounter for antineoplastic chemotherapy: Secondary | ICD-10-CM | POA: Insufficient documentation

## 2016-08-22 DIAGNOSIS — E7800 Pure hypercholesterolemia, unspecified: Secondary | ICD-10-CM | POA: Diagnosis not present

## 2016-08-22 DIAGNOSIS — E876 Hypokalemia: Secondary | ICD-10-CM | POA: Insufficient documentation

## 2016-08-22 DIAGNOSIS — Z7689 Persons encountering health services in other specified circumstances: Secondary | ICD-10-CM | POA: Diagnosis not present

## 2016-08-22 DIAGNOSIS — Z87891 Personal history of nicotine dependence: Secondary | ICD-10-CM | POA: Insufficient documentation

## 2016-08-22 DIAGNOSIS — M545 Low back pain: Secondary | ICD-10-CM | POA: Diagnosis not present

## 2016-08-22 DIAGNOSIS — I10 Essential (primary) hypertension: Secondary | ICD-10-CM | POA: Diagnosis not present

## 2016-08-22 DIAGNOSIS — Z90710 Acquired absence of both cervix and uterus: Secondary | ICD-10-CM | POA: Diagnosis not present

## 2016-08-22 DIAGNOSIS — R5381 Other malaise: Secondary | ICD-10-CM | POA: Diagnosis not present

## 2016-08-22 DIAGNOSIS — R531 Weakness: Secondary | ICD-10-CM | POA: Insufficient documentation

## 2016-08-22 DIAGNOSIS — K219 Gastro-esophageal reflux disease without esophagitis: Secondary | ICD-10-CM | POA: Insufficient documentation

## 2016-08-22 DIAGNOSIS — R05 Cough: Secondary | ICD-10-CM | POA: Diagnosis not present

## 2016-08-22 DIAGNOSIS — C7951 Secondary malignant neoplasm of bone: Secondary | ICD-10-CM | POA: Insufficient documentation

## 2016-08-22 DIAGNOSIS — J910 Malignant pleural effusion: Secondary | ICD-10-CM | POA: Diagnosis not present

## 2016-08-22 DIAGNOSIS — R0602 Shortness of breath: Secondary | ICD-10-CM | POA: Diagnosis not present

## 2016-08-22 DIAGNOSIS — R630 Anorexia: Secondary | ICD-10-CM | POA: Diagnosis not present

## 2016-08-22 DIAGNOSIS — Z8049 Family history of malignant neoplasm of other genital organs: Secondary | ICD-10-CM | POA: Insufficient documentation

## 2016-08-22 DIAGNOSIS — R5383 Other fatigue: Secondary | ICD-10-CM | POA: Insufficient documentation

## 2016-08-22 LAB — CBC WITH DIFFERENTIAL/PLATELET
Basophils Absolute: 0.1 10*3/uL (ref 0–0.1)
Basophils Relative: 1 %
EOS ABS: 0.1 10*3/uL (ref 0–0.7)
EOS PCT: 1 %
HCT: 35.5 % (ref 35.0–47.0)
Hemoglobin: 11.6 g/dL — ABNORMAL LOW (ref 12.0–16.0)
LYMPHS ABS: 1.4 10*3/uL (ref 1.0–3.6)
LYMPHS PCT: 17 %
MCH: 27.3 pg (ref 26.0–34.0)
MCHC: 32.6 g/dL (ref 32.0–36.0)
MCV: 83.7 fL (ref 80.0–100.0)
MONO ABS: 0.7 10*3/uL (ref 0.2–0.9)
Monocytes Relative: 9 %
Neutro Abs: 6 10*3/uL (ref 1.4–6.5)
Neutrophils Relative %: 72 %
PLATELETS: 342 10*3/uL (ref 150–440)
RBC: 4.24 MIL/uL (ref 3.80–5.20)
RDW: 15.7 % — AB (ref 11.5–14.5)
WBC: 8.3 10*3/uL (ref 3.6–11.0)

## 2016-08-22 LAB — COMPREHENSIVE METABOLIC PANEL
ALT: 14 U/L (ref 14–54)
ANION GAP: 6 (ref 5–15)
AST: 20 U/L (ref 15–41)
Albumin: 3.4 g/dL — ABNORMAL LOW (ref 3.5–5.0)
Alkaline Phosphatase: 46 U/L (ref 38–126)
BUN: 18 mg/dL (ref 6–20)
CHLORIDE: 104 mmol/L (ref 101–111)
CO2: 27 mmol/L (ref 22–32)
CREATININE: 0.45 mg/dL (ref 0.44–1.00)
Calcium: 8.7 mg/dL — ABNORMAL LOW (ref 8.9–10.3)
Glucose, Bld: 121 mg/dL — ABNORMAL HIGH (ref 65–99)
POTASSIUM: 3.4 mmol/L — AB (ref 3.5–5.1)
SODIUM: 137 mmol/L (ref 135–145)
Total Bilirubin: 0.7 mg/dL (ref 0.3–1.2)
Total Protein: 6.2 g/dL — ABNORMAL LOW (ref 6.5–8.1)

## 2016-08-22 MED ORDER — PEGFILGRASTIM 6 MG/0.6ML ~~LOC~~ PSKT
6.0000 mg | PREFILLED_SYRINGE | Freq: Once | SUBCUTANEOUS | Status: AC
  Administered 2016-08-22: 6 mg via SUBCUTANEOUS
  Filled 2016-08-22: qty 0.6

## 2016-08-22 MED ORDER — SODIUM CHLORIDE 0.9 % IV SOLN
Freq: Once | INTRAVENOUS | Status: AC
  Administered 2016-08-22: 11:00:00 via INTRAVENOUS
  Filled 2016-08-22: qty 1000

## 2016-08-22 MED ORDER — DEXAMETHASONE SODIUM PHOSPHATE 10 MG/ML IJ SOLN
10.0000 mg | Freq: Once | INTRAMUSCULAR | Status: AC
  Administered 2016-08-22: 10 mg via INTRAVENOUS
  Filled 2016-08-22: qty 1

## 2016-08-22 MED ORDER — HEPARIN SOD (PORK) LOCK FLUSH 100 UNIT/ML IV SOLN
500.0000 [IU] | Freq: Once | INTRAVENOUS | Status: AC | PRN
  Administered 2016-08-22: 500 [IU]
  Filled 2016-08-22: qty 5

## 2016-08-22 MED ORDER — SODIUM CHLORIDE 0.9% FLUSH
10.0000 mL | INTRAVENOUS | Status: DC | PRN
  Administered 2016-08-22: 10 mL
  Filled 2016-08-22: qty 10

## 2016-08-22 MED ORDER — SODIUM CHLORIDE 0.9 % IV SOLN: 10.0000 mg | Freq: Once | INTRAVENOUS | Status: DC

## 2016-08-22 MED ORDER — SODIUM CHLORIDE 0.9 % IV SOLN
75.0000 mg/m2 | Freq: Once | INTRAVENOUS | Status: AC
  Administered 2016-08-22: 140 mg via INTRAVENOUS
  Filled 2016-08-22: qty 14

## 2016-08-22 NOTE — Progress Notes (Signed)
Patient is here for follow up, she is doing well 

## 2016-08-28 ENCOUNTER — Telehealth: Payer: Self-pay | Admitting: *Deleted

## 2016-08-28 MED ORDER — MAGIC MOUTHWASH W/LIDOCAINE: 5.0000 mL | Freq: Four times a day (QID) | ORAL | 1 refills | Status: DC

## 2016-08-28 NOTE — Telephone Encounter (Signed)
Per VO Dr Grayland [facility] and if she starts running fever, call back. Patient informed

## 2016-08-28 NOTE — Telephone Encounter (Signed)
Called to ask that something be done for her sore throat. She states it hurts to swallow anything with any thickness to it. Has trouble swallowing because of it. Please advise

## 2016-09-01 ENCOUNTER — Telehealth: Payer: Self-pay | Admitting: *Deleted

## 2016-09-01 ENCOUNTER — Telehealth: Payer: Self-pay | Admitting: Internal Medicine

## 2016-09-01 NOTE — Telephone Encounter (Signed)
Pt is aware to not use Anoro and will send to DR to advise/

## 2016-09-01 NOTE — Telephone Encounter (Signed)
The patient should stop the Anoro per Dr. Mathis Fare note. Please route the message to him to review and see if he wants to see her back or has any other thoughts. Thanks.

## 2016-09-01 NOTE — Telephone Encounter (Signed)
Returned call to patient and advised to call pulmanology doctor, she states she did and they have not returned her call. I advised her to try again, she stated she will call them and also that she just does not take the inhaler when she feels like that

## 2016-09-01 NOTE — Telephone Encounter (Signed)
Called to report that her inhalers are making her very tired and that she is still short of breath. Please advise

## 2016-09-01 NOTE — Telephone Encounter (Signed)
Spoke with pt. States that Anoro is not working for her. Reports that her SOB, wheezing and coughing are worse since starting Anoro. Pt states that she has been Anoro for 2 weeks now. She would like Dr. Mathis Fare recommendations.  Dr. Ashby Dawes please advise. Thanks.

## 2016-09-01 NOTE — Telephone Encounter (Signed)
Recommend she call pulmonary. That is exactly why I had them evaluate her.

## 2016-09-01 NOTE — Telephone Encounter (Signed)
Patient called regarding inhaler umeclidinium-vilanterol (ANORO ELLIPTA) 62.5-25 MCG/INH AEPB. Patient states that this inhaler isn't helping and making her feel exhausted. She feels like she needs a different medication.

## 2016-09-01 NOTE — Telephone Encounter (Signed)
Will send to Dr. Ashok Cordia, as Dr. Ashby Dawes has been unable to reach.   Dr. Ashok Cordia please advise. Thanks.

## 2016-09-02 NOTE — Telephone Encounter (Signed)
Agree with stopping anoro. We can try calling in prescription for nebulizer and iptratropium/albuterol, but doubtful that this would help.

## 2016-09-04 NOTE — Telephone Encounter (Signed)
Called and spoke to pt. Informed her of the recs per Dr. Ashby Dawes. Pt verbalized understanding and states she will hold off on Duoneb. Nothing further needed at this time.   Will forward to Dr. Ashby Dawes as Juluis Rainier.

## 2016-09-12 ENCOUNTER — Inpatient Hospital Stay: Payer: BLUE CROSS/BLUE SHIELD

## 2016-09-12 ENCOUNTER — Inpatient Hospital Stay (HOSPITAL_BASED_OUTPATIENT_CLINIC_OR_DEPARTMENT_OTHER): Payer: BLUE CROSS/BLUE SHIELD | Admitting: Oncology

## 2016-09-12 VITALS — BP 116/81 | HR 99

## 2016-09-12 VITALS — BP 86/55 | HR 94 | Temp 96.6°F | Resp 18 | Wt 170.9 lb

## 2016-09-12 DIAGNOSIS — I10 Essential (primary) hypertension: Secondary | ICD-10-CM

## 2016-09-12 DIAGNOSIS — R630 Anorexia: Secondary | ICD-10-CM

## 2016-09-12 DIAGNOSIS — R5381 Other malaise: Secondary | ICD-10-CM

## 2016-09-12 DIAGNOSIS — E7800 Pure hypercholesterolemia, unspecified: Secondary | ICD-10-CM

## 2016-09-12 DIAGNOSIS — R5383 Other fatigue: Secondary | ICD-10-CM

## 2016-09-12 DIAGNOSIS — M545 Low back pain: Secondary | ICD-10-CM

## 2016-09-12 DIAGNOSIS — C3491 Malignant neoplasm of unspecified part of right bronchus or lung: Secondary | ICD-10-CM

## 2016-09-12 DIAGNOSIS — J910 Malignant pleural effusion: Secondary | ICD-10-CM | POA: Diagnosis not present

## 2016-09-12 DIAGNOSIS — Z79899 Other long term (current) drug therapy: Secondary | ICD-10-CM

## 2016-09-12 DIAGNOSIS — C7802 Secondary malignant neoplasm of left lung: Secondary | ICD-10-CM | POA: Diagnosis not present

## 2016-09-12 DIAGNOSIS — Z923 Personal history of irradiation: Secondary | ICD-10-CM

## 2016-09-12 DIAGNOSIS — E876 Hypokalemia: Secondary | ICD-10-CM

## 2016-09-12 DIAGNOSIS — N289 Disorder of kidney and ureter, unspecified: Secondary | ICD-10-CM

## 2016-09-12 DIAGNOSIS — K219 Gastro-esophageal reflux disease without esophagitis: Secondary | ICD-10-CM

## 2016-09-12 DIAGNOSIS — R531 Weakness: Secondary | ICD-10-CM

## 2016-09-12 DIAGNOSIS — C7951 Secondary malignant neoplasm of bone: Secondary | ICD-10-CM | POA: Diagnosis not present

## 2016-09-12 DIAGNOSIS — R0602 Shortness of breath: Secondary | ICD-10-CM

## 2016-09-12 LAB — CBC WITH DIFFERENTIAL/PLATELET
BASOS ABS: 0.1 10*3/uL (ref 0–0.1)
Basophils Relative: 1 %
EOS PCT: 1 %
Eosinophils Absolute: 0.1 10*3/uL (ref 0–0.7)
HEMATOCRIT: 34 % — AB (ref 35.0–47.0)
Hemoglobin: 11.5 g/dL — ABNORMAL LOW (ref 12.0–16.0)
LYMPHS PCT: 20 %
Lymphs Abs: 1.5 10*3/uL (ref 1.0–3.6)
MCH: 27.5 pg (ref 26.0–34.0)
MCHC: 33.7 g/dL (ref 32.0–36.0)
MCV: 81.5 fL (ref 80.0–100.0)
MONO ABS: 1 10*3/uL — AB (ref 0.2–0.9)
MONOS PCT: 13 %
NEUTROS ABS: 4.9 10*3/uL (ref 1.4–6.5)
Neutrophils Relative %: 65 %
PLATELETS: 315 10*3/uL (ref 150–440)
RBC: 4.17 MIL/uL (ref 3.80–5.20)
RDW: 16.7 % — AB (ref 11.5–14.5)
WBC: 7.5 10*3/uL (ref 3.6–11.0)

## 2016-09-12 LAB — COMPREHENSIVE METABOLIC PANEL
ALT: 16 U/L (ref 14–54)
ANION GAP: 6 (ref 5–15)
AST: 22 U/L (ref 15–41)
Albumin: 3.1 g/dL — ABNORMAL LOW (ref 3.5–5.0)
Alkaline Phosphatase: 45 U/L (ref 38–126)
BILIRUBIN TOTAL: 0.7 mg/dL (ref 0.3–1.2)
BUN: 21 mg/dL — AB (ref 6–20)
CHLORIDE: 103 mmol/L (ref 101–111)
CO2: 26 mmol/L (ref 22–32)
Calcium: 8.4 mg/dL — ABNORMAL LOW (ref 8.9–10.3)
Creatinine, Ser: 0.55 mg/dL (ref 0.44–1.00)
Glucose, Bld: 105 mg/dL — ABNORMAL HIGH (ref 65–99)
POTASSIUM: 3.3 mmol/L — AB (ref 3.5–5.1)
Sodium: 135 mmol/L (ref 135–145)
TOTAL PROTEIN: 5.8 g/dL — AB (ref 6.5–8.1)

## 2016-09-12 MED ORDER — HEPARIN SOD (PORK) LOCK FLUSH 100 UNIT/ML IV SOLN
500.0000 [IU] | Freq: Once | INTRAVENOUS | Status: AC | PRN
  Administered 2016-09-12: 500 [IU]
  Filled 2016-09-12: qty 5

## 2016-09-12 MED ORDER — SODIUM CHLORIDE 0.9 % IV SOLN
Freq: Once | INTRAVENOUS | Status: AC
  Administered 2016-09-12: 11:00:00 via INTRAVENOUS
  Filled 2016-09-12: qty 1000

## 2016-09-12 MED ORDER — SODIUM CHLORIDE 0.9 % IV SOLN
75.0000 mg/m2 | Freq: Once | INTRAVENOUS | Status: AC
  Administered 2016-09-12: 140 mg via INTRAVENOUS
  Filled 2016-09-12: qty 14

## 2016-09-12 MED ORDER — PEGFILGRASTIM 6 MG/0.6ML ~~LOC~~ PSKT
6.0000 mg | PREFILLED_SYRINGE | Freq: Once | SUBCUTANEOUS | Status: AC
  Administered 2016-09-12: 6 mg via SUBCUTANEOUS
  Filled 2016-09-12: qty 0.6

## 2016-09-12 MED ORDER — ALPRAZOLAM 1 MG PO TABS: ORAL_TABLET | ORAL | 0 refills | Status: DC

## 2016-09-12 MED ORDER — DEXAMETHASONE SODIUM PHOSPHATE 10 MG/ML IJ SOLN
10.0000 mg | Freq: Once | INTRAMUSCULAR | Status: AC
  Administered 2016-09-12: 10 mg via INTRAVENOUS
  Filled 2016-09-12: qty 1

## 2016-09-12 MED ORDER — SODIUM CHLORIDE 0.9% FLUSH
10.0000 mL | INTRAVENOUS | Status: DC | PRN
  Administered 2016-09-12: 10 mL
  Filled 2016-09-12: qty 10

## 2016-09-12 MED ORDER — DEXAMETHASONE SODIUM PHOSPHATE 10 MG/ML IJ SOLN: 10.0000 mg | Freq: Once | INTRAMUSCULAR | Status: DC

## 2016-09-12 MED ORDER — SODIUM CHLORIDE 0.9 % IV SOLN: 10.0000 mg | Freq: Once | INTRAVENOUS | Status: DC

## 2016-09-12 NOTE — Progress Notes (Signed)
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 She currently feels fatigued, and reports sleeping approximately 15 hours/day. She complains of shortness of breath, dyspnea on exertion, wheezing, cough, and weakness. She has been taking Onoro inhaler x29 days, but feels it does not improve wheezing.  She has been taking morphine liquid daily.  She complains of intermittent low back pain, rating it 9/10 yesterday. She does not complain of left arm pain, but continues to have tingling that is helped with gabapentin. She does not complain of headache today. She denies any recent fevers. She has a poor appetite but denies weight loss. She denies any chest pain or tightness or hemoptysis. She reports one episode of vomiting after breakfast 3-4 days ago, resolved with phenergan.  She denies any diarrhea or constipation.  Patient offers no further specific complaints today.   REVIEW OF SYSTEMS:   Review of Systems  Constitutional: Positive for malaise/fatigue. Negative for diaphoresis, fever and weight loss.  Eyes: Negative.   Respiratory: Positive for cough, shortness of breath and wheezing.   Cardiovascular: Positive for leg swelling. Negative for chest pain and palpitations.  Gastrointestinal: Positive for nausea. Negative for abdominal pain, blood in stool, diarrhea and melena.  Genitourinary: Negative.   Musculoskeletal: Positive for back pain.       Left arm "tingling"  Skin: Negative.   Neurological: Positive for weakness.    As per HPI. Otherwise, a complete review of systems is  negative.   PAST MEDICAL HISTORY: Past Medical History:  Diagnosis Date  . Anemia 01/21/2015  . 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 Anxiety   . GERD (gastroesophageal reflux disease)   . Hypercholesteremia   . Hypertension   . Last menstrual period (LMP) > 10 days ago 2007  . Lung cancer ([place])   . Metastatic lung cancer (metastasis from lung to other site) [facility]) 01/21/2015    PAST SURGICAL HISTORY: Past Surgical History:  Procedure Laterality Date  . ABDOMINAL HYSTERECTOMY     partial  . INSERTION / PLACEMENT PLEURAL CATHETER    . PORTACATH PLACEMENT Right 04/26/2015   Procedure: INSERTION PORT-A-CATH;  Surgeon: Nestor Lewandowsky, MD;  Location: [facility] ORS;  Service: General;  Laterality: Right;    FAMILY HISTORY Family History  Problem Relation Age of Onset  . Cervical cancer Mother   . Uterine cancer Maternal Grandmother     GYNECOLOGIC HISTORY:  No LMP recorded. Patient has had a hysterectomy.     ADVANCED DIRECTIVES:    HEALTH MAINTENANCE: Social History  Substance Use Topics  . Smoking status: Former Smoker    Packs/day: 0.25    Years: 8.00    Types: Cigarettes    Quit date: 07/20/2014  . Smokeless tobacco: Never Used  . Alcohol use No    Allergies  Allergen Reactions  . Tegaderm Ag Mesh [Silver] Other (See Comments)    blisters  . Aspirin Other (See Comments)    GIB    Current Outpatient Prescriptions  Medication Sig Dispense Refill  . ALPRAZolam (XANAX) 1 MG tablet TAKE 1/2 BY MOUTH 1 TO 2 TIMES DAILY AS NEEDED FOR ANXIETY  AND TAKE 1 BY MOUTH AT NIGHT FOR ANXIETY/SLEEP 45 tablet 0  . docusate sodium (COLACE) 100 MG capsule Take 100 mg by mouth 2 (two) times daily as needed for mild constipation.    . furosemide (LASIX) 20 MG tablet Take 1 tablet (20 mg total) by mouth daily. 30 tablet 2  . gabapentin (NEURONTIN) 300 MG capsule Take 1 capsule (300 mg total) by mouth 3 (three) times daily. 270 capsule 3  . lidocaine-prilocaine (EMLA) cream Apply cream 1  hour before chemotherapy treatment 30 g 1  . lisinopril-hydrochlorothiazide (PRINZIDE,ZESTORETIC) 10-12.5 MG per tablet Take 1 tablet by mouth daily.    [name] loratadine (CLARITIN) 10 MG tablet Take 10 mg by mouth daily.    [name] morphine 10 MG/5ML solution Take 2.5 ml every 4-6 hours as needed for dyspnea. 100 mL 0  . Multiple Vitamins-Minerals (MULTIVITAMIN WITH MINERALS) tablet Take 1 tablet by mouth daily. 30 tablet 3  . omeprazole (PRILOSEC) 20 MG capsule Take 1 capsule (20 mg total) by mouth daily. 30 capsule 0  . oxyCODONE (OXY IR/ROXICODONE) 5 MG immediate release tablet Take 1 tablet (5 mg total) by mouth 3 (three) times daily as needed for severe pain. 90 tablet 0  . potassium chloride SA (K-DUR,KLOR-CON) 20 MEQ tablet Take 1 tablet (20 mEq total) by mouth daily. 90 tablet 1  . promethazine (PHENERGAN) 25 MG tablet Take 1 tablet (25 mg total) by mouth every 6 (six) hours as needed for nausea or vomiting. 30 tablet 0  . simvastatin (ZOCOR) 20 MG tablet Take 20 mg by mouth at bedtime.     . vitamin B-12 (CYANOCOBALAMIN) 1000 MCG tablet Take 1,000 mcg by mouth daily.    . predniSONE (STERAPRED UNI-PAK 21 TAB) 10 MG (21) TBPK tablet Take 1 tablet (10 mg total) by mouth daily. Taper as directed. 21 tablet 0   No current facility-administered medications for this visit.    Facility-Administered Medications Ordered in Other Visits  Medication Dose Route Frequency Provider Last Rate Last Dose  . heparin lock flush 100 unit/mL  250 Units Intracatheter PRN Evlyn Kanner, NP      . sodium chloride 0.9 % injection 10 mL  10 mL Intracatheter PRN Evlyn Kanner, NP      . sodium chloride 0.9 % injection 10 mL  10 mL Intravenous PRN Leia Alf, MD   10 mL at 04/27/15 0905  . sodium chloride 0.9 % injection 3 mL  3 mL Intracatheter PRN Evlyn Kanner, NP        OBJECTIVE: BP (!) 86/55 (BP Location: Right Arm, Patient Position: Sitting)   Pulse 94   Temp (!) 96.6 F (35.9 C) (Tympanic)   Resp  18   Wt 170 lb 13.7 oz (77.5 kg)   BMI 28.43 kg/m    Body mass index is 28.43 kg/m.    ECOG FS:1 - Symptomatic but completely ambulatory  General: Well-developed, well-nourished, no acute distress. Eyes: Pink conjunctiva, anicteric sclera. Lungs: Clear to auscultation bilaterally. Heart: Regular rate and rhythm. No rubs, murmurs, or gallops. Musculoskeletal: No edemal, No cyanosis, or clubbing.  Neuro: Alert, answering all questions appropriately.  Skin: No rashes or petechiae noted. Psych: Normal affect.    LAB RESULTS:  Appointment on 09/12/2016  Component Date Value Ref Range Status  . WBC 09/12/2016 7.5  3.6 - 11.0 K/uL Final  . RBC 09/12/2016 4.17  3.80 - 5.20 MIL/uL Final  . Hemoglobin 09/12/2016 11.5* 12.0 - 16.0 g/dL Final  .  HCT 09/12/2016 34.0* 35.0 - 47.0 % Final  . MCV 09/12/2016 81.5  80.0 - 100.0 fL Final  . MCH 09/12/2016 27.5  26.0 - 34.0 pg Final  . MCHC 09/12/2016 33.7  32.0 - 36.0 g/dL Final  . RDW 09/12/2016 16.7* 11.5 - 14.5 % Final  . Platelets 09/12/2016 315  150 - 440 K/uL Final  . Neutrophils Relative % 09/12/2016 65  % Final  . Neutro Abs 09/12/2016 4.9  1.4 - 6.5 K/uL Final  . Lymphocytes Relative 09/12/2016 20  % Final  . Lymphs Abs 09/12/2016 1.5  1.0 - 3.6 K/uL Final  . Monocytes Relative 09/12/2016 13  % Final  . Monocytes Absolute 09/12/2016 1.0* 0.2 - 0.9 K/uL Final  . Eosinophils Relative 09/12/2016 1  % Final  . Eosinophils Absolute 09/12/2016 0.1  0 - 0.7 K/uL Final  . Basophils Relative 09/12/2016 1  % Final  . Basophils Absolute 09/12/2016 0.1  0 - 0.1 K/uL Final  . Sodium 09/12/2016 135  135 - 145 mmol/L Final  . Potassium 09/12/2016 3.3* 3.5 - 5.1 mmol/L Final  . Chloride 09/12/2016 103  101 - 111 mmol/L Final  . CO2 09/12/2016 26  22 - 32 mmol/L Final  . Glucose, Bld 09/12/2016 105* 65 - 99 mg/dL Final  . BUN 09/12/2016 21* 6 - 20 mg/dL Final  . Creatinine, Ser 09/12/2016 0.55  0.44 - 1.00 mg/dL Final  . Calcium 09/12/2016 8.4*  8.9 - 10.3 mg/dL Final  . Total Protein 09/12/2016 5.8* 6.5 - 8.1 g/dL Final  . Albumin 09/12/2016 3.1* 3.5 - 5.0 g/dL Final  . AST 09/12/2016 22  15 - 41 U/L Final  . ALT 09/12/2016 16  14 - 54 U/L Final  . Alkaline Phosphatase 09/12/2016 45  38 - 126 U/L Final  . Total Bilirubin 09/12/2016 0.7  0.3 - 1.2 mg/dL Final  . GFR calc non Af Amer 09/12/2016 >60  >60 mL/min Final  . GFR calc Af Amer 09/12/2016 >60  >60 mL/min Final   Comment: (NOTE) The eGFR has been calculated using the CKD EPI equation. This calculation has not been validated in all clinical situations. eGFR's persistently <60 mL/min signify possible Chronic Kidney Disease.   . Anion gap 09/12/2016 6  5 - 15 Final    STUDIES:  PET scan (July 07, 2016) IMPRESSION: 1. Overall interval decrease in the innumerable bilateral pulmonary nodules although lower lung volumes today air associated with increased bibasilar collapse/consolidation. 2. Stable moderate right pleural effusion with interval development of a new moderate left pleural effusion. 3. Interval resolution in hilar hypermetabolism. 4. Interval marked improvement of hypermetabolism in the C6 and L2 vertebral lesions. 5. Diffusely increased FDG uptake in the marrow spaces today presumably related to G-CSF treatment.  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 On 12/01/14 - Right Pleural Fluid Cytology. POSITIVE FOR MALIGNANT CELLS. ADENOCARCINOMA, CONSISTENT WITH LUNG ORIGIN. Molecular testing for EGFR, ALK and others is negative  PLAN:   1. 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  Patient also progressed on nivolumab. Previously patient was tested and no driver mutation was present. PET scan  results from July 07, 2016 reviewed independently and reported as above with improved disease burden, but likely residual malignancy. Proceed with cycle 7  of single agent Taxotere 75 mg/m every 3 weeks today. Will also use OnPro Neulasta for white blood cell count support.  Will repeat PET scan in ~January 2018. Return to clinic in 3 weeks for consideration of cycle 8. 2. Headaches: MRI the brain completed on March 03, 2016 is essentially unchanged from previous.  3.  Pain (back, hip, arm): MRI and bone scan results reviewed independently revealing metastatic disease in C6 there is likely causing her symptoms. Lumbar MRI revealed an enhancing lesion and L2 as well as a suspicious 12 mm lesion and right sacrum. Patient has completed XRT. Continue gabapentin 300 mg 3 times per day. Continue 10 mg oxycodone as needed.  4.  Kidney lesion: Unclear if metastatic deposit or second primary of the left kidney. Continue to monitor closely.  5.  Anxiety: Resolved. Continue Xanax 1 mg at night and 0.5 mg as needed during the day.     6.  Hypokalemia: Patient has been instructed to continue oral potassium supplementation.     7.  Bony lesions: Consider Zometa in the future.     8.  Peripheral edema: Continue Lasix PRN. Will use with caution given patient's history of dehydration.     9.  Shortness of breath/dyspnea exertion: Patient was evaluated by pulmonary.    Lucendia Herrlich, NP 09/12/16  Patient was seen and evaluated independently and I agree with the assessment and plan as dictated above. Patient was given a Medrol Dosepak to see if her shortness of breath this will improve.  Lloyd Huger, MD 09/14/16 8:57 AM

## 2016-09-12 NOTE — Progress Notes (Signed)
Offers no complaints  

## 2016-09-13 ENCOUNTER — Telehealth: Payer: Self-pay | Admitting: *Deleted

## 2016-09-13 ENCOUNTER — Telehealth: Payer: Self-pay | Admitting: Internal Medicine

## 2016-09-13 ENCOUNTER — Other Ambulatory Visit: Payer: Self-pay | Admitting: *Deleted

## 2016-09-13 MED ORDER — PREDNISONE 10 MG (21) PO TBPK: 10.0000 mg | ORAL_TABLET | Freq: Every day | ORAL | 0 refills | Status: DC

## 2016-09-13 NOTE — Telephone Encounter (Signed)
Medrol dose pack please. Unclear on xanax.

## 2016-09-13 NOTE — Telephone Encounter (Signed)
Spoke with the pt. She was confused on why I was calling her. She stated she was unaware why their is a phone message put in for [place]. I asked her is there anything that she needs from [place] she stated she did not. Looks like there were previous phone messages about her inhaler and she isn't using one. Nothing further is needed at this time.

## 2016-09-13 NOTE — Telephone Encounter (Signed)
Pt calling having some issues with medication we gave her  The inhaler causes her to breath hard The liquid is easy to take  Her question is, is she to take both of them Please advise

## 2016-09-13 NOTE — Telephone Encounter (Signed)
States we sent in Xanax for her and she does not think that is what he wanted her to have, please advise She called a second time asking about steroids, is it to be an inhaler or oral?

## 2016-09-19 ENCOUNTER — Other Ambulatory Visit: Payer: Self-pay | Admitting: *Deleted

## 2016-09-19 MED ORDER — OXYCODONE HCL 5 MG PO TABS: 5.0000 mg | ORAL_TABLET | Freq: Three times a day (TID) | ORAL | 0 refills | Status: AC | PRN

## 2016-10-02 NOTE — Progress Notes (Signed)
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 She continues to have chronic shortness of breath and dyspnea on exertion, but otherwise feels well. Her wheezing has improved. Her weakness and fatigue is unchanged. She does not complain of back pain today.  She does not complain of headache today. She denies any recent fevers. She has a good appetite. She denies any chest pain or tightness or hemoptysis. She denies any nausea, vomiting, constipation, or diarrhea. She has no urinary complaints.  Patient offers no further specific complaints today.   REVIEW OF SYSTEMS:   Review of Systems  Constitutional: Positive for malaise/fatigue. Negative for diaphoresis, fever and weight loss.  Eyes: Negative.   Respiratory: Positive for shortness of breath and wheezing. Negative for cough.   Cardiovascular: Negative for chest pain, palpitations and leg swelling.  Gastrointestinal: Negative.  Negative for abdominal pain, blood in stool, diarrhea, melena and nausea.  Genitourinary: Negative.   Musculoskeletal: Negative.  Negative for back pain.       Left arm "tingling"  Skin: Negative.   Neurological: Positive for sensory change and weakness.  Psychiatric/Behavioral: Negative.  The patient is not nervous/anxious.     As per HPI. Otherwise, a complete review of systems is negative.   PAST MEDICAL HISTORY: Past Medical History:  Diagnosis Date  . Anemia 01/21/2015  . 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 Anxiety   . GERD (gastroesophageal reflux disease)   . Hypercholesteremia   .  Hypertension   . Last menstrual period (LMP) > 10 days ago 2007  . Lung cancer ([place])   . Metastatic lung cancer (metastasis from lung to other site) [facility]) 01/21/2015    PAST SURGICAL HISTORY: Past Surgical History:  Procedure Laterality Date  . ABDOMINAL HYSTERECTOMY     partial  . INSERTION / PLACEMENT PLEURAL CATHETER    . PORTACATH PLACEMENT Right 04/26/2015   Procedure: INSERTION PORT-A-CATH;  Surgeon: Nestor Lewandowsky, MD;  Location: [facility] ORS;  Service: General;  Laterality: Right;    FAMILY HISTORY Family History  Problem Relation Age of Onset  . Cervical cancer Mother   . Uterine cancer Maternal Grandmother     GYNECOLOGIC HISTORY:  No LMP recorded. Patient has had a hysterectomy.     ADVANCED DIRECTIVES:    HEALTH MAINTENANCE: Social History  Substance Use Topics  . Smoking status: Former Smoker    Packs/day: 0.25    Years: 8.00    Types: Cigarettes    Quit date: 07/20/2014  . Smokeless tobacco: Never Used  . Alcohol use No    Allergies  Allergen Reactions  . Tegaderm Ag Mesh [Silver] Other (See Comments)    blisters  . Aspirin Other (See Comments)    GIB    Current Outpatient Prescriptions  Medication Sig Dispense Refill  . ALPRAZolam (XANAX) 1 MG tablet TAKE 1/2 BY MOUTH 1 TO 2 TIMES DAILY AS NEEDED FOR ANXIETY AND TAKE 1 BY MOUTH AT NIGHT FOR ANXIETY/SLEEP 45 tablet 0  . docusate sodium (COLACE) 100 MG capsule Take 100 mg by mouth 2 (two) times daily as needed for mild constipation.    [name]  furosemide (LASIX) 20 MG tablet Take 1 tablet (20 mg total) by mouth daily. 30 tablet 2  . gabapentin (NEURONTIN) 300 MG capsule Take 1 capsule (300 mg total) by mouth 3 (three) times daily. 270 capsule 3  . lidocaine-prilocaine (EMLA) cream Apply cream 1 hour before chemotherapy treatment 30 g 1  . lisinopril-hydrochlorothiazide (PRINZIDE,ZESTORETIC) 10-12.5 MG per tablet Take 1 tablet by mouth daily.    [name] loratadine (CLARITIN) 10 MG tablet Take 10 mg by mouth daily.    .  Multiple Vitamins-Minerals (MULTIVITAMIN WITH MINERALS) tablet Take 1 tablet by mouth daily. 30 tablet 3  . omeprazole (PRILOSEC) 20 MG capsule Take 1 capsule (20 mg total) by mouth daily. 30 capsule 0  . oxyCODONE (OXY IR/ROXICODONE) 5 MG immediate release tablet Take 1 tablet (5 mg total) by mouth 3 (three) times daily as needed for severe pain. 90 tablet 0  . potassium chloride SA (K-DUR,KLOR-CON) 20 MEQ tablet Take 1 tablet (20 mEq total) by mouth daily. 90 tablet 1  . promethazine (PHENERGAN) 25 MG tablet Take 1 tablet (25 mg total) by mouth every 6 (six) hours as needed for nausea or vomiting. 30 tablet 0  . simvastatin (ZOCOR) 20 MG tablet Take 20 mg by mouth at bedtime.     . vitamin B-12 (CYANOCOBALAMIN) 1000 MCG tablet Take 1,000 mcg by mouth daily.     No current facility-administered medications for this visit.    Facility-Administered Medications Ordered in Other Visits  Medication Dose Route Frequency Provider Last Rate Last Dose  . heparin lock flush 100 unit/mL  250 Units Intracatheter PRN Evlyn Kanner, NP      . sodium chloride 0.9 % injection 10 mL  10 mL Intracatheter PRN Evlyn Kanner, NP      . sodium chloride 0.9 % injection 10 mL  10 mL Intravenous PRN Leia Alf, MD   10 mL at 04/27/15 0905  . sodium chloride 0.9 % injection 3 mL  3 mL Intracatheter PRN Evlyn Kanner, NP        OBJECTIVE: BP 111/78 (BP Location: Right Arm, Patient Position: Sitting)   Pulse (!) 109   Temp 97 F (36.1 C) (Tympanic)   Resp 18   Wt 164 lb 12.7 oz (74.7 kg)   BMI 27.42 kg/m    Body mass index is 27.42 kg/m.    ECOG FS:1 - Symptomatic but completely ambulatory  General: Well-developed, well-nourished, no acute distress. Eyes: Pink conjunctiva, anicteric sclera. Lungs: Clear to auscultation bilaterally. Heart: Regular rate and rhythm. No rubs, murmurs, or gallops. Musculoskeletal: No edemal, No cyanosis, or clubbing.  Neuro: Alert, answering all questions  appropriately.  Skin: No rashes or petechiae noted. Psych: Normal affect.    LAB RESULTS:  Appointment on 10/03/2016  Component Date Value Ref Range Status  . WBC 10/03/2016 7.9  3.6 - 11.0 K/uL Final  . RBC 10/03/2016 4.30  3.80 - 5.20 MIL/uL Final  . Hemoglobin 10/03/2016 11.5* 12.0 - 16.0 g/dL Final  . HCT 10/03/2016 34.8* 35.0 - 47.0 % Final  . MCV 10/03/2016 80.8  80.0 - 100.0 fL Final  . MCH 10/03/2016 26.8  26.0 - 34.0 pg Final  . MCHC 10/03/2016 33.1  32.0 - 36.0 g/dL Final  . RDW 10/03/2016 18.1* 11.5 - 14.5 % Final  . Platelets 10/03/2016 325  150 - 440 K/uL Final  . Neutrophils Relative % 10/03/2016 70  % Final  . Neutro Abs 10/03/2016 5.6  1.4 - 6.5 K/uL Final  .  Lymphocytes Relative 10/03/2016 19  % Final  . Lymphs Abs 10/03/2016 1.5  1.0 - 3.6 K/uL Final  . Monocytes Relative 10/03/2016 10  % Final  . Monocytes Absolute 10/03/2016 0.8  0.2 - 0.9 K/uL Final  . Eosinophils Relative 10/03/2016 1  % Final  . Eosinophils Absolute 10/03/2016 0.1  0 - 0.7 K/uL Final  . Basophils Relative 10/03/2016 0  % Final  . Basophils Absolute 10/03/2016 0.0  0 - 0.1 K/uL Final  . Sodium 10/03/2016 136  135 - 145 mmol/L Final  . Potassium 10/03/2016 3.2* 3.5 - 5.1 mmol/L Final  . Chloride 10/03/2016 104  101 - 111 mmol/L Final  . CO2 10/03/2016 26  22 - 32 mmol/L Final  . Glucose, Bld 10/03/2016 104* 65 - 99 mg/dL Final  . BUN 10/03/2016 14  6 - 20 mg/dL Final  . Creatinine, Ser 10/03/2016 0.46  0.44 - 1.00 mg/dL Final  . Calcium 10/03/2016 8.5* 8.9 - 10.3 mg/dL Final  . Total Protein 10/03/2016 6.0* 6.5 - 8.1 g/dL Final  . Albumin 10/03/2016 3.0* 3.5 - 5.0 g/dL Final  . AST 10/03/2016 20  15 - 41 U/L Final  . ALT 10/03/2016 12* 14 - 54 U/L Final  . Alkaline Phosphatase 10/03/2016 51  38 - 126 U/L Final  . Total Bilirubin 10/03/2016 1.0  0.3 - 1.2 mg/dL Final  . GFR calc non Af Amer 10/03/2016 >60  >60 mL/min Final  . GFR calc Af Amer 10/03/2016 >60  >60 mL/min Final   Comment:  (NOTE) The eGFR has been calculated using the CKD EPI equation. This calculation has not been validated in all clinical situations. eGFR's persistently <60 mL/min signify possible Chronic Kidney Disease.   . Anion gap 10/03/2016 6  5 - 15 Final    STUDIES:  PET scan (July 07, 2016) IMPRESSION: 1. Overall interval decrease in the innumerable bilateral pulmonary nodules although lower lung volumes today air associated with increased bibasilar collapse/consolidation. 2. Stable moderate right pleural effusion with interval development of a new moderate left pleural effusion. 3. Interval resolution in hilar hypermetabolism. 4. Interval marked improvement of hypermetabolism in the C6 and L2 vertebral lesions. 5. Diffusely increased FDG uptake in the marrow spaces today presumably related to G-CSF treatment.  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 On 12/01/14 - Right Pleural Fluid Cytology. POSITIVE FOR MALIGNANT CELLS. ADENOCARCINOMA, CONSISTENT WITH LUNG ORIGIN. Molecular testing for EGFR, ALK and others is negative  PLAN:   1. 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  Patient also progressed on nivolumab. Previously patient was tested and no driver mutation was present. PET scan results from July 07, 2016 reviewed independently and reported as above with improved disease burden, but likely residual malignancy. Proceed with cycle 8 of single agent Taxotere 75 mg/m every 3 weeks today. Will also use OnPro Neulasta for white blood cell count support.  Will repeat PET scan prior to next clinic visit. Return to clinic in 3 weeks for consideration of cycle 9. 2. Headaches: MRI the brain completed on March 03, 2016 is  essentially unchanged from previous.  3.  Pain (back, hip, arm): MRI and bone scan results reviewed independently revealing metastatic disease in C6 there is likely causing her symptoms. Lumbar MRI revealed  an enhancing lesion and L2 as well as a suspicious 12 mm lesion and right sacrum. Patient has completed XRT. Continue gabapentin 300 mg 3 times per day. Continue 10 mg oxycodone as needed.  4.  Kidney lesion: Unclear if metastatic deposit or second primary of the left kidney. Continue to monitor closely.  5.  Anxiety: Resolved. Continue Xanax 1 mg at night and 0.5 mg as needed during the day.     6.  Hypokalemia: Patient has been instructed to continue oral potassium supplementation.     7.  Bony lesions: Consider Zometa in the future.     8.  Peripheral edema: Continue Lasix PRN. Will use with caution given patient's history of dehydration.     9.  Shortness of breath/dyspnea exertion: Patient was evaluated by pulmonary.   Patient understands she can return to clinic at any time if she has any questions, concerns, or complaints.   Lloyd Huger, MD 10/03/16 10:12 AM

## 2016-10-03 ENCOUNTER — Inpatient Hospital Stay: Payer: BLUE CROSS/BLUE SHIELD

## 2016-10-03 ENCOUNTER — Inpatient Hospital Stay: Payer: BLUE CROSS/BLUE SHIELD | Attending: Oncology | Admitting: Oncology

## 2016-10-03 VITALS — BP 111/78 | HR 109 | Temp 97.0°F | Resp 18 | Wt 164.8 lb

## 2016-10-03 DIAGNOSIS — K219 Gastro-esophageal reflux disease without esophagitis: Secondary | ICD-10-CM | POA: Insufficient documentation

## 2016-10-03 DIAGNOSIS — R600 Localized edema: Secondary | ICD-10-CM | POA: Diagnosis not present

## 2016-10-03 DIAGNOSIS — J910 Malignant pleural effusion: Secondary | ICD-10-CM | POA: Insufficient documentation

## 2016-10-03 DIAGNOSIS — R51 Headache: Secondary | ICD-10-CM | POA: Insufficient documentation

## 2016-10-03 DIAGNOSIS — I10 Essential (primary) hypertension: Secondary | ICD-10-CM | POA: Diagnosis not present

## 2016-10-03 DIAGNOSIS — C7802 Secondary malignant neoplasm of left lung: Secondary | ICD-10-CM | POA: Insufficient documentation

## 2016-10-03 DIAGNOSIS — N289 Disorder of kidney and ureter, unspecified: Secondary | ICD-10-CM | POA: Insufficient documentation

## 2016-10-03 DIAGNOSIS — R531 Weakness: Secondary | ICD-10-CM | POA: Insufficient documentation

## 2016-10-03 DIAGNOSIS — M549 Dorsalgia, unspecified: Secondary | ICD-10-CM | POA: Diagnosis not present

## 2016-10-03 DIAGNOSIS — M79603 Pain in arm, unspecified: Secondary | ICD-10-CM | POA: Insufficient documentation

## 2016-10-03 DIAGNOSIS — R5383 Other fatigue: Secondary | ICD-10-CM | POA: Diagnosis not present

## 2016-10-03 DIAGNOSIS — M25559 Pain in unspecified hip: Secondary | ICD-10-CM | POA: Insufficient documentation

## 2016-10-03 DIAGNOSIS — Z79899 Other long term (current) drug therapy: Secondary | ICD-10-CM | POA: Diagnosis not present

## 2016-10-03 DIAGNOSIS — Z90710 Acquired absence of both cervix and uterus: Secondary | ICD-10-CM | POA: Insufficient documentation

## 2016-10-03 DIAGNOSIS — R0602 Shortness of breath: Secondary | ICD-10-CM | POA: Insufficient documentation

## 2016-10-03 DIAGNOSIS — R202 Paresthesia of skin: Secondary | ICD-10-CM | POA: Insufficient documentation

## 2016-10-03 DIAGNOSIS — E7800 Pure hypercholesterolemia, unspecified: Secondary | ICD-10-CM | POA: Insufficient documentation

## 2016-10-03 DIAGNOSIS — E860 Dehydration: Secondary | ICD-10-CM | POA: Insufficient documentation

## 2016-10-03 DIAGNOSIS — E876 Hypokalemia: Secondary | ICD-10-CM | POA: Diagnosis not present

## 2016-10-03 DIAGNOSIS — C3491 Malignant neoplasm of unspecified part of right bronchus or lung: Secondary | ICD-10-CM

## 2016-10-03 DIAGNOSIS — C7951 Secondary malignant neoplasm of bone: Secondary | ICD-10-CM | POA: Insufficient documentation

## 2016-10-03 DIAGNOSIS — R5381 Other malaise: Secondary | ICD-10-CM | POA: Insufficient documentation

## 2016-10-03 DIAGNOSIS — Z5111 Encounter for antineoplastic chemotherapy: Secondary | ICD-10-CM | POA: Diagnosis not present

## 2016-10-03 DIAGNOSIS — Z8049 Family history of malignant neoplasm of other genital organs: Secondary | ICD-10-CM | POA: Insufficient documentation

## 2016-10-03 DIAGNOSIS — Z923 Personal history of irradiation: Secondary | ICD-10-CM | POA: Insufficient documentation

## 2016-10-03 LAB — CBC WITH DIFFERENTIAL/PLATELET
BASOS ABS: 0 10*3/uL (ref 0–0.1)
Basophils Relative: 0 %
EOS ABS: 0.1 10*3/uL (ref 0–0.7)
EOS PCT: 1 %
HCT: 34.8 % — ABNORMAL LOW (ref 35.0–47.0)
Hemoglobin: 11.5 g/dL — ABNORMAL LOW (ref 12.0–16.0)
LYMPHS ABS: 1.5 10*3/uL (ref 1.0–3.6)
Lymphocytes Relative: 19 %
MCH: 26.8 pg (ref 26.0–34.0)
MCHC: 33.1 g/dL (ref 32.0–36.0)
MCV: 80.8 fL (ref 80.0–100.0)
Monocytes Absolute: 0.8 10*3/uL (ref 0.2–0.9)
Monocytes Relative: 10 %
Neutro Abs: 5.6 10*3/uL (ref 1.4–6.5)
Neutrophils Relative %: 70 %
PLATELETS: 325 10*3/uL (ref 150–440)
RBC: 4.3 MIL/uL (ref 3.80–5.20)
RDW: 18.1 % — ABNORMAL HIGH (ref 11.5–14.5)
WBC: 7.9 10*3/uL (ref 3.6–11.0)

## 2016-10-03 LAB — COMPREHENSIVE METABOLIC PANEL
ALT: 12 U/L — AB (ref 14–54)
ANION GAP: 6 (ref 5–15)
AST: 20 U/L (ref 15–41)
Albumin: 3 g/dL — ABNORMAL LOW (ref 3.5–5.0)
Alkaline Phosphatase: 51 U/L (ref 38–126)
BUN: 14 mg/dL (ref 6–20)
CHLORIDE: 104 mmol/L (ref 101–111)
CO2: 26 mmol/L (ref 22–32)
CREATININE: 0.46 mg/dL (ref 0.44–1.00)
Calcium: 8.5 mg/dL — ABNORMAL LOW (ref 8.9–10.3)
GFR calc non Af Amer: >60 mL/min (ref >60)
Glucose, Bld: 104 mg/dL — ABNORMAL HIGH (ref 65–99)
Potassium: 3.2 mmol/L — ABNORMAL LOW (ref 3.5–5.1)
SODIUM: 136 mmol/L (ref 135–145)
Total Bilirubin: 1 mg/dL (ref 0.3–1.2)
Total Protein: 6 g/dL — ABNORMAL LOW (ref 6.5–8.1)

## 2016-10-03 MED ORDER — DEXAMETHASONE SODIUM PHOSPHATE 10 MG/ML IJ SOLN
10.0000 mg | Freq: Once | INTRAMUSCULAR | Status: AC
  Administered 2016-10-03: 10 mg via INTRAVENOUS
  Filled 2016-10-03: qty 1

## 2016-10-03 MED ORDER — PEGFILGRASTIM 6 MG/0.6ML ~~LOC~~ PSKT
6.0000 mg | PREFILLED_SYRINGE | Freq: Once | SUBCUTANEOUS | Status: AC
  Administered 2016-10-03: 6 mg via SUBCUTANEOUS
  Filled 2016-10-03: qty 0.6

## 2016-10-03 MED ORDER — SODIUM CHLORIDE 0.9 % IV SOLN
Freq: Once | INTRAVENOUS | Status: AC
  Administered 2016-10-03: 11:00:00 via INTRAVENOUS
  Filled 2016-10-03: qty 1000

## 2016-10-03 MED ORDER — DEXTROSE 5 % IV SOLN
75.0000 mg/m2 | Freq: Once | INTRAVENOUS | Status: AC
  Administered 2016-10-03: 140 mg via INTRAVENOUS
  Filled 2016-10-03: qty 14

## 2016-10-03 MED ORDER — HEPARIN SOD (PORK) LOCK FLUSH 100 UNIT/ML IV SOLN
500.0000 [IU] | Freq: Once | INTRAVENOUS | Status: AC | PRN
  Administered 2016-10-03: 500 [IU]
  Filled 2016-10-03 (×2): qty 5

## 2016-10-03 NOTE — Progress Notes (Signed)
Complains of shortness of breath when ambulating to bathroom at home.

## 2016-10-06 ENCOUNTER — Telehealth: Payer: Self-pay | Admitting: *Deleted

## 2016-10-06 NOTE — Telephone Encounter (Signed)
Pt questions if needs refill on medrol dose pak. If patient finished prescription then a refill is not required at this time. Attempted to call pt without success. Will try again later.

## 2016-10-07 ENCOUNTER — Emergency Department
Admission: EM | Admit: 2016-10-07 | Discharge: 2016-10-07 | Disposition: A | Discharge status: Home / Self Care | Payer: BLUE CROSS/BLUE SHIELD | Attending: Emergency Medicine | Admitting: Emergency Medicine

## 2016-10-07 ENCOUNTER — Other Ambulatory Visit: Payer: Self-pay

## 2016-10-07 ENCOUNTER — Emergency Department: Payer: BLUE CROSS/BLUE SHIELD

## 2016-10-07 ENCOUNTER — Encounter: Payer: Self-pay | Admitting: Emergency Medicine

## 2016-10-07 DIAGNOSIS — R531 Weakness: Secondary | ICD-10-CM | POA: Diagnosis present

## 2016-10-07 DIAGNOSIS — Z85118 Personal history of other malignant neoplasm of bronchus and lung: Secondary | ICD-10-CM | POA: Diagnosis not present

## 2016-10-07 DIAGNOSIS — Z79899 Other long term (current) drug therapy: Secondary | ICD-10-CM | POA: Diagnosis not present

## 2016-10-07 DIAGNOSIS — I10 Essential (primary) hypertension: Secondary | ICD-10-CM | POA: Diagnosis not present

## 2016-10-07 DIAGNOSIS — R0602 Shortness of breath: Secondary | ICD-10-CM | POA: Diagnosis not present

## 2016-10-07 DIAGNOSIS — Z87891 Personal history of nicotine dependence: Secondary | ICD-10-CM | POA: Insufficient documentation

## 2016-10-07 LAB — COMPREHENSIVE METABOLIC PANEL
ALBUMIN: 2.9 g/dL — AB (ref 3.5–5.0)
ALK PHOS: 69 U/L (ref 38–126)
ALT: 12 U/L — ABNORMAL LOW (ref 14–54)
ANION GAP: 6 (ref 5–15)
AST: 21 U/L (ref 15–41)
BILIRUBIN TOTAL: 1.4 mg/dL — AB (ref 0.3–1.2)
BUN: 13 mg/dL (ref 6–20)
CO2: 25 mmol/L (ref 22–32)
Calcium: 8.3 mg/dL — ABNORMAL LOW (ref 8.9–10.3)
Chloride: 105 mmol/L (ref 101–111)
Creatinine, Ser: 0.43 mg/dL — ABNORMAL LOW (ref 0.44–1.00)
GFR calc Af Amer: >60 mL/min (ref >60)
GFR calc non Af Amer: >60 mL/min (ref >60)
GLUCOSE: 105 mg/dL — AB (ref 65–99)
POTASSIUM: 3.2 mmol/L — AB (ref 3.5–5.1)
SODIUM: 136 mmol/L (ref 135–145)
TOTAL PROTEIN: 5.8 g/dL — AB (ref 6.5–8.1)

## 2016-10-07 LAB — CBC
HEMATOCRIT: 34 % — AB (ref 35.0–47.0)
Hemoglobin: 11.3 g/dL — ABNORMAL LOW (ref 12.0–16.0)
MCH: 27.1 pg (ref 26.0–34.0)
MCHC: 33.2 g/dL (ref 32.0–36.0)
MCV: 81.5 fL (ref 80.0–100.0)
PLATELETS: 164 10*3/uL (ref 150–440)
RBC: 4.17 MIL/uL (ref 3.80–5.20)
RDW: 17.7 % — AB (ref 11.5–14.5)
WBC: 16 10*3/uL — AB (ref 3.6–11.0)

## 2016-10-07 LAB — TROPONIN I: Troponin I: <0.03 ng/mL (ref <0.03)

## 2016-10-07 NOTE — ED Notes (Signed)
Report given to Susan, RN

## 2016-10-07 NOTE — ED Provider Notes (Signed)
[facility] Emergency Department Provider Note  Time seen: 8:50 AM  I have reviewed the triage vital signs and the nursing notes.   HISTORY  Chief Complaint Shortness of Breath    HPI Patricia Kelley is a 61 y.o. 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 According to the patient she has had 2 falls over the past 3 days which is very abnormal for her. States a sense of generalized fatigue and weakness. Denies any headache, focal weakness or numbness. Patient does state mild shortness breath but states this is a chronic issue times months and denies any acute worsening. Denies any chest pain. Denies any abdominal pain. Patient states occasional nausea but again unchanged from baseline. Patient did start a stronger chemotherapy 4 days ago. Denies any fever.  Past Medical History:  Diagnosis Date  . Anemia 01/21/2015  . 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 Anxiety   . GERD (gastroesophageal reflux disease)   . Hypercholesteremia   . Hypertension   . Last menstrual period (LMP) > 10 days ago 2007  . Lung cancer ([place])   . Metastatic lung cancer (metastasis from lung to other site) [facility]) 01/21/2015    Patient Active Problem List   Diagnosis Date Noted  . Chest pain 05/16/2016  . Metastatic lung cancer (metastasis from lung to other site) ([place]) 01/21/2015  . 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 Infection with methicillin-resistant Staphylococcus aureus 01/13/2015    Past Surgical History:  Procedure Laterality Date  . ABDOMINAL HYSTERECTOMY     partial  . INSERTION / PLACEMENT PLEURAL CATHETER    . PORTACATH PLACEMENT Right 04/26/2015   Procedure: INSERTION PORT-A-CATH;  Surgeon: Nestor Lewandowsky, MD;  Location: [facility] ORS;  Service: General;  Laterality: Right;    Prior to Admission medications    Medication Sig Start Date End Date Taking? Authorizing Provider  ALPRAZolam (XANAX) 1 MG tablet TAKE 1/2 BY MOUTH 1 TO 2 TIMES DAILY AS NEEDED FOR ANXIETY AND TAKE 1 BY MOUTH AT NIGHT FOR ANXIETY/SLEEP 09/12/16   Lloyd Huger, MD  docusate sodium (COLACE) 100 MG capsule Take 100 mg by mouth 2 (two) times daily as needed for mild constipation.    Historical Provider, MD  furosemide (LASIX) 20 MG tablet Take 1 tablet (20 mg total) by mouth daily. 06/21/16   Lloyd Huger, MD  gabapentin (NEURONTIN) 300 MG capsule Take 1 capsule (300 mg total) by mouth 3 (three) times daily. 05/16/16   Lloyd Huger, MD  lidocaine-prilocaine (EMLA) cream Apply cream 1 hour before chemotherapy treatment 10/19/15   Lloyd Huger, MD  lisinopril-hydrochlorothiazide (PRINZIDE,ZESTORETIC) 10-12.5 MG per tablet Take 1 tablet by mouth daily.    Historical Provider, MD  loratadine (CLARITIN) 10 MG tablet Take 10 mg by mouth daily.    Historical Provider, MD  Multiple Vitamins-Minerals (MULTIVITAMIN WITH MINERALS) tablet Take 1 tablet by mouth daily. 04/27/15   Leia Alf, MD  omeprazole (PRILOSEC) 20 MG capsule Take 1 capsule (20 mg total) by mouth daily. 05/17/16   Lytle Butte, MD  oxyCODONE (OXY IR/ROXICODONE) 5 MG immediate release tablet Take 1 tablet (5 mg total) by mouth 3 (three) times daily as needed for severe pain. 09/19/16   Lloyd Huger, MD  potassium chloride SA (K-DUR,KLOR-CON) 20 MEQ tablet Take 1 tablet (20 mEq total) by mouth daily. 05/09/16  Lloyd Huger, MD  promethazine (PHENERGAN) 25 MG tablet Take 1 tablet (25 mg total) by mouth every 6 (six) hours as needed for nausea or vomiting. 08/09/16   Cammie Sickle, MD  simvastatin (ZOCOR) 20 MG tablet Take 20 mg by mouth at bedtime.     Historical Provider, MD  vitamin B-12 (CYANOCOBALAMIN) 1000 MCG tablet Take 1,000 mcg by mouth daily.    Historical Provider, MD    Allergies  Allergen Reactions  . Tegaderm Ag Mesh  [Silver] Other (See Comments)    blisters  . Aspirin Other (See Comments)    GIB    Family History  Problem Relation Age of Onset  . Cervical cancer Mother   . Uterine cancer Maternal Grandmother     Social History Social History  Substance Use Topics  . Smoking status: Former Smoker    Packs/day: 0.25    Years: 8.00    Types: Cigarettes    Quit date: 07/20/2014  . Smokeless tobacco: Never Used  . Alcohol use No    Review of Systems Constitutional: Negative for fever.Positive for generalized weakness/fatigue Cardiovascular: Negative for chest pain. Respiratory: Negative for shortness of breath. Gastrointestinal: Negative for abdominal pain Genitourinary: Negative for dysuria. Neurological: Negative for headaches, focal weakness or numbness. 10-point ROS otherwise negative.  ____________________________________________   PHYSICAL EXAM:  VITAL SIGNS: ED Triage Vitals [10/07/16 0809]  Enc Vitals Group     BP      Pulse Rate (!) 114     Resp      Temp 98.3 F (36.8 C)     Temp Source Oral     SpO2 96 %     Weight 163 lb (73.9 kg)     Height '5\' 5"'$  (1.651 m)     Head Circumference      Peak Flow      Pain Score 0     Pain Loc      Pain Edu?      Excl. in [place]?    Constitutional: Alert and oriented. Well appearing and in no distress. Eyes: Normal exam ENT   Head: Normocephalic and atraumatic   Mouth/Throat: Mucous membranes are moist. Cardiovascular: Regular rhythm, rate from 110 bpm. No murmur. Respiratory: Normal respiratory effort without tachypnea nor retractions. Breath sounds are clear  Gastrointestinal: Soft and nontender. No distention. Musculoskeletal: Nontender with normal range of motion in all extremities. No lower extremity tenderness or edema. Neurologic:  Normal speech and language. No gross focal neurologic deficits  Skin:  Skin is warm, dry and intact.  Psychiatric: Mood and affect are normal. Speech and behavior are normal.    ____________________________________________    EKG  EKG reviewed and interpreted by myself shows sinus tachycardia at 112 bpm. Narrow QRS, normal axis, normal intervals, no concerning ST changes noted.  ____________________________________________    RADIOLOGY  CT shows no acute abnormality. Chest x-ray shows no acute abnormality, stable.  ____________________________________________   INITIAL IMPRESSION / ASSESSMENT AND PLAN / ED COURSE  Pertinent labs & imaging results that were available during my care of the patient were reviewed by me and considered in my medical decision making (see chart for details).  Patient presents the emergency department with generalized weakness, 2 falls over the past 3 days. We will check labs, chest x-ray, CT head and closely monitor in the emergency department. Overall the patient appears well, no distress at this time.  CT and chest x-ray are negative. Patient is feeling well. She has ambulated without  difficulty. Patient will be discharged home.  ____________________________________________   FINAL CLINICAL IMPRESSION(S) / ED DIAGNOSES  Generalized weakness Cheral Marker, MD 10/07/16 1238

## 2016-10-07 NOTE — ED Triage Notes (Signed)
Pt presents to ED with c/o SHOB and cough, and CP. Per EMS CP is worse with a cough. 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 Pt presents to ED alert and oriented at this time, dry cough noted at this time. -

## 2016-10-07 NOTE — ED Notes (Signed)
NAD noted at this time. Pt resting in bed with family at bedside. Pt requesting a cup of coffee, this RN explained to patient that this RN needed to ask MD, patient states understanding at this time.

## 2016-10-08 ENCOUNTER — Telehealth: Payer: Self-pay | Admitting: Hematology and Oncology

## 2016-10-08 NOTE — Telephone Encounter (Signed)
Re:  Magic mouthwash  Patient called for refill of magic mouthwash prescribed by Dr. Grayland Ormond.  [facility] on [address] [phone]).  Last prescription was called in for magic mouthwash with lidocaine.  Spoke with pharmacy and refill provided.  Lequita Asal, MD

## 2016-10-10 ENCOUNTER — Ambulatory Visit
Admission: RE | Admit: 2016-10-10 | Discharge: 2016-10-10 | Disposition: A | Discharge status: Home / Self Care | Payer: BLUE CROSS/BLUE SHIELD | Source: Ambulatory Visit | Attending: Oncology | Admitting: Oncology

## 2016-10-10 ENCOUNTER — Other Ambulatory Visit: Payer: Self-pay | Admitting: Oncology

## 2016-10-10 ENCOUNTER — Telehealth: Payer: Self-pay | Admitting: *Deleted

## 2016-10-10 ENCOUNTER — Other Ambulatory Visit: Payer: Self-pay | Admitting: *Deleted

## 2016-10-10 DIAGNOSIS — C3491 Malignant neoplasm of unspecified part of right bronchus or lung: Secondary | ICD-10-CM | POA: Insufficient documentation

## 2016-10-10 DIAGNOSIS — R0602 Shortness of breath: Secondary | ICD-10-CM

## 2016-10-10 DIAGNOSIS — C7800 Secondary malignant neoplasm of unspecified lung: Secondary | ICD-10-CM | POA: Diagnosis not present

## 2016-10-10 DIAGNOSIS — I700 Atherosclerosis of aorta: Secondary | ICD-10-CM | POA: Insufficient documentation

## 2016-10-10 DIAGNOSIS — J90 Pleural effusion, not elsewhere classified: Secondary | ICD-10-CM | POA: Diagnosis not present

## 2016-10-10 MED ORDER — IOPAMIDOL (ISOVUE-370) INJECTION 76%
75.0000 mL | Freq: Once | INTRAVENOUS | Status: AC | PRN
  Administered 2016-10-10: 75 mL via INTRAVENOUS

## 2016-10-10 NOTE — Telephone Encounter (Signed)
Message sent to scheduling. Patient informed and in agreement

## 2016-10-10 NOTE — Telephone Encounter (Signed)
Insurance denied CT being performed, Per VO Dr Grayland Ormond, patient advised to go to ER

## 2016-10-10 NOTE — Telephone Encounter (Signed)
Per VO Dr Grayland Ormond get a PE protocol CT

## 2016-10-10 NOTE — Telephone Encounter (Signed)
Called asking for respiratory therapist. Called EMS Saturday. Having difficulty walking and breathing. Having SOB when speaking on the phone. States she has difficulty walking 7 steps to the bathroom due to weakness and shortness of breath. Please advise

## 2016-10-11 NOTE — Discharge Instructions (Signed)
Thoracentesis, Care After Refer to this sheet in the next few weeks. These instructions provide you with information about caring for yourself after your procedure. Your health care provider may also give you more specific instructions. Your treatment has been planned according to current medical practices, but problems sometimes occur. Call your health care provider if you have any problems or questions after your procedure. What can I expect after the procedure? After your procedure, it is common to have pain at the puncture site. Follow these instructions at home:  Take medicines only as directed by your health care provider.  You may return to your normal diet and normal activities as directed by your health care provider.  Drink enough fluid to keep your urine clear or pale yellow.  Do not take baths, swim, or use a hot tub until your health care provider approves.  Follow your health care provider's instructions about:  Puncture site care.  Bandage (dressing) changes and removal.  Check your puncture site every day for signs of infection. Watch for:  Redness, swelling, or pain.  Fluid, blood, or pus.  Keep all follow-up visits as directed by your health care provider. This is important. Contact a health care provider if:  You have redness, swelling, or pain at your puncture site.  You have fluid, blood, or pus coming from your puncture site.  You have a fever.  You have chills.  You have nausea or vomiting.  You have trouble breathing.  You develop a worsening cough. Get help right away if:  You have extreme shortness of breath.  You develop chest pain.  You faint or feel light-headed. This information is not intended to replace advice given to you by your health care provider. Make sure you discuss any questions you have with your health care provider. Document Released: 09/25/2014 Document Revised: 05/06/2016 Document Reviewed: 06/16/2014 Elsevier  Interactive Patient Education  2017 [employer].

## 2016-10-13 ENCOUNTER — Ambulatory Visit
Admission: RE | Admit: 2016-10-13 | Discharge: 2016-10-13 | Disposition: A | Discharge status: Home / Self Care | Payer: BLUE CROSS/BLUE SHIELD | Source: Ambulatory Visit | Attending: Oncology | Admitting: Oncology

## 2016-10-13 ENCOUNTER — Ambulatory Visit
Admission: RE | Admit: 2016-10-13 | Discharge: 2016-10-13 | Disposition: A | Discharge status: Home / Self Care | Payer: BLUE CROSS/BLUE SHIELD | Source: Ambulatory Visit | Attending: Interventional Radiology | Admitting: Interventional Radiology

## 2016-10-13 DIAGNOSIS — R0602 Shortness of breath: Secondary | ICD-10-CM | POA: Insufficient documentation

## 2016-10-13 DIAGNOSIS — C3491 Malignant neoplasm of unspecified part of right bronchus or lung: Secondary | ICD-10-CM | POA: Insufficient documentation

## 2016-10-13 DIAGNOSIS — J90 Pleural effusion, not elsewhere classified: Secondary | ICD-10-CM | POA: Diagnosis not present

## 2016-10-13 NOTE — Procedures (Signed)
[place] thora LEFT No complication No blood loss. See complete dictation in [facility].

## 2016-10-18 ENCOUNTER — Ambulatory Visit
Admission: RE | Admit: 2016-10-18 | Discharge: 2016-10-18 | Disposition: A | Discharge status: Home / Self Care | Payer: BLUE CROSS/BLUE SHIELD | Source: Ambulatory Visit | Attending: Oncology | Admitting: Oncology

## 2016-10-18 DIAGNOSIS — I708 Atherosclerosis of other arteries: Secondary | ICD-10-CM | POA: Diagnosis not present

## 2016-10-18 DIAGNOSIS — I2510 Atherosclerotic heart disease of native coronary artery without angina pectoris: Secondary | ICD-10-CM | POA: Diagnosis not present

## 2016-10-18 DIAGNOSIS — C3491 Malignant neoplasm of unspecified part of right bronchus or lung: Secondary | ICD-10-CM | POA: Diagnosis present

## 2016-10-18 DIAGNOSIS — I700 Atherosclerosis of aorta: Secondary | ICD-10-CM | POA: Diagnosis not present

## 2016-10-18 LAB — GLUCOSE, CAPILLARY: Glucose-Capillary: 96 mg/dL (ref 65–99)

## 2016-10-18 MED ORDER — FLUDEOXYGLUCOSE F - 18 (FDG) INJECTION
12.4800 | Freq: Once | INTRAVENOUS | Status: AC | PRN
  Administered 2016-10-18: 12.48 via INTRAVENOUS

## 2016-10-23 ENCOUNTER — Other Ambulatory Visit: Payer: Self-pay | Admitting: Oncology

## 2016-10-23 ENCOUNTER — Telehealth: Payer: Self-pay | Admitting: *Deleted

## 2016-10-23 DIAGNOSIS — J90 Pleural effusion, not elsewhere classified: Secondary | ICD-10-CM

## 2016-10-23 DIAGNOSIS — C3491 Malignant neoplasm of unspecified part of right bronchus or lung: Secondary | ICD-10-CM

## 2016-10-23 DIAGNOSIS — R0602 Shortness of breath: Secondary | ICD-10-CM

## 2016-10-23 NOTE — Progress Notes (Signed)
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 She continues to have chronic shortness of breath and dyspnea on exertion and she has increased weakness and fatigue and her performance status has slightly declined. She does not complain of back pain today.  She does not complain of headache today. She denies any recent fevers. She has a good appetite. She denies any chest pain or tightness or hemoptysis. She denies any nausea, vomiting, constipation, or diarrhea. She has no urinary complaints.  Patient offers no further specific complaints today.   REVIEW OF SYSTEMS:   Review of Systems  Constitutional: Positive for malaise/fatigue. Negative for diaphoresis, fever and weight loss.  Eyes: Negative.   Respiratory: Positive for shortness of breath and wheezing. Negative for cough.   Cardiovascular: Negative for chest pain, palpitations and leg swelling.  Gastrointestinal: Negative.  Negative for abdominal pain, blood in stool, diarrhea, melena and nausea.  Genitourinary: Negative.   Musculoskeletal: Negative.  Negative for back pain.       Left arm "tingling"  Skin: Negative.   Neurological: Positive for sensory change and weakness.  Psychiatric/Behavioral: Negative.  The patient is not nervous/anxious.     As per HPI. Otherwise, a complete review of systems is negative.   PAST MEDICAL HISTORY: Past Medical History:  Diagnosis Date  . Anemia 01/21/2015  . 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 Anxiety   . GERD (gastroesophageal reflux disease)   . Hypercholesteremia   .  Hypertension   . Last menstrual period (LMP) > 10 days ago 2007  . Lung cancer ([place])   . Metastatic lung cancer (metastasis from lung to other site) [facility]) 01/21/2015    PAST SURGICAL HISTORY: Past Surgical History:  Procedure Laterality Date  . ABDOMINAL HYSTERECTOMY     partial  . INSERTION / PLACEMENT PLEURAL CATHETER    . PORTACATH PLACEMENT Right 04/26/2015   Procedure: INSERTION PORT-A-CATH;  Surgeon: Nestor Lewandowsky, MD;  Location: [facility] ORS;  Service: General;  Laterality: Right;    FAMILY HISTORY Family History  Problem Relation Age of Onset  . Cervical cancer Mother   . Uterine cancer Maternal Grandmother     GYNECOLOGIC HISTORY:  No LMP recorded. Patient has had a hysterectomy.     ADVANCED DIRECTIVES:    HEALTH MAINTENANCE: Social History  Substance Use Topics  . Smoking status: Former Smoker    Packs/day: 0.25    Years: 8.00    Types: Cigarettes    Quit date: 07/20/2014  . Smokeless tobacco: Never Used  . Alcohol use No    Allergies  Allergen Reactions  . Tegaderm Ag Mesh [Silver] Other (See Comments)    blisters  . Aspirin Other (See Comments)    GIB    Current Outpatient Prescriptions  Medication Sig Dispense Refill  . ALPRAZolam (XANAX) 1 MG tablet TAKE 1/2 BY MOUTH 1 TO 2 TIMES DAILY AS NEEDED FOR ANXIETY AND TAKE 1 BY MOUTH AT NIGHT FOR ANXIETY/SLEEP 45 tablet 0  . docusate sodium (COLACE) 100 MG capsule Take 100 mg by mouth 2 (two) times daily as needed for mild constipation.    [name]  furosemide (LASIX) 20 MG tablet Take 1 tablet (20 mg total) by mouth daily. 30 tablet 2  . gabapentin (NEURONTIN) 300 MG capsule Take 1 capsule (300 mg total) by mouth 3 (three) times daily. 270 capsule 3  . lidocaine-prilocaine (EMLA) cream Apply cream 1 hour before chemotherapy treatment 30 g 1  . lisinopril-hydrochlorothiazide (PRINZIDE,ZESTORETIC) 10-12.5 MG per tablet Take 1 tablet by mouth daily.    [name] loratadine (CLARITIN) 10 MG tablet Take 10 mg by mouth daily.    .  Multiple Vitamins-Minerals (MULTIVITAMIN WITH MINERALS) tablet Take 1 tablet by mouth daily. 30 tablet 3  . omeprazole (PRILOSEC) 20 MG capsule Take 1 capsule (20 mg total) by mouth daily. 30 capsule 0  . oxyCODONE (OXY IR/ROXICODONE) 5 MG immediate release tablet Take 1 tablet (5 mg total) by mouth 3 (three) times daily as needed for severe pain. 90 tablet 0  . potassium chloride SA (K-DUR,KLOR-CON) 20 MEQ tablet Take 1 tablet (20 mEq total) by mouth daily. 90 tablet 1  . promethazine (PHENERGAN) 25 MG tablet Take 1 tablet (25 mg total) by mouth every 6 (six) hours as needed for nausea or vomiting. 30 tablet 0  . simvastatin (ZOCOR) 20 MG tablet Take 20 mg by mouth at bedtime.     . vitamin B-12 (CYANOCOBALAMIN) 1000 MCG tablet Take 1,000 mcg by mouth daily.    [name] dexamethasone (DECADRON) 4 MG tablet Take 1 tablet (4 mg total) by mouth daily. 30 tablet 0   No current facility-administered medications for this visit.    Facility-Administered Medications Ordered in Other Visits  Medication Dose Route Frequency Provider Last Rate Last Dose  . heparin lock flush 100 unit/mL  250 Units Intracatheter PRN Evlyn Kanner, NP      . sodium chloride 0.9 % injection 10 mL  10 mL Intracatheter PRN Evlyn Kanner, NP      . sodium chloride 0.9 % injection 10 mL  10 mL Intravenous PRN Leia Alf, MD   10 mL at 04/27/15 0905  . sodium chloride 0.9 % injection 3 mL  3 mL Intracatheter PRN Evlyn Kanner, NP        OBJECTIVE: BP 93/68 (BP Location: Left Arm, Patient Position: Sitting)   Pulse (!) 111   Temp 97.5 F (36.4 C) (Tympanic)   Resp 18   Wt 165 lb 10.8 oz (75.1 kg)   SpO2 95%   BMI 27.57 kg/m    Body mass index is 27.57 kg/m.    ECOG FS:1 - Symptomatic but completely ambulatory  General: Well-developed, well-nourished, no acute distress. Eyes: Pink conjunctiva, anicteric sclera. Lungs: Clear to auscultation bilaterally. Heart: Regular rate and rhythm. No rubs, murmurs, or  gallops. Musculoskeletal: No edemal, No cyanosis, or clubbing.  Neuro: Alert, answering all questions appropriately.  Skin: No rashes or petechiae noted. Psych: Normal affect.    LAB RESULTS:  Appointment on 10/24/2016  Component Date Value Ref Range Status  . WBC 10/24/2016 13.9* 3.6 - 11.0 K/uL Final  . RBC 10/24/2016 4.37  3.80 - 5.20 MIL/uL Final  . Hemoglobin 10/24/2016 11.6* 12.0 - 16.0 g/dL Final  . HCT 10/24/2016 35.0  35.0 - 47.0 % Final  . MCV 10/24/2016 80.1  80.0 - 100.0 fL Final  . MCH 10/24/2016 26.6  26.0 - 34.0 pg Final  . MCHC 10/24/2016 33.2  32.0 - 36.0 g/dL Final  . RDW 10/24/2016 18.9* 11.5 - 14.5 % Final  . Platelets 10/24/2016 443* 150 - 440 K/uL Final  .  Neutrophils Relative % 10/24/2016 79  % Final  . Neutro Abs 10/24/2016 11.1* 1.4 - 6.5 K/uL Final  . Lymphocytes Relative 10/24/2016 12  % Final  . Lymphs Abs 10/24/2016 1.7  1.0 - 3.6 K/uL Final  . Monocytes Relative 10/24/2016 7  % Final  . Monocytes Absolute 10/24/2016 1.0* 0.2 - 0.9 K/uL Final  . Eosinophils Relative 10/24/2016 1  % Final  . Eosinophils Absolute 10/24/2016 0.1  0 - 0.7 K/uL Final  . Basophils Relative 10/24/2016 1  % Final  . Basophils Absolute 10/24/2016 0.1  0 - 0.1 K/uL Final  . Sodium 10/24/2016 134* 135 - 145 mmol/L Final  . Potassium 10/24/2016 3.4* 3.5 - 5.1 mmol/L Final  . Chloride 10/24/2016 103  101 - 111 mmol/L Final  . CO2 10/24/2016 26  22 - 32 mmol/L Final  . Glucose, Bld 10/24/2016 122* 65 - 99 mg/dL Final  . BUN 10/24/2016 18  6 - 20 mg/dL Final  . Creatinine, Ser 10/24/2016 0.51  0.44 - 1.00 mg/dL Final  . Calcium 10/24/2016 8.5* 8.9 - 10.3 mg/dL Final  . Total Protein 10/24/2016 5.9* 6.5 - 8.1 g/dL Final  . Albumin 10/24/2016 2.8* 3.5 - 5.0 g/dL Final  . AST 10/24/2016 18  15 - 41 U/L Final  . ALT 10/24/2016 11* 14 - 54 U/L Final  . Alkaline Phosphatase 10/24/2016 54  38 - 126 U/L Final  . Total Bilirubin 10/24/2016 0.6  0.3 - 1.2 mg/dL Final  . GFR calc non  Af Amer 10/24/2016 >60  >60 mL/min Final  . GFR calc Af Amer 10/24/2016 >60  >60 mL/min Final   Comment: (NOTE) The eGFR has been calculated using the CKD EPI equation. This calculation has not been validated in all clinical situations. eGFR's persistently <60 mL/min signify possible Chronic Kidney Disease.   . Anion gap 10/24/2016 5  5 - 15 Final    STUDIES:  PET scan (July 07, 2016) IMPRESSION: 1. Overall interval decrease in the innumerable bilateral pulmonary nodules although lower lung volumes today air associated with increased bibasilar collapse/consolidation. 2. Stable moderate right pleural effusion with interval development of a new moderate left pleural effusion. 3. Interval resolution in hilar hypermetabolism. 4. Interval marked improvement of hypermetabolism in the C6 and L2 vertebral lesions. 5. Diffusely increased FDG uptake in the marrow spaces today presumably related to G-CSF treatment.  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 On 12/01/14 - Right Pleural Fluid Cytology. POSITIVE FOR MALIGNANT CELLS. ADENOCARCINOMA, CONSISTENT WITH LUNG ORIGIN. Molecular testing for EGFR, ALK and others is negative  PLAN:   1. 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  Patient also progressed on nivolumab. Previously patient was tested and no driver mutation was present. PET scan results from October 18, 2016 reviewed independently consistent with progressive disease. Patient had a left thoracentesis recently, but with minimal improvement of her symptoms. We will get a right-sided thoracentesis in the next week, although this fluid is more loculated and this may be unsuccessful. Patient will return to clinic in 1  week for further evaluation at which point we will likely switch her treatment to single agent Abraxane 100 mg/m on days 1 and 8 of a 21 day cycle.  2. Headaches: MRI the brain completed on  March 03, 2016 is essentially unchanged from previous.  3.  Pain (back, hip, arm): MRI and bone scan results reviewed independently revealing metastatic disease in C6 there is likely causing her symptoms. Lumbar MRI revealed an enhancing lesion and L2 as well as a suspicious 12 mm lesion and right sacrum. Patient has completed XRT. Continue gabapentin 300 mg 3 times per day. Continue 10 mg oxycodone as needed.  4.  Kidney lesion: Unclear if metastatic deposit or second primary of the left kidney. Continue to monitor closely.  5.  Anxiety: Resolved. Continue Xanax 1 mg at night and 0.5 mg as needed during the day.     6.  Hypokalemia: Patient has been instructed to continue oral potassium supplementation.     7.  Bony lesions: Consider Zometa in the future.     8.  Peripheral edema: Continue Lasix PRN. Will use with caution given patient's history of dehydration.     9.  Shortness of breath/dyspnea exertion: Left sided thoracentesis this past week. We will schedule right-sided thoracentesis in the next week.   Patient understands she can return to clinic at any time if she has any questions, concerns, or complaints.   Lloyd Huger, MD 10/30/16 8:32 AM

## 2016-10-23 NOTE — Telephone Encounter (Signed)
Asking if she can get thoracentesis on right side tomorrow. She states she is having difficulty breathing again and can tell this is how the left side started and felt. She has an appt tomorrow morning at 930 for md, lab / treatment

## 2016-10-23 NOTE — Telephone Encounter (Signed)
Per VO Dr Grayland Ormond, CXR prior to appt in [place]. Patient informed and [facility] when she gets here tomorrow

## 2016-10-24 ENCOUNTER — Inpatient Hospital Stay: Payer: BLUE CROSS/BLUE SHIELD | Attending: Oncology

## 2016-10-24 ENCOUNTER — Ambulatory Visit
Admission: RE | Admit: 2016-10-24 | Discharge: 2016-10-24 | Disposition: A | Discharge status: Home / Self Care | Payer: BLUE CROSS/BLUE SHIELD | Source: Ambulatory Visit | Attending: Oncology | Admitting: Oncology

## 2016-10-24 ENCOUNTER — Inpatient Hospital Stay: Payer: BLUE CROSS/BLUE SHIELD

## 2016-10-24 ENCOUNTER — Inpatient Hospital Stay (HOSPITAL_BASED_OUTPATIENT_CLINIC_OR_DEPARTMENT_OTHER): Payer: BLUE CROSS/BLUE SHIELD | Admitting: Oncology

## 2016-10-24 VITALS — BP 93/68 | HR 111 | Temp 97.5°F | Resp 18 | Wt 165.7 lb

## 2016-10-24 DIAGNOSIS — I10 Essential (primary) hypertension: Secondary | ICD-10-CM | POA: Insufficient documentation

## 2016-10-24 DIAGNOSIS — Z923 Personal history of irradiation: Secondary | ICD-10-CM | POA: Diagnosis not present

## 2016-10-24 DIAGNOSIS — R062 Wheezing: Secondary | ICD-10-CM | POA: Insufficient documentation

## 2016-10-24 DIAGNOSIS — Z87891 Personal history of nicotine dependence: Secondary | ICD-10-CM | POA: Insufficient documentation

## 2016-10-24 DIAGNOSIS — R0602 Shortness of breath: Secondary | ICD-10-CM | POA: Insufficient documentation

## 2016-10-24 DIAGNOSIS — R600 Localized edema: Secondary | ICD-10-CM | POA: Insufficient documentation

## 2016-10-24 DIAGNOSIS — R531 Weakness: Secondary | ICD-10-CM | POA: Diagnosis not present

## 2016-10-24 DIAGNOSIS — J90 Pleural effusion, not elsewhere classified: Secondary | ICD-10-CM | POA: Diagnosis present

## 2016-10-24 DIAGNOSIS — E876 Hypokalemia: Secondary | ICD-10-CM | POA: Insufficient documentation

## 2016-10-24 DIAGNOSIS — Z5111 Encounter for antineoplastic chemotherapy: Secondary | ICD-10-CM | POA: Insufficient documentation

## 2016-10-24 DIAGNOSIS — R5383 Other fatigue: Secondary | ICD-10-CM | POA: Insufficient documentation

## 2016-10-24 DIAGNOSIS — R5381 Other malaise: Secondary | ICD-10-CM | POA: Insufficient documentation

## 2016-10-24 DIAGNOSIS — I700 Atherosclerosis of aorta: Secondary | ICD-10-CM | POA: Insufficient documentation

## 2016-10-24 DIAGNOSIS — R918 Other nonspecific abnormal finding of lung field: Secondary | ICD-10-CM | POA: Diagnosis not present

## 2016-10-24 DIAGNOSIS — C7951 Secondary malignant neoplasm of bone: Secondary | ICD-10-CM | POA: Diagnosis not present

## 2016-10-24 DIAGNOSIS — C3491 Malignant neoplasm of unspecified part of right bronchus or lung: Secondary | ICD-10-CM | POA: Diagnosis not present

## 2016-10-24 DIAGNOSIS — Z79899 Other long term (current) drug therapy: Secondary | ICD-10-CM

## 2016-10-24 DIAGNOSIS — K219 Gastro-esophageal reflux disease without esophagitis: Secondary | ICD-10-CM

## 2016-10-24 DIAGNOSIS — C7802 Secondary malignant neoplasm of left lung: Secondary | ICD-10-CM

## 2016-10-24 DIAGNOSIS — E7800 Pure hypercholesterolemia, unspecified: Secondary | ICD-10-CM | POA: Insufficient documentation

## 2016-10-24 DIAGNOSIS — R202 Paresthesia of skin: Secondary | ICD-10-CM | POA: Insufficient documentation

## 2016-10-24 DIAGNOSIS — N289 Disorder of kidney and ureter, unspecified: Secondary | ICD-10-CM | POA: Diagnosis not present

## 2016-10-24 DIAGNOSIS — J910 Malignant pleural effusion: Secondary | ICD-10-CM

## 2016-10-24 DIAGNOSIS — Z8049 Family history of malignant neoplasm of other genital organs: Secondary | ICD-10-CM | POA: Diagnosis not present

## 2016-10-24 DIAGNOSIS — C801 Malignant (primary) neoplasm, unspecified: Secondary | ICD-10-CM

## 2016-10-24 DIAGNOSIS — F419 Anxiety disorder, unspecified: Secondary | ICD-10-CM | POA: Diagnosis not present

## 2016-10-24 LAB — CBC WITH DIFFERENTIAL/PLATELET
BASOS ABS: 0.1 10*3/uL (ref 0–0.1)
BASOS PCT: 1 %
EOS ABS: 0.1 10*3/uL (ref 0–0.7)
Eosinophils Relative: 1 %
HCT: 35 % (ref 35.0–47.0)
Hemoglobin: 11.6 g/dL — ABNORMAL LOW (ref 12.0–16.0)
Lymphocytes Relative: 12 %
Lymphs Abs: 1.7 10*3/uL (ref 1.0–3.6)
MCH: 26.6 pg (ref 26.0–34.0)
MCHC: 33.2 g/dL (ref 32.0–36.0)
MCV: 80.1 fL (ref 80.0–100.0)
MONO ABS: 1 10*3/uL — AB (ref 0.2–0.9)
MONOS PCT: 7 %
NEUTROS PCT: 79 %
Neutro Abs: 11.1 10*3/uL — ABNORMAL HIGH (ref 1.4–6.5)
Platelets: 443 10*3/uL — ABNORMAL HIGH (ref 150–440)
RBC: 4.37 MIL/uL (ref 3.80–5.20)
RDW: 18.9 % — AB (ref 11.5–14.5)
WBC: 13.9 10*3/uL — ABNORMAL HIGH (ref 3.6–11.0)

## 2016-10-24 LAB — COMPREHENSIVE METABOLIC PANEL
ALBUMIN: 2.8 g/dL — AB (ref 3.5–5.0)
ALT: 11 U/L — ABNORMAL LOW (ref 14–54)
ANION GAP: 5 (ref 5–15)
AST: 18 U/L (ref 15–41)
Alkaline Phosphatase: 54 U/L (ref 38–126)
BUN: 18 mg/dL (ref 6–20)
CHLORIDE: 103 mmol/L (ref 101–111)
CO2: 26 mmol/L (ref 22–32)
Calcium: 8.5 mg/dL — ABNORMAL LOW (ref 8.9–10.3)
Creatinine, Ser: 0.51 mg/dL (ref 0.44–1.00)
GFR calc Af Amer: >60 mL/min (ref >60)
GFR calc non Af Amer: >60 mL/min (ref >60)
GLUCOSE: 122 mg/dL — AB (ref 65–99)
POTASSIUM: 3.4 mmol/L — AB (ref 3.5–5.1)
SODIUM: 134 mmol/L — AB (ref 135–145)
Total Bilirubin: 0.6 mg/dL (ref 0.3–1.2)
Total Protein: 5.9 g/dL — ABNORMAL LOW (ref 6.5–8.1)

## 2016-10-24 MED ORDER — HEPARIN SOD (PORK) LOCK FLUSH 100 UNIT/ML IV SOLN
500.0000 [IU] | Freq: Once | INTRAVENOUS | Status: AC
  Administered 2016-10-24: 500 [IU] via INTRAVENOUS
  Filled 2016-10-24: qty 5

## 2016-10-24 MED ORDER — DEXAMETHASONE 4 MG PO TABS: 4.0000 mg | ORAL_TABLET | Freq: Every day | ORAL | 0 refills | Status: DC

## 2016-10-24 MED ORDER — SODIUM CHLORIDE 0.9% FLUSH
10.0000 mL | INTRAVENOUS | Status: DC | PRN
Start: 2016-10-24 — End: 2016-10-24
  Administered 2016-10-24: 10 mL via INTRAVENOUS
  Filled 2016-10-24: qty 10

## 2016-10-24 NOTE — Progress Notes (Signed)
Patient is here today for follow up, she is not doing so well, she is not eating like she should according to her husband. She has SOB, she is feeling very weak.

## 2016-10-26 ENCOUNTER — Ambulatory Visit
Admission: RE | Admit: 2016-10-26 | Discharge: 2016-10-26 | Disposition: A | Discharge status: Home / Self Care | Payer: BLUE CROSS/BLUE SHIELD | Source: Ambulatory Visit | Attending: Oncology | Admitting: Oncology

## 2016-10-26 ENCOUNTER — Ambulatory Visit
Admission: RE | Admit: 2016-10-26 | Discharge: 2016-10-26 | Disposition: A | Discharge status: Home / Self Care | Payer: BLUE CROSS/BLUE SHIELD | Source: Ambulatory Visit | Attending: Interventional Radiology | Admitting: Interventional Radiology

## 2016-10-26 DIAGNOSIS — J90 Pleural effusion, not elsewhere classified: Secondary | ICD-10-CM | POA: Diagnosis present

## 2016-10-26 DIAGNOSIS — Z9889 Other specified postprocedural states: Secondary | ICD-10-CM | POA: Diagnosis present

## 2016-10-26 DIAGNOSIS — C3491 Malignant neoplasm of unspecified part of right bronchus or lung: Secondary | ICD-10-CM | POA: Diagnosis not present

## 2016-10-30 ENCOUNTER — Inpatient Hospital Stay: Payer: BLUE CROSS/BLUE SHIELD

## 2016-10-30 ENCOUNTER — Other Ambulatory Visit: Payer: Self-pay | Admitting: Oncology

## 2016-10-30 ENCOUNTER — Inpatient Hospital Stay (HOSPITAL_BASED_OUTPATIENT_CLINIC_OR_DEPARTMENT_OTHER): Payer: BLUE CROSS/BLUE SHIELD | Admitting: Oncology

## 2016-10-30 VITALS — BP 120/82 | HR 103 | Temp 97.2°F | Resp 18 | Wt 163.0 lb

## 2016-10-30 DIAGNOSIS — R0602 Shortness of breath: Secondary | ICD-10-CM

## 2016-10-30 DIAGNOSIS — Z87891 Personal history of nicotine dependence: Secondary | ICD-10-CM

## 2016-10-30 DIAGNOSIS — C7802 Secondary malignant neoplasm of left lung: Secondary | ICD-10-CM | POA: Diagnosis not present

## 2016-10-30 DIAGNOSIS — C3491 Malignant neoplasm of unspecified part of right bronchus or lung: Secondary | ICD-10-CM

## 2016-10-30 DIAGNOSIS — I10 Essential (primary) hypertension: Secondary | ICD-10-CM

## 2016-10-30 DIAGNOSIS — E876 Hypokalemia: Secondary | ICD-10-CM

## 2016-10-30 DIAGNOSIS — K219 Gastro-esophageal reflux disease without esophagitis: Secondary | ICD-10-CM

## 2016-10-30 DIAGNOSIS — Z923 Personal history of irradiation: Secondary | ICD-10-CM

## 2016-10-30 DIAGNOSIS — C7951 Secondary malignant neoplasm of bone: Secondary | ICD-10-CM | POA: Diagnosis not present

## 2016-10-30 DIAGNOSIS — R202 Paresthesia of skin: Secondary | ICD-10-CM

## 2016-10-30 DIAGNOSIS — Z79899 Other long term (current) drug therapy: Secondary | ICD-10-CM

## 2016-10-30 DIAGNOSIS — J910 Malignant pleural effusion: Secondary | ICD-10-CM

## 2016-10-30 DIAGNOSIS — N289 Disorder of kidney and ureter, unspecified: Secondary | ICD-10-CM

## 2016-10-30 DIAGNOSIS — E7800 Pure hypercholesterolemia, unspecified: Secondary | ICD-10-CM

## 2016-10-30 DIAGNOSIS — R600 Localized edema: Secondary | ICD-10-CM

## 2016-10-30 DIAGNOSIS — F419 Anxiety disorder, unspecified: Secondary | ICD-10-CM

## 2016-10-30 LAB — CBC WITH DIFFERENTIAL/PLATELET
Basophils Absolute: 0.2 10*3/uL — ABNORMAL HIGH (ref 0–0.1)
Basophils Relative: 1 %
EOS ABS: 0.7 10*3/uL (ref 0–0.7)
EOS PCT: 3 %
HCT: 35.5 % (ref 35.0–47.0)
Hemoglobin: 11.9 g/dL — ABNORMAL LOW (ref 12.0–16.0)
LYMPHS ABS: 1.6 10*3/uL (ref 1.0–3.6)
LYMPHS PCT: 8 %
MCH: 26.7 pg (ref 26.0–34.0)
MCHC: 33.4 g/dL (ref 32.0–36.0)
MCV: 79.8 fL — AB (ref 80.0–100.0)
MONO ABS: 1.2 10*3/uL — AB (ref 0.2–0.9)
Monocytes Relative: 6 %
Neutro Abs: 16.2 10*3/uL — ABNORMAL HIGH (ref 1.4–6.5)
Neutrophils Relative %: 82 %
PLATELETS: 419 10*3/uL (ref 150–440)
RBC: 4.45 MIL/uL (ref 3.80–5.20)
RDW: 18.9 % — AB (ref 11.5–14.5)
WBC: 19.9 10*3/uL — ABNORMAL HIGH (ref 3.6–11.0)

## 2016-10-30 LAB — COMPREHENSIVE METABOLIC PANEL
ALT: 20 U/L (ref 14–54)
ANION GAP: 4 — AB (ref 5–15)
AST: 17 U/L (ref 15–41)
Albumin: 2.9 g/dL — ABNORMAL LOW (ref 3.5–5.0)
Alkaline Phosphatase: 46 U/L (ref 38–126)
BUN: 19 mg/dL (ref 6–20)
CHLORIDE: 104 mmol/L (ref 101–111)
CO2: 29 mmol/L (ref 22–32)
CREATININE: 0.45 mg/dL (ref 0.44–1.00)
Calcium: 8.6 mg/dL — ABNORMAL LOW (ref 8.9–10.3)
Glucose, Bld: 104 mg/dL — ABNORMAL HIGH (ref 65–99)
POTASSIUM: 3.4 mmol/L — AB (ref 3.5–5.1)
SODIUM: 137 mmol/L (ref 135–145)
Total Bilirubin: 0.6 mg/dL (ref 0.3–1.2)
Total Protein: 6 g/dL — ABNORMAL LOW (ref 6.5–8.1)

## 2016-10-30 MED ORDER — SODIUM CHLORIDE 0.9 % IV SOLN
Freq: Once | INTRAVENOUS | Status: AC
  Administered 2016-10-30: 11:00:00 via INTRAVENOUS
  Filled 2016-10-30: qty 1000

## 2016-10-30 MED ORDER — HEPARIN SOD (PORK) LOCK FLUSH 100 UNIT/ML IV SOLN
INTRAVENOUS | Status: AC
  Filled 2016-10-30: qty 5

## 2016-10-30 MED ORDER — PROCHLORPERAZINE MALEATE 10 MG PO TABS
10.0000 mg | ORAL_TABLET | Freq: Once | ORAL | Status: AC
  Administered 2016-10-30: 10 mg via ORAL
  Filled 2016-10-30: qty 1

## 2016-10-30 MED ORDER — HEPARIN SOD (PORK) LOCK FLUSH 100 UNIT/ML IV SOLN
500.0000 [IU] | Freq: Once | INTRAVENOUS | Status: AC | PRN
  Administered 2016-10-30: 500 [IU]
  Filled 2016-10-30: qty 5

## 2016-10-30 MED ORDER — PACLITAXEL PROTEIN-BOUND CHEMO INJECTION 100 MG
100.0000 mg/m2 | Freq: Once | INTRAVENOUS | Status: AC
  Administered 2016-10-30: 175 mg via INTRAVENOUS
  Filled 2016-10-30: qty 35

## 2016-10-30 NOTE — Progress Notes (Signed)
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  INTERVAL HISTORY: Patient returns to clinic today for further evaluation, discussion of her imaging results, and continuation of treatment. Since initiating dexamethasone last week, her weakness and fatigue have significantly improved. She continues to have dyspnea on exertion, but this as well as her shortness of breath have improved. She does not complain of back pain today.  She does not complain of headache today. She denies any recent fevers. She has a good appetite. She denies any chest pain or tightness or hemoptysis. She denies any nausea, vomiting, constipation, or diarrhea. She has no urinary complaints.  Patient offers no further specific complaints today.  REVIEW OF SYSTEMS:   Review of Systems  Constitutional: Negative.  Negative for fever, malaise/fatigue and weight loss.  Respiratory: Positive for shortness of breath. Negative for cough and hemoptysis.   Cardiovascular: Negative.  Negative for chest pain and leg swelling.  Gastrointestinal: Negative.  Negative for abdominal pain.  Genitourinary: Negative.   Musculoskeletal: Negative.   Neurological: Negative.  Negative for weakness.  Psychiatric/Behavioral: Negative.  The patient is not nervous/anxious.     As per HPI. Otherwise, a complete review of systems is negative.  PAST MEDICAL HISTORY: Past Medical History:  Diagnosis Date  . Anemia 01/21/2015  . 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 Anxiety   . GERD (gastroesophageal reflux disease)   . Hypercholesteremia   . Hypertension   . Last menstrual period (LMP) > 10 days ago 2007  . Lung cancer ([place])   . Metastatic lung  cancer (metastasis from lung to other site) [facility]) 01/21/2015    PAST SURGICAL HISTORY: Past Surgical History:  Procedure Laterality Date  . ABDOMINAL HYSTERECTOMY     partial  . INSERTION / PLACEMENT PLEURAL CATHETER    . PORTACATH PLACEMENT Right 04/26/2015   Procedure: INSERTION PORT-A-CATH;  Surgeon: Nestor Lewandowsky, MD;  Location: [facility] ORS;  Service: General;  Laterality: Right;    FAMILY HISTORY: Family History  Problem Relation Age of Onset  . Cervical cancer Mother   . Uterine cancer Maternal Grandmother     ADVANCED DIRECTIVES (Y/N):  N  HEALTH MAINTENANCE: Social History  Substance Use Topics  . Smoking status: Former Smoker    Packs/day: 0.25    Years: 8.00    Types: Cigarettes    Quit date: 07/20/2014  . Smokeless tobacco: Never Used  . Alcohol use No     Colonoscopy:  PAP:  Bone density:  Lipid panel:  Allergies  Allergen Reactions  . Tegaderm Ag Mesh [Silver] Other (See Comments)    blisters  . Aspirin Other (See Comments)    GIB    Current Outpatient Prescriptions  Medication Sig Dispense Refill  . ALPRAZolam (XANAX) 1 MG tablet TAKE 1/2 BY MOUTH 1 TO 2 TIMES DAILY AS NEEDED FOR ANXIETY AND TAKE 1 BY MOUTH AT NIGHT FOR ANXIETY/SLEEP 45 tablet 0  . dexamethasone (DECADRON) 4 MG tablet Take 1 tablet (4 mg total) by mouth daily. 30 tablet 0  . docusate sodium (COLACE) 100 MG capsule Take 100 mg by mouth 2 (two) times daily as needed for mild constipation.    . furosemide (LASIX) 20 MG tablet Take 1 tablet (20 mg total) by  mouth daily. 30 tablet 2  . gabapentin (NEURONTIN) 300 MG capsule Take 1 capsule (300 mg total) by mouth 3 (three) times daily. 270 capsule 3  . lidocaine-prilocaine (EMLA) cream Apply cream 1 hour before chemotherapy treatment 30 g 1  . lisinopril-hydrochlorothiazide (PRINZIDE,ZESTORETIC) 10-12.5 MG per tablet Take 1 tablet by mouth daily.    [name] loratadine (CLARITIN) 10 MG tablet Take 10 mg by mouth daily.    . Multiple Vitamins-Minerals  (MULTIVITAMIN WITH MINERALS) tablet Take 1 tablet by mouth daily. 30 tablet 3  . omeprazole (PRILOSEC) 20 MG capsule Take 1 capsule (20 mg total) by mouth daily. 30 capsule 0  . oxyCODONE (OXY IR/ROXICODONE) 5 MG immediate release tablet Take 1 tablet (5 mg total) by mouth 3 (three) times daily as needed for severe pain. 90 tablet 0  . potassium chloride SA (K-DUR,KLOR-CON) 20 MEQ tablet Take 1 tablet (20 mEq total) by mouth daily. 90 tablet 1  . promethazine (PHENERGAN) 25 MG tablet Take 1 tablet (25 mg total) by mouth every 6 (six) hours as needed for nausea or vomiting. 30 tablet 0  . simvastatin (ZOCOR) 20 MG tablet Take 20 mg by mouth at bedtime.     . vitamin B-12 (CYANOCOBALAMIN) 1000 MCG tablet Take 1,000 mcg by mouth daily.     No current facility-administered medications for this visit.    Facility-Administered Medications Ordered in Other Visits  Medication Dose Route Frequency Provider Last Rate Last Dose  . heparin lock flush 100 unit/mL  250 Units Intracatheter PRN Evlyn Kanner, NP      . sodium chloride 0.9 % injection 10 mL  10 mL Intracatheter PRN Evlyn Kanner, NP      . sodium chloride 0.9 % injection 10 mL  10 mL Intravenous PRN Leia Alf, MD   10 mL at 04/27/15 0905  . sodium chloride 0.9 % injection 3 mL  3 mL Intracatheter PRN Evlyn Kanner, NP        OBJECTIVE: Vitals:   10/30/16 0927  BP: 120/82  Pulse: (!) 103  Resp: 18  Temp: 97.2 F (36.2 C)     Body mass index is 27.13 kg/m.    ECOG FS:1 - Symptomatic but completely ambulatory  General: Well-developed, well-nourished, no acute distress. Eyes: Pink conjunctiva, anicteric sclera. Lungs: Diminished breath nds bilaterally. Heart: Regular rate and rhythm. No rubs, murmurs, or gallops. Abdomen: Soft, nontender, nondistended. No organomegaly noted, normoactive bowel sounds. Musculoskeletal: No edema, cyanosis, or clubbing. Neuro: Alert, answering all questions appropriately. Cranial nerves  grossly intact. Skin: No rashes or petechiae noted. Psych: Normal affect.   LAB RESULTS:  Lab Results  Component Value Date   NA 137 10/30/2016   K 3.4 (L) 10/30/2016   CL 104 10/30/2016   CO2 29 10/30/2016   GLUCOSE 104 (H) 10/30/2016   BUN 19 10/30/2016   CREATININE 0.45 10/30/2016   CALCIUM 8.6 (L) 10/30/2016   PROT 6.0 (L) 10/30/2016   ALBUMIN 2.9 (L) 10/30/2016   AST 17 10/30/2016   ALT 20 10/30/2016   ALKPHOS 46 10/30/2016   BILITOT 0.6 10/30/2016   GFRNONAA >60 10/30/2016   GFRAA >60 10/30/2016    Lab Results  Component Value Date   WBC 19.9 (H) 10/30/2016   NEUTROABS 16.2 (H) 10/30/2016   HGB 11.9 (L) 10/30/2016   HCT 35.5 10/30/2016   MCV 79.8 (L) 10/30/2016   PLT 419 10/30/2016     STUDIES: Dg Chest 1 View  Result Date: 10/26/2016 CLINICAL DATA:  Status post attempted right thoracentesis. EXAM: CHEST 1 VIEW COMPARISON:  10/24/2016 FINDINGS: No significant change in appearance of rind of density in the right pleural space. No pneumothorax. Underlying significant airspace and interstitial opacity in the right lung is unchanged. Patchy nodularity in the left lung is unchanged. No significant reaccumulation of left pleural effusion following prior left thoracentesis. IMPRESSION: No pneumothorax or significant change in appearance of the chest after attempted right thoracentesis. Electronically Signed   By: Aletta Edouard M.D.   On: 10/26/2016 17:03   Dg Chest 1 View  Result Date: 10/13/2016 CLINICAL DATA:  S/p right side thora EXAM: CHEST  1 VIEW COMPARISON:  10/07/2016 FINDINGS: No pneumothorax. Improved aeration in the left lung base. Persistent loculated right lateral pneumothorax. Heart size and mediastinal contours are within normal limits. Atheromatous aorta. Stable right IJ port catheter to the low SVC. Visualized bones unremarkable. IMPRESSION: 1. No pneumothorax.  Improved aeration, left lung base. Electronically Signed   By: Lucrezia Europe M.D.   On:  10/13/2016 14:01   Dg Chest 2 View  Result Date: 10/24/2016 CLINICAL DATA:  Shortness of breath.  Pleural effusion. EXAM: CHEST  2 VIEW COMPARISON:  Radiograph of October 13, 2016. FINDINGS: Stable cardiomediastinal silhouette. Atherosclerosis of thoracic aorta is noted. No pneumothorax is noted. Stable loculated right pleural effusion is seen along right lateral chest wall and apex. Stable elevated left hemidiaphragm. Right internal jugular Port-A-Cath is unchanged in position. Stable interstitial densities are noted throughout both lungs which may represent scarring, but superimposed inflammation or edema cannot be excluded. Bony thorax is unremarkable. IMPRESSION: Aortic atherosclerosis. Stable loculated right pleural effusion. Stable interstitial densities throughout both lungs most consistent with scarring, although superimposed edema or inflammation cannot be excluded. Electronically Signed   By: Marijo Conception, M.D.   On: 10/24/2016 10:21   Dg Chest 2 View  Result Date: 10/07/2016 CLINICAL DATA:  Shortness breath, chest pain EXAM: CHEST  2 VIEW COMPARISON:  Chest x-ray 08/02/2016, CT chest 05/16/2016 FINDINGS: Bilateral reticulonodular interstitial disease. Loculated right pleural effusion which is similar in size compared with the prior exam of 08/02/2016. More confluent airspace disease at the right lung base likely reflecting persistent malignancy when correlated with prior CT of the chest dated 05/16/2016. No pneumothorax. Stable cardiomediastinal silhouette. No acute osseous abnormality. Right-sided Port-A-Cath in satisfactory position. IMPRESSION: 1. Persistent stable loculated right pleural effusion. 2. Stable bilateral reticulonodular interstitial disease most concerning for persistent malignancy given the patient's history. Electronically Signed   By: Kathreen Devoid   On: 10/07/2016 09:02   Ct Head Wo Contrast  Result Date: 10/07/2016 CLINICAL DATA:  Stage IV adenocarcinoma, recent falls  EXAM: CT HEAD WITHOUT CONTRAST TECHNIQUE: Contiguous axial images were obtained from the base of the skull through the vertex without intravenous contrast. COMPARISON:  MR brain 03/03/2016, 10/12/2015 FINDINGS: Brain: No evidence of acute infarction, hemorrhage, extra-axial collection, ventriculomegaly, or mass effect. Generalized cerebral atrophy. Extensive periventricular white matter low attenuation likely secondary to microangiopathy. Vascular: Cerebrovascular atherosclerotic calcifications are noted. Skull: Negative for fracture or focal lesion. Sinuses/Orbits: Visualized portions of the orbits are unremarkable. Visualized portions of the paranasal sinuses and mastoid air cells are unremarkable. Other: None. IMPRESSION: 1. No acute intracranial pathology. 2. If there is further clinical concern regarding metastatic disease, recommend evaluation with MRI of the brain without and with intravenous contrast. Electronically Signed   By: Kathreen Devoid   On: 10/07/2016 09:34   Ct Angio Chest Pe W Or Wo Contrast  Result  Date: 10/10/2016 CLINICAL DATA:  Shortness of breath, chest pain weakness for 3 weeks, history of metastatic RIGHT lung cancer 2 years ago, hypertension, GERD, former smoker EXAM: CT ANGIOGRAPHY CHEST WITH CONTRAST TECHNIQUE: Multidetector CT imaging of the chest was performed using the standard protocol during bolus administration of intravenous contrast. Multiplanar CT image reconstructions and MIPs were obtained to evaluate the vascular anatomy. CONTRAST:  75 cc Isovue 370 IV COMPARISON:  05/16/2016 FINDINGS: Cardiovascular: Atherosclerotic calcifications aorta without aneurysm or dissection. No pericardial effusion. Pulmonary arteries well opacified and patent. No evidence of pulmonary embolism. Mediastinum/Nodes: Normal sized axillary lymph nodes bilaterally. No definite mediastinal or hilar adenopathy. Esophagus unremarkable. Base of cervical region normal appearance Lungs/Pleura: Moderate to  large LEFT pleural effusion, increased. Moderate RIGHT pleural effusion, partially loculated. BILATERAL pulmonary nodules compatible with metastatic disease,decreased in sizes and number since the previous exam. LEFT upper lobe nodule 5 mm transverse image 40 previously 9 mm. 4 mm LEFT lower lobe nodule image 47 previously 13 mm. 6 mm LEFT lower lobe nodule image 47 previously 15 mm. Chronic areas of peribronchial thickening and RIGHT perihilar infiltrate remain. Scattered interseptal thickening and fissural thickening in RIGHT lung appears slightly increased. Upper Abdomen: 20 x 16 mm LEFT renal nodule image 98, intermediate attenuation, measured 18 x 15 mm on 01/07/2016. Descending colonic diverticulosis. Remaining visualized upper abdomen unremarkable. Musculoskeletal: No focal osseous lesion. Review of the MIP images confirms the above findings. IMPRESSION: No evidence of pulmonary embolism. Increase in LEFT pleural effusion since previous exam . Chronic partially loculated RIGHT pleural effusion unchanged. Decreased size and number of previously seen pulmonary metastases. Diffuse interseptal thickening and fissural thickening in the RIGHT lung appears mildly progressive. Aortic atherosclerosis. Indeterminate LEFT renal nodule 20 x 16 mm minimally increased since 01/07/2016. Electronically Signed   By: Lavonia Dana M.D.   On: 10/10/2016 15:48   Nm Pet Image Restag (ps) Skull Base To Thigh  Result Date: 10/18/2016 CLINICAL DATA:  Subsequent treatment strategy for right lung cancer. EXAM: NUCLEAR MEDICINE PET SKULL BASE TO THIGH TECHNIQUE: 12.5 mCi F-18 FDG was injected intravenously. Full-ring PET imaging was performed from the skull base to thigh after the radiotracer. CT data was obtained and used for attenuation correction and anatomic localization. FASTING BLOOD GLUCOSE:  Value: 96 mg/dl COMPARISON:  Multiple exams, including 07/07/2016 FINDINGS: NECK No hypermetabolic lymph nodes in the neck. CHEST Large  left and loculated moderate to large right pleural effusion. Scattered interstitial and airspace opacities bilaterally, right greater than left, with scattered bilateral pulmonary nodules. Posterior airspace opacities in the right lower lobe are associated with hypermetabolic activity which was not present previously, maximum SUV 9.9 (previously 4.5). There is mild hypermetabolic activity within what appears to be the right pleural space and along the right pleural effusion but not the left, suspicious for a malignant effusion. For example, faint hypermetabolic activity peripherally along the right pleural space has a maximum SUV up to 4.4. Coronary, aortic arch, and branch vessel atherosclerotic vascular disease. ABDOMEN/PELVIS No abnormal hypermetabolic activity within the liver, pancreas, adrenal glands, or spleen. No hypermetabolic lymph nodes in the abdomen or pelvis. Aortoiliac atherosclerotic vascular disease. SKELETON Faintly accentuated marrow activity diffusely. Mild sclerosis and lucency in the L2 vertebral body with very faint associated metabolic activity, not appreciably above background. IMPRESSION: 1. Hypermetabolic peripheral nodular airspace opacities in the right lung primarily along the right lower lobe and to a lesser extent peripherally along the right upper lobe, significantly increased from prior and suspicious for  malignancy. Suspected malignant right pleural effusion/pleural rind with some low-grade focal hypermetabolic activity and some generalized faint hypermetabolic activity. 2. Bilateral airway thickening with interstitial accentuation much of which is likely from edema, but also with scattered pulmonary nodules. Many of these nodules are technically too small to characterize, but not appreciably hypermetabolic. A component of edema or atypical pneumonia is certainly not excluded given this appearance. 3. Other imaging findings of potential clinical significance: Coronary, aortic arch,  and branch vessel atherosclerotic vascular disease. Aortoiliac atherosclerotic vascular disease. Electronically Signed   By: Van Clines M.D.   On: 10/18/2016 15:21   US Thoracentesis Asp Pleural Space W/img Guide  Result Date: 10/26/2016 CLINICAL DATA:  History of lung carcinoma with chest x-ray demonstrating loculated right pleural effusion. EXAM: CHEST ULTRASOUND COMPARISON:  Chest x-ray on 10/24/2016 and PET scan on 10/18/2016. PROCEDURE: Ultrasound of the chest was performed in a sitting upright position in preparation for right-sided thoracentesis. The posterior right chest wall was prepped with chlorhexidine. Local anesthesia was provided with 1% lidocaine. Under ultrasound guidance, both 6 French Safe-T-Centesis and Yueh catheters were inserted into the posterior right pleural space. Aspiration was performed. The procedure was then aborted. FINDINGS: By ultrasound, there did appear to be a rim of posterior pleural fluid in the right hemithorax. There was absolutely no return of fluid despite direct ultrasound guided visualization of both Safe-T-Centesis and Yueh catheters entering the pleural space. It is felt that there is a combination of fluid loculation and tumor that is prohibiting free fluid return. IMPRESSION: Inability to perform successful ultrasound-guided right thoracentesis despite visualization of what appeared to be a rim of posterior pleural fluid and direct ultrasound visualization of advancement of both Safe-T-Centesis and Yueh catheters into the posterior right pleural space. There likely is a combination of pleural tumor and fluid loculation/ complex fluid that is not allowing successful thoracentesis. Electronically Signed   By: Aletta Edouard M.D.   On: 10/26/2016 17:02   US Thoracentesis Asp Pleural Space W/img Guide  Result Date: 10/13/2016 CLINICAL DATA:  Metastatic right lung carcinoma. Increasing left pleural effusion. Shortness of breath. EXAM: EXAM THORACENTESIS  WITH ULTRASOUND TECHNIQUE: The procedure, risks (including but not limited to bleeding, infection, organ damage ), benefits, and alternatives were explained to the patient. Questions regarding the procedure were encouraged and answered. The patient understands and consents to the procedure. Survey ultrasound of the left hemithorax was performed and an appropriate skin entry site was localized. Site was marked, prepped with chlorhexidine, draped in usual sterile fashion, infiltrated locally with 1% lidocaine. The Saf-T-Centesis needle was advanced into the pleural space. Clear yellow fluid returned. 1.2 L was removed. Post procedure imaging shows no residual fluid. The patient tolerated procedure well. COMPLICATIONS: COMPLICATIONS None immediate IMPRESSION: Technically successful ultrasound-guided left thoracentesis. Follow-up chest radiograph shows no pneumothorax. Electronically Signed   By: Lucrezia Europe M.D.   On: 10/13/2016 13:58    ONCOLOGY HISTORY: Stage IV ([place] NX M1) right lung non-small cell lung cancer (adenocarcinoma) with malignant right pleural effusion, lymph node and left lung metastasis. On 12/01/14 - Right Pleural Fluid Cytology. POSITIVE FOR MALIGNANT CELLS. ADENOCARCINOMA, CONSISTENT WITH LUNG ORIGIN. Molecular testing for EGFR, ALK and others is negative. Patient initially was started on carboplatinum and pemetrexed and completed 6 cycles on April 06, 2015. She was then switched to maintenance pemetrexed until December 15, 2015 at which time she was noted to have progressive disease. Patient then received nivolumab from Jan 18, 2016 through March 28, 2016 and  then had progressive disease. She then received single agent Taxotere April 18, 2016 through October 03, 2016. Patient initiated single agent Abraxane on October 30, 2016.  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  PLAN:  1. Stage IV right lung non-small cell  carcinoma with malignant pleural effusion and bilateral lung metastasis:  PET scan results from October 18, 2016 reviewed independently consistent with progressive disease. Patient had a left thoracentesis recently, but with minimal improvement of her symptoms. Right sided thoracentesis was attempted, but no fluid could be obtained. Given patient's progressive disease, Taxotere has been discontinued and patient will proceed with cycle 1, day 1 of single agent Abraxane. She will receive treatment on on days 1 and 8 of a 21 day cycle. Return to clinic in 1 week for consideration of cycle 1, day 8. 2. Headaches: MRI the brain completed on March 03, 2016 is essentially unchanged from previous. 3.  Pain (back, hip, arm): MRI and bone scan results reviewed independently revealing metastatic disease in C6 there is likely causing her symptoms. Lumbar MRI revealed an enhancing lesion and L2 as well as a suspicious 12 mm lesion and right sacrum. Patient has completed XRT. Continue gabapentin 300 mg 3 times per day. Continue 10 mg oxycodone as needed.  4.  Kidney lesion: Unclear if metastatic deposit or second primary of the left kidney. Continue to monitor closely. 5.  Anxiety: Resolved. Continue Xanax 1 mg at night and 0.5 mg as needed during the day. 6.  Hypokalemia: Patient has been instructed to continue oral potassium supplementation. 7.  Bony lesions: Consider Zometa in the future. 8.  Peripheral edema: Continue Lasix PRN. Will use with caution given patient's history of dehydration. 9.  Shortness of breath/dyspnea exertion: Left sided thoracentesis this past week. Continue dexamethasone.   Patient expressed understanding and was in agreement with this plan. She also understands that She can call clinic at any time with any questions, concerns, or complaints.   Cancer Staging Metastatic lung cancer (metastasis from lung to other site) [facility] Staging form: Lung, AJCC 7th Edition - Clinical: Stage Unknown  ([place], NX, M1) - Signed by Evlyn Kanner, NP on 01/21/2015   Lloyd Huger, MD   10/31/2016 3:15 PM

## 2016-11-06 NOTE — Progress Notes (Signed)
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  INTERVAL HISTORY: Patient returns to clinic today for further evaluation and consideration of cycle 1, day 8 of Abraxane. She tolerated her treatment well without significant side effects. Her weakness and fatigue have significantly improved. She continues to have dyspnea on exertion, but this as well as her shortness of breath have improved. She does not complain of back pain today.  She does not complain of headache today. She denies any recent fevers. She has a good appetite. She denies any chest pain or tightness or hemoptysis. She denies any nausea, vomiting, constipation, or diarrhea. She has no urinary complaints.  Patient offers no further specific complaints today.  REVIEW OF SYSTEMS:   Review of Systems  Constitutional: Negative.  Negative for fever, malaise/fatigue and weight loss.  Respiratory: Positive for shortness of breath. Negative for cough and hemoptysis.   Cardiovascular: Negative.  Negative for chest pain and leg swelling.  Gastrointestinal: Negative.  Negative for abdominal pain.  Genitourinary: Negative.   Musculoskeletal: Negative.   Neurological: Negative.  Negative for weakness.  Psychiatric/Behavioral: Negative.  The patient is not nervous/anxious.     As per HPI. Otherwise, a complete review of systems is negative.  PAST MEDICAL HISTORY: Past Medical History:  Diagnosis Date  . Anemia 01/21/2015  . 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 Anxiety   . GERD (gastroesophageal reflux disease)   . Hypercholesteremia   . Hypertension   . Last menstrual period (LMP) > 10 days ago 2007  . Lung cancer ([place])   .  Metastatic lung cancer (metastasis from lung to other site) [facility]) 01/21/2015    PAST SURGICAL HISTORY: Past Surgical History:  Procedure Laterality Date  . ABDOMINAL HYSTERECTOMY     partial  . INSERTION / PLACEMENT PLEURAL CATHETER    . PORTACATH PLACEMENT Right 04/26/2015   Procedure: INSERTION PORT-A-CATH;  Surgeon: Nestor Lewandowsky, MD;  Location: [facility] ORS;  Service: General;  Laterality: Right;    FAMILY HISTORY: Family History  Problem Relation Age of Onset  . Cervical cancer Mother   . Uterine cancer Maternal Grandmother     ADVANCED DIRECTIVES (Y/N):  N  HEALTH MAINTENANCE: Social History  Substance Use Topics  . Smoking status: Former Smoker    Packs/day: 0.25    Years: 8.00    Types: Cigarettes    Quit date: 07/20/2014  . Smokeless tobacco: Never Used  . Alcohol use No     Colonoscopy:  PAP:  Bone density:  Lipid panel:  Allergies  Allergen Reactions  . Tegaderm Ag Mesh [Silver] Other (See Comments)    blisters  . Aspirin Other (See Comments)    GIB    Current Outpatient Prescriptions  Medication Sig Dispense Refill  . ALPRAZolam (XANAX) 1 MG tablet TAKE 1/2 BY MOUTH 1 TO 2 TIMES DAILY AS NEEDED FOR ANXIETY AND TAKE 1 BY MOUTH AT NIGHT FOR ANXIETY/SLEEP 45 tablet 0  . dexamethasone (DECADRON) 4 MG tablet Take 1 tablet (4 mg total) by mouth daily. 30 tablet 0  . docusate sodium (COLACE) 100 MG capsule Take 100 mg by mouth 2 (two) times daily as needed for mild constipation.    . furosemide (LASIX) 20 MG tablet Take 1 tablet (  20 mg total) by mouth daily. 30 tablet 2  . gabapentin (NEURONTIN) 300 MG capsule Take 1 capsule (300 mg total) by mouth 3 (three) times daily. 270 capsule 3  . lidocaine-prilocaine (EMLA) cream Apply cream 1 hour before chemotherapy treatment 30 g 1  . lisinopril-hydrochlorothiazide (PRINZIDE,ZESTORETIC) 10-12.5 MG per tablet Take 1 tablet by mouth daily.    [name] loratadine (CLARITIN) 10 MG tablet Take 10 mg by mouth daily.    . Multiple  Vitamins-Minerals (MULTIVITAMIN WITH MINERALS) tablet Take 1 tablet by mouth daily. 30 tablet 3  . omeprazole (PRILOSEC) 20 MG capsule Take 1 capsule (20 mg total) by mouth daily. 30 capsule 0  . oxyCODONE (OXY IR/ROXICODONE) 5 MG immediate release tablet Take 1 tablet (5 mg total) by mouth 3 (three) times daily as needed for severe pain. 90 tablet 0  . potassium chloride SA (K-DUR,KLOR-CON) 20 MEQ tablet Take 1 tablet (20 mEq total) by mouth daily. 90 tablet 1  . promethazine (PHENERGAN) 25 MG tablet Take 1 tablet (25 mg total) by mouth every 6 (six) hours as needed for nausea or vomiting. 30 tablet 0  . simvastatin (ZOCOR) 20 MG tablet Take 20 mg by mouth at bedtime.     . vitamin B-12 (CYANOCOBALAMIN) 1000 MCG tablet Take 1,000 mcg by mouth daily.     No current facility-administered medications for this visit.    Facility-Administered Medications Ordered in Other Visits  Medication Dose Route Frequency Provider Last Rate Last Dose  . heparin lock flush 100 unit/mL  250 Units Intracatheter PRN Evlyn Kanner, NP      . sodium chloride 0.9 % injection 10 mL  10 mL Intracatheter PRN Evlyn Kanner, NP      . sodium chloride 0.9 % injection 10 mL  10 mL Intravenous PRN Leia Alf, MD   10 mL at 04/27/15 0905  . sodium chloride 0.9 % injection 3 mL  3 mL Intracatheter PRN Evlyn Kanner, NP        OBJECTIVE: Vitals:   11/07/16 1139  BP: 102/70  Pulse: 100  Temp: 97.9 F (36.6 C)     Body mass index is 27.87 kg/m.    ECOG FS:1 - Symptomatic but completely ambulatory  General: Well-developed, well-nourished, no acute distress. Eyes: Pink conjunctiva, anicteric sclera. Lungs: Diminished breath nds bilaterally. Heart: Regular rate and rhythm. No rubs, murmurs, or gallops. Abdomen: Soft, nontender, nondistended. No organomegaly noted, normoactive bowel sounds. Musculoskeletal: No edema, cyanosis, or clubbing. Neuro: Alert, answering all questions appropriately. Cranial nerves  grossly intact. Skin: No rashes or petechiae noted. Psych: Normal affect.   LAB RESULTS:  Lab Results  Component Value Date   NA 136 11/07/2016   K 3.6 11/07/2016   CL 104 11/07/2016   CO2 27 11/07/2016   GLUCOSE 125 (H) 11/07/2016   BUN 23 (H) 11/07/2016   CREATININE 0.37 (L) 11/07/2016   CALCIUM 8.5 (L) 11/07/2016   PROT 6.0 (L) 11/07/2016   ALBUMIN 3.0 (L) 11/07/2016   AST 32 11/07/2016   ALT 53 11/07/2016   ALKPHOS 45 11/07/2016   BILITOT 0.7 11/07/2016   GFRNONAA >60 11/07/2016   GFRAA >60 11/07/2016    Lab Results  Component Value Date   WBC 15.3 (H) 11/07/2016   NEUTROABS 13.3 (H) 11/07/2016   HGB 11.3 (L) 11/07/2016   HCT 34.2 (L) 11/07/2016   MCV 81.1 11/07/2016   PLT 366 11/07/2016     STUDIES: Dg Chest 1 View  Result Date: 10/26/2016 CLINICAL  DATA:  Status post attempted right thoracentesis. EXAM: CHEST 1 VIEW COMPARISON:  10/24/2016 FINDINGS: No significant change in appearance of rind of density in the right pleural space. No pneumothorax. Underlying significant airspace and interstitial opacity in the right lung is unchanged. Patchy nodularity in the left lung is unchanged. No significant reaccumulation of left pleural effusion following prior left thoracentesis. IMPRESSION: No pneumothorax or significant change in appearance of the chest after attempted right thoracentesis. Electronically Signed   By: Aletta Edouard M.D.   On: 10/26/2016 17:03   Dg Chest 1 View  Result Date: 10/13/2016 CLINICAL DATA:  S/p right side thora EXAM: CHEST  1 VIEW COMPARISON:  10/07/2016 FINDINGS: No pneumothorax. Improved aeration in the left lung base. Persistent loculated right lateral pneumothorax. Heart size and mediastinal contours are within normal limits. Atheromatous aorta. Stable right IJ port catheter to the low SVC. Visualized bones unremarkable. IMPRESSION: 1. No pneumothorax.  Improved aeration, left lung base. Electronically Signed   By: Lucrezia Europe M.D.   On:  10/13/2016 14:01   Dg Chest 2 View  Result Date: 10/24/2016 CLINICAL DATA:  Shortness of breath.  Pleural effusion. EXAM: CHEST  2 VIEW COMPARISON:  Radiograph of October 13, 2016. FINDINGS: Stable cardiomediastinal silhouette. Atherosclerosis of thoracic aorta is noted. No pneumothorax is noted. Stable loculated right pleural effusion is seen along right lateral chest wall and apex. Stable elevated left hemidiaphragm. Right internal jugular Port-A-Cath is unchanged in position. Stable interstitial densities are noted throughout both lungs which may represent scarring, but superimposed inflammation or edema cannot be excluded. Bony thorax is unremarkable. IMPRESSION: Aortic atherosclerosis. Stable loculated right pleural effusion. Stable interstitial densities throughout both lungs most consistent with scarring, although superimposed edema or inflammation cannot be excluded. Electronically Signed   By: Marijo Conception, M.D.   On: 10/24/2016 10:21   Ct Angio Chest Pe W Or Wo Contrast  Result Date: 10/10/2016 CLINICAL DATA:  Shortness of breath, chest pain weakness for 3 weeks, history of metastatic RIGHT lung cancer 2 years ago, hypertension, GERD, former smoker EXAM: CT ANGIOGRAPHY CHEST WITH CONTRAST TECHNIQUE: Multidetector CT imaging of the chest was performed using the standard protocol during bolus administration of intravenous contrast. Multiplanar CT image reconstructions and MIPs were obtained to evaluate the vascular anatomy. CONTRAST:  75 cc Isovue 370 IV COMPARISON:  05/16/2016 FINDINGS: Cardiovascular: Atherosclerotic calcifications aorta without aneurysm or dissection. No pericardial effusion. Pulmonary arteries well opacified and patent. No evidence of pulmonary embolism. Mediastinum/Nodes: Normal sized axillary lymph nodes bilaterally. No definite mediastinal or hilar adenopathy. Esophagus unremarkable. Base of cervical region normal appearance Lungs/Pleura: Moderate to large LEFT pleural  effusion, increased. Moderate RIGHT pleural effusion, partially loculated. BILATERAL pulmonary nodules compatible with metastatic disease,decreased in sizes and number since the previous exam. LEFT upper lobe nodule 5 mm transverse image 40 previously 9 mm. 4 mm LEFT lower lobe nodule image 47 previously 13 mm. 6 mm LEFT lower lobe nodule image 47 previously 15 mm. Chronic areas of peribronchial thickening and RIGHT perihilar infiltrate remain. Scattered interseptal thickening and fissural thickening in RIGHT lung appears slightly increased. Upper Abdomen: 20 x 16 mm LEFT renal nodule image 98, intermediate attenuation, measured 18 x 15 mm on 01/07/2016. Descending colonic diverticulosis. Remaining visualized upper abdomen unremarkable. Musculoskeletal: No focal osseous lesion. Review of the MIP images confirms the above findings. IMPRESSION: No evidence of pulmonary embolism. Increase in LEFT pleural effusion since previous exam . Chronic partially loculated RIGHT pleural effusion unchanged. Decreased size and number  of previously seen pulmonary metastases. Diffuse interseptal thickening and fissural thickening in the RIGHT lung appears mildly progressive. Aortic atherosclerosis. Indeterminate LEFT renal nodule 20 x 16 mm minimally increased since 01/07/2016. Electronically Signed   By: Lavonia Dana M.D.   On: 10/10/2016 15:48   Nm Pet Image Restag (ps) Skull Base To Thigh  Result Date: 10/18/2016 CLINICAL DATA:  Subsequent treatment strategy for right lung cancer. EXAM: NUCLEAR MEDICINE PET SKULL BASE TO THIGH TECHNIQUE: 12.5 mCi F-18 FDG was injected intravenously. Full-ring PET imaging was performed from the skull base to thigh after the radiotracer. CT data was obtained and used for attenuation correction and anatomic localization. FASTING BLOOD GLUCOSE:  Value: 96 mg/dl COMPARISON:  Multiple exams, including 07/07/2016 FINDINGS: NECK No hypermetabolic lymph nodes in the neck. CHEST Large left and loculated  moderate to large right pleural effusion. Scattered interstitial and airspace opacities bilaterally, right greater than left, with scattered bilateral pulmonary nodules. Posterior airspace opacities in the right lower lobe are associated with hypermetabolic activity which was not present previously, maximum SUV 9.9 (previously 4.5). There is mild hypermetabolic activity within what appears to be the right pleural space and along the right pleural effusion but not the left, suspicious for a malignant effusion. For example, faint hypermetabolic activity peripherally along the right pleural space has a maximum SUV up to 4.4. Coronary, aortic arch, and branch vessel atherosclerotic vascular disease. ABDOMEN/PELVIS No abnormal hypermetabolic activity within the liver, pancreas, adrenal glands, or spleen. No hypermetabolic lymph nodes in the abdomen or pelvis. Aortoiliac atherosclerotic vascular disease. SKELETON Faintly accentuated marrow activity diffusely. Mild sclerosis and lucency in the L2 vertebral body with very faint associated metabolic activity, not appreciably above background. IMPRESSION: 1. Hypermetabolic peripheral nodular airspace opacities in the right lung primarily along the right lower lobe and to a lesser extent peripherally along the right upper lobe, significantly increased from prior and suspicious for malignancy. Suspected malignant right pleural effusion/pleural rind with some low-grade focal hypermetabolic activity and some generalized faint hypermetabolic activity. 2. Bilateral airway thickening with interstitial accentuation much of which is likely from edema, but also with scattered pulmonary nodules. Many of these nodules are technically too small to characterize, but not appreciably hypermetabolic. A component of edema or atypical pneumonia is certainly not excluded given this appearance. 3. Other imaging findings of potential clinical significance: Coronary, aortic arch, and branch vessel  atherosclerotic vascular disease. Aortoiliac atherosclerotic vascular disease. Electronically Signed   By: Van Clines M.D.   On: 10/18/2016 15:21   US Thoracentesis Asp Pleural Space W/img Guide  Result Date: 10/26/2016 CLINICAL DATA:  History of lung carcinoma with chest x-ray demonstrating loculated right pleural effusion. EXAM: CHEST ULTRASOUND COMPARISON:  Chest x-ray on 10/24/2016 and PET scan on 10/18/2016. PROCEDURE: Ultrasound of the chest was performed in a sitting upright position in preparation for right-sided thoracentesis. The posterior right chest wall was prepped with chlorhexidine. Local anesthesia was provided with 1% lidocaine. Under ultrasound guidance, both 6 French Safe-T-Centesis and Yueh catheters were inserted into the posterior right pleural space. Aspiration was performed. The procedure was then aborted. FINDINGS: By ultrasound, there did appear to be a rim of posterior pleural fluid in the right hemithorax. There was absolutely no return of fluid despite direct ultrasound guided visualization of both Safe-T-Centesis and Yueh catheters entering the pleural space. It is felt that there is a combination of fluid loculation and tumor that is prohibiting free fluid return. IMPRESSION: Inability to perform successful ultrasound-guided right thoracentesis despite visualization  of what appeared to be a rim of posterior pleural fluid and direct ultrasound visualization of advancement of both Safe-T-Centesis and Yueh catheters into the posterior right pleural space. There likely is a combination of pleural tumor and fluid loculation/ complex fluid that is not allowing successful thoracentesis. Electronically Signed   By: Aletta Edouard M.D.   On: 10/26/2016 17:02   US Thoracentesis Asp Pleural Space W/img Guide  Result Date: 10/13/2016 CLINICAL DATA:  Metastatic right lung carcinoma. Increasing left pleural effusion. Shortness of breath. EXAM: EXAM THORACENTESIS WITH ULTRASOUND  TECHNIQUE: The procedure, risks (including but not limited to bleeding, infection, organ damage ), benefits, and alternatives were explained to the patient. Questions regarding the procedure were encouraged and answered. The patient understands and consents to the procedure. Survey ultrasound of the left hemithorax was performed and an appropriate skin entry site was localized. Site was marked, prepped with chlorhexidine, draped in usual sterile fashion, infiltrated locally with 1% lidocaine. The Saf-T-Centesis needle was advanced into the pleural space. Clear yellow fluid returned. 1.2 L was removed. Post procedure imaging shows no residual fluid. The patient tolerated procedure well. COMPLICATIONS: COMPLICATIONS None immediate IMPRESSION: Technically successful ultrasound-guided left thoracentesis. Follow-up chest radiograph shows no pneumothorax. Electronically Signed   By: Lucrezia Europe M.D.   On: 10/13/2016 13:58    ONCOLOGY HISTORY: Stage IV ([place] NX M1) right lung non-small cell lung cancer (adenocarcinoma) with malignant right pleural effusion, lymph node and left lung metastasis. On 12/01/14 - Right Pleural Fluid Cytology. POSITIVE FOR MALIGNANT CELLS. ADENOCARCINOMA, CONSISTENT WITH LUNG ORIGIN. Molecular testing for EGFR, ALK and others is negative. Patient initially was started on carboplatinum and pemetrexed and completed 6 cycles on April 06, 2015. She was then switched to maintenance pemetrexed until December 15, 2015 at which time she was noted to have progressive disease. Patient then received nivolumab from Jan 18, 2016 through March 28, 2016 and then had progressive disease. She then received single agent Taxotere April 18, 2016 through October 03, 2016. Patient initiated single agent Abraxane on October 30, 2016.    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  PLAN:  1. Stage IV right lung non-small cell carcinoma with  malignant pleural effusion and bilateral lung metastasis:  PET scan results from October 18, 2016 reviewed independently consistent with progressive disease. Patient had a left thoracentesis recently, but with minimal improvement of her symptoms. Right sided thoracentesis was attempted, but no fluid could be obtained. Given patient's progressive disease, Taxotere has been discontinued and patient will proceed with cycle 1, day 8 of single agent Abraxane. She will receive treatment on on days 1 and 8 of a 21 day cycle. Return to clinic in 2 weeks for consideration of cycle 2, day 1. 2. Headaches: MRI the brain completed on March 03, 2016 is essentially unchanged from previous. 3.  Pain (back, hip, arm): MRI and bone scan results reviewed independently revealing metastatic disease in C6 there is likely causing her symptoms. Lumbar MRI revealed an enhancing lesion and L2 as well as a suspicious 12 mm lesion and right sacrum. Patient has completed XRT. Continue gabapentin 300 mg 3 times per day. Continue 10 mg oxycodone as needed.  4.  Kidney lesion: Unclear if metastatic deposit or second primary of the left kidney. Continue to monitor closely. 5.  Anxiety: Resolved. Continue Xanax 1 mg at night and 0.5 mg as needed during the day. 6.  Hypokalemia: Patient has been instructed  to continue oral potassium supplementation. 7.  Bony lesions: Consider Zometa in the future. 8.  Peripheral edema: Continue Lasix PRN. Will use with caution given patient's history of dehydration. 9.  Shortness of breath/dyspnea exertion: Left sided thoracentesis this past week. Continue dexamethasone.   Patient expressed understanding and was in agreement with this plan. She also understands that She can call clinic at any time with any questions, concerns, or complaints.   Cancer Staging Metastatic lung cancer (metastasis from lung to other site) [facility]) Staging form: Lung, AJCC 7th Edition - Clinical: Stage Unknown ([place], NX, M1) -  Signed by Evlyn Kanner, NP on 01/21/2015   Lloyd Huger, MD   11/08/2016 6:21 PM

## 2016-11-07 ENCOUNTER — Inpatient Hospital Stay (HOSPITAL_BASED_OUTPATIENT_CLINIC_OR_DEPARTMENT_OTHER): Payer: BLUE CROSS/BLUE SHIELD | Admitting: Oncology

## 2016-11-07 ENCOUNTER — Inpatient Hospital Stay: Payer: BLUE CROSS/BLUE SHIELD

## 2016-11-07 VITALS — BP 102/70 | HR 100 | Temp 97.9°F | Wt 167.5 lb

## 2016-11-07 DIAGNOSIS — I700 Atherosclerosis of aorta: Secondary | ICD-10-CM

## 2016-11-07 DIAGNOSIS — I10 Essential (primary) hypertension: Secondary | ICD-10-CM

## 2016-11-07 DIAGNOSIS — C3491 Malignant neoplasm of unspecified part of right bronchus or lung: Secondary | ICD-10-CM

## 2016-11-07 DIAGNOSIS — R531 Weakness: Secondary | ICD-10-CM

## 2016-11-07 DIAGNOSIS — Z87891 Personal history of nicotine dependence: Secondary | ICD-10-CM

## 2016-11-07 DIAGNOSIS — Z79899 Other long term (current) drug therapy: Secondary | ICD-10-CM | POA: Diagnosis not present

## 2016-11-07 DIAGNOSIS — C7951 Secondary malignant neoplasm of bone: Secondary | ICD-10-CM | POA: Diagnosis not present

## 2016-11-07 DIAGNOSIS — E7800 Pure hypercholesterolemia, unspecified: Secondary | ICD-10-CM | POA: Diagnosis not present

## 2016-11-07 DIAGNOSIS — R600 Localized edema: Secondary | ICD-10-CM

## 2016-11-07 DIAGNOSIS — C7802 Secondary malignant neoplasm of left lung: Secondary | ICD-10-CM | POA: Diagnosis not present

## 2016-11-07 DIAGNOSIS — J910 Malignant pleural effusion: Secondary | ICD-10-CM | POA: Diagnosis not present

## 2016-11-07 DIAGNOSIS — K219 Gastro-esophageal reflux disease without esophagitis: Secondary | ICD-10-CM

## 2016-11-07 DIAGNOSIS — Z923 Personal history of irradiation: Secondary | ICD-10-CM

## 2016-11-07 DIAGNOSIS — E876 Hypokalemia: Secondary | ICD-10-CM

## 2016-11-07 LAB — CBC WITH DIFFERENTIAL/PLATELET
BASOS ABS: 0 10*3/uL (ref 0–0.1)
BASOS PCT: 0 %
EOS ABS: 0.5 10*3/uL (ref 0–0.7)
EOS PCT: 4 %
HCT: 34.2 % — ABNORMAL LOW (ref 35.0–47.0)
Hemoglobin: 11.3 g/dL — ABNORMAL LOW (ref 12.0–16.0)
LYMPHS PCT: 7 %
Lymphs Abs: 1 10*3/uL (ref 1.0–3.6)
MCH: 26.8 pg (ref 26.0–34.0)
MCHC: 33.1 g/dL (ref 32.0–36.0)
MCV: 81.1 fL (ref 80.0–100.0)
MONO ABS: 0.4 10*3/uL (ref 0.2–0.9)
Monocytes Relative: 3 %
Neutro Abs: 13.3 10*3/uL — ABNORMAL HIGH (ref 1.4–6.5)
Neutrophils Relative %: 86 %
PLATELETS: 366 10*3/uL (ref 150–440)
RBC: 4.21 MIL/uL (ref 3.80–5.20)
RDW: 19.2 % — AB (ref 11.5–14.5)
WBC: 15.3 10*3/uL — AB (ref 3.6–11.0)

## 2016-11-07 LAB — COMPREHENSIVE METABOLIC PANEL
ALT: 53 U/L (ref 14–54)
AST: 32 U/L (ref 15–41)
Albumin: 3 g/dL — ABNORMAL LOW (ref 3.5–5.0)
Alkaline Phosphatase: 45 U/L (ref 38–126)
Anion gap: 5 (ref 5–15)
BUN: 23 mg/dL — AB (ref 6–20)
CHLORIDE: 104 mmol/L (ref 101–111)
CO2: 27 mmol/L (ref 22–32)
Calcium: 8.5 mg/dL — ABNORMAL LOW (ref 8.9–10.3)
Creatinine, Ser: 0.37 mg/dL — ABNORMAL LOW (ref 0.44–1.00)
Glucose, Bld: 125 mg/dL — ABNORMAL HIGH (ref 65–99)
POTASSIUM: 3.6 mmol/L (ref 3.5–5.1)
SODIUM: 136 mmol/L (ref 135–145)
Total Bilirubin: 0.7 mg/dL (ref 0.3–1.2)
Total Protein: 6 g/dL — ABNORMAL LOW (ref 6.5–8.1)

## 2016-11-07 MED ORDER — PROCHLORPERAZINE MALEATE 10 MG PO TABS
10.0000 mg | ORAL_TABLET | Freq: Once | ORAL | Status: AC
  Administered 2016-11-07: 10 mg via ORAL
  Filled 2016-11-07: qty 1

## 2016-11-07 MED ORDER — HEPARIN SOD (PORK) LOCK FLUSH 100 UNIT/ML IV SOLN
500.0000 [IU] | Freq: Once | INTRAVENOUS | Status: AC | PRN
  Administered 2016-11-07: 500 [IU]
  Filled 2016-11-07: qty 5

## 2016-11-07 MED ORDER — SODIUM CHLORIDE 0.9 % IV SOLN
Freq: Once | INTRAVENOUS | Status: AC
  Administered 2016-11-07: 11:00:00 via INTRAVENOUS
  Filled 2016-11-07: qty 1000

## 2016-11-07 MED ORDER — SODIUM CHLORIDE 0.9% FLUSH
10.0000 mL | INTRAVENOUS | Status: DC | PRN
  Administered 2016-11-07: 10 mL
  Filled 2016-11-07: qty 10

## 2016-11-07 MED ORDER — PACLITAXEL PROTEIN-BOUND CHEMO INJECTION 100 MG
100.0000 mg/m2 | Freq: Once | INTRAVENOUS | Status: AC
  Administered 2016-11-07: 175 mg via INTRAVENOUS
  Filled 2016-11-07: qty 35

## 2016-11-08 ENCOUNTER — Encounter: Payer: Self-pay | Admitting: Oncology

## 2016-11-10 NOTE — Progress Notes (Unsigned)
PSN spoke with patient via phone today.  Patient is needing some help with housekeeping in her home.  PSN mailed to patient a consent form to sign so that housekeeping assistance can be provided through the charitable foundation.

## 2016-11-20 NOTE — Progress Notes (Signed)
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  INTERVAL HISTORY: Patient returns to clinic today for further evaluation and consideration of cycle 2, day 1 of Abraxane. She currently feels well. Her weakness and fatigue have significantly improved. She continues to have mild dyspnea on exertion. She does not complain of back pain today.  She does not complain of headache today. She denies any recent fevers. She has a good appetite. She denies any chest pain or tightness or hemoptysis. She denies any nausea, vomiting, constipation, or diarrhea. She has no urinary complaints.  Patient offers no further specific complaints today.  REVIEW OF SYSTEMS:   Review of Systems  Constitutional: Negative.  Negative for fever, malaise/fatigue and weight loss.  Respiratory: Positive for shortness of breath. Negative for cough and hemoptysis.   Cardiovascular: Negative.  Negative for chest pain and leg swelling.  Gastrointestinal: Negative.  Negative for abdominal pain.  Genitourinary: Negative.   Musculoskeletal: Negative.   Neurological: Negative.  Negative for weakness.  Psychiatric/Behavioral: Negative.  The patient is not nervous/anxious.     As per HPI. Otherwise, a complete review of systems is negative.  PAST MEDICAL HISTORY: Past Medical History:  Diagnosis Date  . Anemia 01/21/2015  . 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 Anxiety   . GERD (gastroesophageal reflux disease)   . Hypercholesteremia   . Hypertension   . Last menstrual period (LMP) > 10 days ago 2007  . Lung cancer ([employer])   . Metastatic lung cancer (metastasis from lung to other site) [facility]) 01/21/2015    PAST SURGICAL  HISTORY: Past Surgical History:  Procedure Laterality Date  . ABDOMINAL HYSTERECTOMY     partial  . INSERTION / PLACEMENT PLEURAL CATHETER    . PORTACATH PLACEMENT Right 04/26/2015   Procedure: INSERTION PORT-A-CATH;  Surgeon: Nestor Lewandowsky, MD;  Location: [facility] ORS;  Service: General;  Laterality: Right;    FAMILY HISTORY: Family History  Problem Relation Age of Onset  . Cervical cancer Mother   . Uterine cancer Maternal Grandmother     ADVANCED DIRECTIVES (Y/N):  N  HEALTH MAINTENANCE: Social History  Substance Use Topics  . Smoking status: Former Smoker    Packs/day: 0.25    Years: 8.00    Types: Cigarettes    Quit date: 07/20/2014  . Smokeless tobacco: Never Used  . Alcohol use No     Colonoscopy:  PAP:  Bone density:  Lipid panel:  Allergies  Allergen Reactions  . Tegaderm Ag Mesh [Silver] Other (See Comments)    blisters  . Aspirin Other (See Comments)    GIB    Current Outpatient Prescriptions  Medication Sig Dispense Refill  . ALPRAZolam (XANAX) 1 MG tablet TAKE 1/2 BY MOUTH 1 TO 2 TIMES DAILY AS NEEDED FOR ANXIETY AND TAKE 1 BY MOUTH AT NIGHT FOR ANXIETY/SLEEP 45 tablet 0  . docusate sodium (COLACE) 100 MG capsule Take 100 mg by mouth 2 (two) times daily as needed for mild constipation.    . furosemide (LASIX) 20 MG tablet Take 1 tablet (20 mg total) by mouth daily. 30 tablet 2  . gabapentin (NEURONTIN) 300 MG capsule Take 1 capsule (300 mg total) by mouth 3 (three) times daily. 270 capsule 3  . lidocaine-prilocaine (  EMLA) cream Apply cream 1 hour before chemotherapy treatment 30 g 1  . lisinopril-hydrochlorothiazide (PRINZIDE,ZESTORETIC) 10-12.5 MG per tablet Take 1 tablet by mouth daily.    [name] loratadine (CLARITIN) 10 MG tablet Take 10 mg by mouth daily.    . Multiple Vitamins-Minerals (MULTIVITAMIN WITH MINERALS) tablet Take 1 tablet by mouth daily. 30 tablet 3  . omeprazole (PRILOSEC) 20 MG capsule Take 1 capsule (20 mg total) by mouth daily. 30 capsule 0   . potassium chloride SA (K-DUR,KLOR-CON) 20 MEQ tablet Take 1 tablet (20 mEq total) by mouth daily. 90 tablet 1  . promethazine (PHENERGAN) 25 MG tablet Take 1 tablet (25 mg total) by mouth every 6 (six) hours as needed for nausea or vomiting. 30 tablet 0  . simvastatin (ZOCOR) 20 MG tablet Take 20 mg by mouth at bedtime.     . vitamin B-12 (CYANOCOBALAMIN) 1000 MCG tablet Take 1,000 mcg by mouth daily.    [name] dexamethasone (DECADRON) 4 MG tablet Take 1 tablet (4 mg total) by mouth daily. 30 tablet 0  . oxyCODONE (OXY IR/ROXICODONE) 5 MG immediate release tablet Take 1 tablet (5 mg total) by mouth 3 (three) times daily as needed for severe pain. (Patient not taking: Reported on 11/21/2016) 90 tablet 0   No current facility-administered medications for this visit.    Facility-Administered Medications Ordered in Other Visits  Medication Dose Route Frequency Provider Last Rate Last Dose  . heparin lock flush 100 unit/mL  250 Units Intracatheter PRN Evlyn Kanner, NP      . sodium chloride 0.9 % injection 10 mL  10 mL Intracatheter PRN Evlyn Kanner, NP      . sodium chloride 0.9 % injection 10 mL  10 mL Intravenous PRN Leia Alf, MD   10 mL at 04/27/15 0905  . sodium chloride 0.9 % injection 3 mL  3 mL Intracatheter PRN Evlyn Kanner, NP        OBJECTIVE: Vitals:   11/21/16 0953  BP: (!) 89/64  Pulse: 98  Temp: 97.3 F (36.3 C)     There is no height or weight on file to calculate BMI.    ECOG FS:1 - Symptomatic but completely ambulatory  General: Well-developed, well-nourished, no acute distress. Eyes: Pink conjunctiva, anicteric sclera. Lungs: Diminished breath sounds bilaterally. Heart: Regular rate and rhythm. No rubs, murmurs, or gallops. Abdomen: Soft, nontender, nondistended. No organomegaly noted, normoactive bowel sounds. Musculoskeletal: No edema, cyanosis, or clubbing. Neuro: Alert, answering all questions appropriately. Cranial nerves grossly intact. Skin: No  rashes or petechiae noted. Psych: Normal affect.   LAB RESULTS:  Lab Results  Component Value Date   NA 136 11/21/2016   K 2.9 (L) 11/21/2016   CL 105 11/21/2016   CO2 25 11/21/2016   GLUCOSE 106 (H) 11/21/2016   BUN 23 (H) 11/21/2016   CREATININE 0.59 11/21/2016   CALCIUM 8.4 (L) 11/21/2016   PROT 5.4 (L) 11/21/2016   ALBUMIN 2.9 (L) 11/21/2016   AST 15 11/21/2016   ALT 15 11/21/2016   ALKPHOS 41 11/21/2016   BILITOT 0.5 11/21/2016   GFRNONAA >60 11/21/2016   GFRAA >60 11/21/2016    Lab Results  Component Value Date   WBC 10.1 11/21/2016   NEUTROABS 7.0 (H) 11/21/2016   HGB 10.8 (L) 11/21/2016   HCT 32.6 (L) 11/21/2016   MCV 84.0 11/21/2016   PLT 293 11/21/2016     STUDIES: No results found.  ONCOLOGY HISTORY: Stage IV ([place] NX M1) right lung  non-small cell lung cancer (adenocarcinoma) with malignant right pleural effusion, lymph node and left lung metastasis. On 12/01/14 - Right Pleural Fluid Cytology. POSITIVE FOR MALIGNANT CELLS. ADENOCARCINOMA, CONSISTENT WITH LUNG ORIGIN. Molecular testing for EGFR, ALK and others is negative. Patient initially was started on carboplatinum and pemetrexed and completed 6 cycles on April 06, 2015. She was then switched to maintenance pemetrexed until December 15, 2015 at which time she was noted to have progressive disease. Patient then received nivolumab from Jan 18, 2016 through March 28, 2016 and then had progressive disease. She then received single agent Taxotere April 18, 2016 through October 03, 2016. Patient initiated single agent Abraxane on October 30, 2016.    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  PLAN:  1. 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 Patient had a left thoracentesis  recently, but with minimal improvement of her symptoms. Right sided thoracentesis was attempted, but no fluid could be obtained. Given patient's progressive disease, Taxotere was discontinued. Proceed with cycle 2, day 1 of single agent Abraxane. She will receive treatment on on days 1 and 8 of a 21 day cycle. Return to clinic in 1 week for consideration of cycle 2, day 8. 2. Headaches: MRI the brain completed on March 03, 2016 is essentially unchanged from previous. 3.  Pain (back, hip, arm): MRI and bone scan results reviewed independently revealing metastatic disease in C6 there is likely causing her symptoms. Lumbar MRI revealed an enhancing lesion and L2 as well as a suspicious 12 mm lesion and right sacrum. Patient has completed XRT. Continue gabapentin 300 mg 3 times per day. Continue 10 mg oxycodone as needed.  4.  Kidney lesion: Unclear if metastatic deposit or second primary of the left kidney. Continue to monitor closely. 5.  Anxiety: Resolved. Continue Xanax 1 mg at night and 0.5 mg as needed during the day. 6.  Hypokalemia: Patient has been instructed to continue oral potassium supplementation. She also receive 30 mEq IV potassium today. 7.  Bony lesions: Consider Zometa in the future. 8.  Peripheral edema: Continue Lasix PRN. Will use with caution given patient's history of dehydration.   Patient expressed understanding and was in agreement with this plan. She also understands that She can call clinic at any time with any questions, concerns, or complaints.   Cancer Staging Metastatic lung cancer (metastasis from lung to other site) [facility]) Staging form: Lung, AJCC 7th Edition - Clinical: Stage Unknown ([place], NX, M1) - Signed by Evlyn Kanner, NP on 01/21/2015   Lloyd Huger, MD   11/25/2016 6:24 PM

## 2016-11-21 ENCOUNTER — Encounter: Payer: Self-pay | Admitting: Oncology

## 2016-11-21 ENCOUNTER — Inpatient Hospital Stay: Payer: BLUE CROSS/BLUE SHIELD

## 2016-11-21 ENCOUNTER — Inpatient Hospital Stay (HOSPITAL_BASED_OUTPATIENT_CLINIC_OR_DEPARTMENT_OTHER): Payer: BLUE CROSS/BLUE SHIELD | Admitting: Oncology

## 2016-11-21 ENCOUNTER — Inpatient Hospital Stay: Payer: BLUE CROSS/BLUE SHIELD | Attending: Oncology

## 2016-11-21 VITALS — BP 105/80 | HR 90 | Resp 20

## 2016-11-21 VITALS — BP 89/64 | HR 98 | Temp 97.3°F

## 2016-11-21 DIAGNOSIS — Z79899 Other long term (current) drug therapy: Secondary | ICD-10-CM

## 2016-11-21 DIAGNOSIS — R531 Weakness: Secondary | ICD-10-CM | POA: Insufficient documentation

## 2016-11-21 DIAGNOSIS — I119 Hypertensive heart disease without heart failure: Secondary | ICD-10-CM | POA: Diagnosis not present

## 2016-11-21 DIAGNOSIS — I10 Essential (primary) hypertension: Secondary | ICD-10-CM | POA: Diagnosis not present

## 2016-11-21 DIAGNOSIS — C7951 Secondary malignant neoplasm of bone: Secondary | ICD-10-CM | POA: Diagnosis not present

## 2016-11-21 DIAGNOSIS — Z5111 Encounter for antineoplastic chemotherapy: Secondary | ICD-10-CM | POA: Insufficient documentation

## 2016-11-21 DIAGNOSIS — R5381 Other malaise: Secondary | ICD-10-CM | POA: Diagnosis not present

## 2016-11-21 DIAGNOSIS — R630 Anorexia: Secondary | ICD-10-CM | POA: Insufficient documentation

## 2016-11-21 DIAGNOSIS — R110 Nausea: Secondary | ICD-10-CM | POA: Diagnosis not present

## 2016-11-21 DIAGNOSIS — C3491 Malignant neoplasm of unspecified part of right bronchus or lung: Secondary | ICD-10-CM | POA: Insufficient documentation

## 2016-11-21 DIAGNOSIS — R0609 Other forms of dyspnea: Secondary | ICD-10-CM | POA: Diagnosis not present

## 2016-11-21 DIAGNOSIS — I517 Cardiomegaly: Secondary | ICD-10-CM | POA: Insufficient documentation

## 2016-11-21 DIAGNOSIS — E7800 Pure hypercholesterolemia, unspecified: Secondary | ICD-10-CM

## 2016-11-21 DIAGNOSIS — Z923 Personal history of irradiation: Secondary | ICD-10-CM | POA: Diagnosis not present

## 2016-11-21 DIAGNOSIS — Z7952 Long term (current) use of systemic steroids: Secondary | ICD-10-CM | POA: Insufficient documentation

## 2016-11-21 DIAGNOSIS — F419 Anxiety disorder, unspecified: Secondary | ICD-10-CM | POA: Insufficient documentation

## 2016-11-21 DIAGNOSIS — R0602 Shortness of breath: Secondary | ICD-10-CM | POA: Insufficient documentation

## 2016-11-21 DIAGNOSIS — E860 Dehydration: Secondary | ICD-10-CM | POA: Diagnosis not present

## 2016-11-21 DIAGNOSIS — K219 Gastro-esophageal reflux disease without esophagitis: Secondary | ICD-10-CM

## 2016-11-21 DIAGNOSIS — J910 Malignant pleural effusion: Secondary | ICD-10-CM | POA: Insufficient documentation

## 2016-11-21 DIAGNOSIS — E876 Hypokalemia: Secondary | ICD-10-CM | POA: Insufficient documentation

## 2016-11-21 DIAGNOSIS — R51 Headache: Secondary | ICD-10-CM | POA: Diagnosis not present

## 2016-11-21 DIAGNOSIS — F17210 Nicotine dependence, cigarettes, uncomplicated: Secondary | ICD-10-CM

## 2016-11-21 DIAGNOSIS — C7802 Secondary malignant neoplasm of left lung: Secondary | ICD-10-CM | POA: Diagnosis not present

## 2016-11-21 DIAGNOSIS — R5383 Other fatigue: Secondary | ICD-10-CM | POA: Diagnosis not present

## 2016-11-21 DIAGNOSIS — E041 Nontoxic single thyroid nodule: Secondary | ICD-10-CM | POA: Insufficient documentation

## 2016-11-21 DIAGNOSIS — Z87891 Personal history of nicotine dependence: Secondary | ICD-10-CM | POA: Insufficient documentation

## 2016-11-21 DIAGNOSIS — N289 Disorder of kidney and ureter, unspecified: Secondary | ICD-10-CM | POA: Diagnosis not present

## 2016-11-21 DIAGNOSIS — R600 Localized edema: Secondary | ICD-10-CM | POA: Diagnosis not present

## 2016-11-21 DIAGNOSIS — Z9221 Personal history of antineoplastic chemotherapy: Secondary | ICD-10-CM | POA: Insufficient documentation

## 2016-11-21 DIAGNOSIS — Z8049 Family history of malignant neoplasm of other genital organs: Secondary | ICD-10-CM | POA: Insufficient documentation

## 2016-11-21 LAB — CBC WITH DIFFERENTIAL/PLATELET
BASOS ABS: 0.1 10*3/uL (ref 0–0.1)
Basophils Relative: 1 %
EOS ABS: 0.6 10*3/uL (ref 0–0.7)
EOS PCT: 6 %
HCT: 32.6 % — ABNORMAL LOW (ref 35.0–47.0)
Hemoglobin: 10.8 g/dL — ABNORMAL LOW (ref 12.0–16.0)
LYMPHS ABS: 1.5 10*3/uL (ref 1.0–3.6)
Lymphocytes Relative: 15 %
MCH: 27.8 pg (ref 26.0–34.0)
MCHC: 33.1 g/dL (ref 32.0–36.0)
MCV: 84 fL (ref 80.0–100.0)
Monocytes Absolute: 0.9 10*3/uL (ref 0.2–0.9)
Monocytes Relative: 9 %
Neutro Abs: 7 10*3/uL — ABNORMAL HIGH (ref 1.4–6.5)
Neutrophils Relative %: 69 %
PLATELETS: 293 10*3/uL (ref 150–440)
RBC: 3.88 MIL/uL (ref 3.80–5.20)
RDW: 18.9 % — ABNORMAL HIGH (ref 11.5–14.5)
WBC: 10.1 10*3/uL (ref 3.6–11.0)

## 2016-11-21 LAB — COMPREHENSIVE METABOLIC PANEL
ALT: 15 U/L (ref 14–54)
ANION GAP: 6 (ref 5–15)
AST: 15 U/L (ref 15–41)
Albumin: 2.9 g/dL — ABNORMAL LOW (ref 3.5–5.0)
Alkaline Phosphatase: 41 U/L (ref 38–126)
BUN: 23 mg/dL — ABNORMAL HIGH (ref 6–20)
CHLORIDE: 105 mmol/L (ref 101–111)
CO2: 25 mmol/L (ref 22–32)
CREATININE: 0.59 mg/dL (ref 0.44–1.00)
Calcium: 8.4 mg/dL — ABNORMAL LOW (ref 8.9–10.3)
GFR calc non Af Amer: >60 mL/min (ref >60)
Glucose, Bld: 106 mg/dL — ABNORMAL HIGH (ref 65–99)
POTASSIUM: 2.9 mmol/L — AB (ref 3.5–5.1)
SODIUM: 136 mmol/L (ref 135–145)
Total Bilirubin: 0.5 mg/dL (ref 0.3–1.2)
Total Protein: 5.4 g/dL — ABNORMAL LOW (ref 6.5–8.1)

## 2016-11-21 MED ORDER — HEPARIN SOD (PORK) LOCK FLUSH 100 UNIT/ML IV SOLN
500.0000 [IU] | Freq: Once | INTRAVENOUS | Status: AC | PRN
  Administered 2016-11-21: 500 [IU]
  Filled 2016-11-21: qty 5

## 2016-11-21 MED ORDER — SODIUM CHLORIDE 0.9 % IV SOLN
40.0000 meq | Freq: Once | INTRAVENOUS | Status: AC
  Administered 2016-11-21: 40 meq via INTRAVENOUS
  Filled 2016-11-21: qty 500

## 2016-11-21 MED ORDER — SODIUM CHLORIDE 0.9 % IV SOLN
Freq: Once | INTRAVENOUS | Status: AC
  Administered 2016-11-21: 11:00:00 via INTRAVENOUS
  Filled 2016-11-21: qty 1000

## 2016-11-21 MED ORDER — PACLITAXEL PROTEIN-BOUND CHEMO INJECTION 100 MG
100.0000 mg/m2 | Freq: Once | INTRAVENOUS | Status: AC
  Administered 2016-11-21: 175 mg via INTRAVENOUS
  Filled 2016-11-21: qty 35

## 2016-11-21 MED ORDER — PROCHLORPERAZINE MALEATE 10 MG PO TABS
10.0000 mg | ORAL_TABLET | Freq: Once | ORAL | Status: AC
  Administered 2016-11-21: 10 mg via ORAL
  Filled 2016-11-21: qty 1

## 2016-11-24 ENCOUNTER — Other Ambulatory Visit: Payer: Self-pay | Admitting: Oncology

## 2016-11-24 DIAGNOSIS — C3491 Malignant neoplasm of unspecified part of right bronchus or lung: Secondary | ICD-10-CM

## 2016-11-24 DIAGNOSIS — J90 Pleural effusion, not elsewhere classified: Secondary | ICD-10-CM

## 2016-11-24 MED ORDER — DEXAMETHASONE 4 MG PO TABS: 4.0000 mg | ORAL_TABLET | Freq: Every day | ORAL | 0 refills | Status: DC

## 2016-11-24 NOTE — Telephone Encounter (Signed)
PT HAS ONLY 1 PILL LEFT ON HER DEXAMENTHASONE  ([place])

## 2016-11-26 NOTE — Progress Notes (Signed)
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  INTERVAL HISTORY: Patient returns to clinic today for further evaluation and consideration of cycle 2, day 8 of Abraxane. She is having significant shortness of breath as well as chest pain. Her weakness and fatigue are worse this week. She continues to have mild dyspnea on exertion. She does not complain of back pain today. She does not complain of headache today. She denies any recent fevers. She has a poor appetite.  She denies any nausea, vomiting, constipation, or diarrhea. She has no urinary complaints.  Patient feels generally terrible, but offers no further specific complaints today.  REVIEW OF SYSTEMS:   Review of Systems  Constitutional: Positive for malaise/fatigue. Negative for fever and weight loss.  Respiratory: Positive for shortness of breath. Negative for cough and hemoptysis.   Cardiovascular: Positive for chest pain. Negative for leg swelling.  Gastrointestinal: Positive for nausea. Negative for abdominal pain.  Genitourinary: Negative.   Musculoskeletal: Negative.   Neurological: Positive for weakness.  Psychiatric/Behavioral: Negative.  The patient is not nervous/anxious.     As per HPI. Otherwise, a complete review of systems is negative.  PAST MEDICAL HISTORY: Past Medical History:  Diagnosis Date  . Anemia 01/21/2015  . 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 Anxiety   . GERD (gastroesophageal reflux disease)   . Hypercholesteremia   . Hypertension   . Last menstrual period (LMP) > 10 days ago 2007  . Lung cancer ([place])   . Metastatic lung cancer (metastasis from lung to other site) [facility]) 01/21/2015    PAST  SURGICAL HISTORY: Past Surgical History:  Procedure Laterality Date  . ABDOMINAL HYSTERECTOMY     partial  . INSERTION / PLACEMENT PLEURAL CATHETER    . PORTACATH PLACEMENT Right 04/26/2015   Procedure: INSERTION PORT-A-CATH;  Surgeon: Nestor Lewandowsky, MD;  Location: [facility] ORS;  Service: General;  Laterality: Right;    FAMILY HISTORY: Family History  Problem Relation Age of Onset  . Cervical cancer Mother   . Uterine cancer Maternal Grandmother     ADVANCED DIRECTIVES (Y/N):  N  HEALTH MAINTENANCE: Social History  Substance Use Topics  . Smoking status: Former Smoker    Packs/day: 0.25    Years: 8.00    Types: Cigarettes    Quit date: 07/20/2014  . Smokeless tobacco: Never Used  . Alcohol use No     Colonoscopy:  PAP:  Bone density:  Lipid panel:  Allergies  Allergen Reactions  . Tegaderm Ag Mesh [Silver] Other (See Comments)    blisters  . Aspirin Other (See Comments)    GIB    Current Outpatient Prescriptions  Medication Sig Dispense Refill  . ALPRAZolam (XANAX) 1 MG tablet TAKE 1/2 BY MOUTH 1 TO 2 TIMES DAILY AS NEEDED FOR ANXIETY AND TAKE 1 BY MOUTH AT NIGHT FOR ANXIETY/SLEEP 45 tablet 0  . dexamethasone (DECADRON) 4 MG tablet Take 1 tablet (4 mg total) by mouth daily. 30 tablet 0  . docusate sodium (COLACE) 100 MG capsule Take 100 mg by mouth 2 (two) times daily as needed for mild constipation.    . furosemide (LASIX) 20 MG tablet Take 1 tablet (20 mg total) by mouth daily. 30 tablet 2  . gabapentin (NEURONTIN)  300 MG capsule Take 1 capsule (300 mg total) by mouth 3 (three) times daily. 270 capsule 3  . lidocaine-prilocaine (EMLA) cream Apply cream 1 hour before chemotherapy treatment 30 g 1  . lisinopril-hydrochlorothiazide (PRINZIDE,ZESTORETIC) 10-12.5 MG per tablet Take 1 tablet by mouth daily.    [name] loratadine (CLARITIN) 10 MG tablet Take 10 mg by mouth daily.    . Multiple Vitamins-Minerals (MULTIVITAMIN WITH MINERALS) tablet Take 1 tablet by mouth daily. 30  tablet 3  . omeprazole (PRILOSEC) 20 MG capsule Take 1 capsule (20 mg total) by mouth daily. 30 capsule 0  . potassium chloride SA (K-DUR,KLOR-CON) 20 MEQ tablet Take 1 tablet (20 mEq total) by mouth daily. 90 tablet 1  . promethazine (PHENERGAN) 25 MG tablet Take 1 tablet (25 mg total) by mouth every 6 (six) hours as needed for nausea or vomiting. 30 tablet 0  . simvastatin (ZOCOR) 20 MG tablet Take 20 mg by mouth at bedtime.     . traMADol (ULTRAM) 50 MG tablet Take 1 tablet (50 mg total) by mouth every 8 (eight) hours as needed for moderate pain. 60 tablet 0  . vitamin B-12 (CYANOCOBALAMIN) 1000 MCG tablet Take 1,000 mcg by mouth daily.    . methylPREDNISolone (MEDROL DOSEPAK) 4 MG TBPK tablet Take six tablets on day one, five tablets on day two, four on day three, three on day four, two on day five, take one on day six. Then stop. 21 tablet 0  . oxyCODONE (OXY IR/ROXICODONE) 5 MG immediate release tablet Take 1 tablet (5 mg total) by mouth 3 (three) times daily as needed for severe pain. (Patient not taking: Reported on 11/21/2016) 90 tablet 0   No current facility-administered medications for this visit.    Facility-Administered Medications Ordered in Other Visits  Medication Dose Route Frequency Provider Last Rate Last Dose  . heparin lock flush 100 unit/mL  250 Units Intracatheter PRN Evlyn Kanner, NP      . sodium chloride 0.9 % injection 10 mL  10 mL Intracatheter PRN Evlyn Kanner, NP      . sodium chloride 0.9 % injection 10 mL  10 mL Intravenous PRN Leia Alf, MD   10 mL at 04/27/15 0905  . sodium chloride 0.9 % injection 3 mL  3 mL Intracatheter PRN Evlyn Kanner, NP        OBJECTIVE: Vitals:   11/28/16 1113  BP: 116/83  Pulse: (!) 108  Resp: 18  Temp: (!) 96.1 F (35.6 C)     There is no height or weight on file to calculate BMI.    ECOG FS:2 - Symptomatic, <50% confined to bed  General: Well-developed, well-nourished, no acute distress. Eyes: Pink  conjunctiva, anicteric sclera. Lungs: Diminished breath sounds bilaterally. Heart: Regular rate and rhythm. No rubs, murmurs, or gallops. Abdomen: Soft, nontender, nondistended. No organomegaly noted, normoactive bowel sounds. Musculoskeletal: No edema, cyanosis, or clubbing. Neuro: Alert, answering all questions appropriately. Cranial nerves grossly intact. Skin: No rashes or petechiae noted. Psych: Normal affect.   LAB RESULTS:  Lab Results  Component Value Date   NA 133 (L) 11/28/2016   K 3.5 11/28/2016   CL 103 11/28/2016   CO2 24 11/28/2016   GLUCOSE 86 11/28/2016   BUN 20 11/28/2016   CREATININE 0.55 11/28/2016   CALCIUM 8.2 (L) 11/28/2016   PROT 5.6 (L) 11/28/2016   ALBUMIN 2.8 (L) 11/28/2016   AST 14 (L) 11/28/2016   ALT 15 11/28/2016   ALKPHOS 38 11/28/2016  BILITOT 1.2 11/28/2016   GFRNONAA >60 11/28/2016   GFRAA >60 11/28/2016    Lab Results  Component Value Date   WBC 7.0 11/28/2016   NEUTROABS 5.4 11/28/2016   HGB 11.3 (L) 11/28/2016   HCT 34.2 (L) 11/28/2016   MCV 83.9 11/28/2016   PLT 280 11/28/2016     STUDIES: Dg Chest 2 View  Result Date: 11/28/2016 CLINICAL DATA:  Stage IV lung cancer . EXAM: CHEST  2 VIEW COMPARISON:  10/26/2016. FINDINGS: Port-A-Cath noted with tip over the superior vena cava. Cardiomegaly. Diffuse bilateral nodular pulmonary interstitial prominence with prominent right pleural effusion and small left pleural effusion noted. These changes could be related to malignancy. Active pneumonitis cannot be excluded. No pneumothorax. No acute bony abnormality . IMPRESSION: 1. Port-A-Cath in stable position. 2. Diffuse bilateral nodular pulmonary interstitial prominence with bilateral pleural effusions, right side greater than left again noted. These changes could be malignant. Active pneumonitis cannot be excluded. No change from prior exam. Electronically Signed   By: Marcello Moores  Register   On: 11/28/2016 11:09    ONCOLOGY HISTORY: Stage IV  ([place] NX M1) right lung non-small cell lung cancer (adenocarcinoma) with malignant right pleural effusion, lymph node and left lung metastasis. On 12/01/14 - Right Pleural Fluid Cytology. POSITIVE FOR MALIGNANT CELLS. ADENOCARCINOMA, CONSISTENT WITH LUNG ORIGIN. Molecular testing for EGFR, ALK and others is negative. Patient initially was started on carboplatinum and pemetrexed and completed 6 cycles on April 06, 2015. She was then switched to maintenance pemetrexed until December 15, 2015 at which time she was noted to have progressive disease. Patient then received nivolumab from Jan 18, 2016 through March 28, 2016 and then had progressive disease. She then received single agent Taxotere April 18, 2016 through October 03, 2016. Patient initiated single agent Abraxane on October 30, 2016.    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  PLAN:  1. 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 Patient had a left thoracentesis recently, but with minimal improvement of her symptoms. Right sided thoracentesis was attempted, but no fluid could be obtained. Given patient's progressive disease, Taxotere was discontinued. Delay cycle 2, day 8 of single agent Abraxane secondary to worsening shortness of breath and declining performance status. Chest x-ray as above with possible worsening disease in her right lung. We will get a CT scan later this week for further evaluation. Return to clinic in 1 week for further evaluation and reconsideration of cycle 2, day 8. 2. Headaches: MRI the brain completed on March 03, 2016 is essentially unchanged from previous. 3.  Pain (back, hip, arm): MRI and bone scan results reviewed independently revealing metastatic disease in C6 there is likely causing  her symptoms. Lumbar MRI revealed an enhancing lesion and L2 as well as a suspicious 12 mm lesion and right sacrum. Patient has completed XRT. Continue gabapentin 300 mg 3 times per day. Continue 10 mg oxycodone as needed.  4.  Kidney lesion: Unclear if metastatic deposit or second primary of the left kidney. Continue to monitor closely. 5.  Anxiety: Resolved. Continue Xanax 1 mg at night and 0.5 mg as needed during the day. 6.  Hypokalemia: Patient has been instructed to continue oral potassium supplementation. She also receive 30 mEq IV potassium today. 7.  Bony lesions: Consider Zometa in the future. 8.  Peripheral edema: Continue Lasix PRN. Will use with caution given patient's history of dehydration. 9. Shortness of breath: Chest x-ray as above. Will get CT scan to further evaluate.   Patient expressed understanding and was in agreement with this plan. She also understands that She can call clinic at any time with any questions, concerns, or complaints.   Cancer Staging Metastatic lung cancer (metastasis from lung to other site) [facility]) Staging form: Lung, AJCC 7th Edition - Clinical: Stage Unknown ([place], NX, M1) - Signed by Evlyn Kanner, NP on 01/21/2015   Lloyd Huger, MD   11/30/2016 8:44 AM

## 2016-11-27 ENCOUNTER — Telehealth: Payer: Self-pay | Admitting: *Deleted

## 2016-11-27 MED ORDER — TRAMADOL HCL 50 MG PO TABS: 50.0000 mg | ORAL_TABLET | Freq: Three times a day (TID) | ORAL | 0 refills | Status: DC | PRN

## 2016-11-27 NOTE — Telephone Encounter (Signed)
Complaining of pain in the center of her chest radiating into her back. Very weak, when she walks to bathroom, feels she cannot catch her breath. She is not taking her Oxycodone, does not like the way it makes her feel. Requesting something not as strong. She states she has an appt tomorrow and wants you to be aware of this but would like something for the pain today.

## 2016-11-28 ENCOUNTER — Ambulatory Visit
Admission: RE | Admit: 2016-11-28 | Discharge: 2016-11-28 | Disposition: A | Discharge status: Home / Self Care | Payer: BLUE CROSS/BLUE SHIELD | Source: Ambulatory Visit | Attending: Oncology | Admitting: Oncology

## 2016-11-28 ENCOUNTER — Inpatient Hospital Stay: Payer: BLUE CROSS/BLUE SHIELD

## 2016-11-28 ENCOUNTER — Inpatient Hospital Stay (HOSPITAL_BASED_OUTPATIENT_CLINIC_OR_DEPARTMENT_OTHER): Payer: BLUE CROSS/BLUE SHIELD | Admitting: Oncology

## 2016-11-28 VITALS — BP 116/83 | HR 108 | Temp 96.1°F | Resp 18

## 2016-11-28 DIAGNOSIS — E860 Dehydration: Secondary | ICD-10-CM | POA: Diagnosis not present

## 2016-11-28 DIAGNOSIS — Z87891 Personal history of nicotine dependence: Secondary | ICD-10-CM

## 2016-11-28 DIAGNOSIS — C7802 Secondary malignant neoplasm of left lung: Secondary | ICD-10-CM | POA: Diagnosis not present

## 2016-11-28 DIAGNOSIS — R600 Localized edema: Secondary | ICD-10-CM

## 2016-11-28 DIAGNOSIS — R110 Nausea: Secondary | ICD-10-CM

## 2016-11-28 DIAGNOSIS — N289 Disorder of kidney and ureter, unspecified: Secondary | ICD-10-CM

## 2016-11-28 DIAGNOSIS — R0609 Other forms of dyspnea: Secondary | ICD-10-CM

## 2016-11-28 DIAGNOSIS — C3491 Malignant neoplasm of unspecified part of right bronchus or lung: Secondary | ICD-10-CM | POA: Diagnosis present

## 2016-11-28 DIAGNOSIS — I517 Cardiomegaly: Secondary | ICD-10-CM

## 2016-11-28 DIAGNOSIS — Z8049 Family history of malignant neoplasm of other genital organs: Secondary | ICD-10-CM

## 2016-11-28 DIAGNOSIS — Z7952 Long term (current) use of systemic steroids: Secondary | ICD-10-CM

## 2016-11-28 DIAGNOSIS — E876 Hypokalemia: Secondary | ICD-10-CM

## 2016-11-28 DIAGNOSIS — Z452 Encounter for adjustment and management of vascular access device: Secondary | ICD-10-CM | POA: Diagnosis not present

## 2016-11-28 DIAGNOSIS — J910 Malignant pleural effusion: Secondary | ICD-10-CM

## 2016-11-28 DIAGNOSIS — R5381 Other malaise: Secondary | ICD-10-CM

## 2016-11-28 DIAGNOSIS — Z923 Personal history of irradiation: Secondary | ICD-10-CM | POA: Diagnosis not present

## 2016-11-28 DIAGNOSIS — I119 Hypertensive heart disease without heart failure: Secondary | ICD-10-CM

## 2016-11-28 DIAGNOSIS — C7951 Secondary malignant neoplasm of bone: Secondary | ICD-10-CM

## 2016-11-28 DIAGNOSIS — J90 Pleural effusion, not elsewhere classified: Secondary | ICD-10-CM | POA: Diagnosis not present

## 2016-11-28 DIAGNOSIS — R5383 Other fatigue: Secondary | ICD-10-CM | POA: Diagnosis not present

## 2016-11-28 DIAGNOSIS — Z79899 Other long term (current) drug therapy: Secondary | ICD-10-CM

## 2016-11-28 DIAGNOSIS — K219 Gastro-esophageal reflux disease without esophagitis: Secondary | ICD-10-CM

## 2016-11-28 DIAGNOSIS — R630 Anorexia: Secondary | ICD-10-CM

## 2016-11-28 DIAGNOSIS — E7800 Pure hypercholesterolemia, unspecified: Secondary | ICD-10-CM

## 2016-11-28 DIAGNOSIS — I10 Essential (primary) hypertension: Secondary | ICD-10-CM

## 2016-11-28 LAB — COMPREHENSIVE METABOLIC PANEL
ALK PHOS: 38 U/L (ref 38–126)
ALT: 15 U/L (ref 14–54)
AST: 14 U/L — ABNORMAL LOW (ref 15–41)
Albumin: 2.8 g/dL — ABNORMAL LOW (ref 3.5–5.0)
Anion gap: 6 (ref 5–15)
BUN: 20 mg/dL (ref 6–20)
CALCIUM: 8.2 mg/dL — AB (ref 8.9–10.3)
CO2: 24 mmol/L (ref 22–32)
CREATININE: 0.55 mg/dL (ref 0.44–1.00)
Chloride: 103 mmol/L (ref 101–111)
GFR calc non Af Amer: >60 mL/min (ref >60)
GLUCOSE: 86 mg/dL (ref 65–99)
Potassium: 3.5 mmol/L (ref 3.5–5.1)
SODIUM: 133 mmol/L — AB (ref 135–145)
Total Bilirubin: 1.2 mg/dL (ref 0.3–1.2)
Total Protein: 5.6 g/dL — ABNORMAL LOW (ref 6.5–8.1)

## 2016-11-28 LAB — CBC WITH DIFFERENTIAL/PLATELET
Basophils Absolute: 0.1 10*3/uL (ref 0–0.1)
Basophils Relative: 1 %
EOS ABS: 0.1 10*3/uL (ref 0–0.7)
Eosinophils Relative: 2 %
HEMATOCRIT: 34.2 % — AB (ref 35.0–47.0)
HEMOGLOBIN: 11.3 g/dL — AB (ref 12.0–16.0)
LYMPHS ABS: 1.1 10*3/uL (ref 1.0–3.6)
LYMPHS PCT: 16 %
MCH: 27.7 pg (ref 26.0–34.0)
MCHC: 33 g/dL (ref 32.0–36.0)
MCV: 83.9 fL (ref 80.0–100.0)
Monocytes Absolute: 0.3 10*3/uL (ref 0.2–0.9)
Monocytes Relative: 4 %
NEUTROS ABS: 5.4 10*3/uL (ref 1.4–6.5)
NEUTROS PCT: 77 %
Platelets: 280 10*3/uL (ref 150–440)
RBC: 4.07 MIL/uL (ref 3.80–5.20)
RDW: 17 % — ABNORMAL HIGH (ref 11.5–14.5)
WBC: 7 10*3/uL (ref 3.6–11.0)

## 2016-11-28 MED ORDER — HEPARIN SOD (PORK) LOCK FLUSH 100 UNIT/ML IV SOLN
500.0000 [IU] | Freq: Once | INTRAVENOUS | Status: AC
  Administered 2016-11-28: 500 [IU] via INTRAVENOUS
  Filled 2016-11-28: qty 5

## 2016-11-28 MED ORDER — SODIUM CHLORIDE 0.9% FLUSH
10.0000 mL | INTRAVENOUS | Status: DC | PRN
  Administered 2016-11-28: 10 mL via INTRAVENOUS
  Filled 2016-11-28: qty 10

## 2016-11-28 MED ORDER — METHYLPREDNISOLONE 4 MG PO TBPK: ORAL_TABLET | ORAL | 0 refills | Status: DC

## 2016-11-28 NOTE — Telephone Encounter (Signed)
Patient has been given Tramadol She was seen in the clinic today

## 2016-11-28 NOTE — Progress Notes (Signed)
Patient is here here for follow up, she mentions shortness of breath. She has no energy. She would like to get on oxygen to help her with her breathing at night and when walking

## 2016-11-30 ENCOUNTER — Ambulatory Visit
Admission: RE | Admit: 2016-11-30 | Discharge: 2016-11-30 | Disposition: A | Discharge status: Home / Self Care | Payer: BLUE CROSS/BLUE SHIELD | Source: Ambulatory Visit | Attending: Oncology | Admitting: Oncology

## 2016-11-30 DIAGNOSIS — I2510 Atherosclerotic heart disease of native coronary artery without angina pectoris: Secondary | ICD-10-CM | POA: Insufficient documentation

## 2016-11-30 DIAGNOSIS — J90 Pleural effusion, not elsewhere classified: Secondary | ICD-10-CM | POA: Insufficient documentation

## 2016-11-30 DIAGNOSIS — N289 Disorder of kidney and ureter, unspecified: Secondary | ICD-10-CM | POA: Insufficient documentation

## 2016-11-30 DIAGNOSIS — I700 Atherosclerosis of aorta: Secondary | ICD-10-CM | POA: Insufficient documentation

## 2016-11-30 DIAGNOSIS — C3491 Malignant neoplasm of unspecified part of right bronchus or lung: Secondary | ICD-10-CM | POA: Insufficient documentation

## 2016-11-30 MED ORDER — IOPAMIDOL (ISOVUE-300) INJECTION 61%
75.0000 mL | Freq: Once | INTRAVENOUS | Status: AC | PRN
  Administered 2016-11-30: 75 mL via INTRAVENOUS

## 2016-12-03 DIAGNOSIS — Z7189 Other specified counseling: Secondary | ICD-10-CM | POA: Insufficient documentation

## 2016-12-03 NOTE — Progress Notes (Signed)
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  INTERVAL HISTORY: Patient returns to clinic today for further evaluation and discussion of her imaging results. Her chest pain and shortness of breath have improved since initiating steroids. She continues to have increased weakness and fatigue. She does not complain of back pain today. She does not complain of headache today. She denies any recent fevers. Her appetite has mildly improved. She denies any nausea, vomiting, constipation, or diarrhea. She has no urinary complaints. Patient offers no further specific complaints today.  REVIEW OF SYSTEMS:   Review of Systems  Constitutional: Positive for malaise/fatigue. Negative for fever and weight loss.  Respiratory: Negative for cough, hemoptysis and shortness of breath.   Cardiovascular: Negative for chest pain and leg swelling.  Gastrointestinal: Negative for abdominal pain and nausea.  Genitourinary: Negative.   Musculoskeletal: Negative.   Neurological: Positive for weakness.  Psychiatric/Behavioral: Negative.  The patient is not nervous/anxious.     As per HPI. Otherwise, a complete review of systems is negative.  PAST MEDICAL HISTORY: Past Medical History:  Diagnosis Date  . Anemia 01/21/2015  . 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 Anxiety   . GERD (gastroesophageal reflux disease)   . Hypercholesteremia   . Hypertension   . Last menstrual period (LMP) > 10 days ago 2007  . Lung cancer ([place])   . Metastatic lung cancer (metastasis from lung to other site) [facility]) 01/21/2015    PAST SURGICAL HISTORY: Past Surgical History:  Procedure Laterality Date  . ABDOMINAL HYSTERECTOMY     partial  . INSERTION / PLACEMENT PLEURAL CATHETER    . PORTACATH PLACEMENT Right 04/26/2015   Procedure: INSERTION PORT-A-CATH;  Surgeon: Nestor Lewandowsky, MD;  Location: [facility] ORS;  Service: General;  Laterality: Right;    FAMILY HISTORY: Family History  Problem Relation Age of Onset  . Cervical cancer Mother   . Uterine cancer Maternal Grandmother     ADVANCED DIRECTIVES (Y/N):  N  HEALTH MAINTENANCE: Social History  Substance Use Topics  . Smoking status: Former Smoker    Packs/day: 0.25    Years: 8.00    Types: Cigarettes    Quit date: 07/20/2014  . Smokeless tobacco: Never Used  . Alcohol use No     Colonoscopy:  PAP:  Bone density:  Lipid panel:  Allergies  Allergen Reactions  . Tegaderm Ag Mesh [Silver] Other (See Comments)    blisters  . Aspirin Other (See Comments)    GIB    Current Outpatient Prescriptions  Medication Sig Dispense Refill  . ALPRAZolam (XANAX) 1 MG tablet TAKE 1/2 BY MOUTH 1 TO 2 TIMES DAILY AS NEEDED FOR ANXIETY AND TAKE 1 BY MOUTH AT NIGHT FOR ANXIETY/SLEEP 45 tablet 0  . dexamethasone (DECADRON) 4 MG tablet Take 1 tablet (4 mg total) by mouth daily. 30 tablet 0  . docusate sodium (COLACE) 100 MG capsule Take 100 mg by mouth 2 (two) times daily as needed for mild constipation.    . furosemide (LASIX) 20 MG tablet Take 1 tablet (20 mg total) by mouth daily. 30 tablet 2  . gabapentin (NEURONTIN) 300 MG capsule Take 1 capsule (300 mg total) by mouth 3 (three) times daily. 270 capsule 3  . lidocaine-prilocaine (EMLA)  cream Apply cream 1 hour before chemotherapy treatment 30 g 1  . lisinopril-hydrochlorothiazide (PRINZIDE,ZESTORETIC) 10-12.5 MG per tablet Take 1 tablet by mouth daily.    [name] loratadine (CLARITIN) 10 MG tablet Take 10 mg by mouth daily.    . methylPREDNISolone (MEDROL DOSEPAK) 4 MG TBPK tablet Take six tablets on day one, five tablets on day two, four on day three, three on day four, two on day five, take one on day six. Then stop. 21  tablet 0  . Multiple Vitamins-Minerals (MULTIVITAMIN WITH MINERALS) tablet Take 1 tablet by mouth daily. 30 tablet 3  . omeprazole (PRILOSEC) 20 MG capsule Take 1 capsule (20 mg total) by mouth daily. 30 capsule 0  . oxyCODONE (OXY IR/ROXICODONE) 5 MG immediate release tablet Take 1 tablet (5 mg total) by mouth 3 (three) times daily as needed for severe pain. 90 tablet 0  . potassium chloride SA (K-DUR,KLOR-CON) 20 MEQ tablet Take 1 tablet (20 mEq total) by mouth daily. 90 tablet 1  . promethazine (PHENERGAN) 25 MG tablet Take 1 tablet (25 mg total) by mouth every 6 (six) hours as needed for nausea or vomiting. 30 tablet 0  . simvastatin (ZOCOR) 20 MG tablet Take 20 mg by mouth at bedtime.     . traMADol (ULTRAM) 50 MG tablet Take 1 tablet (50 mg total) by mouth every 8 (eight) hours as needed for moderate pain. 60 tablet 0  . vitamin B-12 (CYANOCOBALAMIN) 1000 MCG tablet Take 1,000 mcg by mouth daily.     No current facility-administered medications for this visit.    Facility-Administered Medications Ordered in Other Visits  Medication Dose Route Frequency Provider Last Rate Last Dose  . heparin lock flush 100 unit/mL  250 Units Intracatheter PRN Evlyn Kanner, NP      . sodium chloride 0.9 % injection 10 mL  10 mL Intracatheter PRN Evlyn Kanner, NP      . sodium chloride 0.9 % injection 10 mL  10 mL Intravenous PRN Leia Alf, MD   10 mL at 04/27/15 0905  . sodium chloride 0.9 % injection 3 mL  3 mL Intracatheter PRN Evlyn Kanner, NP        OBJECTIVE: Vitals:   12/05/16 1346  BP: 108/68  Pulse: 86  Resp: 18  Temp: 97.1 F (36.2 C)     Body mass index is 28.36 kg/m.    ECOG FS:2 - Symptomatic, <50% confined to bed  General: Well-developed, well-nourished, no acute distress. Eyes: Pink conjunctiva, anicteric sclera. Lungs: Diminished breath sounds bilaterally. Heart: Regular rate and rhythm. No rubs, murmurs, or gallops. Abdomen: Soft, nontender, nondistended. No  organomegaly noted, normoactive bowel sounds. Musculoskeletal: No edema, cyanosis, or clubbing. Neuro: Alert, answering all questions appropriately. Cranial nerves grossly intact. Skin: No rashes or petechiae noted. Psych: Normal affect.   LAB RESULTS:  Lab Results  Component Value Date   NA 135 12/05/2016   K 3.6 12/05/2016   CL 101 12/05/2016   CO2 30 12/05/2016   GLUCOSE 120 (H) 12/05/2016   BUN 19 12/05/2016   CREATININE 0.49 12/05/2016   CALCIUM 8.5 (L) 12/05/2016   PROT 5.7 (L) 12/05/2016   ALBUMIN 2.8 (L) 12/05/2016   AST 14 (L) 12/05/2016   ALT 18 12/05/2016   ALKPHOS 37 (L) 12/05/2016   BILITOT 0.5 12/05/2016   GFRNONAA >60 12/05/2016   GFRAA >60 12/05/2016    Lab Results  Component Value Date   WBC 10.3 12/05/2016   NEUTROABS 9.3 (H)  12/05/2016   HGB 11.4 (L) 12/05/2016   HCT 34.2 (L) 12/05/2016   MCV 83.9 12/05/2016   PLT 307 12/05/2016     STUDIES: Dg Chest 2 View  Result Date: 11/28/2016 CLINICAL DATA:  Stage IV lung cancer . EXAM: CHEST  2 VIEW COMPARISON:  10/26/2016. FINDINGS: Port-A-Cath noted with tip over the superior vena cava. Cardiomegaly. Diffuse bilateral nodular pulmonary interstitial prominence with prominent right pleural effusion and small left pleural effusion noted. These changes could be related to malignancy. Active pneumonitis cannot be excluded. No pneumothorax. No acute bony abnormality . IMPRESSION: 1. Port-A-Cath in stable position. 2. Diffuse bilateral nodular pulmonary interstitial prominence with bilateral pleural effusions, right side greater than left again noted. These changes could be malignant. Active pneumonitis cannot be excluded. No change from prior exam. Electronically Signed   By: Marcello Moores  Register   On: 11/28/2016 11:09   Ct Chest W Contrast  Result Date: 12/01/2016 CLINICAL DATA:  Right-sided lung cancer with metastasis. Worsening shortness of breath. Left thoracentesis 3 weeks ago. Status post radiation therapy and  chemotherapy. EXAM: CT CHEST WITH CONTRAST TECHNIQUE: Multidetector CT imaging of the chest was performed during intravenous contrast administration. CONTRAST:  87m ISOVUE-300 IOPAMIDOL (ISOVUE-300) INJECTION 61% COMPARISON:  Plain films 11/28/2016. PET 10/18/2016. Chest CT 10/10/2016. FINDINGS: Cardiovascular: A right Port-A-Cath which terminates at the mid SVC. Advanced aortic and branch vessel atherosclerosis. Normal heart size, without pericardial effusion. Multivessel coronary artery atherosclerosis. No central pulmonary embolism, on this non-dedicated study. Mediastinum/Nodes: Right-sided thyroid nodule measures 9 mm and is nonspecific. Mediastinal edema, without well-defined adenopathy. No hilar adenopathy. Lungs/Pleura: Circumferential right-sided pleural fluid and thickening are similar. Areas of hyperattenuation along the parietal pleural surface, including on image 108/series 2, suspicious for pleural metastasis. The most masslike area measures on the order of 12 mm. Moderate left pleural effusion is similar and layers posteriorly. No complicating factors identified. Irregular right-sided septal thickening with areas of peribronchovascular consolidation. Slightly progressive and highly suspicious for lymphangitic tumor spread. Example at the right apex on image 42/series 3. Pulmonary nodules are most apparent on the left. An index left lower lobe nodule measures 10 mm on image 73/series 3 versus 6 mm on the prior exam (when remeasured). Index left upper lobe 6 mm nodule on image 78/series 3 measured 4 mm on the prior exam (when remeasured). Upper Abdomen: Normal imaged portions of the liver, spleen, pancreas, adrenal glands, right kidney. Gastric underdistention. Indeterminate upper pole left renal lesion measures 1.9 cm and is incompletely imaged but grossly similar in size to on the prior exam. Musculoskeletal: No acute osseous abnormality. IMPRESSION: 1. Since 10/10/2016, disease progression. 2.  similar left larger than right pleural effusions. The right-sided pleural effusion is complex, with areas of pleural nodularity, highly suspicious for malignant effusion. 3. Progression of right-sided pulmonary process which is suspicious for lymphangitic tumor spread. 4. Progressive pulmonary nodules/metastasis. 5.  Coronary artery atherosclerosis. Aortic atherosclerosis. 6. Complex left renal lesion is incompletely imaged but grossly similar in size to on the prior exam. Electronically Signed   By: KAbigail MiyamotoM.D.   On: 12/01/2016 08:19    ONCOLOGY HISTORY: [place] right lung non-small cell lung cancer (adenocarcinoma) with malignant right pleural effusion, lymph node and left lung metastasis. On 12/01/14 - Right Pleural Fluid Cytology. POSITIVE FOR MALIGNANT CELLS. ADENOCARCINOMA, CONSISTENT WITH LUNG ORIGIN. Molecular testing for EGFR, ALK and others is negative. Patient initially was started on carboplatinum and pemetrexed and completed 6 cycles on April 06, 2015. She was then switched to maintenance pemetrexed until December 15, 2015 at which time she was noted to have progressive disease. Patient then received nivolumab from Jan 18, 2016 through March 28, 2016 and then had progressive disease. She then received single agent Taxotere April 18, 2016 through October 03, 2016. Patient initiated single agent Abraxane on October 30, 2016 which was discontinued after 3 cycles on November 21, 2016 for progression of disease. Patient initiated vinorelbine and gemcitabine on December 12, 2016.   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  PLAN:  1. 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 Hospice and end-of-life her once again discussed with patient, but she  is declining at this time and wishes to pursue additional treatment. Will proceed with dose reduced gemcitabine and vinorelbine on day 1 and 8 with day 15 off if her blood counts can tolerate. Return to clinic in 1 week for consideration of cycle 1, day 1 of gemcitabine and vinorelbine. 2. Headaches: MRI the brain completed on March 03, 2016 is essentially unchanged from previous. 3.  Pain (back, hip, arm): MRI and bone scan results reviewed independently revealing metastatic disease in C6 there is likely causing her symptoms. Lumbar MRI revealed an enhancing lesion and L2 as well as a suspicious 12 mm lesion and right sacrum. Patient has completed XRT. Continue gabapentin 300 mg 3 times per day. Continue 10 mg oxycodone as needed.  4.  Kidney lesion: Unclear if metastatic deposit or second primary of the left kidney. Continue to monitor closely. 5.  Anxiety: Resolved. Continue Xanax 1 mg at night and 0.5 mg as needed during the day. 6.  Hypokalemia: Patient has been instructed to continue oral potassium supplementation. She also receive 30 mEq IV potassium today. 7.  Bony lesions: Consider Zometa in the future. 8.  Peripheral edema: Continue Lasix PRN. Will use with caution given patient's history of dehydration. 9.  Shortness of breath: Improved with Decadron. CT as above with progression of disease.   Patient expressed understanding and was in agreement with this plan. She also understands that She can call clinic at any time with any questions, concerns, or complaints.   Cancer Staging Metastatic lung cancer (metastasis from lung to other site) [facility]) Staging form: Lung, AJCC 7th Edition - Clinical: Stage Unknown ([place], NX, M1) - Signed by Evlyn Kanner, NP on 01/21/2015   Lloyd Huger, MD   12/10/2016 9:58 AM

## 2016-12-04 ENCOUNTER — Other Ambulatory Visit: Payer: Self-pay | Admitting: *Deleted

## 2016-12-04 DIAGNOSIS — C3491 Malignant neoplasm of unspecified part of right bronchus or lung: Secondary | ICD-10-CM

## 2016-12-04 MED ORDER — ALPRAZOLAM 1 MG PO TABS: ORAL_TABLET | ORAL | 0 refills | Status: AC

## 2016-12-05 ENCOUNTER — Inpatient Hospital Stay (HOSPITAL_BASED_OUTPATIENT_CLINIC_OR_DEPARTMENT_OTHER): Payer: BLUE CROSS/BLUE SHIELD | Admitting: Oncology

## 2016-12-05 ENCOUNTER — Inpatient Hospital Stay: Payer: BLUE CROSS/BLUE SHIELD

## 2016-12-05 ENCOUNTER — Encounter: Payer: Self-pay | Admitting: Oncology

## 2016-12-05 VITALS — BP 108/68 | HR 86 | Temp 97.1°F | Resp 18 | Ht 65.0 in | Wt 170.4 lb

## 2016-12-05 DIAGNOSIS — C7951 Secondary malignant neoplasm of bone: Secondary | ICD-10-CM

## 2016-12-05 DIAGNOSIS — K219 Gastro-esophageal reflux disease without esophagitis: Secondary | ICD-10-CM

## 2016-12-05 DIAGNOSIS — Z8049 Family history of malignant neoplasm of other genital organs: Secondary | ICD-10-CM

## 2016-12-05 DIAGNOSIS — Z79899 Other long term (current) drug therapy: Secondary | ICD-10-CM

## 2016-12-05 DIAGNOSIS — R5381 Other malaise: Secondary | ICD-10-CM

## 2016-12-05 DIAGNOSIS — Z9221 Personal history of antineoplastic chemotherapy: Secondary | ICD-10-CM

## 2016-12-05 DIAGNOSIS — R531 Weakness: Secondary | ICD-10-CM

## 2016-12-05 DIAGNOSIS — Z7952 Long term (current) use of systemic steroids: Secondary | ICD-10-CM

## 2016-12-05 DIAGNOSIS — R5383 Other fatigue: Secondary | ICD-10-CM

## 2016-12-05 DIAGNOSIS — I10 Essential (primary) hypertension: Secondary | ICD-10-CM

## 2016-12-05 DIAGNOSIS — Z923 Personal history of irradiation: Secondary | ICD-10-CM

## 2016-12-05 DIAGNOSIS — C3481 Malignant neoplasm of overlapping sites of right bronchus and lung: Secondary | ICD-10-CM

## 2016-12-05 DIAGNOSIS — R51 Headache: Secondary | ICD-10-CM

## 2016-12-05 DIAGNOSIS — I517 Cardiomegaly: Secondary | ICD-10-CM

## 2016-12-05 DIAGNOSIS — C7802 Secondary malignant neoplasm of left lung: Secondary | ICD-10-CM

## 2016-12-05 DIAGNOSIS — J910 Malignant pleural effusion: Secondary | ICD-10-CM | POA: Diagnosis not present

## 2016-12-05 DIAGNOSIS — Z7189 Other specified counseling: Secondary | ICD-10-CM

## 2016-12-05 DIAGNOSIS — E041 Nontoxic single thyroid nodule: Secondary | ICD-10-CM

## 2016-12-05 DIAGNOSIS — C3491 Malignant neoplasm of unspecified part of right bronchus or lung: Secondary | ICD-10-CM

## 2016-12-05 DIAGNOSIS — N289 Disorder of kidney and ureter, unspecified: Secondary | ICD-10-CM

## 2016-12-05 DIAGNOSIS — E876 Hypokalemia: Secondary | ICD-10-CM

## 2016-12-05 DIAGNOSIS — D381 Neoplasm of uncertain behavior of trachea, bronchus and lung: Secondary | ICD-10-CM

## 2016-12-05 DIAGNOSIS — E7800 Pure hypercholesterolemia, unspecified: Secondary | ICD-10-CM

## 2016-12-05 DIAGNOSIS — I119 Hypertensive heart disease without heart failure: Secondary | ICD-10-CM

## 2016-12-05 DIAGNOSIS — F419 Anxiety disorder, unspecified: Secondary | ICD-10-CM

## 2016-12-05 DIAGNOSIS — R0609 Other forms of dyspnea: Secondary | ICD-10-CM

## 2016-12-05 DIAGNOSIS — Z87891 Personal history of nicotine dependence: Secondary | ICD-10-CM

## 2016-12-05 LAB — CBC WITH DIFFERENTIAL/PLATELET
BASOS PCT: 0 %
Basophils Absolute: 0 10*3/uL (ref 0–0.1)
Eosinophils Absolute: 0.1 10*3/uL (ref 0–0.7)
Eosinophils Relative: 1 %
HEMATOCRIT: 34.2 % — AB (ref 35.0–47.0)
HEMOGLOBIN: 11.4 g/dL — AB (ref 12.0–16.0)
LYMPHS PCT: 6 %
Lymphs Abs: 0.6 10*3/uL — ABNORMAL LOW (ref 1.0–3.6)
MCH: 27.9 pg (ref 26.0–34.0)
MCHC: 33.2 g/dL (ref 32.0–36.0)
MCV: 83.9 fL (ref 80.0–100.0)
MONOS PCT: 2 %
Monocytes Absolute: 0.2 10*3/uL (ref 0.2–0.9)
NEUTROS ABS: 9.3 10*3/uL — AB (ref 1.4–6.5)
NEUTROS PCT: 91 %
Platelets: 307 10*3/uL (ref 150–440)
RBC: 4.08 MIL/uL (ref 3.80–5.20)
RDW: 17.2 % — AB (ref 11.5–14.5)
WBC: 10.3 10*3/uL (ref 3.6–11.0)

## 2016-12-05 LAB — COMPREHENSIVE METABOLIC PANEL
ALBUMIN: 2.8 g/dL — AB (ref 3.5–5.0)
ALK PHOS: 37 U/L — AB (ref 38–126)
ALT: 18 U/L (ref 14–54)
ANION GAP: 4 — AB (ref 5–15)
AST: 14 U/L — ABNORMAL LOW (ref 15–41)
BILIRUBIN TOTAL: 0.5 mg/dL (ref 0.3–1.2)
BUN: 19 mg/dL (ref 6–20)
CALCIUM: 8.5 mg/dL — AB (ref 8.9–10.3)
CO2: 30 mmol/L (ref 22–32)
Chloride: 101 mmol/L (ref 101–111)
Creatinine, Ser: 0.49 mg/dL (ref 0.44–1.00)
GLUCOSE: 120 mg/dL — AB (ref 65–99)
POTASSIUM: 3.6 mmol/L (ref 3.5–5.1)
Sodium: 135 mmol/L (ref 135–145)
TOTAL PROTEIN: 5.7 g/dL — AB (ref 6.5–8.1)

## 2016-12-05 MED ORDER — HEPARIN SOD (PORK) LOCK FLUSH 100 UNIT/ML IV SOLN
500.0000 [IU] | Freq: Once | INTRAVENOUS | Status: AC
  Administered 2016-12-05: 500 [IU] via INTRAVENOUS
  Filled 2016-12-05: qty 5

## 2016-12-05 NOTE — Progress Notes (Signed)
Pt states she has sometimes discomfort in her chest between her breasts and now that she has oxygen she is better but noticed the same discomfort yest.

## 2016-12-10 NOTE — Progress Notes (Signed)
Non-Small Cell Lung - No Medical Intervention - Off Treatment.  Patient Characteristics: Stage IV Metastatic, Non Squamous, Fourth Line and Beyond - Chemotherapy/Immunotherapy, PS = 0, 1, Prior PD-1/PD-L1 Inhibitor or No Prior PD-1/PD-L1 Inhibitor and Not a Candidate for Immunotherapy AJCC T Category: TX Current Disease Status: Distant Metastases AJCC N Category: NX AJCC M Category: M1a AJCC 8 Stage Grouping: IVA Histology: Non Squamous Cell ROS1 Rearrangement Status: Negative T790M Mutation Status: Not Applicable - EGFR Mutation Negative/Unknown Other Mutations/Biomarkers: No Other Actionable Mutations PD-L1 Expression Status: PD-L1 Negative Chemotherapy/Immunotherapy LOT: Fourth Line and Beyond Chemotherapy/Immunotherapy Molecular Targeted Therapy: Not Appropriate ALK Translocation Status: Negative Would you be surprised if this patient died  in the next year? I would NOT be surprised if this patient died in the next year EGFR Mutation Status: Non-Sensitizing BRAF V600E Mutation Status: Negative Performance Status: PS = 0, 1 Immunotherapy Candidate Status: Not a Candidate for Immunotherapy Prior Immunotherapy Status: Prior PD-1/PD-L1 Inhibitor

## 2016-12-10 NOTE — Progress Notes (Signed)
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  INTERVAL HISTORY: Patient returns to clinic today for further evaluation and initiation of cycle 1, day 1 of gemcitabine and vinorelbine. Her chest pain and shortness of breath have improved since initiating steroids. She continues to have increased weakness and fatigue, but admits this is mildly improved as well. She woke up this morning with swelling of her right eye and side of face. She has no visual complaints. She continues to have chronic back pain. She does not complain of headache today. She denies any recent fevers. Her appetite has mildly improved. She denies any nausea, vomiting, constipation, or diarrhea. She has no urinary complaints. Patient offers no further specific complaints today.  REVIEW OF SYSTEMS:   Review of Systems  Constitutional: Positive for malaise/fatigue. Negative for fever and weight loss.  HENT:       Right eye and facial swelling  Eyes: Negative.   Respiratory: Negative for cough, hemoptysis and shortness of breath.   Cardiovascular: Negative for chest pain and leg swelling.  Gastrointestinal: Negative for abdominal pain and nausea.  Genitourinary: Negative.   Musculoskeletal: Positive for back pain.  Neurological: Positive for weakness.  Psychiatric/Behavioral: Negative.  The patient is not nervous/anxious.     As per HPI. Otherwise, a complete review of systems is negative.  PAST MEDICAL HISTORY: Past Medical History:  Diagnosis Date  . Anemia 01/21/2015  . 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 Anxiety   . GERD (gastroesophageal reflux disease)   . Hypercholesteremia   . Hypertension   . Last  menstrual period (LMP) > 10 days ago 2007  . Lung cancer ([place])   . Metastatic lung cancer (metastasis from lung to other site) [facility]) 01/21/2015    PAST SURGICAL HISTORY: Past Surgical History:  Procedure Laterality Date  . ABDOMINAL HYSTERECTOMY     partial  . INSERTION / PLACEMENT PLEURAL CATHETER    . PORTACATH PLACEMENT Right 04/26/2015   Procedure: INSERTION PORT-A-CATH;  Surgeon: Nestor Lewandowsky, MD;  Location: [facility] ORS;  Service: General;  Laterality: Right;    FAMILY HISTORY: Family History  Problem Relation Age of Onset  . Cervical cancer Mother   . Uterine cancer Maternal Grandmother     ADVANCED DIRECTIVES (Y/N):  N  HEALTH MAINTENANCE: Social History  Substance Use Topics  . Smoking status: Former Smoker    Packs/day: 0.25    Years: 8.00    Types: Cigarettes    Quit date: 07/20/2014  . Smokeless tobacco: Never Used  . Alcohol use No     Colonoscopy:  PAP:  Bone density:  Lipid panel:  Allergies  Allergen Reactions  . Tegaderm Ag Mesh [Silver] Other (See Comments)    blisters  . Aspirin Other (See Comments)    GIB    Current Outpatient Prescriptions  Medication Sig Dispense Refill  . ALPRAZolam (XANAX) 1 MG tablet TAKE 1/2 BY MOUTH 1 TO 2 TIMES DAILY AS NEEDED FOR ANXIETY AND TAKE 1 BY MOUTH AT NIGHT FOR ANXIETY/SLEEP 45 tablet 0  . dexamethasone (DECADRON) 4 MG tablet Take 1 tablet (4 mg total) by mouth daily. 30 tablet 0  . docusate sodium (COLACE) 100 MG capsule Take 100 mg by mouth 2 (two) times daily  as needed for mild constipation.    . furosemide (LASIX) 20 MG tablet Take 1 tablet (20 mg total) by mouth daily. 30 tablet 2  . gabapentin (NEURONTIN) 300 MG capsule Take 1 capsule (300 mg total) by mouth 3 (three) times daily. 270 capsule 3  . lidocaine-prilocaine (EMLA) cream Apply cream 1 hour before chemotherapy treatment 30 g 1  . lisinopril-hydrochlorothiazide (PRINZIDE,ZESTORETIC) 10-12.5 MG per tablet Take 1 tablet by mouth daily.    [name] loratadine  (CLARITIN) 10 MG tablet Take 10 mg by mouth daily.    . methylPREDNISolone (MEDROL DOSEPAK) 4 MG TBPK tablet Take six tablets on day one, five tablets on day two, four on day three, three on day four, two on day five, take one on day six. Then stop. 21 tablet 0  . Multiple Vitamins-Minerals (MULTIVITAMIN WITH MINERALS) tablet Take 1 tablet by mouth daily. 30 tablet 3  . omeprazole (PRILOSEC) 20 MG capsule Take 1 capsule (20 mg total) by mouth daily. 30 capsule 0  . oxyCODONE (OXY IR/ROXICODONE) 5 MG immediate release tablet Take 1 tablet (5 mg total) by mouth 3 (three) times daily as needed for severe pain. 90 tablet 0  . potassium chloride SA (K-DUR,KLOR-CON) 20 MEQ tablet Take 1 tablet (20 mEq total) by mouth daily. 90 tablet 1  . promethazine (PHENERGAN) 25 MG tablet Take 1 tablet (25 mg total) by mouth every 6 (six) hours as needed for nausea or vomiting. 30 tablet 0  . simvastatin (ZOCOR) 20 MG tablet Take 20 mg by mouth at bedtime.     . traMADol (ULTRAM) 50 MG tablet Take 1 tablet (50 mg total) by mouth every 8 (eight) hours as needed for moderate pain. 60 tablet 0  . vitamin B-12 (CYANOCOBALAMIN) 1000 MCG tablet Take 1,000 mcg by mouth daily.     No current facility-administered medications for this visit.    Facility-Administered Medications Ordered in Other Visits  Medication Dose Route Frequency Provider Last Rate Last Dose  . heparin lock flush 100 unit/mL  250 Units Intracatheter PRN Evlyn Kanner, NP      . sodium chloride 0.9 % injection 10 mL  10 mL Intracatheter PRN Evlyn Kanner, NP      . sodium chloride 0.9 % injection 10 mL  10 mL Intravenous PRN Leia Alf, MD   10 mL at 04/27/15 0905  . sodium chloride 0.9 % injection 3 mL  3 mL Intracatheter PRN Evlyn Kanner, NP        OBJECTIVE: Vitals:   12/12/16 1440  BP: 104/79  Pulse: 96  Resp: 18  Temp: (!) 96 F (35.6 C)     Body mass index is 28.96 kg/m.    ECOG FS:2 - Symptomatic, <50% confined to  bed  General: Well-developed, well-nourished, no acute distress. HEENT: Right and face with increased swelling. No visual changes. Ocular motion intact. Eyes: Pink conjunctiva, anicteric sclera. Lungs: Diminished breath sounds bilaterally. Heart: Regular rate and rhythm. No rubs, murmurs, or gallops. Abdomen: Soft, nontender, nondistended. No organomegaly noted, normoactive bowel sounds. Musculoskeletal: No edema, cyanosis, or clubbing. Neuro: Alert, answering all questions appropriately. Cranial nerves grossly intact. Skin: No rashes or petechiae noted. Psych: Normal affect.   LAB RESULTS:  Lab Results  Component Value Date   NA 137 12/12/2016   K 3.9 12/12/2016   CL 101 12/12/2016   CO2 31 12/12/2016   GLUCOSE 111 (H) 12/12/2016   BUN 22 (H) 12/12/2016   CREATININE 0.63 12/12/2016  CALCIUM 8.5 (L) 12/12/2016   PROT 5.5 (L) 12/12/2016   ALBUMIN 2.6 (L) 12/12/2016   AST 16 12/12/2016   ALT 22 12/12/2016   ALKPHOS 35 (L) 12/12/2016   BILITOT 0.6 12/12/2016   GFRNONAA >60 12/12/2016   GFRAA >60 12/12/2016    Lab Results  Component Value Date   WBC 15.7 (H) 12/12/2016   NEUTROABS 14.4 (H) 12/12/2016   HGB 11.6 (L) 12/12/2016   HCT 35.6 12/12/2016   MCV 83.3 12/12/2016   PLT 274 12/12/2016     STUDIES: Dg Chest 2 View  Result Date: 11/28/2016 CLINICAL DATA:  Stage IV lung cancer . EXAM: CHEST  2 VIEW COMPARISON:  10/26/2016. FINDINGS: Port-A-Cath noted with tip over the superior vena cava. Cardiomegaly. Diffuse bilateral nodular pulmonary interstitial prominence with prominent right pleural effusion and small left pleural effusion noted. These changes could be related to malignancy. Active pneumonitis cannot be excluded. No pneumothorax. No acute bony abnormality . IMPRESSION: 1. Port-A-Cath in stable position. 2. Diffuse bilateral nodular pulmonary interstitial prominence with bilateral pleural effusions, right side greater than left again noted. These changes could be  malignant. Active pneumonitis cannot be excluded. No change from prior exam. Electronically Signed   By: Marcello Moores  Register   On: 11/28/2016 11:09   Ct Chest W Contrast  Result Date: 12/01/2016 CLINICAL DATA:  Right-sided lung cancer with metastasis. Worsening shortness of breath. Left thoracentesis 3 weeks ago. Status post radiation therapy and chemotherapy. EXAM: CT CHEST WITH CONTRAST TECHNIQUE: Multidetector CT imaging of the chest was performed during intravenous contrast administration. CONTRAST:  93m ISOVUE-300 IOPAMIDOL (ISOVUE-300) INJECTION 61% COMPARISON:  Plain films 11/28/2016. PET 10/18/2016. Chest CT 10/10/2016. FINDINGS: Cardiovascular: A right Port-A-Cath which terminates at the mid SVC. Advanced aortic and branch vessel atherosclerosis. Normal heart size, without pericardial effusion. Multivessel coronary artery atherosclerosis. No central pulmonary embolism, on this non-dedicated study. Mediastinum/Nodes: Right-sided thyroid nodule measures 9 mm and is nonspecific. Mediastinal edema, without well-defined adenopathy. No hilar adenopathy. Lungs/Pleura: Circumferential right-sided pleural fluid and thickening are similar. Areas of hyperattenuation along the parietal pleural surface, including on image 108/series 2, suspicious for pleural metastasis. The most masslike area measures on the order of 12 mm. Moderate left pleural effusion is similar and layers posteriorly. No complicating factors identified. Irregular right-sided septal thickening with areas of peribronchovascular consolidation. Slightly progressive and highly suspicious for lymphangitic tumor spread. Example at the right apex on image 42/series 3. Pulmonary nodules are most apparent on the left. An index left lower lobe nodule measures 10 mm on image 73/series 3 versus 6 mm on the prior exam (when remeasured). Index left upper lobe 6 mm nodule on image 78/series 3 measured 4 mm on the prior exam (when remeasured). Upper Abdomen:  Normal imaged portions of the liver, spleen, pancreas, adrenal glands, right kidney. Gastric underdistention. Indeterminate upper pole left renal lesion measures 1.9 cm and is incompletely imaged but grossly similar in size to on the prior exam. Musculoskeletal: No acute osseous abnormality. IMPRESSION: 1. Since 10/10/2016, disease progression. 2. similar left larger than right pleural effusions. The right-sided pleural effusion is complex, with areas of pleural nodularity, highly suspicious for malignant effusion. 3. Progression of right-sided pulmonary process which is suspicious for lymphangitic tumor spread. 4. Progressive pulmonary nodules/metastasis. 5.  Coronary artery atherosclerosis. Aortic atherosclerosis. 6. Complex left renal lesion is incompletely imaged but grossly similar in size to on the prior exam. Electronically Signed   By: KAbigail MiyamotoM.D.   On: 12/01/2016 08:19  ONCOLOGY HISTORY: Stage IV ([place] NX M1) right lung non-small cell lung cancer (adenocarcinoma) with malignant right pleural effusion, lymph node and left lung metastasis. On 12/01/14 - Right Pleural Fluid Cytology. POSITIVE FOR MALIGNANT CELLS. ADENOCARCINOMA, CONSISTENT WITH LUNG ORIGIN. Molecular testing for EGFR, ALK and others is negative. Patient initially was started on carboplatinum and pemetrexed and completed 6 cycles on April 06, 2015. She was then switched to maintenance pemetrexed until December 15, 2015 at which time she was noted to have progressive disease. Patient then received nivolumab from Jan 18, 2016 through March 28, 2016 and then had progressive disease. She then received single agent Taxotere April 18, 2016 through October 03, 2016. Patient initiated single agent Abraxane on October 30, 2016 which was discontinued after 3 cycles on November 21, 2016 for progression of disease. Patient initiated vinorelbine and gemcitabine on December 12, 2016.   ASSESSMENT: Stage IV right lung non-small cell carcinoma with malignant  pleural effusion and bilateral lung metastasis.  PLAN:  1. 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 Hospice and end-of-life her once again discussed with patient, but she is declining at this time and wishes to pursue additional treatment. Patient will receive dose reduced gemcitabine and vinorelbine on day 1 and 8 with day 15 off if her blood counts can tolerate. Proceed with cycle 1, day 1. Return to clinic in 1 week for consideration of cycle 1, day 8.. 2. Headaches: MRI the brain completed on March 03, 2016 is essentially unchanged from previous. 3.  Pain (back, hip, arm): MRI and bone scan results reviewed independently revealing metastatic disease in C6 there is likely causing her symptoms. Lumbar MRI revealed an enhancing lesion and L2 as well as a suspicious 12 mm lesion and right sacrum. Patient has completed XRT. Continue gabapentin 300 mg 3 times per day. Continue 10 mg oxycodone as needed.  4.  Kidney lesion: Unclear if metastatic deposit or second primary of the left kidney. Continue to monitor closely. 5.  Anxiety: Resolved. Continue Xanax 1 mg at night and 0.5 mg as needed during the day. 6.  Hypokalemia: Patient's potassium within normal limits. Continue oral potassium supplementation. 7.  Bony lesions: Consider Zometa in the future. 8.  Peripheral edema: Continue Lasix PRN. Will use with caution given patient's history of dehydration. 9.  Shortness of breath: Improved with Decadron. CT as above with progression of disease. 10. Facial swelling: Unclear etiology. Steroids as above. Monitor.  Patient expressed understanding and was in agreement with this plan. She also understands that She can call clinic at any time with any questions, concerns, or complaints.   Cancer Staging Metastatic lung cancer (metastasis from lung to other  site) [facility]) Staging form: Lung, AJCC 7th Edition - Clinical: Stage Unknown ([place], NX, M1) - Signed by Evlyn Kanner, NP on 01/21/2015   Lloyd Huger, MD   12/18/2016 11:07 PM

## 2016-12-12 ENCOUNTER — Inpatient Hospital Stay: Payer: BLUE CROSS/BLUE SHIELD

## 2016-12-12 ENCOUNTER — Inpatient Hospital Stay (HOSPITAL_BASED_OUTPATIENT_CLINIC_OR_DEPARTMENT_OTHER): Payer: BLUE CROSS/BLUE SHIELD | Admitting: Oncology

## 2016-12-12 VITALS — BP 104/79 | HR 96 | Temp 96.0°F | Resp 18 | Wt 174.0 lb

## 2016-12-12 DIAGNOSIS — N289 Disorder of kidney and ureter, unspecified: Secondary | ICD-10-CM

## 2016-12-12 DIAGNOSIS — R531 Weakness: Secondary | ICD-10-CM

## 2016-12-12 DIAGNOSIS — C3491 Malignant neoplasm of unspecified part of right bronchus or lung: Secondary | ICD-10-CM

## 2016-12-12 DIAGNOSIS — R5383 Other fatigue: Secondary | ICD-10-CM | POA: Diagnosis not present

## 2016-12-12 DIAGNOSIS — E041 Nontoxic single thyroid nodule: Secondary | ICD-10-CM

## 2016-12-12 DIAGNOSIS — Z87891 Personal history of nicotine dependence: Secondary | ICD-10-CM

## 2016-12-12 DIAGNOSIS — Z8049 Family history of malignant neoplasm of other genital organs: Secondary | ICD-10-CM

## 2016-12-12 DIAGNOSIS — C7802 Secondary malignant neoplasm of left lung: Secondary | ICD-10-CM

## 2016-12-12 DIAGNOSIS — I10 Essential (primary) hypertension: Secondary | ICD-10-CM

## 2016-12-12 DIAGNOSIS — G910 Communicating hydrocephalus: Secondary | ICD-10-CM

## 2016-12-12 DIAGNOSIS — Z923 Personal history of irradiation: Secondary | ICD-10-CM | POA: Diagnosis not present

## 2016-12-12 DIAGNOSIS — K219 Gastro-esophageal reflux disease without esophagitis: Secondary | ICD-10-CM

## 2016-12-12 DIAGNOSIS — C7951 Secondary malignant neoplasm of bone: Secondary | ICD-10-CM | POA: Diagnosis not present

## 2016-12-12 DIAGNOSIS — I119 Hypertensive heart disease without heart failure: Secondary | ICD-10-CM

## 2016-12-12 DIAGNOSIS — E876 Hypokalemia: Secondary | ICD-10-CM

## 2016-12-12 DIAGNOSIS — R5381 Other malaise: Secondary | ICD-10-CM | POA: Diagnosis not present

## 2016-12-12 DIAGNOSIS — Z9221 Personal history of antineoplastic chemotherapy: Secondary | ICD-10-CM

## 2016-12-12 DIAGNOSIS — F419 Anxiety disorder, unspecified: Secondary | ICD-10-CM

## 2016-12-12 DIAGNOSIS — E7800 Pure hypercholesterolemia, unspecified: Secondary | ICD-10-CM

## 2016-12-12 DIAGNOSIS — R600 Localized edema: Secondary | ICD-10-CM | POA: Diagnosis not present

## 2016-12-12 DIAGNOSIS — Z7952 Long term (current) use of systemic steroids: Secondary | ICD-10-CM

## 2016-12-12 DIAGNOSIS — R0609 Other forms of dyspnea: Secondary | ICD-10-CM

## 2016-12-12 DIAGNOSIS — I517 Cardiomegaly: Secondary | ICD-10-CM

## 2016-12-12 DIAGNOSIS — Z79899 Other long term (current) drug therapy: Secondary | ICD-10-CM

## 2016-12-12 DIAGNOSIS — C3481 Malignant neoplasm of overlapping sites of right bronchus and lung: Secondary | ICD-10-CM

## 2016-12-12 LAB — CBC WITH DIFFERENTIAL/PLATELET
Basophils Absolute: 0 10*3/uL (ref 0–0.1)
Basophils Relative: 0 %
Eosinophils Absolute: 0.2 10*3/uL (ref 0–0.7)
Eosinophils Relative: 1 %
HEMATOCRIT: 35.6 % (ref 35.0–47.0)
HEMOGLOBIN: 11.6 g/dL — AB (ref 12.0–16.0)
LYMPHS ABS: 0.7 10*3/uL — AB (ref 1.0–3.6)
LYMPHS PCT: 4 %
MCH: 27.1 pg (ref 26.0–34.0)
MCHC: 32.6 g/dL (ref 32.0–36.0)
MCV: 83.3 fL (ref 80.0–100.0)
MONO ABS: 0.5 10*3/uL (ref 0.2–0.9)
MONOS PCT: 3 %
NEUTROS ABS: 14.4 10*3/uL — AB (ref 1.4–6.5)
NEUTROS PCT: 92 %
Platelets: 274 10*3/uL (ref 150–440)
RBC: 4.28 MIL/uL (ref 3.80–5.20)
RDW: 17.6 % — AB (ref 11.5–14.5)
WBC: 15.7 10*3/uL — ABNORMAL HIGH (ref 3.6–11.0)

## 2016-12-12 LAB — COMPREHENSIVE METABOLIC PANEL
ALBUMIN: 2.6 g/dL — AB (ref 3.5–5.0)
ALK PHOS: 35 U/L — AB (ref 38–126)
ALT: 22 U/L (ref 14–54)
ANION GAP: 5 (ref 5–15)
AST: 16 U/L (ref 15–41)
BILIRUBIN TOTAL: 0.6 mg/dL (ref 0.3–1.2)
BUN: 22 mg/dL — AB (ref 6–20)
CALCIUM: 8.5 mg/dL — AB (ref 8.9–10.3)
CO2: 31 mmol/L (ref 22–32)
Chloride: 101 mmol/L (ref 101–111)
Creatinine, Ser: 0.63 mg/dL (ref 0.44–1.00)
GFR calc Af Amer: >60 mL/min (ref >60)
GLUCOSE: 111 mg/dL — AB (ref 65–99)
POTASSIUM: 3.9 mmol/L (ref 3.5–5.1)
Sodium: 137 mmol/L (ref 135–145)
TOTAL PROTEIN: 5.5 g/dL — AB (ref 6.5–8.1)

## 2016-12-12 MED ORDER — VINORELBINE TARTRATE CHEMO INJECTION 50 MG/5ML
18.0000 mg/m2 | Freq: Once | INTRAVENOUS | Status: AC
  Administered 2016-12-12: 34 mg via INTRAVENOUS
  Filled 2016-12-12: qty 3.4

## 2016-12-12 MED ORDER — PROCHLORPERAZINE MALEATE 10 MG PO TABS
10.0000 mg | ORAL_TABLET | Freq: Once | ORAL | Status: AC
  Administered 2016-12-12: 10 mg via ORAL
  Filled 2016-12-12: qty 1

## 2016-12-12 MED ORDER — SODIUM CHLORIDE 0.9 % IV SOLN
INTRAVENOUS | Status: DC
Start: 2016-12-12 — End: 2016-12-12
  Administered 2016-12-12: 15:00:00 via INTRAVENOUS
  Filled 2016-12-12: qty 1000

## 2016-12-12 MED ORDER — HEPARIN SOD (PORK) LOCK FLUSH 100 UNIT/ML IV SOLN
500.0000 [IU] | Freq: Once | INTRAVENOUS | Status: AC | PRN
  Administered 2016-12-12: 500 [IU]
  Filled 2016-12-12: qty 5

## 2016-12-12 MED ORDER — SODIUM CHLORIDE 0.9 % IV SOLN
800.0000 mg/m2 | Freq: Once | INTRAVENOUS | Status: AC
  Administered 2016-12-12: 1520 mg via INTRAVENOUS
  Filled 2016-12-12: qty 24.22

## 2016-12-12 NOTE — Progress Notes (Signed)
Patient is here for follow up, she mentioned bad chest and back paint earlier. She has swelling in right eye.

## 2016-12-18 NOTE — Progress Notes (Signed)
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  INTERVAL HISTORY: Patient returns to clinic today for further evaluation and initiation of cycle 1, day 8 of gemcitabine and vinorelbine. She tolerated her first treatment well. She does not complain of chest pain today, but still has occasional shortness of breath. Her facial swelling has resolved. She continues to have persistent weakness and fatigue. She continues to have chronic back pain. She does not complain of headache today. She denies any recent fevers. Her appetite has mildly improved. She denies any nausea, vomiting, constipation, or diarrhea. She complains of frequent urination. Patient offers no further specific complaints today.  REVIEW OF SYSTEMS:   Review of Systems  Constitutional: Positive for malaise/fatigue. Negative for fever and weight loss.  HENT: Negative.   Eyes: Negative.   Respiratory: Positive for cough and shortness of breath. Negative for hemoptysis.   Cardiovascular: Negative for chest pain and leg swelling.  Gastrointestinal: Negative for abdominal pain and nausea.  Genitourinary: Negative.   Musculoskeletal: Positive for back pain.  Neurological: Positive for weakness.  Psychiatric/Behavioral: Negative.  Negative for depression. The patient is not nervous/anxious.     As per HPI. Otherwise, a complete review of systems is negative.  PAST MEDICAL HISTORY: Past Medical History:  Diagnosis Date  . Anemia 01/21/2015  . 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 Anxiety   . GERD (gastroesophageal reflux disease)   . Hypercholesteremia   . Hypertension   . Last menstrual period (LMP) > 10 days ago 2007  . Lung  cancer ([place])   . Metastatic lung cancer (metastasis from lung to other site) [facility]) 01/21/2015    PAST SURGICAL HISTORY: Past Surgical History:  Procedure Laterality Date  . ABDOMINAL HYSTERECTOMY     partial  . INSERTION / PLACEMENT PLEURAL CATHETER    . PORTACATH PLACEMENT Right 04/26/2015   Procedure: INSERTION PORT-A-CATH;  Surgeon: Nestor Lewandowsky, MD;  Location: [facility] ORS;  Service: General;  Laterality: Right;    FAMILY HISTORY: Family History  Problem Relation Age of Onset  . Cervical cancer Mother   . Uterine cancer Maternal Grandmother     ADVANCED DIRECTIVES (Y/N):  N  HEALTH MAINTENANCE: Social History  Substance Use Topics  . Smoking status: Former Smoker    Packs/day: 0.25    Years: 8.00    Types: Cigarettes    Quit date: 07/20/2014  . Smokeless tobacco: Never Used  . Alcohol use No     Colonoscopy:  PAP:  Bone density:  Lipid panel:  Allergies  Allergen Reactions  . Tegaderm Ag Mesh [Silver] Other (See Comments)    blisters  . Aspirin Other (See Comments)    GIB    Current Outpatient Prescriptions  Medication Sig Dispense Refill  . ALPRAZolam (XANAX) 1 MG tablet TAKE 1/2 BY MOUTH 1 TO 2 TIMES DAILY AS NEEDED FOR ANXIETY AND TAKE 1 BY MOUTH AT NIGHT FOR ANXIETY/SLEEP 45 tablet 0  . dexamethasone (DECADRON) 4 MG tablet Take 1 tablet (4 mg total) by mouth daily. 30 tablet 0  . docusate sodium (COLACE) 100 MG capsule Take 100 mg by mouth 2 (two) times daily as needed for mild constipation.    . furosemide (LASIX) 20 MG tablet Take 1 tablet (20  mg total) by mouth daily. 30 tablet 2  . gabapentin (NEURONTIN) 300 MG capsule Take 1 capsule (300 mg total) by mouth 3 (three) times daily. 270 capsule 3  . lidocaine-prilocaine (EMLA) cream Apply cream 1 hour before chemotherapy treatment 30 g 1  . lisinopril-hydrochlorothiazide (PRINZIDE,ZESTORETIC) 10-12.5 MG per tablet Take 1 tablet by mouth daily.    [name] loratadine (CLARITIN) 10 MG tablet Take 10 mg by mouth daily.     . methylPREDNISolone (MEDROL DOSEPAK) 4 MG TBPK tablet Take six tablets on day one, five tablets on day two, four on day three, three on day four, two on day five, take one on day six. Then stop. 21 tablet 0  . Multiple Vitamins-Minerals (MULTIVITAMIN WITH MINERALS) tablet Take 1 tablet by mouth daily. 30 tablet 3  . omeprazole (PRILOSEC) 20 MG capsule Take 1 capsule (20 mg total) by mouth daily. 30 capsule 0  . oxyCODONE (OXY IR/ROXICODONE) 5 MG immediate release tablet Take 1 tablet (5 mg total) by mouth 3 (three) times daily as needed for severe pain. 90 tablet 0  . potassium chloride SA (K-DUR,KLOR-CON) 20 MEQ tablet Take 1 tablet (20 mEq total) by mouth daily. 90 tablet 1  . promethazine (PHENERGAN) 25 MG tablet Take 1 tablet (25 mg total) by mouth every 6 (six) hours as needed for nausea or vomiting. 30 tablet 0  . simvastatin (ZOCOR) 20 MG tablet Take 20 mg by mouth at bedtime.     . traMADol (ULTRAM) 50 MG tablet Take 1 tablet (50 mg total) by mouth every 8 (eight) hours as needed for moderate pain. 60 tablet 0  . vitamin B-12 (CYANOCOBALAMIN) 1000 MCG tablet Take 1,000 mcg by mouth daily.     No current facility-administered medications for this visit.    Facility-Administered Medications Ordered in Other Visits  Medication Dose Route Frequency Provider Last Rate Last Dose  . heparin lock flush 100 unit/mL  250 Units Intracatheter PRN Evlyn Kanner, NP      . sodium chloride 0.9 % injection 10 mL  10 mL Intracatheter PRN Evlyn Kanner, NP      . sodium chloride 0.9 % injection 10 mL  10 mL Intravenous PRN Leia Alf, MD   10 mL at 04/27/15 0905  . sodium chloride 0.9 % injection 3 mL  3 mL Intracatheter PRN Evlyn Kanner, NP        OBJECTIVE: Vitals:   12/19/16 1106  BP: (!) 144/80  Pulse: 92  Resp: 18  Temp: (!) 96.5 F (35.8 C)     Body mass index is 28.32 kg/m.    ECOG FS:2 - Symptomatic, <50% confined to bed  General: Well-developed, well-nourished, no  acute distress. HEENT: Swelling of right side of his resolved. Eyes: Pink conjunctiva, anicteric sclera. Lungs: Diminished breath sounds bilaterally. Heart: Regular rate and rhythm. No rubs, murmurs, or gallops. Abdomen: Soft, nontender, nondistended. No organomegaly noted, normoactive bowel sounds. Musculoskeletal: No edema, cyanosis, or clubbing. Neuro: Alert, answering all questions appropriately. Cranial nerves grossly intact. Skin: No rashes or petechiae noted. Psych: Normal affect.   LAB RESULTS:  Lab Results  Component Value Date   NA 136 12/19/2016   K 3.6 12/19/2016   CL 102 12/19/2016   CO2 27 12/19/2016   GLUCOSE 137 (H) 12/19/2016   BUN 20 12/19/2016   CREATININE 0.71 12/19/2016   CALCIUM 8.5 (L) 12/19/2016   PROT 5.4 (L) 12/19/2016   ALBUMIN 2.7 (L) 12/19/2016   AST 15 12/19/2016  ALT 17 12/19/2016   ALKPHOS 37 (L) 12/19/2016   BILITOT 0.5 12/19/2016   GFRNONAA >60 12/19/2016   GFRAA >60 12/19/2016    Lab Results  Component Value Date   WBC 7.9 12/19/2016   NEUTROABS 7.1 (H) 12/19/2016   HGB 11.5 (L) 12/19/2016   HCT 34.9 (L) 12/19/2016   MCV 83.3 12/19/2016   PLT 138 (L) 12/19/2016     STUDIES: Dg Chest 2 View  Result Date: 11/28/2016 CLINICAL DATA:  Stage IV lung cancer . EXAM: CHEST  2 VIEW COMPARISON:  10/26/2016. FINDINGS: Port-A-Cath noted with tip over the superior vena cava. Cardiomegaly. Diffuse bilateral nodular pulmonary interstitial prominence with prominent right pleural effusion and small left pleural effusion noted. These changes could be related to malignancy. Active pneumonitis cannot be excluded. No pneumothorax. No acute bony abnormality . IMPRESSION: 1. Port-A-Cath in stable position. 2. Diffuse bilateral nodular pulmonary interstitial prominence with bilateral pleural effusions, right side greater than left again noted. These changes could be malignant. Active pneumonitis cannot be excluded. No change from prior exam. Electronically  Signed   By: Marcello Moores  Register   On: 11/28/2016 11:09   Ct Chest W Contrast  Result Date: 12/01/2016 CLINICAL DATA:  Right-sided lung cancer with metastasis. Worsening shortness of breath. Left thoracentesis 3 weeks ago. Status post radiation therapy and chemotherapy. EXAM: CT CHEST WITH CONTRAST TECHNIQUE: Multidetector CT imaging of the chest was performed during intravenous contrast administration. CONTRAST:  56m ISOVUE-300 IOPAMIDOL (ISOVUE-300) INJECTION 61% COMPARISON:  Plain films 11/28/2016. PET 10/18/2016. Chest CT 10/10/2016. FINDINGS: Cardiovascular: A right Port-A-Cath which terminates at the mid SVC. Advanced aortic and branch vessel atherosclerosis. Normal heart size, without pericardial effusion. Multivessel coronary artery atherosclerosis. No central pulmonary embolism, on this non-dedicated study. Mediastinum/Nodes: Right-sided thyroid nodule measures 9 mm and is nonspecific. Mediastinal edema, without well-defined adenopathy. No hilar adenopathy. Lungs/Pleura: Circumferential right-sided pleural fluid and thickening are similar. Areas of hyperattenuation along the parietal pleural surface, including on image 108/series 2, suspicious for pleural metastasis. The most masslike area measures on the order of 12 mm. Moderate left pleural effusion is similar and layers posteriorly. No complicating factors identified. Irregular right-sided septal thickening with areas of peribronchovascular consolidation. Slightly progressive and highly suspicious for lymphangitic tumor spread. Example at the right apex on image 42/series 3. Pulmonary nodules are most apparent on the left. An index left lower lobe nodule measures 10 mm on image 73/series 3 versus 6 mm on the prior exam (when remeasured). Index left upper lobe 6 mm nodule on image 78/series 3 measured 4 mm on the prior exam (when remeasured). Upper Abdomen: Normal imaged portions of the liver, spleen, pancreas, adrenal glands, right kidney. Gastric  underdistention. Indeterminate upper pole left renal lesion measures 1.9 cm and is incompletely imaged but grossly similar in size to on the prior exam. Musculoskeletal: No acute osseous abnormality. IMPRESSION: 1. Since 10/10/2016, disease progression. 2. similar left larger than right pleural effusions. The right-sided pleural effusion is complex, with areas of pleural nodularity, highly suspicious for malignant effusion. 3. Progression of right-sided pulmonary process which is suspicious for lymphangitic tumor spread. 4. Progressive pulmonary nodules/metastasis. 5.  Coronary artery atherosclerosis. Aortic atherosclerosis. 6. Complex left renal lesion is incompletely imaged but grossly similar in size to on the prior exam. Electronically Signed   By: KAbigail MiyamotoM.D.   On: 12/01/2016 08:19    ONCOLOGY HISTORY: Stage IV (TMilanNX M1) right lung non-small cell lung cancer (adenocarcinoma) with malignant right pleural effusion, lymph node  and left lung metastasis. On 12/01/14 - Right Pleural Fluid Cytology. POSITIVE FOR MALIGNANT CELLS. ADENOCARCINOMA, CONSISTENT WITH LUNG ORIGIN. Molecular testing for EGFR, ALK and others is negative. Patient initially was started on carboplatinum and pemetrexed and completed 6 cycles on April 06, 2015. She was then switched to maintenance pemetrexed until December 15, 2015 at which time she was noted to have progressive disease. Patient then received nivolumab from Jan 18, 2016 through March 28, 2016 and then had progressive disease. She then received single agent Taxotere April 18, 2016 through October 03, 2016. Patient initiated single agent Abraxane on October 30, 2016 which was discontinued after 3 cycles on November 21, 2016 for progression of disease. Patient initiated vinorelbine and gemcitabine on December 12, 2016.   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  PLAN:  1. Stage IV right lung non-small  cell carcinoma with malignant pleural effusion and bilateral lung metastasis:  CT scan results from December 01, 2016 reviewed independently with progression of disease. Hospice and end-of-life her once again discussed with patient, but she is declining at this time and wishes to pursue additional treatment. Patient will receive dose reduced gemcitabine and vinorelbine on day 1 and 8 with day 15 off if her blood counts can tolerate. Proceed with cycle 1, day 8. Return to clinic in 2 weeks for consideration of cycle 2, day 1. 2. Headaches: MRI the brain completed on March 03, 2016 is essentially unchanged from previous. 3.  Pain (back, hip, arm): MRI and bone scan results reviewed independently revealing metastatic disease in C6 there is likely causing her symptoms. Lumbar MRI revealed an enhancing lesion and L2 as well as a suspicious 12 mm lesion and right sacrum. Patient has completed XRT. Continue gabapentin 300 mg 3 times per day. Continue 10 mg oxycodone as needed.  4.  Kidney lesion: Unclear if metastatic deposit or second primary of the left kidney. Continue to monitor closely. 5.  Anxiety: Resolved. Continue Xanax 1 mg at night and 0.5 mg as needed during the day. 6.  Hypokalemia: Patient's potassium within normal limits. Continue oral potassium supplementation. 7.  Bony lesions: Consider Zometa in the future. 8.  Peripheral edema: Continue Lasix PRN. Will use with caution given patient's history of dehydration. 9.  Shortness of breath: Improved with Decadron. CT as above with progression of disease. 10. Facial swelling: Resolved. 11. Home health needs: Given patient's persistent weakness and fatigue as well as declining performance status have recommended home health evaluation for assistance.   Patient expressed understanding and was in agreement with this plan. She also understands that She can call clinic at any time with any questions, concerns, or complaints.   Cancer Staging Metastatic  lung cancer (metastasis from lung to other site) [facility]) Staging form: Lung, AJCC 7th Edition - Clinical: Stage Unknown ([place], NX, M1) - Signed by Evlyn Kanner, NP on 01/21/2015   Lloyd Huger, MD   12/22/2016 3:00 PM

## 2016-12-19 ENCOUNTER — Inpatient Hospital Stay: Payer: BLUE CROSS/BLUE SHIELD

## 2016-12-19 ENCOUNTER — Inpatient Hospital Stay (HOSPITAL_BASED_OUTPATIENT_CLINIC_OR_DEPARTMENT_OTHER): Payer: BLUE CROSS/BLUE SHIELD | Admitting: Oncology

## 2016-12-19 ENCOUNTER — Inpatient Hospital Stay: Payer: BLUE CROSS/BLUE SHIELD | Attending: Oncology

## 2016-12-19 VITALS — BP 144/80 | HR 92 | Temp 96.5°F | Resp 18 | Wt 170.2 lb

## 2016-12-19 DIAGNOSIS — J910 Malignant pleural effusion: Secondary | ICD-10-CM

## 2016-12-19 DIAGNOSIS — C7802 Secondary malignant neoplasm of left lung: Secondary | ICD-10-CM | POA: Insufficient documentation

## 2016-12-19 DIAGNOSIS — Z923 Personal history of irradiation: Secondary | ICD-10-CM | POA: Diagnosis not present

## 2016-12-19 DIAGNOSIS — R0602 Shortness of breath: Secondary | ICD-10-CM | POA: Insufficient documentation

## 2016-12-19 DIAGNOSIS — M549 Dorsalgia, unspecified: Secondary | ICD-10-CM

## 2016-12-19 DIAGNOSIS — Z9221 Personal history of antineoplastic chemotherapy: Secondary | ICD-10-CM

## 2016-12-19 DIAGNOSIS — R5383 Other fatigue: Secondary | ICD-10-CM | POA: Diagnosis not present

## 2016-12-19 DIAGNOSIS — C7951 Secondary malignant neoplasm of bone: Secondary | ICD-10-CM

## 2016-12-19 DIAGNOSIS — R600 Localized edema: Secondary | ICD-10-CM

## 2016-12-19 DIAGNOSIS — Z87891 Personal history of nicotine dependence: Secondary | ICD-10-CM

## 2016-12-19 DIAGNOSIS — F419 Anxiety disorder, unspecified: Secondary | ICD-10-CM

## 2016-12-19 DIAGNOSIS — N289 Disorder of kidney and ureter, unspecified: Secondary | ICD-10-CM | POA: Insufficient documentation

## 2016-12-19 DIAGNOSIS — C3491 Malignant neoplasm of unspecified part of right bronchus or lung: Secondary | ICD-10-CM | POA: Insufficient documentation

## 2016-12-19 DIAGNOSIS — Z8049 Family history of malignant neoplasm of other genital organs: Secondary | ICD-10-CM | POA: Diagnosis not present

## 2016-12-19 DIAGNOSIS — Z79899 Other long term (current) drug therapy: Secondary | ICD-10-CM

## 2016-12-19 DIAGNOSIS — R5381 Other malaise: Secondary | ICD-10-CM | POA: Insufficient documentation

## 2016-12-19 DIAGNOSIS — Z5111 Encounter for antineoplastic chemotherapy: Secondary | ICD-10-CM | POA: Insufficient documentation

## 2016-12-19 DIAGNOSIS — I517 Cardiomegaly: Secondary | ICD-10-CM

## 2016-12-19 DIAGNOSIS — G8929 Other chronic pain: Secondary | ICD-10-CM

## 2016-12-19 DIAGNOSIS — E7800 Pure hypercholesterolemia, unspecified: Secondary | ICD-10-CM | POA: Insufficient documentation

## 2016-12-19 DIAGNOSIS — R05 Cough: Secondary | ICD-10-CM

## 2016-12-19 DIAGNOSIS — I119 Hypertensive heart disease without heart failure: Secondary | ICD-10-CM | POA: Diagnosis not present

## 2016-12-19 DIAGNOSIS — R531 Weakness: Secondary | ICD-10-CM

## 2016-12-19 DIAGNOSIS — K219 Gastro-esophageal reflux disease without esophagitis: Secondary | ICD-10-CM

## 2016-12-19 LAB — COMPREHENSIVE METABOLIC PANEL
ALT: 17 U/L (ref 14–54)
AST: 15 U/L (ref 15–41)
Albumin: 2.7 g/dL — ABNORMAL LOW (ref 3.5–5.0)
Alkaline Phosphatase: 37 U/L — ABNORMAL LOW (ref 38–126)
Anion gap: 7 (ref 5–15)
BUN: 20 mg/dL (ref 6–20)
CHLORIDE: 102 mmol/L (ref 101–111)
CO2: 27 mmol/L (ref 22–32)
CREATININE: 0.71 mg/dL (ref 0.44–1.00)
Calcium: 8.5 mg/dL — ABNORMAL LOW (ref 8.9–10.3)
GFR calc Af Amer: >60 mL/min (ref >60)
GFR calc non Af Amer: >60 mL/min (ref >60)
Glucose, Bld: 137 mg/dL — ABNORMAL HIGH (ref 65–99)
Potassium: 3.6 mmol/L (ref 3.5–5.1)
SODIUM: 136 mmol/L (ref 135–145)
Total Bilirubin: 0.5 mg/dL (ref 0.3–1.2)
Total Protein: 5.4 g/dL — ABNORMAL LOW (ref 6.5–8.1)

## 2016-12-19 LAB — CBC WITH DIFFERENTIAL/PLATELET
BASOS ABS: 0 10*3/uL (ref 0–0.1)
BASOS PCT: 0 %
EOS ABS: 0.1 10*3/uL (ref 0–0.7)
EOS PCT: 1 %
HCT: 34.9 % — ABNORMAL LOW (ref 35.0–47.0)
Hemoglobin: 11.5 g/dL — ABNORMAL LOW (ref 12.0–16.0)
Lymphocytes Relative: 6 %
Lymphs Abs: 0.5 10*3/uL — ABNORMAL LOW (ref 1.0–3.6)
MCH: 27.3 pg (ref 26.0–34.0)
MCHC: 32.8 g/dL (ref 32.0–36.0)
MCV: 83.3 fL (ref 80.0–100.0)
Monocytes Absolute: 0.2 10*3/uL (ref 0.2–0.9)
Monocytes Relative: 2 %
Neutro Abs: 7.1 10*3/uL — ABNORMAL HIGH (ref 1.4–6.5)
Neutrophils Relative %: 91 %
PLATELETS: 138 10*3/uL — AB (ref 150–440)
RBC: 4.19 MIL/uL (ref 3.80–5.20)
RDW: 17.2 % — ABNORMAL HIGH (ref 11.5–14.5)
WBC: 7.9 10*3/uL (ref 3.6–11.0)

## 2016-12-19 MED ORDER — HEPARIN SOD (PORK) LOCK FLUSH 100 UNIT/ML IV SOLN
500.0000 [IU] | Freq: Once | INTRAVENOUS | Status: AC | PRN
  Administered 2016-12-19: 500 [IU]
  Filled 2016-12-19: qty 5

## 2016-12-19 MED ORDER — PROCHLORPERAZINE MALEATE 10 MG PO TABS
10.0000 mg | ORAL_TABLET | Freq: Once | ORAL | Status: AC
  Administered 2016-12-19: 10 mg via ORAL
  Filled 2016-12-19: qty 1

## 2016-12-19 MED ORDER — SODIUM CHLORIDE 0.9 % IV SOLN
800.0000 mg/m2 | Freq: Once | INTRAVENOUS | Status: AC
  Administered 2016-12-19: 1520 mg via INTRAVENOUS
  Filled 2016-12-19: qty 40

## 2016-12-19 MED ORDER — SODIUM CHLORIDE 0.9% FLUSH
10.0000 mL | INTRAVENOUS | Status: DC | PRN
  Administered 2016-12-19: 10 mL
  Filled 2016-12-19: qty 10

## 2016-12-19 MED ORDER — VINORELBINE TARTRATE CHEMO INJECTION 50 MG/5ML
18.0000 mg/m2 | Freq: Once | INTRAVENOUS | Status: AC
  Administered 2016-12-19: 34 mg via INTRAVENOUS
  Filled 2016-12-19: qty 3.4

## 2016-12-19 MED ORDER — SODIUM CHLORIDE 0.9 % IV SOLN
INTRAVENOUS | Status: DC
  Administered 2016-12-19: 12:00:00 via INTRAVENOUS
  Filled 2016-12-19: qty 1000

## 2016-12-19 NOTE — Progress Notes (Signed)
Complains of intermittent chest pain x 1 week. Having frequent urination last night. Had to urinate every 1 hour per patient. Chest pain has resolved today.

## 2016-12-21 ENCOUNTER — Telehealth: Payer: Self-pay | Admitting: *Deleted

## 2016-12-21 NOTE — Telephone Encounter (Signed)
Requesting last office note be addended to include need for home health services and refaxed to them.

## 2016-12-22 NOTE — Telephone Encounter (Signed)
Please addend the last note so I can fax back to them. Thanks

## 2016-12-22 NOTE — Telephone Encounter (Signed)
Note completed 

## 2016-12-24 ENCOUNTER — Other Ambulatory Visit: Payer: Self-pay | Admitting: Oncology

## 2016-12-24 ENCOUNTER — Encounter: Payer: Self-pay | Admitting: Specialist

## 2016-12-24 ENCOUNTER — Emergency Department: Payer: BLUE CROSS/BLUE SHIELD

## 2016-12-24 ENCOUNTER — Inpatient Hospital Stay
Admission: EM | Admit: 2016-12-24 | Discharge: 2016-12-26 | DRG: 600 | Disposition: A | Discharge status: Home Health | Payer: BLUE CROSS/BLUE SHIELD | Attending: Internal Medicine | Admitting: Internal Medicine

## 2016-12-24 DIAGNOSIS — Z87891 Personal history of nicotine dependence: Secondary | ICD-10-CM

## 2016-12-24 DIAGNOSIS — J910 Malignant pleural effusion: Secondary | ICD-10-CM | POA: Diagnosis present

## 2016-12-24 DIAGNOSIS — Z823 Family history of stroke: Secondary | ICD-10-CM

## 2016-12-24 DIAGNOSIS — R109 Unspecified abdominal pain: Secondary | ICD-10-CM | POA: Diagnosis not present

## 2016-12-24 DIAGNOSIS — D701 Agranulocytosis secondary to cancer chemotherapy: Secondary | ICD-10-CM | POA: Diagnosis not present

## 2016-12-24 DIAGNOSIS — N644 Mastodynia: Secondary | ICD-10-CM

## 2016-12-24 DIAGNOSIS — C7802 Secondary malignant neoplasm of left lung: Secondary | ICD-10-CM | POA: Diagnosis present

## 2016-12-24 DIAGNOSIS — D61810 Antineoplastic chemotherapy induced pancytopenia: Secondary | ICD-10-CM | POA: Diagnosis present

## 2016-12-24 DIAGNOSIS — I10 Essential (primary) hypertension: Secondary | ICD-10-CM | POA: Diagnosis present

## 2016-12-24 DIAGNOSIS — Z886 Allergy status to analgesic agent status: Secondary | ICD-10-CM

## 2016-12-24 DIAGNOSIS — N61 Mastitis without abscess: Principal | ICD-10-CM | POA: Diagnosis present

## 2016-12-24 DIAGNOSIS — K219 Gastro-esophageal reflux disease without esophagitis: Secondary | ICD-10-CM | POA: Diagnosis present

## 2016-12-24 DIAGNOSIS — R079 Chest pain, unspecified: Secondary | ICD-10-CM

## 2016-12-24 DIAGNOSIS — Z9221 Personal history of antineoplastic chemotherapy: Secondary | ICD-10-CM | POA: Diagnosis not present

## 2016-12-24 DIAGNOSIS — Z90710 Acquired absence of both cervix and uterus: Secondary | ICD-10-CM | POA: Diagnosis not present

## 2016-12-24 DIAGNOSIS — T451X5A Adverse effect of antineoplastic and immunosuppressive drugs, initial encounter: Secondary | ICD-10-CM | POA: Diagnosis present

## 2016-12-24 DIAGNOSIS — F419 Anxiety disorder, unspecified: Secondary | ICD-10-CM | POA: Diagnosis present

## 2016-12-24 DIAGNOSIS — T451X5S Adverse effect of antineoplastic and immunosuppressive drugs, sequela: Secondary | ICD-10-CM | POA: Diagnosis not present

## 2016-12-24 DIAGNOSIS — Z7952 Long term (current) use of systemic steroids: Secondary | ICD-10-CM | POA: Diagnosis not present

## 2016-12-24 DIAGNOSIS — Z85118 Personal history of other malignant neoplasm of bronchus and lung: Secondary | ICD-10-CM | POA: Diagnosis not present

## 2016-12-24 DIAGNOSIS — E785 Hyperlipidemia, unspecified: Secondary | ICD-10-CM | POA: Diagnosis present

## 2016-12-24 DIAGNOSIS — D6959 Other secondary thrombocytopenia: Secondary | ICD-10-CM | POA: Diagnosis present

## 2016-12-24 DIAGNOSIS — C7951 Secondary malignant neoplasm of bone: Secondary | ICD-10-CM | POA: Diagnosis not present

## 2016-12-24 DIAGNOSIS — R0602 Shortness of breath: Secondary | ICD-10-CM | POA: Diagnosis not present

## 2016-12-24 DIAGNOSIS — G629 Polyneuropathy, unspecified: Secondary | ICD-10-CM | POA: Diagnosis present

## 2016-12-24 DIAGNOSIS — Z8049 Family history of malignant neoplasm of other genital organs: Secondary | ICD-10-CM | POA: Diagnosis not present

## 2016-12-24 DIAGNOSIS — R05 Cough: Secondary | ICD-10-CM | POA: Diagnosis not present

## 2016-12-24 DIAGNOSIS — C7801 Secondary malignant neoplasm of right lung: Secondary | ICD-10-CM | POA: Diagnosis present

## 2016-12-24 DIAGNOSIS — Z9981 Dependence on supplemental oxygen: Secondary | ICD-10-CM

## 2016-12-24 DIAGNOSIS — Z888 Allergy status to other drugs, medicaments and biological substances status: Secondary | ICD-10-CM

## 2016-12-24 DIAGNOSIS — C3491 Malignant neoplasm of unspecified part of right bronchus or lung: Secondary | ICD-10-CM | POA: Diagnosis not present

## 2016-12-24 LAB — BASIC METABOLIC PANEL
Anion gap: 3 — ABNORMAL LOW (ref 5–15)
BUN: 24 mg/dL — AB (ref 6–20)
CHLORIDE: 102 mmol/L (ref 101–111)
CO2: 34 mmol/L — ABNORMAL HIGH (ref 22–32)
CREATININE: 0.58 mg/dL (ref 0.44–1.00)
Calcium: 8.4 mg/dL — ABNORMAL LOW (ref 8.9–10.3)
GFR calc Af Amer: >60 mL/min (ref >60)
Glucose, Bld: 100 mg/dL — ABNORMAL HIGH (ref 65–99)
Potassium: 3.4 mmol/L — ABNORMAL LOW (ref 3.5–5.1)
SODIUM: 139 mmol/L (ref 135–145)

## 2016-12-24 LAB — CBC WITH DIFFERENTIAL/PLATELET
BASOS PCT: 1 %
Basophils Absolute: 0 10*3/uL (ref 0–0.1)
EOS ABS: 0.1 10*3/uL (ref 0–0.7)
Eosinophils Relative: 3 %
HCT: 33.6 % — ABNORMAL LOW (ref 35.0–47.0)
HEMOGLOBIN: 10.6 g/dL — AB (ref 12.0–16.0)
Lymphocytes Relative: 30 %
Lymphs Abs: 0.9 10*3/uL — ABNORMAL LOW (ref 1.0–3.6)
MCH: 26.6 pg (ref 26.0–34.0)
MCHC: 31.7 g/dL — ABNORMAL LOW (ref 32.0–36.0)
MCV: 83.9 fL (ref 80.0–100.0)
Monocytes Absolute: 0 10*3/uL — ABNORMAL LOW (ref 0.2–0.9)
Monocytes Relative: 0 %
NEUTROS PCT: 66 %
Neutro Abs: 1.9 10*3/uL (ref 1.4–6.5)
Platelets: 72 10*3/uL — ABNORMAL LOW (ref 150–440)
RBC: 4 MIL/uL (ref 3.80–5.20)
RDW: 17.5 % — ABNORMAL HIGH (ref 11.5–14.5)
WBC: 2.8 10*3/uL — AB (ref 3.6–11.0)

## 2016-12-24 MED ORDER — LORATADINE 10 MG PO TABS: 10.0000 mg | ORAL_TABLET | Freq: Every day | ORAL | Status: DC | PRN

## 2016-12-24 MED ORDER — TRAMADOL HCL 50 MG PO TABS: 50.0000 mg | ORAL_TABLET | Freq: Three times a day (TID) | ORAL | Status: DC | PRN

## 2016-12-24 MED ORDER — DOCUSATE SODIUM 100 MG PO CAPS
100.0000 mg | ORAL_CAPSULE | Freq: Two times a day (BID) | ORAL | Status: DC | PRN
  Filled 2016-12-24: qty 1

## 2016-12-24 MED ORDER — POTASSIUM CHLORIDE CRYS ER 20 MEQ PO TBCR
20.0000 meq | EXTENDED_RELEASE_TABLET | Freq: Every day | ORAL | Status: DC
  Administered 2016-12-24 – 2016-12-26 (×3): 20 meq via ORAL
  Filled 2016-12-24 (×3): qty 1

## 2016-12-24 MED ORDER — ACETAMINOPHEN 650 MG RE SUPP: 650.0000 mg | Freq: Four times a day (QID) | RECTAL | Status: DC | PRN

## 2016-12-24 MED ORDER — HYDROCHLOROTHIAZIDE 12.5 MG PO CAPS
12.5000 mg | ORAL_CAPSULE | Freq: Every day | ORAL | Status: DC
  Administered 2016-12-24 – 2016-12-25 (×2): 12.5 mg via ORAL
  Filled 2016-12-24 (×2): qty 1

## 2016-12-24 MED ORDER — ONDANSETRON HCL 4 MG/2ML IJ SOLN: 4.0000 mg | Freq: Four times a day (QID) | INTRAMUSCULAR | Status: DC | PRN

## 2016-12-24 MED ORDER — ACETAMINOPHEN 325 MG PO TABS: 650.0000 mg | ORAL_TABLET | Freq: Four times a day (QID) | ORAL | Status: DC | PRN

## 2016-12-24 MED ORDER — SIMVASTATIN 20 MG PO TABS
20.0000 mg | ORAL_TABLET | Freq: Every day | ORAL | Status: DC
  Administered 2016-12-24 – 2016-12-25 (×2): 20 mg via ORAL
  Filled 2016-12-24 (×2): qty 1

## 2016-12-24 MED ORDER — GABAPENTIN 300 MG PO CAPS
300.0000 mg | ORAL_CAPSULE | Freq: Three times a day (TID) | ORAL | Status: DC
  Administered 2016-12-24 – 2016-12-26 (×5): 300 mg via ORAL
  Filled 2016-12-24 (×5): qty 1

## 2016-12-24 MED ORDER — ONDANSETRON HCL 4 MG PO TABS: 4.0000 mg | ORAL_TABLET | Freq: Four times a day (QID) | ORAL | Status: DC | PRN

## 2016-12-24 MED ORDER — DEXTROSE 5 % IV SOLN
2.0000 g | Freq: Once | INTRAVENOUS | Status: AC
  Administered 2016-12-24: 2 g via INTRAVENOUS
  Filled 2016-12-24: qty 2

## 2016-12-24 MED ORDER — ADULT MULTIVITAMIN W/MINERALS CH
1.0000 | ORAL_TABLET | Freq: Every day | ORAL | Status: DC
  Administered 2016-12-24 – 2016-12-26 (×3): 1 via ORAL
  Filled 2016-12-24 (×3): qty 1

## 2016-12-24 MED ORDER — FUROSEMIDE 20 MG PO TABS
20.0000 mg | ORAL_TABLET | Freq: Every day | ORAL | Status: DC
  Administered 2016-12-24 – 2016-12-25 (×2): 20 mg via ORAL
  Filled 2016-12-24 (×2): qty 1

## 2016-12-24 MED ORDER — ALPRAZOLAM 0.5 MG PO TABS
0.5000 mg | ORAL_TABLET | Freq: Two times a day (BID) | ORAL | Status: DC | PRN
  Filled 2016-12-24 (×2): qty 1

## 2016-12-24 MED ORDER — DEXAMETHASONE 4 MG PO TABS
4.0000 mg | ORAL_TABLET | Freq: Every day | ORAL | Status: DC
  Administered 2016-12-24 – 2016-12-26 (×3): 4 mg via ORAL
  Filled 2016-12-24 (×3): qty 1

## 2016-12-24 MED ORDER — CEFEPIME HCL 2 G IJ SOLR: 2.0000 g | Freq: Once | INTRAMUSCULAR | Status: DC

## 2016-12-24 MED ORDER — VANCOMYCIN HCL IN DEXTROSE 1-5 GM/200ML-% IV SOLN
1000.0000 mg | Freq: Two times a day (BID) | INTRAVENOUS | Status: DC
  Administered 2016-12-24 – 2016-12-25 (×3): 1000 mg via INTRAVENOUS
  Filled 2016-12-24 (×5): qty 200

## 2016-12-24 MED ORDER — LISINOPRIL-HYDROCHLOROTHIAZIDE 10-12.5 MG PO TABS: 1.0000 | ORAL_TABLET | Freq: Every day | ORAL | Status: DC

## 2016-12-24 MED ORDER — LISINOPRIL 10 MG PO TABS
10.0000 mg | ORAL_TABLET | Freq: Every day | ORAL | Status: DC
  Administered 2016-12-25: 11:00:00 10 mg via ORAL
  Filled 2016-12-24 (×2): qty 1

## 2016-12-24 MED ORDER — VANCOMYCIN HCL IN DEXTROSE 1-5 GM/200ML-% IV SOLN
1000.0000 mg | Freq: Once | INTRAVENOUS | Status: AC
  Administered 2016-12-24: 1000 mg via INTRAVENOUS
  Filled 2016-12-24: qty 200

## 2016-12-24 MED ORDER — OXYCODONE HCL 5 MG PO TABS: 5.0000 mg | ORAL_TABLET | Freq: Three times a day (TID) | ORAL | Status: DC | PRN

## 2016-12-24 MED ORDER — VITAMIN B-12 1000 MCG PO TABS
1000.0000 ug | ORAL_TABLET | Freq: Every day | ORAL | Status: DC
  Administered 2016-12-24 – 2016-12-26 (×3): 1000 ug via ORAL
  Filled 2016-12-24 (×3): qty 1

## 2016-12-24 MED ORDER — PANTOPRAZOLE SODIUM 40 MG PO TBEC
40.0000 mg | DELAYED_RELEASE_TABLET | Freq: Every day | ORAL | Status: DC
  Administered 2016-12-24 – 2016-12-26 (×3): 40 mg via ORAL
  Filled 2016-12-24 (×3): qty 1

## 2016-12-24 MED ORDER — ENOXAPARIN SODIUM 40 MG/0.4ML ~~LOC~~ SOLN
40.0000 mg | SUBCUTANEOUS | Status: DC
  Administered 2016-12-24 – 2016-12-25 (×2): 40 mg via SUBCUTANEOUS
  Filled 2016-12-24 (×2): qty 0.4

## 2016-12-24 NOTE — ED Provider Notes (Signed)
[facility] Emergency Department Provider Note ____________________________________________   First MD Initiated Contact with Patient 12/24/16 1517     (approximate)  I have reviewed the triage vital signs and the nursing notes.   HISTORY  Chief Complaint Breast Pain  HPI Patricia Kelley is a 61 y.o. female currently on chemotherapy.  Patient reports about a day and a half ago she began experiencing redness and swelling in her right breast. This is progressed rapidly and she now reports her right breast seems about twice as large and swollen compared to the left. She has a port in her right upper chest, but the redness has not yet extended over this area.  No nausea or vomiting. No fevers or chills. Notes redness and tenderness and swelling over the right breast area.  Currently on chemotherapy. Last received treatment on Tuesday. Has metastatic lung cancer.  Past Medical History:  Diagnosis Date  . Anemia 01/21/2015  . 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 Anxiety   . GERD (gastroesophageal reflux disease)   . Hypercholesteremia   . Hypertension   . Last menstrual period (LMP) > 10 days ago 2007  . Lung cancer ([place])   . Metastatic lung cancer (metastasis from lung to other site) [facility]) 01/21/2015    Patient Active Problem List   Diagnosis Date Noted  . Goals of care, counseling/discussion 12/03/2016  . Chest pain 05/16/2016  . Metastatic lung cancer (metastasis from lung to other site) ([place]) 01/21/2015  . 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 Empyema of lung ([place]) 01/13/2015  . MRSA (methicillin resistant Staphylococcus aureus) infection 01/13/2015  . Malignant neoplasm of overlapping sites of right lung ([place]) 01/13/2015    Past Surgical History:  Procedure Laterality Date  . ABDOMINAL HYSTERECTOMY     partial  . INSERTION / PLACEMENT PLEURAL CATHETER    . PORTACATH PLACEMENT Right 04/26/2015   Procedure: INSERTION PORT-A-CATH;   Surgeon: Nestor Lewandowsky, MD;  Location: [facility] ORS;  Service: General;  Laterality: Right;    Prior to Admission medications   Medication Sig Start Date End Date Taking? Authorizing Provider  ALPRAZolam (XANAX) 1 MG tablet TAKE 1/2 BY MOUTH 1 TO 2 TIMES DAILY AS NEEDED FOR ANXIETY AND TAKE 1 BY MOUTH AT NIGHT FOR ANXIETY/SLEEP 12/04/16   Lloyd Huger, MD  dexamethasone (DECADRON) 4 MG tablet Take 1 tablet (4 mg total) by mouth daily. 11/24/16   Lloyd Huger, MD  docusate sodium (COLACE) 100 MG capsule Take 100 mg by mouth 2 (two) times daily as needed for mild constipation.    Historical Provider, MD  furosemide (LASIX) 20 MG tablet Take 1 tablet (20 mg total) by mouth daily. 06/21/16   Lloyd Huger, MD  gabapentin (NEURONTIN) 300 MG capsule Take 1 capsule (300 mg total) by mouth 3 (three) times daily. 05/16/16   Lloyd Huger, MD  lidocaine-prilocaine (EMLA) cream Apply cream 1 hour before chemotherapy treatment 10/19/15   Lloyd Huger, MD  lisinopril-hydrochlorothiazide (PRINZIDE,ZESTORETIC) 10-12.5 MG per tablet Take 1 tablet by mouth daily.    Historical Provider, MD  loratadine (CLARITIN) 10 MG tablet Take 10 mg by mouth daily.    Historical Provider, MD  methylPREDNISolone (MEDROL DOSEPAK) 4 MG TBPK tablet Take six tablets on day one, five tablets on day two, four on day three, three on day four, two on day five, take one on day six. Then stop. 11/28/16   Lloyd Huger, MD  Multiple Vitamins-Minerals (MULTIVITAMIN WITH  MINERALS) tablet Take 1 tablet by mouth daily. 04/27/15   Leia Alf, MD  omeprazole (PRILOSEC) 20 MG capsule Take 1 capsule (20 mg total) by mouth daily. 05/17/16   Lytle Butte, MD  oxyCODONE (OXY IR/ROXICODONE) 5 MG immediate release tablet Take 1 tablet (5 mg total) by mouth 3 (three) times daily as needed for severe pain. 09/19/16   Lloyd Huger, MD  potassium chloride SA (K-DUR,KLOR-CON) 20 MEQ tablet Take 1 tablet (20 mEq total) by mouth  daily. 05/09/16   Lloyd Huger, MD  promethazine (PHENERGAN) 25 MG tablet Take 1 tablet (25 mg total) by mouth every 6 (six) hours as needed for nausea or vomiting. 08/09/16   Cammie Sickle, MD  simvastatin (ZOCOR) 20 MG tablet Take 20 mg by mouth at bedtime.     Historical Provider, MD  traMADol (ULTRAM) 50 MG tablet Take 1 tablet (50 mg total) by mouth every 8 (eight) hours as needed for moderate pain. 11/27/16   Lloyd Huger, MD  vitamin B-12 (CYANOCOBALAMIN) 1000 MCG tablet Take 1,000 mcg by mouth daily.    Historical Provider, MD    Allergies Tegaderm ag mesh [silver] and Aspirin  Family History  Problem Relation Age of Onset  . Cervical cancer Mother   . Uterine cancer Maternal Grandmother     Social History Social History  Substance Use Topics  . Smoking status: Former Smoker    Packs/day: 0.25    Years: 8.00    Types: Cigarettes    Quit date: 07/20/2014  . Smokeless tobacco: Never Used  . Alcohol use No    Review of Systems Constitutional: No fever/chills Eyes: No visual changes. ENT: No sore throat. Cardiovascular: Pain over the right breast only, no other "chest pain" Respiratory: Denies shortness of breath. Gastrointestinal: No abdominal pain.  No nausea, no vomiting.  No diarrhea.  No constipation. Genitourinary: Negative for dysuria. Musculoskeletal: Negative for back pain. Some slight swelling her lower legs without any redness or last few days Skin: See history of present illness Neurological: Negative for headaches, focal weakness or numbness.  10-point ROS otherwise negative.  ____________________________________________   PHYSICAL EXAM:  VITAL SIGNS: ED Triage Vitals  Enc Vitals Group     BP 12/24/16 1516 120/75     Pulse Rate 12/24/16 1516 94     Resp 12/24/16 1516 18     Temp --      Temp src --      SpO2 12/24/16 1516 100 %     Weight 12/24/16 1517 170 lb (77.1 kg)     Height 12/24/16 1517 '5\' 5"'$  (1.651 m)     Head  Circumference --      Peak Flow --      Pain Score 12/24/16 1516 9     Pain Loc --      Pain Edu? --      Excl. in [place]? --     Constitutional: Alert and oriented. Well appearing and in no acute distress. Eyes: Conjunctivae are normal. PERRL. EOMI. Head: Atraumatic. Nose: No congestion/rhinnorhea. Mouth/Throat: Mucous membranes are moist.  Oropharynx non-erythematous. Neck: No stridor.   Cardiovascular: Normal rate, regular rhythm. Grossly normal heart sounds.  Good peripheral circulation. Patient's left breast appears normal. The right breast is approximately a third to 50% larger than the right, an area of redness somewhat shiny skin tenderness and heat is noted overlying and surrounding the right Ariel and right breast probably the size of approximately an open hand.  It is a fairly large area. The port in the right upper chest is not covered with erythema at this time, the port site itself is nontender without swelling. Respiratory: Normal respiratory effort.  No retractions. Lungs CTAB. Gastrointestinal: Soft and nontender. No distention.  Musculoskeletal: No lower extremity tenderness with trace 1+ lower extremity edema bilateral without erythema  No joint effusions. Neurologic:  Normal speech and language. No gross focal neurologic deficits are appreciated. Skin:  Skin is warm, dry and intact. No rash noted. Psychiatric: Mood and affect are normal. Speech and behavior are normal.  ____________________________________________   LABS (all labs ordered are listed, but only abnormal results are displayed)  Labs Reviewed  CBC WITH DIFFERENTIAL/PLATELET - Abnormal; Notable for the following:       Result Value   WBC 2.8 (*)    Hemoglobin 10.6 (*)    HCT 33.6 (*)    MCHC 31.7 (*)    RDW 17.5 (*)    Platelets 72 (*)    Lymphs Abs 0.9 (*)    Monocytes Absolute 0.0 (*)    All other components within normal limits  BASIC METABOLIC PANEL - Abnormal; Notable for the following:     Potassium 3.4 (*)    CO2 34 (*)    Glucose, Bld 100 (*)    BUN 24 (*)    Calcium 8.4 (*)    Anion gap 3 (*)    All other components within normal limits  CULTURE, BLOOD (ROUTINE X 2)  CULTURE, BLOOD (ROUTINE X 2)   ____________________________________________  EKG   ____________________________________________  RADIOLOGY  Dg Chest 2 View  Result Date: 12/24/2016 CLINICAL DATA:  Stage four right-sided non-small cell lung cancer. EXAM: CHEST  2 VIEW COMPARISON:  11/28/2016 and chest CT 11/30/2016 FINDINGS: Right IJ Port-A-Cath unchanged. Patient slightly rotated to the right. Small to moderate right-sided pleural effusion tracking to the apex slightly worse. Persistent interstitial prominence over the mid to lower left lung and persistent hazy heterogeneous opacification of the right mid to lower lung. Cardiomediastinal silhouette and remainder of the exam is unchanged. IMPRESSION: Slight worsening moderate size right pleural effusion tracking to the apex. Stable heterogeneous opacification over the right mid to lower lung and interstitial prominence over the left mid to lower lung. Electronically Signed   By: Marin Olp M.D.   On: 12/24/2016 15:54    ____________________________________________   PROCEDURES  Procedure(s) performed: None  Procedures  Critical Care performed: No  ____________________________________________   INITIAL IMPRESSION / ASSESSMENT AND PLAN / ED COURSE  Pertinent labs & imaging results that were available during my care of the patient were reviewed by me and considered in my medical decision making (see chart for details).  Patient with obvious redness probable cellulitis overlying the right breast. Fairly rapid in its progression, I ordered chest x-ray as well as ultrasound to evaluate for possible abscess. Chest x-ray demonstrates worsening right pleural effusion as well. Discussed case with Dr. Tollie Pizza of Gen. surgery, also Dr. Janese Banks of oncology  both recommending IV antibiotics, Dr. Janese Banks recommending broad-spectrum including vancomycin and bacteriocidal.  Patient family agreeable with plan for admission.  Clinical Course as of Dec 24 1644  Nancy Fetter Dec 24, 2016  1530 Called, discussed with Dr. Janese Banks who knows patient from prior call today. Reviewed clinical history and exam including marked swollen, red, erythematous right breast. Last chemo 4/3. Normal WBC on 4/3. Did not have any white count booster. No contraindications for any abx according to heme-onc.  [MQ]  1538 Discussed with Dr. Bary Castilla as well, recommend blood cx, IV abx, and admission for close observation.   [MQ]    Clinical Course User Index [MQ] Delman Kitten, MD     ____________________________________________   FINAL CLINICAL IMPRESSION(S) / ED DIAGNOSES  Final diagnoses:  Right-sided chest pain  Cellulitis of right breast      NEW MEDICATIONS STARTED DURING THIS VISIT:  New Prescriptions   No medications on file     Note:  This document was prepared using Dragon voice recognition software and may include unintentional dictation errors.     Delman Kitten, MD 12/24/16 [phone]

## 2016-12-24 NOTE — Progress Notes (Signed)
ANTIBIOTIC CONSULT NOTE - INITIAL  Pharmacy Consult for Vancomycin  Indication: cellulitis  Allergies  Allergen Reactions  . Tegaderm Ag Mesh [Silver] Other (See Comments)    blisters  . Aspirin Other (See Comments)    GIB    Patient Measurements: Height: '5\' 5"'$  (165.1 cm) Weight: 170 lb (77.1 kg) IBW/kg (Calculated) : 57 Adjusted Body Weight: 65 kg   Vital Signs: Temp: 98.1 F (36.7 C) (04/08 2016) Temp Source: Oral (04/08 2016) BP: 109/79 (04/08 2016) Pulse Rate: 97 (04/08 2016) Intake/Output from previous day: No intake/output data recorded. Intake/Output from this shift: No intake/output data recorded.  Labs:  Recent Labs  12/24/16 1550  WBC 2.8*  HGB 10.6*  PLT 72*  CREATININE 0.58   Estimated Creatinine Clearance: 77.7 mL/min (by C-G formula based on SCr of 0.58 mg/dL). No results for input(s): VANCOTROUGH, VANCOPEAK, VANCORANDOM, GENTTROUGH, GENTPEAK, GENTRANDOM, TOBRATROUGH, TOBRAPEAK, TOBRARND, AMIKACINPEAK, AMIKACINTROU, AMIKACIN in the last 72 hours.   Microbiology: No results found for this or any previous visit (from the past 720 hour(s)).  Medical History: Past Medical History:  Diagnosis Date  . Anemia 01/21/2015  . 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 Anxiety   . GERD (gastroesophageal reflux disease)   . Hypercholesteremia   . Hypertension   . Last menstrual period (LMP) > 10 days ago 2007  . Lung cancer ([place])   . Metastatic lung cancer (metastasis from lung to other site) [facility]) 01/21/2015    Medications:  Prescriptions Prior to Admission  Medication Sig Dispense Refill Last Dose  . ALPRAZolam (XANAX) 1 MG tablet TAKE 1/2 BY MOUTH 1 TO 2 TIMES DAILY AS NEEDED FOR ANXIETY AND TAKE 1 BY MOUTH AT NIGHT FOR ANXIETY/SLEEP 45 tablet 0 prn at prn  . dexamethasone (DECADRON) 4 MG tablet Take 1 tablet (4 mg total) by mouth daily. 30 tablet 0 12/23/2016 at am  . docusate sodium (COLACE) 100 MG capsule Take 100 mg by mouth 2 (two) times daily as  needed for mild constipation.   prn at prn  . furosemide (LASIX) 20 MG tablet Take 1 tablet (20 mg total) by mouth daily. 30 tablet 2 12/23/2016 at am  . gabapentin (NEURONTIN) 300 MG capsule Take 1 capsule (300 mg total) by mouth 3 (three) times daily. 270 capsule 3 12/23/2016 at pm  . lidocaine-prilocaine (EMLA) cream Apply cream 1 hour before chemotherapy treatment 30 g 1 prn at prn  . lisinopril-hydrochlorothiazide (PRINZIDE,ZESTORETIC) 10-12.5 MG per tablet Take 1 tablet by mouth daily.   12/23/2016 at am  . loratadine (CLARITIN) 10 MG tablet Take 10 mg by mouth daily.   prn at prn   . Multiple Vitamins-Minerals (MULTIVITAMIN WITH MINERALS) tablet Take 1 tablet by mouth daily. 30 tablet 3 12/23/2016 at am  . omeprazole (PRILOSEC) 20 MG capsule Take 1 capsule (20 mg total) by mouth daily. 30 capsule 0 prn at prn  . oxyCODONE (OXY IR/ROXICODONE) 5 MG immediate release tablet Take 1 tablet (5 mg total) by mouth 3 (three) times daily as needed for severe pain. 90 tablet 0 prn at prn  . potassium chloride SA (K-DUR,KLOR-CON) 20 MEQ tablet Take 1 tablet (20 mEq total) by mouth daily. 90 tablet 1 12/23/2016 at am  . simvastatin (ZOCOR) 20 MG tablet Take 20 mg by mouth at bedtime.    12/23/2016 at pm  . traMADol (ULTRAM) 50 MG tablet Take 1 tablet (50 mg total) by mouth every 8 (eight) hours as needed for moderate pain. 60 tablet 0 prn  at prn  . vitamin B-12 (CYANOCOBALAMIN) 1000 MCG tablet Take 1,000 mcg by mouth daily.   Taking  . promethazine (PHENERGAN) 25 MG tablet Take 1 tablet (25 mg total) by mouth every 6 (six) hours as needed for nausea or vomiting. (Patient not taking: Reported on 12/24/2016) 30 tablet 0 Not Taking at Unknown time   Assessment: CrCl = 77 ml/min Ke = 0.093 hr-1 T1/2 = 7.45 hrs Vd = 45.5 L   Goal of Therapy:  Vancomycin trough level 10-15 mcg/ml  Plan:  Expected duration 7 days with resolution of temperature and/or normalization of WBC   Vancomycin 1 gm IV X 1 given on 4/8 @  16:24.  Vancomycin 1 gm IV Q12H ordered to start on 4/8 @ 22:00, ~ 6 hrs after 1st dose (stacked dosing).  This pt will reach Css by 4/10 @ 0400. Will draw 1st trough on 4/10 @ 0930.   Jerre Diguglielmo D 12/24/2016,8:40 PM

## 2016-12-24 NOTE — ED Triage Notes (Signed)
Per husband redness and swelling for "a few days"

## 2016-12-24 NOTE — ED Notes (Signed)
Pt ambulated to bathroom then to bench - eating lunch tray. On portable oxygen

## 2016-12-24 NOTE — H&P (Signed)
[facility] [place] NAME: Patricia Kelley    MR#:  [id number]  DATE OF BIRTH:  Jan 01, 1956  DATE OF ADMISSION:  12/24/2016  PRIMARY CARE PHYSICIAN: Marguerita Merles, MD   REQUESTING/REFERRING PHYSICIAN: Dr. Delman Kitten  CHIEF COMPLAINT:   Chief Complaint  Patient presents with  . Breast Pain    HISTORY OF PRESENT ILLNESS:  Patricia Kelley  is a 61 y.o. female with a known history of Lung cancer ongoing chemotherapy, hypertension, hyperlipidemia, GERD, anxiety, chronic anemia presents to the hospital due to right breast tenderness and redness. Patient had her last chemotherapy treatment this past Tuesday and 2 days after on Thursday she noticed some right breast pain and tenderness. She then developed some redness over the past day or 2 and some chills but no documented fever. She presents to the ER as her symptoms are not improving and was noted to have a right breast cellulitis and therefore hospitalist services were called for treatment evaluation. Patient is mildly pancytopenic secondary to her chemotherapy. She admits to some chronic shortness of breath given her lung cancer, but no chest pain no nausea no vomiting abdominal pain or diarrhea and no other associated symptoms presently.  PAST MEDICAL HISTORY:   Past Medical History:  Diagnosis Date  . Anemia 01/21/2015  . 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 Anxiety   . GERD (gastroesophageal reflux disease)   . Hypercholesteremia   . Hypertension   . Last menstrual period (LMP) > 10 days ago 2007  . Lung cancer ([place])   . Metastatic lung cancer (metastasis from lung to other site) [facility]) 01/21/2015    PAST SURGICAL HISTORY:   Past Surgical History:  Procedure Laterality Date  . ABDOMINAL HYSTERECTOMY     partial  . INSERTION / PLACEMENT PLEURAL CATHETER    . PORTACATH PLACEMENT Right 04/26/2015   Procedure: INSERTION PORT-A-CATH;  Surgeon: Nestor Lewandowsky, MD;  Location: [facility] ORS;   Service: General;  Laterality: Right;    SOCIAL HISTORY:   Social History  Substance Use Topics  . Smoking status: Former Smoker    Packs/day: 0.50    Years: 40.00    Types: Cigarettes    Quit date: 07/20/2014  . Smokeless tobacco: Never Used  . Alcohol use Yes     Comment: socially    FAMILY HISTORY:   Family History  Problem Relation Age of Onset  . Cervical cancer Mother   . CVA Mother   . Uterine cancer Maternal Grandmother   . CVA Father     DRUG ALLERGIES:   Allergies  Allergen Reactions  . Tegaderm Ag Mesh [Silver] Other (See Comments)    blisters  . Aspirin Other (See Comments)    GIB    REVIEW OF SYSTEMS:   Review of Systems  Constitutional: Positive for chills. Negative for fever and weight loss.  HENT: Negative for congestion, nosebleeds and tinnitus.   Eyes: Negative for blurred vision, double vision and redness.  Respiratory: Positive for shortness of breath (chronic). Negative for cough and hemoptysis.   Cardiovascular: Negative for chest pain, orthopnea, leg swelling and PND.  Gastrointestinal: Negative for abdominal pain, diarrhea, melena, nausea and vomiting.  Genitourinary: Negative for dysuria, hematuria and urgency.  Musculoskeletal: Negative for falls and joint pain.  Skin: Positive for rash (Right Breat Cellulitis).  Neurological: Negative for dizziness, tingling, sensory change, focal weakness, seizures, weakness and headaches.  Endo/Heme/Allergies: Negative for polydipsia. Does not bruise/bleed easily.  Psychiatric/Behavioral: Negative for  depression and memory loss. The patient is not nervous/anxious.     MEDICATIONS AT HOME:   Prior to Admission medications   Medication Sig Start Date End Date Taking? Authorizing Provider  ALPRAZolam (XANAX) 1 MG tablet TAKE 1/2 BY MOUTH 1 TO 2 TIMES DAILY AS NEEDED FOR ANXIETY AND TAKE 1 BY MOUTH AT NIGHT FOR ANXIETY/SLEEP 12/04/16  Yes Lloyd Huger, MD  dexamethasone (DECADRON) 4 MG tablet  Take 1 tablet (4 mg total) by mouth daily. 11/24/16  Yes Lloyd Huger, MD  docusate sodium (COLACE) 100 MG capsule Take 100 mg by mouth 2 (two) times daily as needed for mild constipation.   Yes Historical Provider, MD  furosemide (LASIX) 20 MG tablet Take 1 tablet (20 mg total) by mouth daily. 06/21/16  Yes Lloyd Huger, MD  gabapentin (NEURONTIN) 300 MG capsule Take 1 capsule (300 mg total) by mouth 3 (three) times daily. 05/16/16  Yes Lloyd Huger, MD  lidocaine-prilocaine (EMLA) cream Apply cream 1 hour before chemotherapy treatment 10/19/15  Yes Lloyd Huger, MD  lisinopril-hydrochlorothiazide (PRINZIDE,ZESTORETIC) 10-12.5 MG per tablet Take 1 tablet by mouth daily.   Yes Historical Provider, MD  loratadine (CLARITIN) 10 MG tablet Take 10 mg by mouth daily.   Yes Historical Provider, MD  Multiple Vitamins-Minerals (MULTIVITAMIN WITH MINERALS) tablet Take 1 tablet by mouth daily. 04/27/15  Yes Leia Alf, MD  omeprazole (PRILOSEC) 20 MG capsule Take 1 capsule (20 mg total) by mouth daily. 05/17/16  Yes Lytle Butte, MD  oxyCODONE (OXY IR/ROXICODONE) 5 MG immediate release tablet Take 1 tablet (5 mg total) by mouth 3 (three) times daily as needed for severe pain. 09/19/16  Yes Lloyd Huger, MD  potassium chloride SA (K-DUR,KLOR-CON) 20 MEQ tablet Take 1 tablet (20 mEq total) by mouth daily. 05/09/16  Yes Lloyd Huger, MD  simvastatin (ZOCOR) 20 MG tablet Take 20 mg by mouth at bedtime.    Yes Historical Provider, MD  traMADol (ULTRAM) 50 MG tablet Take 1 tablet (50 mg total) by mouth every 8 (eight) hours as needed for moderate pain. 11/27/16  Yes Lloyd Huger, MD  vitamin B-12 (CYANOCOBALAMIN) 1000 MCG tablet Take 1,000 mcg by mouth daily.   Yes Historical Provider, MD  promethazine (PHENERGAN) 25 MG tablet Take 1 tablet (25 mg total) by mouth every 6 (six) hours as needed for nausea or vomiting. Patient not taking: Reported on 12/24/2016 08/09/16   Cammie Sickle, MD      VITAL SIGNS:  Blood pressure 107/89, pulse 94, resp. rate 18, height '5\' 5"'$  (1.651 m), weight 77.1 kg (170 lb), SpO2 100 %.  PHYSICAL EXAMINATION:  Physical Exam  GENERAL:  61 y.o.-year-old patient lying in bed in no acute distress.  EYES: Pupils equal, round, reactive to light and accommodation. No scleral icterus. Extraocular muscles intact.  HEENT: Head atraumatic, normocephalic. Oropharynx and nasopharynx clear. No oropharyngeal erythema, moist oral mucosa  NECK:  Supple, no jugular venous distention. No thyroid enlargement, no tenderness.  LUNGS: Normal breath sounds bilaterally, no wheezing, rales, rhonchi. No use of accessory muscles of respiration.  CARDIOVASCULAR: S1, S2 RRR. No murmurs, rubs, gallops, clicks.  ABDOMEN: Soft, nontender, nondistended. Bowel sounds present. No organomegaly or mass.  EXTREMITIES: No pedal edema, cyanosis, or clubbing. + 2 pedal & radial pulses b/l.   NEUROLOGIC: Cranial nerves II through XII are intact. No focal Motor or sensory deficits appreciated b/l PSYCHIATRIC: The patient is alert and oriented x 3. SKIN: Right  breast redness, tenderness consistent with mild Cellulitis, No lesion, or ulcer or drainage.   LABORATORY PANEL:   CBC  Recent Labs Lab 12/24/16 1550  WBC 2.8*  HGB 10.6*  HCT 33.6*  PLT 72*   ------------------------------------------------------------------------------------------------------------------  Chemistries   Recent Labs Lab 12/19/16 1012 12/24/16 1550  NA 136 139  K 3.6 3.4*  CL 102 102  CO2 27 34*  GLUCOSE 137* 100*  BUN 20 24*  CREATININE 0.71 0.58  CALCIUM 8.5* 8.4*  AST 15  --   ALT 17  --   ALKPHOS 37*  --   BILITOT 0.5  --    ------------------------------------------------------------------------------------------------------------------  Cardiac Enzymes No results for input(s): TROPONINI in the last 168  hours. ------------------------------------------------------------------------------------------------------------------  RADIOLOGY:  Dg Chest 2 View  Result Date: 12/24/2016 CLINICAL DATA:  Stage four right-sided non-small cell lung cancer. EXAM: CHEST  2 VIEW COMPARISON:  11/28/2016 and chest CT 11/30/2016 FINDINGS: Right IJ Port-A-Cath unchanged. Patient slightly rotated to the right. Small to moderate right-sided pleural effusion tracking to the apex slightly worse. Persistent interstitial prominence over the mid to lower left lung and persistent hazy heterogeneous opacification of the right mid to lower lung. Cardiomediastinal silhouette and remainder of the exam is unchanged. IMPRESSION: Slight worsening moderate size right pleural effusion tracking to the apex. Stable heterogeneous opacification over the right mid to lower lung and interstitial prominence over the left mid to lower lung. Electronically Signed   By: Marin Olp M.D.   On: 12/24/2016 15:54     IMPRESSION AND PLAN:   61 year old female with past medical history of lung cancer currently getting treatment, hypertension, hyperlipidemia, GERD, anxiety, anemia of chronic disease who presents to the hospital due to right breast pain and redness and swelling.  1. Cellulitis of the right Breast -this is a suspected cause of the patient's right breast tenderness swelling and redness. -I will empirically start treatment with IV vancomycin and follow clinically. -Unclear if this is a localized reaction to the patient's new chemotherapy treatment that she received this past Tuesday and then developed symptoms on Thursday. Or is related to her infectious process like cellulitis. I will get oncology consult for further evaluation.  2. Pancytopenia-secondary to recent chemotherapy and underlying lung cancer -No acute need for transfusion. Follow counts.  3. Metastatic lung cancer-we'll consult oncology. Patient follows with Dr.  Grayland Ormond.  4. Anxiety-continue Xanax.  5. Neuropathy-continue gabapentin.  6. Essential hypertension-continue lisinopril/HCTZ.  7. Hyperlipidemia-continue simvastatin.    All the records are reviewed and case discussed with ED provider. Management plans discussed with the patient, family and they are in agreement.  CODE STATUS: Full  TOTAL TIME TAKING CARE OF THIS PATIENT: 45 minutes.    Henreitta Leber M.D on 12/24/2016 at 5:59 PM  Between 7am to 6pm - Pager - [phone]  After 6pm go to [email or web address] - password [email or web address] [facility]  Office  [phone]  CC: Primary care physician; Marguerita Merles, MD

## 2016-12-24 NOTE — ED Triage Notes (Signed)
Rt breast red, hot, swollen to approx twice its normal size.

## 2016-12-25 DIAGNOSIS — Z87891 Personal history of nicotine dependence: Secondary | ICD-10-CM

## 2016-12-25 DIAGNOSIS — T451X5S Adverse effect of antineoplastic and immunosuppressive drugs, sequela: Secondary | ICD-10-CM

## 2016-12-25 DIAGNOSIS — Z79899 Other long term (current) drug therapy: Secondary | ICD-10-CM

## 2016-12-25 DIAGNOSIS — C7951 Secondary malignant neoplasm of bone: Secondary | ICD-10-CM

## 2016-12-25 DIAGNOSIS — R05 Cough: Secondary | ICD-10-CM

## 2016-12-25 DIAGNOSIS — R109 Unspecified abdominal pain: Secondary | ICD-10-CM

## 2016-12-25 DIAGNOSIS — D701 Agranulocytosis secondary to cancer chemotherapy: Secondary | ICD-10-CM

## 2016-12-25 DIAGNOSIS — C3491 Malignant neoplasm of unspecified part of right bronchus or lung: Secondary | ICD-10-CM

## 2016-12-25 DIAGNOSIS — I10 Essential (primary) hypertension: Secondary | ICD-10-CM

## 2016-12-25 DIAGNOSIS — N61 Mastitis without abscess: Principal | ICD-10-CM

## 2016-12-25 DIAGNOSIS — D6959 Other secondary thrombocytopenia: Secondary | ICD-10-CM

## 2016-12-25 DIAGNOSIS — R0602 Shortness of breath: Secondary | ICD-10-CM

## 2016-12-25 DIAGNOSIS — Z9221 Personal history of antineoplastic chemotherapy: Secondary | ICD-10-CM

## 2016-12-25 DIAGNOSIS — E7800 Pure hypercholesterolemia, unspecified: Secondary | ICD-10-CM

## 2016-12-25 DIAGNOSIS — J910 Malignant pleural effusion: Secondary | ICD-10-CM

## 2016-12-25 DIAGNOSIS — K219 Gastro-esophageal reflux disease without esophagitis: Secondary | ICD-10-CM

## 2016-12-25 DIAGNOSIS — C7802 Secondary malignant neoplasm of left lung: Secondary | ICD-10-CM

## 2016-12-25 LAB — CBC
HCT: 32.5 % — ABNORMAL LOW (ref 35.0–47.0)
Hemoglobin: 10.4 g/dL — ABNORMAL LOW (ref 12.0–16.0)
MCH: 26.6 pg (ref 26.0–34.0)
MCHC: 32 g/dL (ref 32.0–36.0)
MCV: 83.2 fL (ref 80.0–100.0)
PLATELETS: 70 10*3/uL — AB (ref 150–440)
RBC: 3.91 MIL/uL (ref 3.80–5.20)
RDW: 17.6 % — AB (ref 11.5–14.5)
WBC: 1.7 10*3/uL — ABNORMAL LOW (ref 3.6–11.0)

## 2016-12-25 LAB — BASIC METABOLIC PANEL
Anion gap: 4 — ABNORMAL LOW (ref 5–15)
BUN: 17 mg/dL (ref 6–20)
CALCIUM: 8.4 mg/dL — AB (ref 8.9–10.3)
CHLORIDE: 101 mmol/L (ref 101–111)
CO2: 32 mmol/L (ref 22–32)
CREATININE: 0.38 mg/dL — AB (ref 0.44–1.00)
GFR calc Af Amer: >60 mL/min (ref >60)
GFR calc non Af Amer: >60 mL/min (ref >60)
Glucose, Bld: 130 mg/dL — ABNORMAL HIGH (ref 65–99)
Potassium: 4.2 mmol/L (ref 3.5–5.1)
SODIUM: 137 mmol/L (ref 135–145)

## 2016-12-25 NOTE — Progress Notes (Signed)
PT Cancellation Note  Patient Details Name: Patricia Kelley MRN: [id number] DOB: 24-Oct-1955   Cancelled Treatment:    Reason Eval/Treat Not Completed: Patient declined, no reason specified (chart reviewed, eval attempted but patient adamantly refusing PT at this time despite encouragement and education, states she does not wish to get out of bed this afternoon, will check again tomorrow)   Orlene Och Student PT 12/25/2016, 3:01 PM

## 2016-12-25 NOTE — Progress Notes (Addendum)
[facility] [place] NAME: Patricia Kelley    MR#:  [id number]  DATE OF BIRTH:  1956-07-01  SUBJECTIVE:  CHIEF COMPLAINT:   Chief Complaint  Patient presents with  . Breast Pain   Better redness and tenderness on the right breast. REVIEW OF SYSTEMS:  Review of Systems  Constitutional: Positive for malaise/fatigue. Negative for chills and fever.  HENT: Negative for congestion.   Eyes: Negative for blurred vision and double vision.  Respiratory: Negative for cough, shortness of breath and stridor.   Cardiovascular: Negative for chest pain and leg swelling.  Gastrointestinal: Negative for abdominal pain, blood in stool, constipation, diarrhea, melena, nausea and vomiting.  Genitourinary: Negative for dysuria and hematuria.  Musculoskeletal: Negative for back pain.  Skin:       Better redness and tenderness on the right breast.  Neurological: Positive for weakness. Negative for dizziness, focal weakness and loss of consciousness.  Psychiatric/Behavioral: Negative for depression. The patient is not nervous/anxious.     DRUG ALLERGIES:   Allergies  Allergen Reactions  . Tegaderm Ag Mesh [Silver] Other (See Comments)    blisters  . Aspirin Other (See Comments)    GIB   VITALS:  Blood pressure 114/80, pulse 89, temperature 97.8 F (36.6 C), temperature source Oral, resp. rate 18, height '5\' 5"'$  (1.651 m), weight 170 lb 11.2 oz (77.4 kg), SpO2 100 %. PHYSICAL EXAMINATION:  Physical Exam  Constitutional: She is oriented to person, place, and time and well-developed, well-nourished, and in no distress.  HENT:  Head: Normocephalic.  Mouth/Throat: Oropharynx is clear and moist.  Eyes: Conjunctivae and EOM are normal.  Neck: Normal range of motion. Neck supple. No JVD present. No tracheal deviation present.  Cardiovascular: Normal rate, regular rhythm and normal heart sounds.  Exam reveals no gallop.   No murmur heard. Pulmonary/Chest:  Effort normal and breath sounds normal. No respiratory distress. She has no wheezes. She has no rales.  Abdominal: Soft. Bowel sounds are normal. She exhibits no distension. There is no tenderness.  Musculoskeletal: Normal range of motion. She exhibits no edema or tenderness.  Neurological: She is alert and oriented to person, place, and time. No cranial nerve deficit.  Skin: No rash noted. There is erythema.   Better erythema and tenderness on the right breast.   Psychiatric: Affect normal.   LABORATORY PANEL:  Female CBC  Recent Labs Lab 12/25/16 0515  WBC 1.7*  HGB 10.4*  HCT 32.5*  PLT 70*   ------------------------------------------------------------------------------------------------------------------ Chemistries   Recent Labs Lab 12/19/16 1012  12/25/16 0515  NA 136  < > 137  K 3.6  < > 4.2  CL 102  < > 101  CO2 27  < > 32  GLUCOSE 137*  < > 130*  BUN 20  < > 17  CREATININE 0.71  < > 0.38*  CALCIUM 8.5*  < > 8.4*  AST 15  --   --   ALT 17  --   --   ALKPHOS 37*  --   --   BILITOT 0.5  --   --   < > = values in this interval not displayed. RADIOLOGY:  Dg Chest 2 View  Result Date: 12/24/2016 CLINICAL DATA:  Stage four right-sided non-small cell lung cancer. EXAM: CHEST  2 VIEW COMPARISON:  11/28/2016 and chest CT 11/30/2016 FINDINGS: Right IJ Port-A-Cath unchanged. Patient slightly rotated to the right. Small to moderate right-sided pleural effusion tracking to the apex slightly  worse. Persistent interstitial prominence over the mid to lower left lung and persistent hazy heterogeneous opacification of the right mid to lower lung. Cardiomediastinal silhouette and remainder of the exam is unchanged. IMPRESSION: Slight worsening moderate size right pleural effusion tracking to the apex. Stable heterogeneous opacification over the right mid to lower lung and interstitial prominence over the left mid to lower lung. Electronically Signed   By: Marin Olp M.D.   On:  12/24/2016 15:54   ASSESSMENT AND PLAN:   61 year old female with past medical history of lung cancer currently getting treatment, hypertension, hyperlipidemia, GERD, anxiety, anemia of chronic disease who presents to the hospital due to right breast pain and redness and swelling.  1. Cellulitis of the right Breast  On IV Cefepime and vancomycin, Improving clinically, may change to clindamycin on discharge.  2. Pancytopenia-secondary to recent chemotherapy and underlying lung cancer. Worsening leukopenia. Follow-up CBC and oncologist.  3. Metastatic lung cancer- follows with Dr. Grayland Ormond.  4. Anxiety-continue Xanax.  5. Neuropathy-continue gabapentin.  6. Essential hypertension-continue lisinopril/HCTZ.  7. Hyperlipidemia-continue simvastatin.  Generalized weakness. PT evaluation. All the records are reviewed and case discussed with Care Management/Social Worker. Management plans discussed with the patient, family and they are in agreement.  CODE STATUS: Full Code  TOTAL TIME TAKING CARE OF THIS PATIENT: 37 minutes.   More than 50% of the time was spent in counseling/coordination of care: YES  POSSIBLE D/C IN 2 DAYS, DEPENDING ON CLINICAL CONDITION.   Demetrios Loll M.D on 12/25/2016 at 1:31 PM  Between 7am to 6pm - Pager - [phone]  After 6pm go to [email or web address] - Proofreader  [facility]  Office  [phone]  CC: Primary care physician; Marguerita Merles, MD  Note: This dictation was prepared with Dragon dictation along with smaller phrase technology. Any transcriptional errors that result from this process are unintentional.

## 2016-12-25 NOTE — Consult Note (Signed)
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   INTERVAL HISTORY: Patient is a 61 year old female who last received chemotherapy for the above stated lung cancer on December 19, 2016. She presents to the emergency room with a 1-2 day history of erythematous, warm, and swollen right breast just lateral to her port. Currently she feels well and nearly back to her baseline. Since receiving antibiotics, her cellulitis appears to have resolved. She has no neurologic complaints. She continues to have chronic shortness of breath and cough. She denies any chest pain. She denies any fevers. She has a fair appetite, but denies weight loss. She has no nausea, vomiting, constipation, or diarrhea. She has no urinary complaints. Patient otherwise feels well and offers no further specific complaints.  REVIEW OF SYSTEMS:   Review of Systems  Constitutional: Negative.  Negative for fever, malaise/fatigue and weight loss.  Respiratory: Positive for cough and shortness of breath. Negative for hemoptysis.   Cardiovascular: Negative.  Negative for leg swelling.  Gastrointestinal: Positive for abdominal pain.  Genitourinary: Negative.   Musculoskeletal: Negative.   Skin: Negative.   Neurological: Negative.  Negative for sensory change and weakness.  Psychiatric/Behavioral: Negative.  The patient is not nervous/anxious.     As per HPI. Otherwise, a complete review of systems is negative.  PAST MEDICAL HISTORY: Past Medical History:  Diagnosis Date  . Anemia 01/21/2015  . 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 Anxiety   . GERD (gastroesophageal reflux disease)   . Hypercholesteremia   .  Hypertension   . Last menstrual period (LMP) > 10 days ago 2007  . Lung cancer ([place])   . Metastatic lung cancer (metastasis from lung to other site) [facility]) 01/21/2015    PAST SURGICAL HISTORY: Past Surgical History:  Procedure Laterality Date  . ABDOMINAL HYSTERECTOMY     partial  . INSERTION / PLACEMENT PLEURAL CATHETER    . PORTACATH PLACEMENT Right 04/26/2015   Procedure: INSERTION PORT-A-CATH;  Surgeon: Nestor Lewandowsky, MD;  Location: [facility] ORS;  Service: General;  Laterality: Right;    FAMILY HISTORY: Family History  Problem Relation Age of Onset  . Cervical cancer Mother   . CVA Mother   . Uterine cancer Maternal Grandmother   . CVA Father     ADVANCED DIRECTIVES (Y/N):  '@ADVDIR'$ @  HEALTH MAINTENANCE: Social History  Substance Use Topics  . Smoking status: Former Smoker    Packs/day: 0.50    Years: 40.00    Types: Cigarettes    Quit date: 07/20/2014  . Smokeless tobacco: Never Used  . Alcohol use Yes     Comment: socially     Colonoscopy:  PAP:  Bone density:  Lipid panel:  Allergies  Allergen Reactions  . Tegaderm Ag Mesh [Silver] Other (See Comments)    blisters  . Aspirin Other (See Comments)    GIB    Current Facility-Administered Medications  Medication Dose Route Frequency Provider Last Rate Last Dose  . acetaminophen (TYLENOL) tablet 650 mg  650 mg Oral Q6H PRN Henreitta Leber, MD       Or  . acetaminophen (TYLENOL) suppository 650 mg  650 mg Rectal Q6H PRN Henreitta Leber, MD      . ALPRAZolam (  XANAX) tablet 0.5 mg  0.5 mg Oral BID PRN Henreitta Leber, MD      . dexamethasone (DECADRON) tablet 4 mg  4 mg Oral Daily Henreitta Leber, MD   4 mg at 12/25/16 1106  . docusate sodium (COLACE) capsule 100 mg  100 mg Oral BID PRN Henreitta Leber, MD      . enoxaparin (LOVENOX) injection 40 mg  40 mg Subcutaneous Q24H Henreitta Leber, MD   40 mg at 12/25/16 2040  . furosemide (LASIX) tablet 20 mg  20 mg Oral Daily Henreitta Leber, MD   20 mg at 12/25/16 1107  .  gabapentin (NEURONTIN) capsule 300 mg  300 mg Oral TID Henreitta Leber, MD   300 mg at 12/25/16 2040  . lisinopril (PRINIVIL,ZESTRIL) tablet 10 mg  10 mg Oral Daily Henreitta Leber, MD   10 mg at 12/25/16 1106   And  . hydrochlorothiazide (MICROZIDE) capsule 12.5 mg  12.5 mg Oral Daily Henreitta Leber, MD   12.5 mg at 12/25/16 1107  . loratadine (CLARITIN) tablet 10 mg  10 mg Oral Daily PRN Henreitta Leber, MD      . multivitamin with minerals tablet 1 tablet  1 tablet Oral Daily Henreitta Leber, MD   1 tablet at 12/25/16 1105  . ondansetron (ZOFRAN) tablet 4 mg  4 mg Oral Q6H PRN Henreitta Leber, MD       Or  . ondansetron (ZOFRAN) injection 4 mg  4 mg Intravenous Q6H PRN Henreitta Leber, MD      . oxyCODONE (Oxy IR/ROXICODONE) immediate release tablet 5 mg  5 mg Oral TID PRN Henreitta Leber, MD      . pantoprazole (PROTONIX) EC tablet 40 mg  40 mg Oral Daily Henreitta Leber, MD   40 mg at 12/25/16 1106  . potassium chloride SA (K-DUR,KLOR-CON) CR tablet 20 mEq  20 mEq Oral Daily Henreitta Leber, MD   20 mEq at 12/25/16 1107  . simvastatin (ZOCOR) tablet 20 mg  20 mg Oral QHS Henreitta Leber, MD   20 mg at 12/25/16 2040  . traMADol (ULTRAM) tablet 50 mg  50 mg Oral Q8H PRN Henreitta Leber, MD      . vancomycin (VANCOCIN) IVPB 1000 mg/200 mL premix  1,000 mg Intravenous Q12H Henreitta Leber, MD   1,000 mg at 12/25/16 2042  . vitamin B-12 (CYANOCOBALAMIN) tablet 1,000 mcg  1,000 mcg Oral Daily Henreitta Leber, MD   1,000 mcg at 12/25/16 1107   Facility-Administered Medications Ordered in Other Encounters  Medication Dose Route Frequency Provider Last Rate Last Dose  . heparin lock flush 100 unit/mL  250 Units Intracatheter PRN Evlyn Kanner, NP      . sodium chloride 0.9 % injection 10 mL  10 mL Intracatheter PRN Evlyn Kanner, NP      . sodium chloride 0.9 % injection 10 mL  10 mL Intravenous PRN Leia Alf, MD   10 mL at 04/27/15 0905  . sodium chloride 0.9 % injection 3 mL  3 mL  Intracatheter PRN Evlyn Kanner, NP        OBJECTIVE: Vitals:   12/25/16 1656 12/25/16 1947  BP: 98/67 121/78  Pulse: 88 93  Resp: 18 20  Temp: 98.7 F (37.1 C) 97.9 F (36.6 C)     Body mass index is 28.41 kg/m.    ECOG FS:1 - Symptomatic but completely ambulatory  General: Well-developed, well-nourished, no acute distress. Eyes: Pink conjunctiva, anicteric sclera. HEENT: Normocephalic, moist mucous membranes, clear oropharnyx. Breasts: Right breast without erythema or warmth. Pen outline noted on chest wall. Lungs: Clear to auscultation bilaterally. Heart: Regular rate and rhythm. No rubs, murmurs, or gallops. Abdomen: Soft, nontender, nondistended. No organomegaly noted, normoactive bowel sounds. Musculoskeletal: No edema, cyanosis, or clubbing. Neuro: Alert, answering all questions appropriately. Cranial nerves grossly intact. Skin: No rashes or petechiae noted. Psych: Normal affect.   LAB RESULTS:  Lab Results  Component Value Date   NA 137 12/25/2016   K 4.2 12/25/2016   CL 101 12/25/2016   CO2 32 12/25/2016   GLUCOSE 130 (H) 12/25/2016   BUN 17 12/25/2016   CREATININE 0.38 (L) 12/25/2016   CALCIUM 8.4 (L) 12/25/2016   PROT 5.4 (L) 12/19/2016   ALBUMIN 2.7 (L) 12/19/2016   AST 15 12/19/2016   ALT 17 12/19/2016   ALKPHOS 37 (L) 12/19/2016   BILITOT 0.5 12/19/2016   GFRNONAA >60 12/25/2016   GFRAA >60 12/25/2016    Lab Results  Component Value Date   WBC 1.7 (L) 12/25/2016   NEUTROABS 1.9 12/24/2016   HGB 10.4 (L) 12/25/2016   HCT 32.5 (L) 12/25/2016   MCV 83.2 12/25/2016   PLT 70 (L) 12/25/2016     STUDIES: Dg Chest 2 View  Result Date: 12/24/2016 CLINICAL DATA:  Stage four right-sided non-small cell lung cancer. EXAM: CHEST  2 VIEW COMPARISON:  11/28/2016 and chest CT 11/30/2016 FINDINGS: Right IJ Port-A-Cath unchanged. Patient slightly rotated to the right. Small to moderate right-sided pleural effusion tracking to the apex slightly worse.  Persistent interstitial prominence over the mid to lower left lung and persistent hazy heterogeneous opacification of the right mid to lower lung. Cardiomediastinal silhouette and remainder of the exam is unchanged. IMPRESSION: Slight worsening moderate size right pleural effusion tracking to the apex. Stable heterogeneous opacification over the right mid to lower lung and interstitial prominence over the left mid to lower lung. Electronically Signed   By: Marin Olp M.D.   On: 12/24/2016 15:54   Dg Chest 2 View  Result Date: 11/28/2016 CLINICAL DATA:  Stage IV lung cancer . EXAM: CHEST  2 VIEW COMPARISON:  10/26/2016. FINDINGS: Port-A-Cath noted with tip over the superior vena cava. Cardiomegaly. Diffuse bilateral nodular pulmonary interstitial prominence with prominent right pleural effusion and small left pleural effusion noted. These changes could be related to malignancy. Active pneumonitis cannot be excluded. No pneumothorax. No acute bony abnormality . IMPRESSION: 1. Port-A-Cath in stable position. 2. Diffuse bilateral nodular pulmonary interstitial prominence with bilateral pleural effusions, right side greater than left again noted. These changes could be malignant. Active pneumonitis cannot be excluded. No change from prior exam. Electronically Signed   By: Marcello Moores  Register   On: 11/28/2016 11:09   Ct Chest W Contrast  Result Date: 12/01/2016 CLINICAL DATA:  Right-sided lung cancer with metastasis. Worsening shortness of breath. Left thoracentesis 3 weeks ago. Status post radiation therapy and chemotherapy. EXAM: CT CHEST WITH CONTRAST TECHNIQUE: Multidetector CT imaging of the chest was performed during intravenous contrast administration. CONTRAST:  10m ISOVUE-300 IOPAMIDOL (ISOVUE-300) INJECTION 61% COMPARISON:  Plain films 11/28/2016. PET 10/18/2016. Chest CT 10/10/2016. FINDINGS: Cardiovascular: A right Port-A-Cath which terminates at the mid SVC. Advanced aortic and branch vessel  atherosclerosis. Normal heart size, without pericardial effusion. Multivessel coronary artery atherosclerosis. No central pulmonary embolism, on this non-dedicated study. Mediastinum/Nodes: Right-sided thyroid nodule measures 9 mm and is nonspecific. Mediastinal edema, without  well-defined adenopathy. No hilar adenopathy. Lungs/Pleura: Circumferential right-sided pleural fluid and thickening are similar. Areas of hyperattenuation along the parietal pleural surface, including on image 108/series 2, suspicious for pleural metastasis. The most masslike area measures on the order of 12 mm. Moderate left pleural effusion is similar and layers posteriorly. No complicating factors identified. Irregular right-sided septal thickening with areas of peribronchovascular consolidation. Slightly progressive and highly suspicious for lymphangitic tumor spread. Example at the right apex on image 42/series 3. Pulmonary nodules are most apparent on the left. An index left lower lobe nodule measures 10 mm on image 73/series 3 versus 6 mm on the prior exam (when remeasured). Index left upper lobe 6 mm nodule on image 78/series 3 measured 4 mm on the prior exam (when remeasured). Upper Abdomen: Normal imaged portions of the liver, spleen, pancreas, adrenal glands, right kidney. Gastric underdistention. Indeterminate upper pole left renal lesion measures 1.9 cm and is incompletely imaged but grossly similar in size to on the prior exam. Musculoskeletal: No acute osseous abnormality. IMPRESSION: 1. Since 10/10/2016, disease progression. 2. similar left larger than right pleural effusions. The right-sided pleural effusion is complex, with areas of pleural nodularity, highly suspicious for malignant effusion. 3. Progression of right-sided pulmonary process which is suspicious for lymphangitic tumor spread. 4. Progressive pulmonary nodules/metastasis. 5.  Coronary artery atherosclerosis. Aortic atherosclerosis. 6. Complex left renal lesion  is incompletely imaged but grossly similar in size to on the prior exam. Electronically Signed   By: Abigail Miyamoto M.D.   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  PLAN:    1. Right breast cellulitis: Does not appear to be associated with patient's port. Nearly resolved after IV antibiotics. Okay to discharge from an oncology standpoint with oral antibiotics. 2. 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 Hospice and end-of-life or previously discussed with patient, but she is declining at this time and wished to pursue additional treatment. Patient received dose reduced gemcitabine and vinorelbine on December 19, 2016. She has been instructed to keep her previously scheduled follow-up appointment for consideration of her next treatment on January 02, 2017. 3. Leukopenia: Secondary to chemotherapy, monitor proving patient does not require Neupogen or Neulasta. 4. Thrombocytopenia: Secondary chemotherapy, monitor.  Appreciate consult, call with questions. I will be out of town starting Tuesday, December 26, 2016. If there are any questions please call the oncologist on-call.  Lloyd Huger, MD   12/25/2016 10:22 PM

## 2016-12-26 LAB — CBC
HEMATOCRIT: 31.8 % — AB (ref 35.0–47.0)
HEMOGLOBIN: 10.1 g/dL — AB (ref 12.0–16.0)
MCH: 26.4 pg (ref 26.0–34.0)
MCHC: 31.8 g/dL — ABNORMAL LOW (ref 32.0–36.0)
MCV: 83.1 fL (ref 80.0–100.0)
PLATELETS: 55 10*3/uL — AB (ref 150–440)
RBC: 3.83 MIL/uL (ref 3.80–5.20)
RDW: 17.3 % — ABNORMAL HIGH (ref 11.5–14.5)
WBC: 3.6 10*3/uL (ref 3.6–11.0)

## 2016-12-26 MED ORDER — CLINDAMYCIN HCL 300 MG PO CAPS: 300.0000 mg | ORAL_CAPSULE | Freq: Four times a day (QID) | ORAL | 0 refills | Status: DC

## 2016-12-26 MED ORDER — CLINDAMYCIN HCL 150 MG PO CAPS
300.0000 mg | ORAL_CAPSULE | Freq: Four times a day (QID) | ORAL | Status: DC
  Administered 2016-12-26: 10:00:00 300 mg via ORAL
  Filled 2016-12-26: qty 2
  Filled 2016-12-26: qty 1

## 2016-12-26 NOTE — Discharge Instructions (Signed)
Heart healthy diet

## 2016-12-26 NOTE — Discharge Summary (Signed)
[facility] [place] NAME: Patricia Kelley    MR#:  [id number]  DATE OF BIRTH:  12-03-1955  DATE OF ADMISSION:  12/24/2016   ADMITTING PHYSICIAN: Henreitta Leber, MD  DATE OF DISCHARGE: 12/26/2016 PRIMARY CARE PHYSICIAN: Marguerita Merles, MD   ADMISSION DIAGNOSIS:  Pain of right breast [N64.4] Right-sided chest pain [R07.9] Cellulitis of right breast [N61.0] DISCHARGE DIAGNOSIS:  Active Problems:   Cellulitis of right breast Pancytopenia SECONDARY DIAGNOSIS:   Past Medical History:  Diagnosis Date  . Anemia 01/21/2015  . 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 Anxiety   . GERD (gastroesophageal reflux disease)   . Hypercholesteremia   . Hypertension   . Last menstrual period (LMP) > 10 days ago 2007  . Lung cancer ([place])   . Metastatic lung cancer (metastasis from lung to other site) [facility]) 01/21/2015   HOSPITAL COURSE:   61 year old female with past medical history of lung cancer currently getting treatment, hypertension, hyperlipidemia, GERD, anxiety, anemia of chronic disease who presents to the hospital due to right breast pain and redness and swelling.  1. Cellulitisof the right Breast  On IV Cefepime and vancomycin, Improving clinically, change to clindamycin on discharge.  2. Pancytopenia-secondary to recent chemotherapy and underlying lung cancer. Worsening leukopenia. Follow-up CBC and oncologist.  3. Metastatic lung cancer- follows with Dr. Grayland Ormond.  4. Anxiety-continue Xanax.  5. Neuropathy-continue gabapentin.  6. Essential hypertension-continue lisinopril/HCTZ.  7. Hyperlipidemia-continue simvastatin.  Generalized weakness. PT evaluation: HHPT.  DISCHARGE CONDITIONS:  Stable, discharge to home with home health and PT today today. CONSULTS OBTAINED:  Treatment Team:  Lloyd Huger, MD Sindy Guadeloupe, MD DRUG ALLERGIES:   Allergies  Allergen Reactions  . Tegaderm Ag Mesh [Silver] Other (See  Comments)    blisters  . Aspirin Other (See Comments)    GIB   DISCHARGE MEDICATIONS:   Allergies as of 12/26/2016      Reactions   Tegaderm Ag Mesh [silver] Other (See Comments)   blisters   Aspirin Other (See Comments)   GIB      Medication List    TAKE these medications   ALPRAZolam 1 MG tablet Commonly known as:  XANAX TAKE 1/2 BY MOUTH 1 TO 2 TIMES DAILY AS NEEDED FOR ANXIETY AND TAKE 1 BY MOUTH AT NIGHT FOR ANXIETY/SLEEP   CLARITIN 10 MG tablet Generic drug:  loratadine Take 10 mg by mouth daily.   clindamycin 300 MG capsule Commonly known as:  CLEOCIN Take 1 capsule (300 mg total) by mouth every 6 (six) hours.   dexamethasone 4 MG tablet Commonly known as:  DECADRON Take 1 tablet (4 mg total) by mouth daily.   docusate sodium 100 MG capsule Commonly known as:  COLACE Take 100 mg by mouth 2 (two) times daily as needed for mild constipation.   furosemide 20 MG tablet Commonly known as:  LASIX Take 1 tablet (20 mg total) by mouth daily.   gabapentin 300 MG capsule Commonly known as:  NEURONTIN Take 1 capsule (300 mg total) by mouth 3 (three) times daily.   lidocaine-prilocaine cream Commonly known as:  EMLA Apply cream 1 hour before chemotherapy treatment   lisinopril-hydrochlorothiazide 10-12.5 MG tablet Commonly known as:  PRINZIDE,ZESTORETIC Take 1 tablet by mouth daily.   multivitamin with minerals tablet Take 1 tablet by mouth daily.   omeprazole 20 MG capsule Commonly known as:  PRILOSEC Take 1 capsule (20 mg total) by mouth daily.   oxyCODONE  5 MG immediate release tablet Commonly known as:  Oxy IR/ROXICODONE Take 1 tablet (5 mg total) by mouth 3 (three) times daily as needed for severe pain.   potassium chloride SA 20 MEQ tablet Commonly known as:  K-DUR,KLOR-CON Take 1 tablet (20 mEq total) by mouth daily.   promethazine 25 MG tablet Commonly known as:  PHENERGAN Take 1 tablet (25 mg total) by mouth every 6 (six) hours as needed for  nausea or vomiting.   simvastatin 20 MG tablet Commonly known as:  ZOCOR Take 20 mg by mouth at bedtime.   traMADol 50 MG tablet Commonly known as:  ULTRAM Take 1 tablet (50 mg total) by mouth every 8 (eight) hours as needed for moderate pain.   vitamin B-12 1000 MCG tablet Commonly known as:  CYANOCOBALAMIN Take 1,000 mcg by mouth daily.        DISCHARGE INSTRUCTIONS:  See AVS.  If you experience worsening of your admission symptoms, develop shortness of breath, life threatening emergency, suicidal or homicidal thoughts you must seek medical attention immediately by calling 911 or calling your MD immediately  if symptoms less severe.  You Must read complete instructions/literature along with all the possible adverse reactions/side effects for all the Medicines you take and that have been prescribed to you. Take any new Medicines after you have completely understood and accpet all the possible adverse reactions/side effects.   Please note  You were cared for by a hospitalist during your hospital stay. If you have any questions about your discharge medications or the care you received while you were in the hospital after you are discharged, you can call the unit and asked to speak with the hospitalist on call if the hospitalist that took care of you is not available. Once you are discharged, your primary care physician will handle any further medical issues. Please note that NO REFILLS for any discharge medications will be authorized once you are discharged, as it is imperative that you return to your primary care physician (or establish a relationship with a primary care physician if you do not have one) for your aftercare needs so that they can reassess your need for medications and monitor your lab values.    On the day of Discharge:  VITAL SIGNS:  Blood pressure 97/69, pulse 86, temperature 98.4 F (36.9 C), temperature source Oral, resp. rate (!) 21, height '5\' 5"'$  (1.651 m),  weight 170 lb 11.2 oz (77.4 kg), SpO2 99 %. PHYSICAL EXAMINATION:  GENERAL:  61 y.o.-year-old patient lying in the bed with no acute distress.  EYES: Pupils equal, round, reactive to light and accommodation. No scleral icterus. Extraocular muscles intact.  HEENT: Head atraumatic, normocephalic. Oropharynx and nasopharynx clear.  NECK:  Supple, no jugular venous distention. No thyroid enlargement, no tenderness.  LUNGS: Normal breath sounds bilaterally, no wheezing, rales,rhonchi or crepitation. No use of accessory muscles of respiration.  CARDIOVASCULAR: S1, S2 normal. No murmurs, rubs, or gallops.  ABDOMEN: Soft, non-tender, non-distended. Bowel sounds present. No organomegaly or mass.  EXTREMITIES: No pedal edema, cyanosis, or clubbing.  NEUROLOGIC: Cranial nerves II through XII are intact. Muscle strength 5/5 in all extremities. Sensation intact. Gait not checked.  PSYCHIATRIC: The patient is alert and oriented x 3.  SKIN: No obvious rash, lesion, or ulcer. No redness and tenderness on the right breast. DATA REVIEW:   CBC  Recent Labs Lab 12/26/16 0359  WBC 3.6  HGB 10.1*  HCT 31.8*  PLT 55*    Chemistries  Recent Labs Lab 12/25/16 0515  NA 137  K 4.2  CL 101  CO2 32  GLUCOSE 130*  BUN 17  CREATININE 0.38*  CALCIUM 8.4*     Microbiology Results  Results for orders placed or performed during the hospital encounter of 12/24/16  Culture, blood (Routine X 2) w Reflex to ID Panel     Status: None (Preliminary result)   Collection Time: 12/24/16  3:50 PM  Result Value Ref Range Status   Specimen Description BLOOD  RIGHT AC  Final   Special Requests BOTTLES DRAWN AEROBIC AND ANAEROBIC  BCHV  Final   Culture NO GROWTH 2 DAYS  Final   Report Status PENDING  Incomplete  Culture, blood (Routine X 2) w Reflex to ID Panel     Status: None (Preliminary result)   Collection Time: 12/24/16  4:19 PM  Result Value Ref Range Status   Specimen Description BLOOD LEFT ASSIST  CONTROL  Final   Special Requests   Final    BOTTLES DRAWN AEROBIC AND ANAEROBIC Blood Culture adequate volume   Culture NO GROWTH 2 DAYS  Final   Report Status PENDING  Incomplete    RADIOLOGY:  No results found.   Management plans discussed with the patient, family and they are in agreement.  CODE STATUS: Full Code   TOTAL TIME TAKING CARE OF THIS PATIENT: 33 minutes.    Demetrios Loll M.D on 12/26/2016 at 1:06 PM  Between 7am to 6pm - Pager - [phone]  After 6pm go to [email or web address] - Proofreader  [facility]  Office  [phone]  CC: Primary care physician; Marguerita Merles, MD   Note: This dictation was prepared with Dragon dictation along with smaller phrase technology. Any transcriptional errors that result from this process are unintentional.

## 2016-12-26 NOTE — Progress Notes (Signed)
Pt d/ced home with husband.  She will have Home Health. Cellulitis of R. Breast is resolving.  She will continue on a 5 day course of clindamycin. WBC improved from 1.7 to 3.6.  Platelets continue to be low 55 after receiving chemo on 4/3.  Patient will be followed by Dr. Gary Fleet office. Patient gets dyspneic and tachycardic with exertion.  She is also experiencing BLE edema. D/c instructions reviewed with patient along w/f/u appts.  IV removed by nursing student.  Pt going home on O2 - chronic user.  Husband providing transport.

## 2016-12-26 NOTE — Evaluation (Signed)
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  Pt last received chemotherapy for the above stated lung cancer on December 19, 2016. She presents to the emergency room with a 1-2 day history of erythematous, warm,and swollen right breast just lateral to her port. Currently she feels well and nearly back to her baseline. Since receiving antibiotics, her cellulitis appears to have resolved. She has no neurologic complaints. She continues to have chronic shortness of breath and cough. She denies any chest pain. She denies any fevers. She has a fair appetite, but denies weight loss. She has no nausea, vomiting, constipation, or diarrhea. She has no urinary complaints. Patient otherwise feels well and offers no further specific complaints.    Clinical Impression  Pt presents with deficits in strength, gait, balance, and activity tolerance.  Pt Ind with bed mobility and transfers but did require use of bed rails for scooting to [facility] only.  Pt able to amb 1 x 40' and 1 x 61' with RW and SBA without LOB.  Pt's baseline vitals on 2LO2/min:  SpO2 99%, HR 88 bpm.  After amb 40' SpO2 98%, HR 100 bpm.  After amb 80' SpO2 98% and HR 114 bpm with pt reporting feeling tired but no light headedness or dizziness reported. Pt will benefit from PT services to address above deficits for decreased caregiver assistance upon discharge and return to prior level of function.       Follow Up Recommendations Home health PT    Equipment Recommendations  None recommended by PT    Recommendations for Other Services       Precautions / Restrictions Precautions Precautions: Fall Restrictions Weight Bearing Restrictions: No      Mobility  Bed  Mobility Overal bed mobility: Independent                Transfers Overall transfer level: Independent               General transfer comment: Pt steady and confident with sit to/from stands  Ambulation/Gait Ambulation/Gait assistance: Supervision Ambulation Distance (Feet): 80 Feet Assistive device: Rolling walker (2 wheeled) Gait Pattern/deviations: Step-through pattern   Gait velocity interpretation: Below normal speed for age/gender General Gait Details: Slow cadence with gait but steady without LOB  Stairs            Wheelchair Mobility    Modified Rankin (Stroke Patients Only)       Balance Overall balance assessment: Needs assistance Sitting-balance support: No upper extremity supported Sitting balance-Leahy Scale: Normal     Standing balance support: No upper extremity supported Standing balance-Leahy Scale: Fair Standing balance comment: Good balance with BUE support with AD                             Pertinent Vitals/Pain Pain Assessment: No/denies pain    Home Living Family/patient expects to be discharged to:: Private residence Living Arrangements: Spouse/significant other Available Help at Discharge: Family;Available 24 hours/day Type of Home: House Home Access: Level entry     Home Layout: One level Home Equipment: Walker - 4 wheels      Prior Function Level of Independence: Independent with assistive device(s)  Comments: Mod I with amb limited community distances with rollator, one fall in last 12 months with pt slipping in tub without injury, Ind with ADLs     Hand Dominance   Dominant Hand: Right    Extremity/Trunk Assessment   Upper Extremity Assessment Upper Extremity Assessment: Overall WFL for tasks assessed    Lower Extremity Assessment Lower Extremity Assessment: Generalized weakness       Communication   Communication: No difficulties  Cognition Arousal/Alertness:  Awake/alert Behavior During Therapy: WFL for tasks assessed/performed Overall Cognitive Status: Within Functional Limits for tasks assessed                                        General Comments      Exercises Other Exercises Other Exercises: Static balance training with feet apart and together with combinations of eyes open/closed and head turns/head still to pt's tolerance   Assessment/Plan    PT Assessment Patient needs continued PT services  PT Problem List Decreased strength;Decreased activity tolerance;Decreased balance       PT Treatment Interventions Gait training;Functional mobility training;Therapeutic activities;Patient/family education;Neuromuscular re-education;Balance training;Therapeutic exercise    PT Goals (Current goals can be found in the Care Plan section)  Acute Rehab PT Goals Patient Stated Goal: "Improve my strength and energy" PT Goal Formulation: With patient Time For Goal Achievement: 01/08/17 Potential to Achieve Goals: Good    Frequency Min 2X/week   Barriers to discharge        Co-evaluation               End of Session Equipment Utilized During Treatment: Gait belt;Oxygen Activity Tolerance: Patient limited by fatigue Patient left: in bed;with bed alarm set;with nursing/sitter in room Nurse Communication: Mobility status PT Visit Diagnosis: Muscle weakness (generalized) (M62.81);Difficulty in walking, not elsewhere classified (R26.2)    Time: 5361-4431 PT Time Calculation (min) (ACUTE ONLY): 33 min   Charges:   PT Evaluation $PT Eval Low Complexity: 1 Procedure PT Treatments $Therapeutic Exercise: 8-22 mins   PT G Codes:        DRoyetta Asal PT, DPT 12/26/16, 11:03 AM

## 2016-12-26 NOTE — Care Management (Signed)
Admitted to this facility with the diagnosis of cellulitis of the right breast. Lives with husband, Gwyndolyn Saxon., [phone]). Last seen Dr. Delight Stare about 3 weeks ago. Home Health per [facility] long time ago. No skilled facility. Home oxygen per [facility] x 1 week, 2 liters per nasal cannula continuous. Rolling walker in the home. Fell in tub last week. Wonderful appetite. Takes care of all basic activities of daily living herself, drives. Husband will transport. Physical therapy evaluation completed. Recommending home with home health and therapy. [place]. Floydene Flock, [facility] representative updated Discharge to home today per Dr. Haydee Monica RN MSN CCM Care Management [phone]

## 2016-12-27 ENCOUNTER — Telehealth: Payer: Self-pay | Admitting: *Deleted

## 2016-12-27 NOTE — Telephone Encounter (Signed)
States she developed a lump smaller than a golf ball on her right breast over night. Home health is supposed to be going to open her to services and she stated she will wait for them to check her

## 2016-12-29 LAB — CULTURE, BLOOD (ROUTINE X 2)
CULTURE: NO GROWTH
Culture: NO GROWTH
Special Requests: ADEQUATE

## 2017-01-01 ENCOUNTER — Telehealth: Payer: Self-pay | Admitting: *Deleted

## 2017-01-01 NOTE — Telephone Encounter (Signed)
Called to arrange with patient nursing visit to open to services, Patient advised she already has another agency in home for nursing and that PT was to go out to see her today. [facility] will not be opening her to services

## 2017-01-01 NOTE — Progress Notes (Deleted)
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  INTERVAL HISTORY: Patient returns to clinic today for further evaluation and initiation of cycle 1, day 8 of gemcitabine and vinorelbine. She tolerated her first treatment well. She does not complain of chest pain today, but still has occasional shortness of breath. Her facial swelling has resolved. She continues to have persistent weakness and fatigue. She continues to have chronic back pain. She does not complain of headache today. She denies any recent fevers. Her appetite has mildly improved. She denies any nausea, vomiting, constipation, or diarrhea. She complains of frequent urination. Patient offers no further specific complaints today.  REVIEW OF SYSTEMS:   Review of Systems  Constitutional: Positive for malaise/fatigue. Negative for fever and weight loss.  HENT: Negative.   Eyes: Negative.   Respiratory: Positive for cough and shortness of breath. Negative for hemoptysis.   Cardiovascular: Negative for chest pain and leg swelling.  Gastrointestinal: Negative for abdominal pain and nausea.  Genitourinary: Negative.   Musculoskeletal: Positive for back pain.  Neurological: Positive for weakness.  Psychiatric/Behavioral: Negative.  Negative for depression. The patient is not nervous/anxious.     As per HPI. Otherwise, a complete review of systems is negative.  PAST MEDICAL HISTORY: Past Medical History:  Diagnosis Date  . Anemia 01/21/2015  . 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 Anxiety   . GERD (gastroesophageal reflux disease)   . Hypercholesteremia   . Hypertension   . Last menstrual period (LMP) > 10 days ago 2007  . Lung  cancer ([place])   . Metastatic lung cancer (metastasis from lung to other site) [facility]) 01/21/2015    PAST SURGICAL HISTORY: Past Surgical History:  Procedure Laterality Date  . ABDOMINAL HYSTERECTOMY     partial  . INSERTION / PLACEMENT PLEURAL CATHETER    . PORTACATH PLACEMENT Right 04/26/2015   Procedure: INSERTION PORT-A-CATH;  Surgeon: Nestor Lewandowsky, MD;  Location: [facility] ORS;  Service: General;  Laterality: Right;    FAMILY HISTORY: Family History  Problem Relation Age of Onset  . Cervical cancer Mother   . CVA Mother   . Uterine cancer Maternal Grandmother   . CVA Father     ADVANCED DIRECTIVES (Y/N):  N  HEALTH MAINTENANCE: Social History  Substance Use Topics  . Smoking status: Former Smoker    Packs/day: 0.50    Years: 40.00    Types: Cigarettes    Quit date: 07/20/2014  . Smokeless tobacco: Never Used  . Alcohol use Yes     Comment: socially     Colonoscopy:  PAP:  Bone density:  Lipid panel:  Allergies  Allergen Reactions  . Tegaderm Ag Mesh [Silver] Other (See Comments)    blisters  . Aspirin Other (See Comments)    GIB    Current Outpatient Prescriptions  Medication Sig Dispense Refill  . ALPRAZolam (XANAX) 1 MG tablet TAKE 1/2 BY MOUTH 1 TO 2 TIMES DAILY AS NEEDED FOR ANXIETY AND TAKE 1 BY MOUTH AT NIGHT FOR ANXIETY/SLEEP 45 tablet 0  . clindamycin (CLEOCIN) 300 MG capsule Take 1 capsule (300 mg total) by mouth every 6 (six) hours. 20 capsule 0  . dexamethasone (DECADRON) 4 MG tablet Take 1 tablet (4 mg total) by mouth daily.  30 tablet 0  . docusate sodium (COLACE) 100 MG capsule Take 100 mg by mouth 2 (two) times daily as needed for mild constipation.    . furosemide (LASIX) 20 MG tablet Take 1 tablet (20 mg total) by mouth daily. 30 tablet 2  . gabapentin (NEURONTIN) 300 MG capsule Take 1 capsule (300 mg total) by mouth 3 (three) times daily. 270 capsule 3  . lidocaine-prilocaine (EMLA) cream Apply cream 1 hour before chemotherapy treatment 30 g 1  .  lisinopril-hydrochlorothiazide (PRINZIDE,ZESTORETIC) 10-12.5 MG per tablet Take 1 tablet by mouth daily.    [name] loratadine (CLARITIN) 10 MG tablet Take 10 mg by mouth daily.    . Multiple Vitamins-Minerals (MULTIVITAMIN WITH MINERALS) tablet Take 1 tablet by mouth daily. 30 tablet 3  . omeprazole (PRILOSEC) 20 MG capsule Take 1 capsule (20 mg total) by mouth daily. 30 capsule 0  . oxyCODONE (OXY IR/ROXICODONE) 5 MG immediate release tablet Take 1 tablet (5 mg total) by mouth 3 (three) times daily as needed for severe pain. 90 tablet 0  . potassium chloride SA (K-DUR,KLOR-CON) 20 MEQ tablet Take 1 tablet (20 mEq total) by mouth daily. 90 tablet 1  . promethazine (PHENERGAN) 25 MG tablet Take 1 tablet (25 mg total) by mouth every 6 (six) hours as needed for nausea or vomiting. (Patient not taking: Reported on 12/24/2016) 30 tablet 0  . simvastatin (ZOCOR) 20 MG tablet Take 20 mg by mouth at bedtime.     . traMADol (ULTRAM) 50 MG tablet Take 1 tablet (50 mg total) by mouth every 8 (eight) hours as needed for moderate pain. 60 tablet 0  . vitamin B-12 (CYANOCOBALAMIN) 1000 MCG tablet Take 1,000 mcg by mouth daily.     No current facility-administered medications for this visit.    Facility-Administered Medications Ordered in Other Visits  Medication Dose Route Frequency Provider Last Rate Last Dose  . heparin lock flush 100 unit/mL  250 Units Intracatheter PRN Evlyn Kanner, NP      . sodium chloride 0.9 % injection 10 mL  10 mL Intracatheter PRN Evlyn Kanner, NP      . sodium chloride 0.9 % injection 10 mL  10 mL Intravenous PRN Leia Alf, MD   10 mL at 04/27/15 0905  . sodium chloride 0.9 % injection 3 mL  3 mL Intracatheter PRN Evlyn Kanner, NP        OBJECTIVE: There were no vitals filed for this visit.   There is no height or weight on file to calculate BMI.    ECOG FS:2 - Symptomatic, <50% confined to bed  General: Well-developed, well-nourished, no acute distress. HEENT:  Swelling of right side of his resolved. Eyes: Pink conjunctiva, anicteric sclera. Lungs: Diminished breath sounds bilaterally. Heart: Regular rate and rhythm. No rubs, murmurs, or gallops. Abdomen: Soft, nontender, nondistended. No organomegaly noted, normoactive bowel sounds. Musculoskeletal: No edema, cyanosis, or clubbing. Neuro: Alert, answering all questions appropriately. Cranial nerves grossly intact. Skin: No rashes or petechiae noted. Psych: Normal affect.   LAB RESULTS:  Lab Results  Component Value Date   NA 137 12/25/2016   K 4.2 12/25/2016   CL 101 12/25/2016   CO2 32 12/25/2016   GLUCOSE 130 (H) 12/25/2016   BUN 17 12/25/2016   CREATININE 0.38 (L) 12/25/2016   CALCIUM 8.4 (L) 12/25/2016   PROT 5.4 (L) 12/19/2016   ALBUMIN 2.7 (L) 12/19/2016   AST 15 12/19/2016   ALT 17 12/19/2016   ALKPHOS 37 (L)  12/19/2016   BILITOT 0.5 12/19/2016   GFRNONAA >60 12/25/2016   GFRAA >60 12/25/2016    Lab Results  Component Value Date   WBC 3.6 12/26/2016   NEUTROABS 1.9 12/24/2016   HGB 10.1 (L) 12/26/2016   HCT 31.8 (L) 12/26/2016   MCV 83.1 12/26/2016   PLT 55 (L) 12/26/2016     STUDIES: Dg Chest 2 View  Result Date: 12/24/2016 CLINICAL DATA:  Stage four right-sided non-small cell lung cancer. EXAM: CHEST  2 VIEW COMPARISON:  11/28/2016 and chest CT 11/30/2016 FINDINGS: Right IJ Port-A-Cath unchanged. Patient slightly rotated to the right. Small to moderate right-sided pleural effusion tracking to the apex slightly worse. Persistent interstitial prominence over the mid to lower left lung and persistent hazy heterogeneous opacification of the right mid to lower lung. Cardiomediastinal silhouette and remainder of the exam is unchanged. IMPRESSION: Slight worsening moderate size right pleural effusion tracking to the apex. Stable heterogeneous opacification over the right mid to lower lung and interstitial prominence over the left mid to lower lung. Electronically Signed    By: Marin Olp M.D.   On: 12/24/2016 15:54   US Breast Ltd Uni Right Inc Axilla  Result Date: 12/29/2016 CLINICAL DATA:  Right chest and breast pain for 3 days. Right breast is red. Concern for cellulitis or possible abscess. Patient had chemotherapy treatment on Tuesday for lung cancer. Submitted for interpretation on 12/29/2016. EXAM: ULTRASOUND OF THE RIGHT BREAST COMPARISON:  CT of the chest 11/30/2016 FINDINGS: Targeted ultrasound is performed, showing significant parenchymal edema in the images provided. There is skin thickening in the images provided, demonstrating for quadrant edema. No fluid collection identified. IMPRESSION: Significant parenchymal and skin edema. Considerations include mastitis or malignancy. Further evaluation is indicated as malignancy has not been excluded. No imaged drainable fluid collection. RECOMMENDATION: Diagnostic mammography and right breast ultrasound are recommended for further evaluation. I have discussed the findings and recommendations with the patient. Results were also provided in writing at the conclusion of the visit. If applicable, a reminder letter will be sent to the patient regarding the next appointment. BI-RADS CATEGORY  0: Incomplete. Need additional imaging evaluation and/or prior mammograms for comparison. Electronically Signed   By: Nolon Nations M.D.   On: 12/29/2016 14:00    ONCOLOGY HISTORY: Stage IV ([place] NX M1) right lung non-small cell lung cancer (adenocarcinoma) with malignant right pleural effusion, lymph node and left lung metastasis. On 12/01/14 - Right Pleural Fluid Cytology. POSITIVE FOR MALIGNANT CELLS. ADENOCARCINOMA, CONSISTENT WITH LUNG ORIGIN. Molecular testing for EGFR, ALK and others is negative. Patient initially was started on carboplatinum and pemetrexed and completed 6 cycles on April 06, 2015. She was then switched to maintenance pemetrexed until December 15, 2015 at which time she was noted to have progressive disease. Patient  then received nivolumab from Jan 18, 2016 through March 28, 2016 and then had progressive disease. She then received single agent Taxotere April 18, 2016 through October 03, 2016. Patient initiated single agent Abraxane on October 30, 2016 which was discontinued after 3 cycles on November 21, 2016 for progression of disease. Patient initiated vinorelbine and gemcitabine on December 12, 2016.   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  PLAN:  1. 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 Hospice and end-of-life her once again discussed with patient,  but she is declining at this time and wishes to pursue additional treatment. Patient will receive dose reduced gemcitabine and vinorelbine on day 1 and 8 with day 15 off if her blood counts can tolerate. Proceed with cycle 1, day 8. Return to clinic in 2 weeks for consideration of cycle 2, day 1. 2. Headaches: MRI the brain completed on March 03, 2016 is essentially unchanged from previous. 3.  Pain (back, hip, arm): MRI and bone scan results reviewed independently revealing metastatic disease in C6 there is likely causing her symptoms. Lumbar MRI revealed an enhancing lesion and L2 as well as a suspicious 12 mm lesion and right sacrum. Patient has completed XRT. Continue gabapentin 300 mg 3 times per day. Continue 10 mg oxycodone as needed.  4.  Kidney lesion: Unclear if metastatic deposit or second primary of the left kidney. Continue to monitor closely. 5.  Anxiety: Resolved. Continue Xanax 1 mg at night and 0.5 mg as needed during the day. 6.  Hypokalemia: Patient's potassium within normal limits. Continue oral potassium supplementation. 7.  Bony lesions: Consider Zometa in the future. 8.  Peripheral edema: Continue Lasix PRN. Will  use with caution given patient's history of dehydration. 9.  Shortness of breath: Improved with Decadron. CT as above with progression of disease. 10. Facial swelling: Resolved. 11. Home health needs: Given patient's persistent weakness and fatigue as well as declining performance status have recommended home health evaluation for assistance.   Patient expressed understanding and was in agreement with this plan. She also understands that She can call clinic at any time with any questions, concerns, or complaints.   Cancer Staging Metastatic lung cancer (metastasis from lung to other site) [facility]) Staging form: Lung, AJCC 7th Edition - Clinical: Stage Unknown ([place], NX, M1) - Signed by Evlyn Kanner, NP on 01/21/2015   Lloyd Huger, MD   01/01/2017 9:54 PM

## 2017-01-02 ENCOUNTER — Emergency Department: Payer: BLUE CROSS/BLUE SHIELD

## 2017-01-02 ENCOUNTER — Inpatient Hospital Stay
Admission: EM | Admit: 2017-01-02 | Discharge: 2017-01-04 | DRG: 193 | Disposition: A | Discharge status: Home Health | Payer: BLUE CROSS/BLUE SHIELD | Attending: Internal Medicine | Admitting: Internal Medicine

## 2017-01-02 ENCOUNTER — Encounter: Payer: Self-pay | Admitting: Emergency Medicine

## 2017-01-02 ENCOUNTER — Inpatient Hospital Stay: Payer: BLUE CROSS/BLUE SHIELD

## 2017-01-02 ENCOUNTER — Inpatient Hospital Stay: Payer: BLUE CROSS/BLUE SHIELD | Admitting: Oncology

## 2017-01-02 ENCOUNTER — Other Ambulatory Visit: Payer: Self-pay

## 2017-01-02 DIAGNOSIS — J910 Malignant pleural effusion: Secondary | ICD-10-CM | POA: Diagnosis present

## 2017-01-02 DIAGNOSIS — Y95 Nosocomial condition: Secondary | ICD-10-CM | POA: Diagnosis present

## 2017-01-02 DIAGNOSIS — Z79890 Hormone replacement therapy (postmenopausal): Secondary | ICD-10-CM | POA: Diagnosis not present

## 2017-01-02 DIAGNOSIS — N61 Mastitis without abscess: Secondary | ICD-10-CM | POA: Diagnosis present

## 2017-01-02 DIAGNOSIS — D630 Anemia in neoplastic disease: Secondary | ICD-10-CM | POA: Diagnosis present

## 2017-01-02 DIAGNOSIS — J189 Pneumonia, unspecified organism: Principal | ICD-10-CM | POA: Diagnosis present

## 2017-01-02 DIAGNOSIS — Z87891 Personal history of nicotine dependence: Secondary | ICD-10-CM | POA: Diagnosis not present

## 2017-01-02 DIAGNOSIS — C7951 Secondary malignant neoplasm of bone: Secondary | ICD-10-CM | POA: Diagnosis not present

## 2017-01-02 DIAGNOSIS — Z8049 Family history of malignant neoplasm of other genital organs: Secondary | ICD-10-CM | POA: Diagnosis not present

## 2017-01-02 DIAGNOSIS — T451X5A Adverse effect of antineoplastic and immunosuppressive drugs, initial encounter: Secondary | ICD-10-CM | POA: Diagnosis present

## 2017-01-02 DIAGNOSIS — Z886 Allergy status to analgesic agent status: Secondary | ICD-10-CM | POA: Diagnosis not present

## 2017-01-02 DIAGNOSIS — R0602 Shortness of breath: Secondary | ICD-10-CM

## 2017-01-02 DIAGNOSIS — J9620 Acute and chronic respiratory failure, unspecified whether with hypoxia or hypercapnia: Secondary | ICD-10-CM | POA: Diagnosis present

## 2017-01-02 DIAGNOSIS — C3491 Malignant neoplasm of unspecified part of right bronchus or lung: Secondary | ICD-10-CM | POA: Diagnosis present

## 2017-01-02 DIAGNOSIS — E785 Hyperlipidemia, unspecified: Secondary | ICD-10-CM | POA: Diagnosis present

## 2017-01-02 DIAGNOSIS — I10 Essential (primary) hypertension: Secondary | ICD-10-CM | POA: Diagnosis present

## 2017-01-02 DIAGNOSIS — E7800 Pure hypercholesterolemia, unspecified: Secondary | ICD-10-CM | POA: Diagnosis present

## 2017-01-02 DIAGNOSIS — D638 Anemia in other chronic diseases classified elsewhere: Secondary | ICD-10-CM | POA: Diagnosis present

## 2017-01-02 DIAGNOSIS — Z90710 Acquired absence of both cervix and uterus: Secondary | ICD-10-CM | POA: Diagnosis not present

## 2017-01-02 DIAGNOSIS — D649 Anemia, unspecified: Secondary | ICD-10-CM | POA: Diagnosis not present

## 2017-01-02 DIAGNOSIS — Z9221 Personal history of antineoplastic chemotherapy: Secondary | ICD-10-CM | POA: Diagnosis not present

## 2017-01-02 DIAGNOSIS — C771 Secondary and unspecified malignant neoplasm of intrathoracic lymph nodes: Secondary | ICD-10-CM | POA: Diagnosis present

## 2017-01-02 DIAGNOSIS — Z79899 Other long term (current) drug therapy: Secondary | ICD-10-CM

## 2017-01-02 DIAGNOSIS — Z91048 Other nonmedicinal substance allergy status: Secondary | ICD-10-CM

## 2017-01-02 DIAGNOSIS — D6481 Anemia due to antineoplastic chemotherapy: Secondary | ICD-10-CM | POA: Diagnosis present

## 2017-01-02 DIAGNOSIS — Z85118 Personal history of other malignant neoplasm of bronchus and lung: Secondary | ICD-10-CM | POA: Diagnosis not present

## 2017-01-02 DIAGNOSIS — J9610 Chronic respiratory failure, unspecified whether with hypoxia or hypercapnia: Secondary | ICD-10-CM | POA: Diagnosis present

## 2017-01-02 DIAGNOSIS — D72819 Decreased white blood cell count, unspecified: Secondary | ICD-10-CM | POA: Diagnosis present

## 2017-01-02 DIAGNOSIS — R531 Weakness: Secondary | ICD-10-CM | POA: Diagnosis not present

## 2017-01-02 DIAGNOSIS — C3492 Malignant neoplasm of unspecified part of left bronchus or lung: Secondary | ICD-10-CM | POA: Diagnosis present

## 2017-01-02 DIAGNOSIS — R5383 Other fatigue: Secondary | ICD-10-CM | POA: Diagnosis not present

## 2017-01-02 DIAGNOSIS — Z888 Allergy status to other drugs, medicaments and biological substances status: Secondary | ICD-10-CM | POA: Diagnosis not present

## 2017-01-02 DIAGNOSIS — Z9889 Other specified postprocedural states: Secondary | ICD-10-CM

## 2017-01-02 DIAGNOSIS — Z7952 Long term (current) use of systemic steroids: Secondary | ICD-10-CM

## 2017-01-02 DIAGNOSIS — C7802 Secondary malignant neoplasm of left lung: Secondary | ICD-10-CM | POA: Diagnosis not present

## 2017-01-02 DIAGNOSIS — Z9981 Dependence on supplemental oxygen: Secondary | ICD-10-CM

## 2017-01-02 DIAGNOSIS — J90 Pleural effusion, not elsewhere classified: Secondary | ICD-10-CM

## 2017-01-02 DIAGNOSIS — R5381 Other malaise: Secondary | ICD-10-CM | POA: Diagnosis not present

## 2017-01-02 DIAGNOSIS — K219 Gastro-esophageal reflux disease without esophagitis: Secondary | ICD-10-CM | POA: Diagnosis present

## 2017-01-02 LAB — URINALYSIS, COMPLETE (UACMP) WITH MICROSCOPIC
BACTERIA UA: NONE SEEN
BILIRUBIN URINE: NEGATIVE
Glucose, UA: NEGATIVE mg/dL
Hgb urine dipstick: NEGATIVE
KETONES UR: NEGATIVE mg/dL
Leukocytes, UA: NEGATIVE
Nitrite: NEGATIVE
PROTEIN: NEGATIVE mg/dL
Specific Gravity, Urine: 1.024 (ref 1.005–1.030)
pH: 5 (ref 5.0–8.0)

## 2017-01-02 LAB — COMPREHENSIVE METABOLIC PANEL
ALBUMIN: 2.5 g/dL — AB (ref 3.5–5.0)
ALT: 21 U/L (ref 14–54)
AST: 13 U/L — AB (ref 15–41)
Alkaline Phosphatase: 35 U/L — ABNORMAL LOW (ref 38–126)
Anion gap: 7 (ref 5–15)
BUN: 20 mg/dL (ref 6–20)
CO2: 30 mmol/L (ref 22–32)
CREATININE: 0.63 mg/dL (ref 0.44–1.00)
Calcium: 8.2 mg/dL — ABNORMAL LOW (ref 8.9–10.3)
Chloride: 100 mmol/L — ABNORMAL LOW (ref 101–111)
GFR calc Af Amer: >60 mL/min (ref >60)
GLUCOSE: 88 mg/dL (ref 65–99)
Potassium: 3.8 mmol/L (ref 3.5–5.1)
SODIUM: 137 mmol/L (ref 135–145)
Total Bilirubin: 0.6 mg/dL (ref 0.3–1.2)
Total Protein: 5.3 g/dL — ABNORMAL LOW (ref 6.5–8.1)

## 2017-01-02 LAB — CBC WITH DIFFERENTIAL/PLATELET
BASOS ABS: 0.1 10*3/uL (ref 0–0.1)
BASOS PCT: 1 %
EOS ABS: 0.2 10*3/uL (ref 0–0.7)
EOS PCT: 3 %
HCT: 32.4 % — ABNORMAL LOW (ref 35.0–47.0)
Hemoglobin: 10.6 g/dL — ABNORMAL LOW (ref 12.0–16.0)
Lymphocytes Relative: 22 %
Lymphs Abs: 1.6 10*3/uL (ref 1.0–3.6)
MCH: 27.2 pg (ref 26.0–34.0)
MCHC: 32.6 g/dL (ref 32.0–36.0)
MCV: 83.2 fL (ref 80.0–100.0)
MONO ABS: 1.4 10*3/uL — AB (ref 0.2–0.9)
Monocytes Relative: 19 %
Neutro Abs: 3.9 10*3/uL (ref 1.4–6.5)
Neutrophils Relative %: 55 %
PLATELETS: 579 10*3/uL — AB (ref 150–440)
RBC: 3.89 MIL/uL (ref 3.80–5.20)
RDW: 18.6 % — AB (ref 11.5–14.5)
WBC: 7.1 10*3/uL (ref 3.6–11.0)

## 2017-01-02 MED ORDER — POTASSIUM CHLORIDE CRYS ER 20 MEQ PO TBCR
20.0000 meq | EXTENDED_RELEASE_TABLET | Freq: Every day | ORAL | Status: DC
  Administered 2017-01-02 – 2017-01-04 (×3): 20 meq via ORAL
  Filled 2017-01-02 (×3): qty 1

## 2017-01-02 MED ORDER — ONDANSETRON HCL 4 MG PO TABS: 4.0000 mg | ORAL_TABLET | Freq: Four times a day (QID) | ORAL | Status: DC | PRN

## 2017-01-02 MED ORDER — SODIUM CHLORIDE 0.9 % IV SOLN: 250.0000 mL | INTRAVENOUS | Status: DC | PRN

## 2017-01-02 MED ORDER — SODIUM CHLORIDE 0.9% FLUSH
3.0000 mL | Freq: Two times a day (BID) | INTRAVENOUS | Status: DC
  Administered 2017-01-02 – 2017-01-04 (×5): 3 mL via INTRAVENOUS

## 2017-01-02 MED ORDER — CLINDAMYCIN HCL 150 MG PO CAPS
300.0000 mg | ORAL_CAPSULE | Freq: Three times a day (TID) | ORAL | Status: DC
  Administered 2017-01-02 – 2017-01-04 (×6): 300 mg via ORAL
  Filled 2017-01-02 (×6): qty 2

## 2017-01-02 MED ORDER — DIPHENHYDRAMINE HCL 25 MG PO CAPS: 25.0000 mg | ORAL_CAPSULE | Freq: Every evening | ORAL | Status: DC | PRN

## 2017-01-02 MED ORDER — LEVOFLOXACIN IN D5W 750 MG/150ML IV SOLN: 750.0000 mg | INTRAVENOUS | Status: DC

## 2017-01-02 MED ORDER — OXYCODONE HCL 5 MG PO TABS
5.0000 mg | ORAL_TABLET | Freq: Three times a day (TID) | ORAL | Status: DC | PRN
  Administered 2017-01-03 (×2): 5 mg via ORAL
  Filled 2017-01-02 (×2): qty 1

## 2017-01-02 MED ORDER — LORATADINE 10 MG PO TABS
10.0000 mg | ORAL_TABLET | Freq: Every day | ORAL | Status: DC
  Administered 2017-01-02 – 2017-01-04 (×3): 10 mg via ORAL
  Filled 2017-01-02 (×3): qty 1

## 2017-01-02 MED ORDER — FUROSEMIDE 20 MG PO TABS
20.0000 mg | ORAL_TABLET | Freq: Every day | ORAL | Status: DC
  Administered 2017-01-02 – 2017-01-04 (×3): 20 mg via ORAL
  Filled 2017-01-02 (×3): qty 1

## 2017-01-02 MED ORDER — DIPHENHYDRAMINE-APAP (SLEEP) 25-500 MG PO TABS: 1.0000 | ORAL_TABLET | Freq: Every evening | ORAL | Status: DC | PRN

## 2017-01-02 MED ORDER — SIMVASTATIN 20 MG PO TABS
20.0000 mg | ORAL_TABLET | Freq: Every day | ORAL | Status: DC
  Administered 2017-01-02 – 2017-01-03 (×2): 20 mg via ORAL
  Filled 2017-01-02 (×2): qty 1

## 2017-01-02 MED ORDER — PANTOPRAZOLE SODIUM 40 MG PO TBEC
40.0000 mg | DELAYED_RELEASE_TABLET | Freq: Every day | ORAL | Status: DC
Start: 2017-01-03 — End: 2017-01-04
  Administered 2017-01-03 – 2017-01-04 (×2): 40 mg via ORAL
  Filled 2017-01-02 (×2): qty 1

## 2017-01-02 MED ORDER — ONDANSETRON HCL 4 MG/2ML IJ SOLN: 4.0000 mg | Freq: Four times a day (QID) | INTRAMUSCULAR | Status: DC | PRN

## 2017-01-02 MED ORDER — GABAPENTIN 300 MG PO CAPS
300.0000 mg | ORAL_CAPSULE | Freq: Three times a day (TID) | ORAL | Status: DC
  Administered 2017-01-02 – 2017-01-04 (×6): 300 mg via ORAL
  Filled 2017-01-02 (×6): qty 1

## 2017-01-02 MED ORDER — ACETAMINOPHEN 325 MG PO TABS
650.0000 mg | ORAL_TABLET | Freq: Four times a day (QID) | ORAL | Status: DC | PRN
  Administered 2017-01-03: 650 mg via ORAL
  Filled 2017-01-02: qty 2

## 2017-01-02 MED ORDER — LEVOFLOXACIN IN D5W 750 MG/150ML IV SOLN
750.0000 mg | INTRAVENOUS | Status: DC
  Administered 2017-01-02: 22:00:00 750 mg via INTRAVENOUS
  Filled 2017-01-02 (×3): qty 150

## 2017-01-02 MED ORDER — ALPRAZOLAM 0.5 MG PO TABS: 0.5000 mg | ORAL_TABLET | Freq: Three times a day (TID) | ORAL | Status: DC | PRN

## 2017-01-02 MED ORDER — SODIUM CHLORIDE 0.9% FLUSH: 3.0000 mL | INTRAVENOUS | Status: DC | PRN

## 2017-01-02 MED ORDER — CEFEPIME-DEXTROSE 1 GM/50ML IV SOLR: 1.0000 g | Freq: Once | INTRAVENOUS | Status: DC

## 2017-01-02 MED ORDER — TRAMADOL HCL 50 MG PO TABS: 50.0000 mg | ORAL_TABLET | Freq: Three times a day (TID) | ORAL | Status: DC | PRN

## 2017-01-02 MED ORDER — ENOXAPARIN SODIUM 40 MG/0.4ML ~~LOC~~ SOLN
40.0000 mg | SUBCUTANEOUS | Status: DC
  Administered 2017-01-02 – 2017-01-03 (×2): 40 mg via SUBCUTANEOUS
  Filled 2017-01-02 (×2): qty 0.4

## 2017-01-02 MED ORDER — ALBUTEROL SULFATE (2.5 MG/3ML) 0.083% IN NEBU
2.5000 mg | INHALATION_SOLUTION | RESPIRATORY_TRACT | Status: DC | PRN
  Administered 2017-01-03: 02:00:00 2.5 mg via RESPIRATORY_TRACT
  Filled 2017-01-02: qty 3

## 2017-01-02 MED ORDER — VANCOMYCIN HCL IN DEXTROSE 1-5 GM/200ML-% IV SOLN: 1000.0000 mg | Freq: Once | INTRAVENOUS | Status: DC

## 2017-01-02 MED ORDER — DOCUSATE SODIUM 100 MG PO CAPS
100.0000 mg | ORAL_CAPSULE | Freq: Two times a day (BID) | ORAL | Status: DC
  Administered 2017-01-02 – 2017-01-04 (×4): 100 mg via ORAL
  Filled 2017-01-02 (×4): qty 1

## 2017-01-02 MED ORDER — POLYETHYLENE GLYCOL 3350 17 G PO PACK: 17.0000 g | PACK | Freq: Every day | ORAL | Status: DC | PRN

## 2017-01-02 MED ORDER — SODIUM CHLORIDE 0.9 % IV BOLUS (SEPSIS)
1000.0000 mL | Freq: Once | INTRAVENOUS | Status: AC
Start: 2017-01-02 — End: 2017-01-02
  Administered 2017-01-02: 1000 mL via INTRAVENOUS

## 2017-01-02 MED ORDER — ACETAMINOPHEN 650 MG RE SUPP: 650.0000 mg | Freq: Four times a day (QID) | RECTAL | Status: DC | PRN

## 2017-01-02 NOTE — ED Notes (Signed)
Assisted pt to void in bedpan - urine spilled I bed so unable to obtain sample at this time

## 2017-01-02 NOTE — H&P (Signed)
[facility] NAME: Patricia Kelley    MR#:  [id number]  DATE OF BIRTH:  09-13-1956  DATE OF ADMISSION:  01/02/2017  PRIMARY CARE PHYSICIAN: Marguerita Merles, MD   REQUESTING/REFERRING PHYSICIAN: Dr. Caffie Pinto  CHIEF COMPLAINT:   Chief Complaint  Patient presents with  . Weakness    HISTORY OF PRESENT ILLNESS:  Patricia Kelley  is a 61 y.o. 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 She does have chronic shortness of breath and cough on 2 L oxygen which is unchanged. Here in the emergency room patient has been found to have bilateral infiltrates on the chest x-ray also concerning for possible lymphangitic spread. With high suspicion for pneumonia patient is being admitted to the hospitalist service. Patient was supposed to get her chemotherapy today with Dr. Grayland Ormond but presented to the hospital due to weakness. At baseline she walks without rolling walker. She worked with home health physical therapy yesterday and did well. Today she was unable to walk in the morning due to generalized weakness.  PAST MEDICAL HISTORY:   Past Medical History:  Diagnosis Date  . Anemia 01/21/2015  . 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 Anxiety   . GERD (gastroesophageal reflux disease)   . Hypercholesteremia   . Hypertension   . Last menstrual period (LMP) > 10 days ago 2007  . Lung cancer ([place])   . Metastatic lung cancer (metastasis from lung to other site) [facility]) 01/21/2015    PAST SURGICAL HISTORY:   Past Surgical History:  Procedure Laterality Date  . ABDOMINAL HYSTERECTOMY     partial  . INSERTION / PLACEMENT PLEURAL CATHETER    . PORTACATH PLACEMENT Right 04/26/2015   Procedure: INSERTION PORT-A-CATH;  Surgeon: Nestor Lewandowsky, MD;  Location: [facility] ORS;  Service: General;  Laterality: Right;     SOCIAL HISTORY:   Social History  Substance Use Topics  . Smoking status: Former Smoker    Packs/day: 0.50    Years: 40.00    Types: Cigarettes    Quit date: 07/20/2014  . Smokeless tobacco: Never Used  . Alcohol use Yes     Comment: socially    FAMILY HISTORY:   Family History  Problem Relation Age of Onset  . Cervical cancer Mother   . CVA Mother   . Uterine cancer Maternal Grandmother   . CVA Father     DRUG ALLERGIES:   Allergies  Allergen Reactions  . Tegaderm Ag Mesh [Silver] Other (See Comments)    blisters  . Aspirin Other (See Comments)    GIB    REVIEW OF SYSTEMS:   Review of Systems  Constitutional: Positive for malaise/fatigue. Negative for chills, fever and weight loss.  HENT: Negative for hearing loss and nosebleeds.   Eyes: Negative for blurred vision, double vision and pain.  Respiratory: Positive for cough and shortness of breath. Negative for hemoptysis, sputum production and wheezing.   Cardiovascular: Negative for chest pain, palpitations, orthopnea and leg swelling.  Gastrointestinal: Negative for abdominal pain, constipation, diarrhea, nausea and vomiting.  Genitourinary: Negative for dysuria and hematuria.  Musculoskeletal: Negative for back pain, falls and myalgias.  Skin: Negative for rash.  Neurological: Positive for weakness. Negative for dizziness, tremors, sensory change, speech change, focal weakness, seizures and headaches.  Endo/Heme/Allergies: Does not bruise/bleed easily.  Psychiatric/Behavioral: Negative for depression and memory  loss. The patient is not nervous/anxious.     MEDICATIONS AT HOME:   Prior to Admission medications   Medication Sig Start Date End Date Taking? Authorizing Provider  ALPRAZolam (XANAX) 1 MG tablet TAKE 1/2 BY MOUTH 1 TO 2 TIMES DAILY AS NEEDED FOR ANXIETY AND TAKE 1 BY MOUTH AT NIGHT FOR ANXIETY/SLEEP 12/04/16  Yes Lloyd Huger, MD  clindamycin (CLEOCIN) 300 MG capsule Take 1 capsule (300  mg total) by mouth every 6 (six) hours. 12/26/16  Yes Demetrios Loll, MD  diphenhydramine-acetaminophen (TYLENOL PM) 25-500 MG TABS tablet Take 1 tablet by mouth at bedtime as needed (sleep).   Yes Historical Provider, MD  docusate sodium (COLACE) 100 MG capsule Take 100 mg by mouth 2 (two) times daily as needed for mild constipation.   Yes Historical Provider, MD  furosemide (LASIX) 20 MG tablet Take 1 tablet (20 mg total) by mouth daily. 06/21/16  Yes Lloyd Huger, MD  gabapentin (NEURONTIN) 300 MG capsule Take 1 capsule (300 mg total) by mouth 3 (three) times daily. 05/16/16  Yes Lloyd Huger, MD  lidocaine-prilocaine (EMLA) cream Apply cream 1 hour before chemotherapy treatment 10/19/15  Yes Lloyd Huger, MD  lisinopril-hydrochlorothiazide (PRINZIDE,ZESTORETIC) 10-12.5 MG per tablet Take 1 tablet by mouth daily.   Yes Historical Provider, MD  loratadine (CLARITIN) 10 MG tablet Take 10 mg by mouth daily.   Yes Historical Provider, MD  Multiple Vitamins-Minerals (MULTIVITAMIN WITH MINERALS) tablet Take 1 tablet by mouth daily. 04/27/15  Yes Leia Alf, MD  omeprazole (PRILOSEC) 20 MG capsule Take 1 capsule (20 mg total) by mouth daily. 05/17/16  Yes Lytle Butte, MD  oxyCODONE (OXY IR/ROXICODONE) 5 MG immediate release tablet Take 1 tablet (5 mg total) by mouth 3 (three) times daily as needed for severe pain. 09/19/16  Yes Lloyd Huger, MD  potassium chloride SA (K-DUR,KLOR-CON) 20 MEQ tablet Take 1 tablet (20 mEq total) by mouth daily. 05/09/16  Yes Lloyd Huger, MD  promethazine (PHENERGAN) 25 MG tablet Take 1 tablet (25 mg total) by mouth every 6 (six) hours as needed for nausea or vomiting. 08/09/16  Yes Cammie Sickle, MD  simvastatin (ZOCOR) 20 MG tablet Take 20 mg by mouth at bedtime.    Yes Historical Provider, MD  traMADol (ULTRAM) 50 MG tablet Take 1 tablet (50 mg total) by mouth every 8 (eight) hours as needed for moderate pain. 11/27/16  Yes Lloyd Huger, MD   vitamin B-12 (CYANOCOBALAMIN) 1000 MCG tablet Take 1,000 mcg by mouth daily.   Yes Historical Provider, MD     VITAL SIGNS:  Blood pressure 97/63, pulse 100, temperature 98.1 F (36.7 C), temperature source Oral, resp. rate 16, height '5\' 5"'$  (1.651 m), weight 77.1 kg (170 lb), SpO2 99 %.  PHYSICAL EXAMINATION:  Physical Exam  GENERAL:  61 y.o.-year-old patient lying in the bed with no acute distress.  EYES: Pupils equal, round, reactive to light and accommodation. No scleral icterus. Extraocular muscles intact.  HEENT: Head atraumatic, normocephalic. Oropharynx and nasopharynx clear. No oropharyngeal erythema, moist oral mucosa  NECK:  Supple, no jugular venous distention. No thyroid enlargement, no tenderness.  LUNGS: bilateral ronchi at bases CARDIOVASCULAR: S1, S2 normal. No murmurs, rubs, or gallops.  ABDOMEN: Soft, nontender, nondistended. Bowel sounds present. No organomegaly or mass.  EXTREMITIES: No pedal edema, cyanosis, or clubbing. + 2 pedal & radial pulses b/l.   NEUROLOGIC: Cranial nerves II through XII are intact. No focal Motor or sensory deficits  appreciated b/l PSYCHIATRIC: The patient is alert and oriented x 3. Good affect.  SKIN: No obvious rash, lesion, or ulcer.   LABORATORY PANEL:   CBC  Recent Labs Lab 01/02/17 1040  WBC 7.1  HGB 10.6*  HCT 32.4*  PLT 579*   ------------------------------------------------------------------------------------------------------------------  Chemistries   Recent Labs Lab 01/02/17 1040  NA 137  K 3.8  CL 100*  CO2 30  GLUCOSE 88  BUN 20  CREATININE 0.63  CALCIUM 8.2*  AST 13*  ALT 21  ALKPHOS 35*  BILITOT 0.6   ------------------------------------------------------------------------------------------------------------------  Cardiac Enzymes No results for input(s): TROPONINI in the last 168  hours. ------------------------------------------------------------------------------------------------------------------  RADIOLOGY:  Dg Chest Portable 1 View  Result Date: 01/02/2017 CLINICAL DATA:  Stage IV lung malignancy. Unable to receive chemotherapy today due to generalized weakness. Currently cleaning being treated with clindamycin for breast infection. Known metastatic lung malignancy. EXAM: PORTABLE CHEST 1 VIEW COMPARISON:  PA and lateral chest x-ray of December 24, 2016 FINDINGS: There is persistent volume loss on the right. The interstitial markings remain increased. A moderate sized right pleural effusion laterally and inferiorly persists. On the left the interstitial markings are slightly more conspicuous overall. The cardiac silhouette is not enlarged. There is calcification in the wall of the aortic arch. The central pulmonary vascularity is prominent. The bony thorax exhibits no acute abnormality. IMPRESSION: Persistent increased interstitial markings in the right lung with moderate-sized right pleural effusion. Slight interval worsening in the appearance of the left lung interstitial markings. The findings may reflect asymmetric pulmonary edema or worsening pneumonia. Lymphangitic spread of malignancy could present in a similar fashion. Electronically Signed   By: David  Martinique M.D.   On: 01/02/2017 12:02     IMPRESSION AND PLAN:   * Bilateral infiltrates concerning for pneumonia. This could also be lymphangitic spread. We'll start patient on IV Levaquin. She is already on clindamycin for recent breast cellulitis which will be continued. We'll check blood cultures.  * Chronic respiratory failure is stable.  * Hypertension. Hold blood pressure medications as patient continues to have low normal blood pressure.  * Stage IV lung cancer. Concern for lymphangitic spread on chest x-ray. We will consult oncology.  * Anemia of chronic disease is stable  * DVT prophylaxis -  Lovenox  All the records are reviewed and case discussed with ED provider. Management plans discussed with the patient, family and they are in agreement.  CODE STATUS: FULL  TOTAL TIME TAKING CARE OF THIS PATIENT: 40 minutes.   Hillary Bow R M.D on 01/02/2017 at 12:42 PM  Between 7am to 6pm - Pager - [phone]  After 6pm go to [email or web address] - password [email or web address]  Office  [phone]  CC: Primary care physician; Marguerita Merles, MD  Note: This dictation was prepared with Dragon dictation along with smaller phrase technology. Any transcriptional errors that result from this process are unintentional.

## 2017-01-02 NOTE — ED Provider Notes (Signed)
[facility] Emergency Department Provider Note  ____________________________________________  Time seen: Approximately 11:34 AM  I have reviewed the triage vital signs and the nursing notes.   HISTORY  Chief Complaint Weakness   HPI Patricia Kelley is a 61 y.o. female with a history of metastatic lung cancer on chemotherapy, hypertension, hyperlipidemia, and anemia who presents for evaluation of generalized weakness. Patient was scheduled for her weekly chemotherapy today and canceled because she says she was too tired to be able to get up from the bed. She reports that she went to bed yesterday she was in her usual state of health. She denies fever or chills. She has had normal appetite. No nausea or vomiting, no diarrhea or dysuria, no chest pain or shortness of breath, no HA, no abdominal pain. She does have a cough that started today which is dry. No sore throat or body aches.  Past Medical History:  Diagnosis Date  . Anemia 01/21/2015  . 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 Anxiety   . GERD (gastroesophageal reflux disease)   . Hypercholesteremia   . Hypertension   . Last menstrual period (LMP) > 10 days ago 2007  . Lung cancer ([place])   . Metastatic lung cancer (metastasis from lung to other site) [facility]) 01/21/2015    Patient Active Problem List   Diagnosis Date Noted  . Cellulitis of right breast 12/24/2016  . Goals of care, counseling/discussion 12/03/2016  . Chest pain 05/16/2016  . Metastatic lung cancer (metastasis from lung to other site) ([place]) 01/21/2015  . 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 Empyema of lung ([place]) 01/13/2015  . MRSA (methicillin resistant Staphylococcus aureus) infection 01/13/2015  . Malignant neoplasm of overlapping sites of right lung ([place]) 01/13/2015    Past Surgical History:  Procedure Laterality Date  . ABDOMINAL HYSTERECTOMY     partial  . INSERTION / PLACEMENT PLEURAL CATHETER    . PORTACATH  PLACEMENT Right 04/26/2015   Procedure: INSERTION PORT-A-CATH;  Surgeon: Nestor Lewandowsky, MD;  Location: [facility] ORS;  Service: General;  Laterality: Right;    Prior to Admission medications   Medication Sig Start Date End Date Taking? Authorizing Provider  ALPRAZolam (XANAX) 1 MG tablet TAKE 1/2 BY MOUTH 1 TO 2 TIMES DAILY AS NEEDED FOR ANXIETY AND TAKE 1 BY MOUTH AT NIGHT FOR ANXIETY/SLEEP 12/04/16   Lloyd Huger, MD  clindamycin (CLEOCIN) 300 MG capsule Take 1 capsule (300 mg total) by mouth every 6 (six) hours. 12/26/16   Demetrios Loll, MD  dexamethasone (DECADRON) 4 MG tablet Take 1 tablet (4 mg total) by mouth daily. 11/24/16   Lloyd Huger, MD  docusate sodium (COLACE) 100 MG capsule Take 100 mg by mouth 2 (two) times daily as needed for mild constipation.    Historical Provider, MD  furosemide (LASIX) 20 MG tablet Take 1 tablet (20 mg total) by mouth daily. 06/21/16   Lloyd Huger, MD  gabapentin (NEURONTIN) 300 MG capsule Take 1 capsule (300 mg total) by mouth 3 (three) times daily. 05/16/16   Lloyd Huger, MD  lidocaine-prilocaine (EMLA) cream Apply cream 1 hour before chemotherapy treatment 10/19/15   Lloyd Huger, MD  lisinopril-hydrochlorothiazide (PRINZIDE,ZESTORETIC) 10-12.5 MG per tablet Take 1 tablet by mouth daily.    Historical Provider, MD  loratadine (CLARITIN) 10 MG tablet Take 10 mg by mouth daily.    Historical Provider, MD  Multiple Vitamins-Minerals (MULTIVITAMIN WITH MINERALS) tablet Take 1 tablet by mouth daily. 04/27/15  Leia Alf, MD  omeprazole (PRILOSEC) 20 MG capsule Take 1 capsule (20 mg total) by mouth daily. 05/17/16   Lytle Butte, MD  oxyCODONE (OXY IR/ROXICODONE) 5 MG immediate release tablet Take 1 tablet (5 mg total) by mouth 3 (three) times daily as needed for severe pain. 09/19/16   Lloyd Huger, MD  potassium chloride SA (K-DUR,KLOR-CON) 20 MEQ tablet Take 1 tablet (20 mEq total) by mouth daily. 05/09/16   Lloyd Huger, MD    promethazine (PHENERGAN) 25 MG tablet Take 1 tablet (25 mg total) by mouth every 6 (six) hours as needed for nausea or vomiting. Patient not taking: Reported on 12/24/2016 08/09/16   Cammie Sickle, MD  simvastatin (ZOCOR) 20 MG tablet Take 20 mg by mouth at bedtime.     Historical Provider, MD  traMADol (ULTRAM) 50 MG tablet Take 1 tablet (50 mg total) by mouth every 8 (eight) hours as needed for moderate pain. 11/27/16   Lloyd Huger, MD  vitamin B-12 (CYANOCOBALAMIN) 1000 MCG tablet Take 1,000 mcg by mouth daily.    Historical Provider, MD    Allergies Tegaderm ag mesh [silver] and Aspirin  Family History  Problem Relation Age of Onset  . Cervical cancer Mother   . CVA Mother   . Uterine cancer Maternal Grandmother   . CVA Father     Social History Social History  Substance Use Topics  . Smoking status: Former Smoker    Packs/day: 0.50    Years: 40.00    Types: Cigarettes    Quit date: 07/20/2014  . Smokeless tobacco: Never Used  . Alcohol use Yes     Comment: socially    Review of Systems  Constitutional: Negative for fever. + generalized weakness Eyes: Negative for visual changes. ENT: Negative for sore throat. Neck: No neck pain  Cardiovascular: Negative for chest pain. Respiratory: Negative for shortness of breath. + cough Gastrointestinal: Negative for abdominal pain, vomiting or diarrhea. Genitourinary: Negative for dysuria. Musculoskeletal: Negative for back pain. Skin: Negative for rash. Neurological: Negative for headaches, weakness or numbness. Psych: No SI or HI  ____________________________________________   PHYSICAL EXAM:  VITAL SIGNS: ED Triage Vitals  Enc Vitals Group     BP 01/02/17 1033 97/63     Pulse Rate 01/02/17 1033 100     Resp 01/02/17 1033 16     Temp 01/02/17 1033 98.1 F (36.7 C)     Temp Source 01/02/17 1033 Oral     SpO2 01/02/17 1033 99 %     Weight 01/02/17 1028 170 lb (77.1 kg)     Height 01/02/17 1028 '5\' 5"'$   (1.651 m)     Head Circumference --      Peak Flow --      Pain Score 01/02/17 1032 0     Pain Loc --      Pain Edu? --      Excl. in [place]? --     Constitutional: Alert and oriented. Well appearing and in no apparent distress. HEENT:      Head: Normocephalic and atraumatic.         Eyes: Conjunctivae are normal. Sclera is non-icteric. EOMI. PERRL      Mouth/Throat: Mucous membranes are moist.       Neck: Supple with no signs of meningismus. Cardiovascular: Regular rate and rhythm. No murmurs, gallops, or rubs. 2+ symmetrical distal pulses are present in all extremities. No JVD. Respiratory: Normal respiratory effort. Lungs are clear to auscultation with  faint crackles on the R base. Gastrointestinal: Soft, non tender, and non distended with positive bowel sounds. No rebound or guarding. Musculoskeletal: Nontender with normal range of motion in all extremities. No edema, cyanosis, or erythema of extremities. Neurologic: Normal speech and language. Face is symmetric. Moving all extremities. No gross focal neurologic deficits are appreciated. Skin: Skin is warm, dry and intact. No rash noted. Psychiatric: Mood and affect are normal. Speech and behavior are normal.  ____________________________________________   LABS (all labs ordered are listed, but only abnormal results are displayed)  Labs Reviewed  CBC WITH DIFFERENTIAL/PLATELET - Abnormal; Notable for the following:       Result Value   Hemoglobin 10.6 (*)    HCT 32.4 (*)    RDW 18.6 (*)    Platelets 579 (*)    Monocytes Absolute 1.4 (*)    All other components within normal limits  COMPREHENSIVE METABOLIC PANEL - Abnormal; Notable for the following:    Chloride 100 (*)    Calcium 8.2 (*)    Total Protein 5.3 (*)    Albumin 2.5 (*)    AST 13 (*)    Alkaline Phosphatase 35 (*)    All other components within normal limits  URINALYSIS, COMPLETE (UACMP) WITH MICROSCOPIC    ____________________________________________  EKG  ED ECG REPORT I, Rudene Re, the attending physician, personally viewed and interpreted this ECG.  Normal sinus rhythm, rate of 98, normal intervals, normal axis, no ST elevations or depressions.  ____________________________________________  RADIOLOGY  XR:  Persistent increased interstitial markings in the right lung with moderate-sized right pleural effusion. Slight interval worsening in the appearance of the left lung interstitial markings. The findings may reflect asymmetric pulmonary edema or worsening pneumonia. Lymphangitic spread of malignancy could present in a similar fashion. ____________________________________________   PROCEDURES  Procedure(s) performed: None Procedures Critical Care performed:  None ____________________________________________   INITIAL IMPRESSION / ASSESSMENT AND PLAN / ED COURSE   61 y.o. female with a history of metastatic lung cancer on chemotherapy, hypertension, hyperlipidemia, and anemia who presents for evaluation of generalized weakness. Last chemotherapy was 1 week ago. Patient is well-appearing, laughing and in good spirits in the room, her vitals show heart rate of 100 with a BP of 97/63, she is afebrile, she has faint crackles on the right base but otherwise her physical exam is with no acute findings. We'll give her IV fluids, will check CBC, CMP, UA, chest x-ray for any evidence of dehydration, electrolyte abnormalities, or infection.  Clinical Course as of Jan 02 1218  Tue Jan 02, 2017  1216 CXR concerning for possible R sided PNA which fits clinical picture of generalized fatigue, cough, and crackles on exam. Since this is a cancer patient on chemotherapy she will be treated for HCAP with IV cefepime and vancomycin. Will admit to Hospitalist.  [CV]    Clinical Course User Index [CV] Rudene Re, MD    Pertinent labs & imaging results that were available during  my care of the patient were reviewed by me and considered in my medical decision making (see chart for details).    ____________________________________________   FINAL CLINICAL IMPRESSION(S) / ED DIAGNOSES  Final diagnoses:  HCAP (healthcare-associated pneumonia)      NEW MEDICATIONS STARTED DURING THIS VISIT:  New Prescriptions   No medications on file     Note:  This document was prepared using Dragon voice recognition software and may include unintentional dictation errors.    Rudene Re, MD 01/02/17 [phone]

## 2017-01-02 NOTE — Progress Notes (Signed)
ANTIBIOTIC CONSULT NOTE - INITIAL  Pharmacy Consult for Levofloxacin Indication: pneumonia   Allergies  Allergen Reactions  . Tegaderm Ag Mesh [Silver] Other (See Comments)    blisters  . Aspirin Other (See Comments)    GIB    Patient Measurements: Height: '5\' 5"'$  (165.1 cm) Weight: 170 lb (77.1 kg) IBW/kg (Calculated) : 57 Adjusted Body Weight:   Vital Signs: Temp: 98.1 F (36.7 C) (04/17 1033) Temp Source: Oral (04/17 1033) BP: 100/77 (04/17 1300) Pulse Rate: 94 (04/17 1100) Intake/Output from previous day: No intake/output data recorded. Intake/Output from this shift: No intake/output data recorded.  Labs:  Recent Labs  01/02/17 1040  WBC 7.1  HGB 10.6*  PLT 579*  CREATININE 0.63   Estimated Creatinine Clearance: 77.7 mL/min (by C-G formula based on SCr of 0.63 mg/dL). No results for input(s): VANCOTROUGH, VANCOPEAK, VANCORANDOM, GENTTROUGH, GENTPEAK, GENTRANDOM, TOBRATROUGH, TOBRAPEAK, TOBRARND, AMIKACINPEAK, AMIKACINTROU, AMIKACIN in the last 72 hours.   Microbiology: Recent Results (from the past 720 hour(s))  Culture, blood (Routine X 2) w Reflex to ID Panel     Status: None   Collection Time: 12/24/16  3:50 PM  Result Value Ref Range Status   Specimen Description BLOOD  RIGHT AC  Final   Special Requests BOTTLES DRAWN AEROBIC AND ANAEROBIC  [place]  Final   Culture NO GROWTH 5 DAYS  Final   Report Status 12/29/2016 FINAL  Final  Culture, blood (Routine X 2) w Reflex to ID Panel     Status: None   Collection Time: 12/24/16  4:19 PM  Result Value Ref Range Status   Specimen Description BLOOD LEFT ASSIST CONTROL  Final   Special Requests   Final    BOTTLES DRAWN AEROBIC AND ANAEROBIC Blood Culture adequate volume   Culture NO GROWTH 5 DAYS  Final   Report Status 12/29/2016 FINAL  Final    Medical History: Past Medical History:  Diagnosis Date  . Anemia 01/21/2015  . 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 Anxiety   . GERD (gastroesophageal reflux  disease)   . Hypercholesteremia   . Hypertension   . Last menstrual period (LMP) > 10 days ago 2007  . Lung cancer ([place])   . Metastatic lung cancer (metastasis from lung to other site) ([place]) 01/21/2015    Medications:   (Not in a hospital admission) Scheduled:  . clindamycin  300 mg Oral Q8H  . docusate sodium  100 mg Oral BID  . enoxaparin (LOVENOX) injection  40 mg Subcutaneous Q24H  . sodium chloride flush  3 mL Intravenous Q12H   Assessment: Pharmacy consulted to dose and monitor levofloxacin in this 61 year old woman with pneumonia.   Goal of Therapy:    Plan:  Will start Levofloxacin 750 mg IV q24 hours.    Jaena Brocato D 01/02/2017,2:13 PM

## 2017-01-02 NOTE — ED Triage Notes (Signed)
Per [facility], patient comes from home. Stage 4 lung cancer, patient was suppose to get chemo today here but cancelled due to generalized weakness. Patient was unable to get out of bed this morning. Patient was seen here recently and dx with a breast infection and is current on clindamycin.

## 2017-01-03 ENCOUNTER — Inpatient Hospital Stay: Payer: BLUE CROSS/BLUE SHIELD

## 2017-01-03 DIAGNOSIS — Z79890 Hormone replacement therapy (postmenopausal): Secondary | ICD-10-CM

## 2017-01-03 DIAGNOSIS — R531 Weakness: Secondary | ICD-10-CM

## 2017-01-03 DIAGNOSIS — R5383 Other fatigue: Secondary | ICD-10-CM

## 2017-01-03 DIAGNOSIS — C7802 Secondary malignant neoplasm of left lung: Secondary | ICD-10-CM

## 2017-01-03 DIAGNOSIS — D649 Anemia, unspecified: Secondary | ICD-10-CM

## 2017-01-03 DIAGNOSIS — J910 Malignant pleural effusion: Secondary | ICD-10-CM

## 2017-01-03 DIAGNOSIS — Z79899 Other long term (current) drug therapy: Secondary | ICD-10-CM

## 2017-01-03 DIAGNOSIS — K219 Gastro-esophageal reflux disease without esophagitis: Secondary | ICD-10-CM

## 2017-01-03 DIAGNOSIS — Z8049 Family history of malignant neoplasm of other genital organs: Secondary | ICD-10-CM

## 2017-01-03 DIAGNOSIS — R0602 Shortness of breath: Secondary | ICD-10-CM

## 2017-01-03 DIAGNOSIS — Z9221 Personal history of antineoplastic chemotherapy: Secondary | ICD-10-CM

## 2017-01-03 DIAGNOSIS — C3491 Malignant neoplasm of unspecified part of right bronchus or lung: Secondary | ICD-10-CM

## 2017-01-03 DIAGNOSIS — Z87891 Personal history of nicotine dependence: Secondary | ICD-10-CM

## 2017-01-03 DIAGNOSIS — I10 Essential (primary) hypertension: Secondary | ICD-10-CM

## 2017-01-03 DIAGNOSIS — F419 Anxiety disorder, unspecified: Secondary | ICD-10-CM

## 2017-01-03 DIAGNOSIS — R5381 Other malaise: Secondary | ICD-10-CM

## 2017-01-03 DIAGNOSIS — E7800 Pure hypercholesterolemia, unspecified: Secondary | ICD-10-CM

## 2017-01-03 DIAGNOSIS — C7951 Secondary malignant neoplasm of bone: Secondary | ICD-10-CM

## 2017-01-03 LAB — BASIC METABOLIC PANEL
ANION GAP: 4 — AB (ref 5–15)
BUN: 16 mg/dL (ref 6–20)
CALCIUM: 7.8 mg/dL — AB (ref 8.9–10.3)
CHLORIDE: 103 mmol/L (ref 101–111)
CO2: 29 mmol/L (ref 22–32)
Creatinine, Ser: 0.54 mg/dL (ref 0.44–1.00)
GFR calc non Af Amer: >60 mL/min (ref >60)
GLUCOSE: 97 mg/dL (ref 65–99)
Potassium: 3.6 mmol/L (ref 3.5–5.1)
Sodium: 136 mmol/L (ref 135–145)

## 2017-01-03 LAB — CBC
HEMATOCRIT: 30.9 % — AB (ref 35.0–47.0)
Hemoglobin: 9.8 g/dL — ABNORMAL LOW (ref 12.0–16.0)
MCH: 26.2 pg (ref 26.0–34.0)
MCHC: 31.6 g/dL — AB (ref 32.0–36.0)
MCV: 82.9 fL (ref 80.0–100.0)
Platelets: 581 10*3/uL — ABNORMAL HIGH (ref 150–440)
RBC: 3.73 MIL/uL — ABNORMAL LOW (ref 3.80–5.20)
RDW: 18.2 % — AB (ref 11.5–14.5)
WBC: 6.3 10*3/uL (ref 3.6–11.0)

## 2017-01-03 LAB — BODY FLUID CELL COUNT WITH DIFFERENTIAL
Eos, Fluid: 0 %
LYMPHS FL: 77 %
MONOCYTE-MACROPHAGE-SEROUS FLUID: 13 %
NEUTROPHIL FLUID: 10 %
Other Cells, Fluid: 0 %
Total Nucleated Cell Count, Fluid: 268 cu mm

## 2017-01-03 MED ORDER — ALUM & MAG HYDROXIDE-SIMETH 200-200-20 MG/5ML PO SUSP
15.0000 mL | Freq: Four times a day (QID) | ORAL | Status: DC | PRN
  Administered 2017-01-03 – 2017-01-04 (×3): 15 mL via ORAL
  Filled 2017-01-03 (×3): qty 30

## 2017-01-03 MED ORDER — LEVOFLOXACIN 750 MG PO TABS
750.0000 mg | ORAL_TABLET | Freq: Every day | ORAL | Status: DC
  Administered 2017-01-03 – 2017-01-04 (×2): 750 mg via ORAL
  Filled 2017-01-03 (×2): qty 1

## 2017-01-03 NOTE — Procedures (Signed)
Pre procedural Dx: History of metastatic lung cancer, now with recurrent symptomatic Pleural effusion Post procedural Dx: Same  Successful US guided left sided thoracentesis yielding 1 L of serous pleural fluid.   Samples sent to lab for analysis.  EBL: None  Complications: None immediate.  Ronny Bacon, MD Pager #: [phone]

## 2017-01-03 NOTE — Progress Notes (Signed)
[facility] NAME: Patricia Kelley    MR#:  [id number]  DATE OF BIRTH:  16-Feb-1956  SUBJECTIVE:   Came in with SOB. No cough or fever. REVIEW OF SYSTEMS:   Review of Systems  Constitutional: Negative for chills, fever and weight loss.  HENT: Negative for ear discharge, ear pain and nosebleeds.   Eyes: Negative for blurred vision, pain and discharge.  Respiratory: Negative for sputum production, shortness of breath, wheezing and stridor.   Cardiovascular: Negative for chest pain, palpitations, orthopnea and PND.  Gastrointestinal: Negative for abdominal pain, diarrhea, nausea and vomiting.  Genitourinary: Negative for frequency and urgency.  Musculoskeletal: Negative for back pain and joint pain.  Neurological: Negative for sensory change, speech change, focal weakness and weakness.  Psychiatric/Behavioral: Negative for depression and hallucinations. The patient is not nervous/anxious.    Tolerating Diet:yes Tolerating PT: pending  DRUG ALLERGIES:   Allergies  Allergen Reactions  . Tegaderm Ag Mesh [Silver] Other (See Comments)    blisters  . Aspirin Other (See Comments)    GIB    VITALS:  Blood pressure 103/75, pulse (!) 104, temperature 99 F (37.2 C), temperature source Oral, resp. rate 18, height '5\' 5"'$  (1.651 m), weight 76 kg (167 lb 9.6 oz), SpO2 100 %.  PHYSICAL EXAMINATION:   Physical Exam  GENERAL:  61 y.o.-year-old patient lying in the bed with no acute distress.  EYES: Pupils equal, round, reactive to light and accommodation. No scleral icterus. Extraocular muscles intact.  HEENT: Head atraumatic, normocephalic. Oropharynx and nasopharynx clear.  NECK:  Supple, no jugular venous distention. No thyroid enlargement, no tenderness.  LUNGS: distant  breath sounds bilaterally, no wheezing, rales, rhonchi. No use of accessory muscles of respiration.  CARDIOVASCULAR: S1, S2 normal. No murmurs, rubs, or  gallops.  ABDOMEN: Soft, nontender, nondistended. Bowel sounds present. No organomegaly or mass.  EXTREMITIES: No cyanosis, clubbing or edema b/l.    NEUROLOGIC: Cranial nerves II through XII are intact. No focal Motor or sensory deficits b/l.   PSYCHIATRIC:  patient is alert and oriented x 3.  SKIN: No obvious rash, lesion, or ulcer.   LABORATORY PANEL:  CBC  Recent Labs Lab 01/03/17 0528  WBC 6.3  HGB 9.8*  HCT 30.9*  PLT 581*    Chemistries   Recent Labs Lab 01/02/17 1040 01/03/17 0528  NA 137 136  K 3.8 3.6  CL 100* 103  CO2 30 29  GLUCOSE 88 97  BUN 20 16  CREATININE 0.63 0.54  CALCIUM 8.2* 7.8*  AST 13*  --   ALT 21  --   ALKPHOS 35*  --   BILITOT 0.6  --    Cardiac Enzymes No results for input(s): TROPONINI in the last 168 hours. RADIOLOGY:  Dg Chest Portable 1 View  Result Date: 01/02/2017 CLINICAL DATA:  Stage IV lung malignancy. Unable to receive chemotherapy today due to generalized weakness. Currently cleaning being treated with clindamycin for breast infection. Known metastatic lung malignancy. EXAM: PORTABLE CHEST 1 VIEW COMPARISON:  PA and lateral chest x-ray of December 24, 2016 FINDINGS: There is persistent volume loss on the right. The interstitial markings remain increased. A moderate sized right pleural effusion laterally and inferiorly persists. On the left the interstitial markings are slightly more conspicuous overall. The cardiac silhouette is not enlarged. There is calcification in the wall of the aortic arch. The central pulmonary vascularity is prominent. The bony thorax exhibits no acute abnormality. IMPRESSION: Persistent  increased interstitial markings in the right lung with moderate-sized right pleural effusion. Slight interval worsening in the appearance of the left lung interstitial markings. The findings may reflect asymmetric pulmonary edema or worsening pneumonia. Lymphangitic spread of malignancy could present in a similar fashion.  Electronically Signed   By: David  Martinique M.D.   On: 01/02/2017 12:02   ASSESSMENT AND PLAN:  Patricia Kelley  is a 61 y.o. 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 She does have chronic shortness of breath and cough on 2 L oxygen which is unchanged. Here in the emergency room patient has been found to have bilateral infiltrates on the chest x-ray also concerning for possible lymphangitic spread.  * Bilateral infiltrates concerning for pneumonia. This could also be lymphangitic spread. We'll start patient on IV Levaquin. She is already on clindamycin for recent breast cellulitis which will be continued. Afebrile US thoracentesis for recurrent pleural effusion. Pt has had it 2 times before  * Chronic respiratory failure is stable.  * Hypertension. Hold blood pressure medications as patient continues to have low normal blood pressure.  * Stage IV lung cancer. Concern for lymphangitic spread on chest x-ray. We will consult oncology.  * Anemia of chronic disease is stable  * DVT prophylaxis - Lovenox  PT to see Case discussed with Care Management/Social Worker. Management plans discussed with the patient, family and they are in agreement.  CODE STATUS: Full  DVT Prophylaxis: lovenox  TOTAL TIME TAKING CARE OF THIS PATIENT: 30 minutes.  >50% time spent on counselling and coordination of care  POSSIBLE D/C IN  1-2DAYS, DEPENDING ON CLINICAL CONDITION.  Note: This dictation was prepared with Dragon dictation along with smaller phrase technology. Any transcriptional errors that result from this process are unintentional.  Louann Hopson M.D on 01/03/2017 at 3:34 PM  Between 7am to 6pm - Pager - [phone]  After 6pm go to [email or web address] - password [email or web address] [facility]  Office  [phone]  CC: Primary care  physician; Marguerita Merles, MD

## 2017-01-03 NOTE — Evaluation (Signed)
Physical Therapy Evaluation Patient Details Name: Patricia Kelley MRN: [id number] DOB: 07/13/1956 Today's Date: 01/03/2017   History of Present Illness  Pt is a 61 y.o. 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   Clinical Impression  Pt sitting in the chair talking to family upon entry. Pt very pleasant and agreeable to PT. Pt presents with generalized weakness and decreased activity tolerance. Pt required CGA for transfers and ambulation d/t fatigue with activity and required multiple rest beaks. Pt fatigued, but resting comfortably in the chair at end on session. Pt will benefit from skilled PT during admission to increase strength, activity tolerance, and ambulation distance. Recommend HHPT at d/c to address impairments listed above.    Follow Up Recommendations Home health PT;Supervision/Assistance - 24 hour    Equipment Recommendations  None recommended by PT    Recommendations for Other Services       Precautions / Restrictions Precautions Precautions: Fall Restrictions Weight Bearing Restrictions: No      Mobility  Bed Mobility               General bed mobility comments: Not assessed d/t pt beginning and ending eval in chair; pt reports needing assistance form nursing and that is was very tiring when she got out of the bed this morning  Transfers Overall transfer level: Needs assistance Equipment used: Rolling walker (2 wheeled) Transfers: Sit to/from Stand Sit to Stand: Min guard         General transfer comment: Pt requires increased time for transfers, but is steady and controlled with her movements  Ambulation/Gait Ambulation/Gait assistance: Min guard (for safety) Ambulation Distance (Feet): 12 Feet (x2) Assistive device: Rolling walker (2  wheeled) Gait Pattern/deviations: Step-through pattern;Decreased stride length   Gait velocity interpretation: Below normal speed for age/gender General Gait Details: Slow cadence with gait but steady without LOB; pt fatigued after 12 ft and HR 125bpm, pt sat to rest for a few minutes and HR went down to 111bpm and pt able to walk another 12 ft  Stairs            Wheelchair Mobility    Modified Rankin (Stroke Patients Only)       Balance Overall balance assessment: Needs assistance;History of Falls Sitting-balance support: No upper extremity supported;Feet supported Sitting balance-Leahy Scale: Fair Sitting balance - Comments: pt fatigues with sitting on the edge of the chair for strength assessment   Standing balance support: Bilateral upper extremity supported Standing balance-Leahy Scale: Fair Standing balance comment: Pt requires CGA for safety with RW d/t weakness and fatigue                             Pertinent Vitals/Pain Pain Assessment: No/denies pain    Home Living Family/patient expects to be discharged to:: Private residence Living Arrangements: Spouse/significant other Available Help at Discharge: Family;Available 24 hours/day (Pt lives with her husband) Type of Home: House Home Access: Stairs to enter Entrance Stairs-Rails: Right;Left;Can reach both Entrance Stairs-Number of Steps: 2 Home Layout: One level Home Equipment: Walker - 4 wheels;Tub bench;Grab bars - tub/shower;Bedside commode      Prior Function Level of Independence: Needs assistance   Gait / Transfers Assistance Needed: Pt's husband reports that the pt has had 2-3  falls in the past year and can no longer ambulate to the dinning room and only goes from the bed to the bathroom when she feels up to it, otherwise she uses the bed-side commode next to the bed  ADL's / Homemaking Assistance Needed: Pt needs some assistance with ADLs, pt reports she has two friends that come help her  bath and dress, but she does what she can; pt's husband assists with IADLS and ADLs as needed        Hand Dominance   Dominant Hand: Right    Extremity/Trunk Assessment   Upper Extremity Assessment Upper Extremity Assessment: Generalized weakness (pt's strength is 4-/5 for shoulder flexion and elbow flexion; 4/5 for elbow extension; fair grip strength; pt reports some sensation loss in the tips of her fingers, but all other UE sensation intact)    Lower Extremity Assessment Lower Extremity Assessment: Generalized weakness (Pt's strength is 4-/5 for hip flexion; 4/5 for knee flexion/extension; 5/5 for DF/PF in supine; pt reports some sensation loss in the tips of her toes, but all other LE sensation intact)    Cervical / Trunk Assessment Cervical / Trunk Assessment: Normal  Communication   Communication: No difficulties  Cognition Arousal/Alertness: Awake/alert Behavior During Therapy: WFL for tasks assessed/performed Overall Cognitive Status: Within Functional Limits for tasks assessed                                        General Comments General comments (skin integrity, edema, etc.): Pt HR between 97-125 bpm and O2 >96% on 3L O2    Exercises     Assessment/Plan    PT Assessment Patient needs continued PT services  PT Problem List Decreased strength;Decreased mobility;Decreased activity tolerance;Decreased balance       PT Treatment Interventions Gait training;Therapeutic activities;Therapeutic exercise;Stair training;Balance training;Functional mobility training;Patient/family education;DME instruction    PT Goals (Current goals can be found in the Care Plan section)  Acute Rehab PT Goals Patient Stated Goal: To get better and stronger and to go back home PT Goal Formulation: With patient Time For Goal Achievement: 01/17/17 Potential to Achieve Goals: Good    Frequency Min 2X/week   Barriers to discharge        Co-evaluation                End of Session Equipment Utilized During Treatment: Gait belt;Oxygen (3L O2) Activity Tolerance: Patient limited by fatigue;No increased pain (Pt reported experiencing back pain with nursing when she got out of bed, but denies back pain during eval) Patient left: in chair;with call bell/phone within reach;with chair alarm set;with family/visitor present Nurse Communication: Mobility status PT Visit Diagnosis: Muscle weakness (generalized) (M62.81);Difficulty in walking, not elsewhere classified (R26.2)    Time: 9163-8466 PT Time Calculation (min) (ACUTE ONLY): 32 min   Charges:   PT Evaluation $PT Eval Low Complexity: 1 Procedure PT Treatments $Therapeutic Exercise: 8-22 mins   PT G Codes:       Laketa Sandoz, SPT 01/03/2017, 12:55 PM

## 2017-01-03 NOTE — Care Management (Signed)
Admitted to this facility with the diagnosis of pneumonia. Discharged from this facility 12/26/16. Lives with husband, Gwyndolyn Saxon [phone]). Last seen Dr. Delight Stare about 4 weeks ago. Home oxygen per [facility] 2 liters per nasal cannula continuous. Followed by [facility] in the home for home health and physical therapy now. Rolling Walker in the home. Falls in the past. Takes care of all basic activities of daily living herself. Possible home tomorrow per Dr. Fritzi Mandes Shelbie Ammons RN MSN CCM Care Management [phone]

## 2017-01-03 NOTE — Progress Notes (Signed)
PHARMACIST - PHYSICIAN COMMUNICATION DR:   Darvin Neighbours CONCERNING: Antibiotic IV to Oral Route Change Policy  RECOMMENDATION: This patient is receiving Levofloxacin by the intravenous route.  Based on criteria approved by the Pharmacy and Therapeutics Committee, the antibiotic(s) is/are being converted to the equivalent oral dose form(s).   DESCRIPTION: These criteria include:  Patient being treated for a respiratory tract infection, urinary tract infection, cellulitis or clostridium difficile associated diarrhea if on metronidazole  The patient is not neutropenic and does not exhibit a GI malabsorption state  The patient is eating (either orally or via tube) and/or has been taking other orally administered medications for a least 24 hours  The patient is improving clinically and has a Tmax < 100.5  If you have questions about this conversion, please contact the Pharmacy Department  '[]'$   [phone] )  [facility] '[]'$   [phone] )  [facility] '[]'$   [phone] )  [facility] '[]'$   [phone] )  [facility] '[]'$   [phone] )  [facility]

## 2017-01-03 NOTE — Consult Note (Signed)
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   INTERVAL HISTORY: Patient is a 61 year old female who last received chemotherapy for the above stated lung cancer on December 19, 2016. She was recently discharged from the hospital with a right breast cellulitis and was in her usual state of health until the day of admission when she developed profound weakness and fatigue and worsening shortness of breath. Chest x-ray on admission was found to have a worsening pleural effusion. Patient subsequently had a thoracentesis removing 1 L of fluid with significant improvement of her symptoms. She continues to have weakness and fatigue, but feels significantly improved. She has no neurologic complaints. She denies any chest pain. She denies any fevers. She has a fair appetite, but denies weight loss. She has no nausea, vomiting, constipation, or diarrhea. She has no urinary complaints. Patient otherwise feels well and offers no further specific complaints.  REVIEW OF SYSTEMS:   Review of Systems  Constitutional: Positive for malaise/fatigue. Negative for fever and weight loss.  Respiratory: Positive for cough and shortness of breath. Negative for hemoptysis.   Cardiovascular: Negative.  Negative for leg swelling.  Gastrointestinal: Negative.  Negative for abdominal pain, nausea and vomiting.  Genitourinary: Negative.   Musculoskeletal: Negative.   Skin: Negative.   Neurological: Positive for weakness. Negative for sensory change.  Psychiatric/Behavioral: Negative.  The patient is not nervous/anxious.     As per HPI. Otherwise, a complete review of systems  is negative.  PAST MEDICAL HISTORY: Past Medical History:  Diagnosis Date  . Anemia 01/21/2015  . 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 Anxiety   . GERD (gastroesophageal reflux disease)   . Hypercholesteremia   . Hypertension   . Last menstrual period (LMP) > 10 days ago 2007  . Lung cancer ([place])   . Metastatic lung cancer (metastasis from lung to other site) [facility] 01/21/2015    PAST SURGICAL HISTORY: Past Surgical History:  Procedure Laterality Date  . ABDOMINAL HYSTERECTOMY     partial  . INSERTION / PLACEMENT PLEURAL CATHETER    . PORTACATH PLACEMENT Right 04/26/2015   Procedure: INSERTION PORT-A-CATH;  Surgeon: Nestor Lewandowsky, MD;  Location: [facility] ORS;  Service: General;  Laterality: Right;    FAMILY HISTORY: Family History  Problem Relation Age of Onset  . Cervical cancer Mother   . CVA Mother   . Uterine cancer Maternal Grandmother   . CVA Father     ADVANCED DIRECTIVES (Y/N):  '@ADVDIR'$ @  HEALTH MAINTENANCE: Social History  Substance Use Topics  . Smoking status: Former Smoker    Packs/day: 0.50    Years: 40.00    Types: Cigarettes    Quit date: 07/20/2014  . Smokeless tobacco: Never Used  . Alcohol use Yes     Comment: socially     Colonoscopy:  PAP:  Bone density:  Lipid panel:  Allergies  Allergen Reactions  . Tegaderm Ag Mesh [Silver] Other (See Comments)    blisters  . Aspirin Other (See Comments)    GIB    Current Facility-Administered Medications  Medication Dose Route Frequency Provider Last Rate Last Dose  . 0.9 %  sodium chloride infusion  250 mL Intravenous PRN Hillary Bow, MD      .  acetaminophen (TYLENOL) tablet 650 mg  650 mg Oral Q6H PRN Hillary Bow, MD   650 mg at 01/03/17 2227   Or  . acetaminophen (TYLENOL) suppository 650 mg  650 mg Rectal Q6H PRN Srikar Sudini, MD      . albuterol (PROVENTIL) (2.5 MG/3ML) 0.083% nebulizer solution 2.5 mg  2.5 mg Nebulization Q2H PRN Hillary Bow, MD   2.5 mg at 01/03/17 0151  . ALPRAZolam  Duanne Moron) tablet 0.5 mg  0.5 mg Oral TID PRN Hillary Bow, MD      . alum & mag hydroxide-simeth (MAALOX/MYLANTA) 200-200-20 MG/5ML suspension 15 mL  15 mL Oral Q6H PRN Fritzi Mandes, MD   15 mL at 01/03/17 1725  . clindamycin (CLEOCIN) capsule 300 mg  300 mg Oral Q8H Srikar Sudini, MD   300 mg at 01/03/17 1626  . diphenhydrAMINE (BENADRYL) capsule 25 mg  25 mg Oral QHS PRN Srikar Sudini, MD      . docusate sodium (COLACE) capsule 100 mg  100 mg Oral BID Hillary Bow, MD   100 mg at 01/03/17 2204  . enoxaparin (LOVENOX) injection 40 mg  40 mg Subcutaneous Q24H Hillary Bow, MD   40 mg at 01/03/17 2204  . furosemide (LASIX) tablet 20 mg  20 mg Oral Daily Hillary Bow, MD   20 mg at 01/03/17 0847  . gabapentin (NEURONTIN) capsule 300 mg  300 mg Oral TID Hillary Bow, MD   300 mg at 01/03/17 2204  . levofloxacin (LEVAQUIN) tablet 750 mg  750 mg Oral Daily Hillary Bow, MD   750 mg at 01/03/17 1044  . loratadine (CLARITIN) tablet 10 mg  10 mg Oral Daily Hillary Bow, MD   10 mg at 01/03/17 0847  . ondansetron (ZOFRAN) tablet 4 mg  4 mg Oral Q6H PRN Hillary Bow, MD       Or  . ondansetron (ZOFRAN) injection 4 mg  4 mg Intravenous Q6H PRN Srikar Sudini, MD      . oxyCODONE (Oxy IR/ROXICODONE) immediate release tablet 5 mg  5 mg Oral TID PRN Hillary Bow, MD   5 mg at 01/03/17 1626  . pantoprazole (PROTONIX) EC tablet 40 mg  40 mg Oral Daily Hillary Bow, MD   40 mg at 01/03/17 0847  . polyethylene glycol (MIRALAX / GLYCOLAX) packet 17 g  17 g Oral Daily PRN Srikar Sudini, MD      . potassium chloride SA (K-DUR,KLOR-CON) CR tablet 20 mEq  20 mEq Oral Daily Hillary Bow, MD   20 mEq at 01/03/17 0847  . simvastatin (ZOCOR) tablet 20 mg  20 mg Oral QHS Hillary Bow, MD   20 mg at 01/03/17 2204  . sodium chloride flush (NS) 0.9 % injection 3 mL  3 mL Intravenous Q12H Srikar Sudini, MD   3 mL at 01/03/17 2225  . sodium chloride flush (NS) 0.9 % injection 3 mL  3 mL Intravenous PRN Srikar Sudini, MD       . traMADol Veatrice Bourbon) tablet 50 mg  50 mg Oral Q8H PRN Hillary Bow, MD       Facility-Administered Medications Ordered in Other Encounters  Medication Dose Route Frequency Provider Last Rate Last Dose  . heparin lock flush 100 unit/mL  250 Units Intracatheter PRN Evlyn Kanner, NP      . sodium chloride 0.9 % injection 10 mL  10 mL Intracatheter PRN Evlyn Kanner, NP      . sodium chloride 0.9 % injection 10 mL  10 mL Intravenous  PRN Leia Alf, MD   10 mL at 04/27/15 0905  . sodium chloride 0.9 % injection 3 mL  3 mL Intracatheter PRN Evlyn Kanner, NP        OBJECTIVE: Vitals:   01/03/17 1617 01/03/17 2018  BP: 91/60 101/60  Pulse: 94 96  Resp: 18 19  Temp: 98.7 F (37.1 C) 98.7 F (37.1 C)     Body mass index is 27.89 kg/m.    ECOG FS:1 - Symptomatic but completely ambulatory  General: Well-developed, well-nourished, no acute distress. Eyes: Pink conjunctiva, anicteric sclera. HEENT: Normocephalic, moist mucous membranes, clear oropharnyx. Lungs: Diminished breath sounds bilaterally. Heart: Regular rate and rhythm. No rubs, murmurs, or gallops. Abdomen: Soft, nontender, nondistended. No organomegaly noted, normoactive bowel sounds. Musculoskeletal: No edema, cyanosis, or clubbing. Neuro: Alert, answering all questions appropriately. Cranial nerves grossly intact. Skin: No rashes or petechiae noted. Psych: Normal affect.   LAB RESULTS:  Lab Results  Component Value Date   NA 136 01/03/2017   K 3.6 01/03/2017   CL 103 01/03/2017   CO2 29 01/03/2017   GLUCOSE 97 01/03/2017   BUN 16 01/03/2017   CREATININE 0.54 01/03/2017   CALCIUM 7.8 (L) 01/03/2017   PROT 5.3 (L) 01/02/2017   ALBUMIN 2.5 (L) 01/02/2017   AST 13 (L) 01/02/2017   ALT 21 01/02/2017   ALKPHOS 35 (L) 01/02/2017   BILITOT 0.6 01/02/2017   GFRNONAA >60 01/03/2017   GFRAA >60 01/03/2017    Lab Results  Component Value Date   WBC 6.3 01/03/2017   NEUTROABS 3.9 01/02/2017   HGB 9.8  (L) 01/03/2017   HCT 30.9 (L) 01/03/2017   MCV 82.9 01/03/2017   PLT 581 (H) 01/03/2017     STUDIES: Dg Chest 1 View  Result Date: 01/03/2017 CLINICAL DATA:  History of metastatic lung cancer, now with recurrent symptomatic left-sided pleural effusion. Post left-sided thoracentesis EXAM: CHEST 1 VIEW COMPARISON:  Chest radiograph - 01/02/2017 FINDINGS: Grossly unchanged cardiac silhouette and mediastinal contours. Interval reduction/resolution of left-sided pleural effusion post thoracentesis. No pneumothorax. Pleuroparenchymal thickening about the periphery of the right lung is unchanged. Grossly unchanged nodular interstitial opacities throughout the bilateral pulmonary parenchyma, right greater than left. No discrete focal airspace opacities. No evidence of edema peer Stable position of support apparatus. No acute osseus abnormalities. IMPRESSION: 1. Interval reduction/resolution of left-sided effusion post thoracentesis. No pneumothorax. 2. Similar findings compatible with provided history of metastatic lung cancer with chronic right-sided pleuroparenchymal thickening and findings worrisome for lymphangitic spread of tumor involving the bilateral lungs, right greater than left, grossly unchanged. 3. No new focal airspace opacities. Electronically Signed   By: Sandi Mariscal M.D.   On: 01/03/2017 15:34   Dg Chest 2 View  Result Date: 12/24/2016 CLINICAL DATA:  Stage four right-sided non-small cell lung cancer. EXAM: CHEST  2 VIEW COMPARISON:  11/28/2016 and chest CT 11/30/2016 FINDINGS: Right IJ Port-A-Cath unchanged. Patient slightly rotated to the right. Small to moderate right-sided pleural effusion tracking to the apex slightly worse. Persistent interstitial prominence over the mid to lower left lung and persistent hazy heterogeneous opacification of the right mid to lower lung. Cardiomediastinal silhouette and remainder of the exam is unchanged. IMPRESSION: Slight worsening moderate size right  pleural effusion tracking to the apex. Stable heterogeneous opacification over the right mid to lower lung and interstitial prominence over the left mid to lower lung. Electronically Signed   By: Marin Olp M.D.   On: 12/24/2016 15:54   Dg Chest Portable  1 View  Result Date: 01/02/2017 CLINICAL DATA:  Stage IV lung malignancy. Unable to receive chemotherapy today due to generalized weakness. Currently cleaning being treated with clindamycin for breast infection. Known metastatic lung malignancy. EXAM: PORTABLE CHEST 1 VIEW COMPARISON:  PA and lateral chest x-ray of December 24, 2016 FINDINGS: There is persistent volume loss on the right. The interstitial markings remain increased. A moderate sized right pleural effusion laterally and inferiorly persists. On the left the interstitial markings are slightly more conspicuous overall. The cardiac silhouette is not enlarged. There is calcification in the wall of the aortic arch. The central pulmonary vascularity is prominent. The bony thorax exhibits no acute abnormality. IMPRESSION: Persistent increased interstitial markings in the right lung with moderate-sized right pleural effusion. Slight interval worsening in the appearance of the left lung interstitial markings. The findings may reflect asymmetric pulmonary edema or worsening pneumonia. Lymphangitic spread of malignancy could present in a similar fashion. Electronically Signed   By: David  Martinique M.D.   On: 01/02/2017 12:02   US Breast Ltd Uni Right Inc Axilla  Result Date: 12/29/2016 CLINICAL DATA:  Right chest and breast pain for 3 days. Right breast is red. Concern for cellulitis or possible abscess. Patient had chemotherapy treatment on Tuesday for lung cancer. Submitted for interpretation on 12/29/2016. EXAM: ULTRASOUND OF THE RIGHT BREAST COMPARISON:  CT of the chest 11/30/2016 FINDINGS: Targeted ultrasound is performed, showing significant parenchymal edema in the images provided. There is skin  thickening in the images provided, demonstrating for quadrant edema. No fluid collection identified. IMPRESSION: Significant parenchymal and skin edema. Considerations include mastitis or malignancy. Further evaluation is indicated as malignancy has not been excluded. No imaged drainable fluid collection. RECOMMENDATION: Diagnostic mammography and right breast ultrasound are recommended for further evaluation. I have discussed the findings and recommendations with the patient. Results were also provided in writing at the conclusion of the visit. If applicable, a reminder letter will be sent to the patient regarding the next appointment. BI-RADS CATEGORY  0: Incomplete. Need additional imaging evaluation and/or prior mammograms for comparison. Electronically Signed   By: Nolon Nations M.D.   On: 12/29/2016 14:00   US Thoracentesis Asp Pleural Space W/img Guide  Result Date: 01/03/2017 INDICATION: History of metastatic lung cancer, now with recurrent symptomatic left-sided pleural effusion. Please from ultrasound-guided thoracentesis for diagnostic and therapeutic purposes. EXAM: US THORACENTESIS ASP PLEURAL SPACE W/IMG GUIDE COMPARISON:  Chest CT - 11/30/2016; attempted ultrasound-guided right-sided thoracentesis - 10/26/2016 MEDICATIONS: None. COMPLICATIONS: None immediate. TECHNIQUE: Informed written consent was obtained from the patient after a discussion of the risks, benefits and alternatives to treatment. A timeout was performed prior to the initiation of the procedure. Initial ultrasound scanning demonstrates a moderate sized left-sided pleural effusion. The lower chest was prepped and draped in the usual sterile fashion. 1% lidocaine was used for local anesthesia. An ultrasound image was saved for documentation purposes. An 8 Fr Safe-T-Centesis catheter was introduced. The thoracentesis was performed. The catheter was removed and a dressing was applied. The patient tolerated the procedure well without  immediate post procedural complication. The patient was escorted to have an upright chest radiograph. FINDINGS: A total of approximately 1 liter of serous fluid was removed. Requested samples were sent to the laboratory. IMPRESSION: Successful ultrasound-guided left sided thoracentesis yielding 1 liter of pleural fluid. Electronically Signed   By: Sandi Mariscal M.D.   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  PLAN:    1. 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 CT scan results from December 01, 2016 reviewed independently with progression of disease. Hospice and end-of-life or previously discussed with patient, but she declined at this time and wished to pursue additional treatment. Patient last received dose reduced gemcitabine and vinorelbine on December 19, 2016. She was initially scheduled for her next cycle of chemotherapy yesterday, but this will be delayed one week. Upon discharge, please insure patient has her new follow-up appointment. 2. Shortness of breath: Improved with paracentesis above. Patient also has possible underlying pneumonia and is currently on antibiotics. No further intervention is needed. Follow-up as above.  3. Leukopenia: Resolved. 4. Anemia: Mild, secondary to chemotherapy. Monitor. 5. Weakness and fatigue: Continue home health as ordered.  Appreciate consult, call with questions.   Lloyd Huger, MD   01/03/2017 10:40 PM

## 2017-01-04 LAB — PATHOLOGIST SMEAR REVIEW

## 2017-01-04 MED ORDER — ALUM & MAG HYDROXIDE-SIMETH 200-200-20 MG/5ML PO SUSP: 15.0000 mL | Freq: Four times a day (QID) | ORAL | 0 refills | Status: AC | PRN

## 2017-01-04 MED ORDER — LEVOFLOXACIN 750 MG PO TABS: 750.0000 mg | ORAL_TABLET | Freq: Every day | ORAL | 0 refills | Status: DC

## 2017-01-04 NOTE — Progress Notes (Deleted)
[facility] [place] NAME: Patricia Kelley    MR#:  [id number]  DATE OF BIRTH:  [birth year]  DATE OF ADMISSION:  01/02/2017 ADMITTING PHYSICIAN: Hillary Bow, MD  DATE OF DISCHARGE: 01/04/2017  PRIMARY CARE PHYSICIAN: Marguerita Merles, MD    ADMISSION DIAGNOSIS:  HCAP (healthcare-associated pneumonia) [J18.9]  DISCHARGE DIAGNOSIS:   Acute on chronic respiratory failure secondary to right-sided pleural effusion status post thoracentesis was removed on 01/03/2017 Lymphangitic spread of tumor Chronic respiratory failure on oxygen Anemia of chronic disease SECONDARY DIAGNOSIS:   Past Medical History:  Diagnosis Date  . Anemia 01/21/2015  . 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 Anxiety   . GERD (gastroesophageal reflux disease)   . Hypercholesteremia   . Hypertension   . Last menstrual period (LMP) > 10 days ago 2007  . Lung cancer ([place])   . 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 She does have chronic shortness of breath and cough on 2 L oxygen which is unchanged. Here in the emergency room patient has been found to have bilateral infiltrates on the chest x-ray also concerning for possible lymphangitic spread.  * Acute on chronic respiratory failure secondary to Bilateral infiltrates concerning for Lymphagitic spread of tumor vs interstitial pneumonia -This favors more lymphangitic spread.  -cont levaquin short course Afebrile - pt  Is  s/p US thoracentesis for recurrent pleural effusion s/p 1 liter fluid removal Feels a lot batter ans sats 100 % on 2liter  * Chronic respiratory failure is stable.  * Hypertension -bp low normal . I will hold  lisinopril/hctz at d/c Cont lasix   * Stage IV lung cancer. Concern for lymphangitic spread on chest x-ray. We will consult oncology.  * Anemia of chronic disease is stable  * DVT prophylaxis - Lovenox  Overall better D/c home Discussed with patient and patient's sister-in-law  CONSULTS OBTAINED:  Treatment Team:  Lloyd Huger, MD  DRUG ALLERGIES:   Allergies  Allergen Reactions  . Tegaderm Ag Mesh [Silver] Other (See Comments)    blisters  . Aspirin Other (See Comments)    GIB    DISCHARGE MEDICATIONS:   Current Discharge Medication List    START taking these medications   Details  alum & mag hydroxide-simeth (MAALOX/MYLANTA) 200-200-20 MG/5ML suspension Take 15 mLs by mouth every 6 (six) hours as needed for indigestion or heartburn. Qty: 355 mL, Refills: 0    levofloxacin (LEVAQUIN) 750 MG tablet Take 1 tablet (750 mg total) by mouth daily. Qty: 2 tablet, Refills: 0      CONTINUE these medications which have NOT CHANGED   Details  ALPRAZolam (XANAX) 1 MG tablet TAKE 1/2 BY MOUTH 1 TO 2 TIMES DAILY AS NEEDED FOR ANXIETY AND TAKE 1 BY MOUTH AT NIGHT FOR ANXIETY/SLEEP Qty: 45 tablet, Refills: 0   Associated Diagnoses: Primary malignant neoplasm of right lung metastatic to other site (HCC)    diphenhydramine-acetaminophen (TYLENOL PM) 25-500 MG TABS tablet Take 1 tablet by mouth at bedtime as needed (sleep).    docusate sodium (COLACE) 100 MG capsule Take 100 mg by mouth 2 (two) times daily as needed for mild constipation.   Associated Diagnoses: Primary malignant neoplasm of right lung metastatic to other  site ([place])    furosemide (LASIX) 20 MG tablet Take 1 tablet (20 mg total) by mouth daily. Qty: 30 tablet, Refills: 2    gabapentin (NEURONTIN) 300 MG capsule Take 1 capsule (300 mg total) by mouth 3 (three) times daily. Qty: 270 capsule, Refills: 3   Associated Diagnoses: Metastatic lung cancer (metastasis from lung to other site), right (HCC)     lidocaine-prilocaine (EMLA) cream Apply cream 1 hour before chemotherapy treatment Qty: 30 g, Refills: 1    loratadine (CLARITIN) 10 MG tablet Take 10 mg by mouth daily.    Multiple Vitamins-Minerals (MULTIVITAMIN WITH MINERALS) tablet Take 1 tablet by mouth daily. Qty: 30 tablet, Refills: 3   Associated Diagnoses: Non-small cell carcinoma of lung, stage 4, right (HCC)    omeprazole (PRILOSEC) 20 MG capsule Take 1 capsule (20 mg total) by mouth daily. Qty: 30 capsule, Refills: 0    oxyCODONE (OXY IR/ROXICODONE) 5 MG immediate release tablet Take 1 tablet (5 mg total) by mouth 3 (three) times daily as needed for severe pain. Qty: 90 tablet, Refills: 0    potassium chloride SA (K-DUR,KLOR-CON) 20 MEQ tablet Take 1 tablet (20 mEq total) by mouth daily. Qty: 90 tablet, Refills: 1   Associated Diagnoses: Metastatic lung cancer (metastasis from lung to other site), right (HCC)    promethazine (PHENERGAN) 25 MG tablet Take 1 tablet (25 mg total) by mouth every 6 (six) hours as needed for nausea or vomiting. Qty: 30 tablet, Refills: 0    simvastatin (ZOCOR) 20 MG tablet Take 20 mg by mouth at bedtime.     traMADol (ULTRAM) 50 MG tablet Take 1 tablet (50 mg total) by mouth every 8 (eight) hours as needed for moderate pain. Qty: 60 tablet, Refills: 0    vitamin B-12 (CYANOCOBALAMIN) 1000 MCG tablet Take 1,000 mcg by mouth daily.      STOP taking these medications     clindamycin (CLEOCIN) 300 MG capsule      lisinopril-hydrochlorothiazide (PRINZIDE,ZESTORETIC) 10-12.5 MG per tablet         If you experience worsening of your admission symptoms, develop shortness of breath, life threatening emergency, suicidal or homicidal thoughts you must seek medical attention immediately by calling 911 or calling your MD immediately  if symptoms less severe.  You Must read complete instructions/literature along with all the possible adverse reactions/side effects for all the Medicines you take and  that have been prescribed to you. Take any new Medicines after you have completely understood and accept all the possible adverse reactions/side effects.   Please note  You were cared for by a hospitalist during your hospital stay. If you have any questions about your discharge medications or the care you received while you were in the hospital after you are discharged, you can call the unit and asked to speak with the hospitalist on call if the hospitalist that took care of you is not available. Once you are discharged, your primary care physician will handle any further medical issues. Please note that NO REFILLS for any discharge medications will be authorized once you are discharged, as it is imperative that you return to your primary care physician (or establish a relationship with a primary care physician if you do not have one) for your aftercare needs so that they can reassess your need for medications and monitor your lab values. Today   SUBJECTIVE   I am having heart burns  VITAL SIGNS:  Blood pressure 107/66, pulse 90, temperature 98.1 F (36.7  C), temperature source Oral, resp. rate 18, height '5\' 5"'$  (1.651 m), weight 76.3 kg (168 lb 4.8 oz), SpO2 100 %.  I/O:    Intake/Output Summary (Last 24 hours) at 01/04/17 1132 Last data filed at 01/03/17 1356  Gross per 24 hour  Intake              240 ml  Output                0 ml  Net              240 ml    PHYSICAL EXAMINATION:  GENERAL:  61 y.o.-year-old patient lying in the bed with no acute distress.  EYES: Pupils equal, round, reactive to light and accommodation. No scleral icterus. Extraocular muscles intact.  HEENT: Head atraumatic, normocephalic. Oropharynx and nasopharynx clear.  NECK:  Supple, no jugular venous distention. No thyroid enlargement, no tenderness.  LUNGS: Normal breath sounds bilaterally, no wheezing, rales,rhonchi or crepitation. No use of accessory muscles of respiration.  CARDIOVASCULAR: S1, S2 normal.  No murmurs, rubs, or gallops.  ABDOMEN: Soft, non-tender, non-distended. Bowel sounds present. No organomegaly or mass.  EXTREMITIES: No pedal edema, cyanosis, or clubbing.  NEUROLOGIC: Cranial nerves II through XII are intact. Muscle strength 5/5 in all extremities. Sensation intact. Gait not checked.  PSYCHIATRIC: The patient is alert and oriented x 3.  SKIN: No obvious rash, lesion, or ulcer.   DATA REVIEW:   CBC   Recent Labs Lab 01/03/17 0528  WBC 6.3  HGB 9.8*  HCT 30.9*  PLT 581*    Chemistries   Recent Labs Lab 01/02/17 1040 01/03/17 0528  NA 137 136  K 3.8 3.6  CL 100* 103  CO2 30 29  GLUCOSE 88 97  BUN 20 16  CREATININE 0.63 0.54  CALCIUM 8.2* 7.8*  AST 13*  --   ALT 21  --   ALKPHOS 35*  --   BILITOT 0.6  --     Microbiology Results   Recent Results (from the past 240 hour(s))  CULTURE, BLOOD (ROUTINE X 2) w Reflex to ID Panel     Status: None (Preliminary result)   Collection Time: 01/02/17  4:12 PM  Result Value Ref Range Status   Specimen Description BLOOD R ARM  Final   Special Requests BOTTLES DRAWN AEROBIC AND ANAEROBIC BCAV  Final   Culture NO GROWTH 2 DAYS  Final   Report Status PENDING  Incomplete  CULTURE, BLOOD (ROUTINE X 2) w Reflex to ID Panel     Status: None (Preliminary result)   Collection Time: 01/02/17  4:12 PM  Result Value Ref Range Status   Specimen Description BLOOD  R HAND  Final   Special Requests BOTTLES DRAWN AEROBIC AND ANAEROBIC BCLV  Final   Culture NO GROWTH 2 DAYS  Final   Report Status PENDING  Incomplete  Body fluid culture     Status: None (Preliminary result)   Collection Time: 01/03/17  2:49 PM  Result Value Ref Range Status   Specimen Description PLEURAL  Final   Special Requests NONE  Final   Gram Stain   Final    FEW WBC PRESENT, PREDOMINANTLY MONONUCLEAR NO ORGANISMS SEEN    Culture   Final    NO GROWTH < 24 HOURS Performed at [facility], [place] [address] [place], [place] [address]     Report Status PENDING  Incomplete    RADIOLOGY:  Dg Chest 1 View  Result Date:  01/03/2017 CLINICAL DATA:  History of metastatic lung cancer, now with recurrent symptomatic left-sided pleural effusion. Post left-sided thoracentesis EXAM: CHEST 1 VIEW COMPARISON:  Chest radiograph - 01/02/2017 FINDINGS: Grossly unchanged cardiac silhouette and mediastinal contours. Interval reduction/resolution of left-sided pleural effusion post thoracentesis. No pneumothorax. Pleuroparenchymal thickening about the periphery of the right lung is unchanged. Grossly unchanged nodular interstitial opacities throughout the bilateral pulmonary parenchyma, right greater than left. No discrete focal airspace opacities. No evidence of edema peer Stable position of support apparatus. No acute osseus abnormalities. IMPRESSION: 1. Interval reduction/resolution of left-sided effusion post thoracentesis. No pneumothorax. 2. Similar findings compatible with provided history of metastatic lung cancer with chronic right-sided pleuroparenchymal thickening and findings worrisome for lymphangitic spread of tumor involving the bilateral lungs, right greater than left, grossly unchanged. 3. No new focal airspace opacities. Electronically Signed   By: Sandi Mariscal M.D.   On: 01/03/2017 15:34   Dg Chest Portable 1 View  Result Date: 01/02/2017 CLINICAL DATA:  Stage IV lung malignancy. Unable to receive chemotherapy today due to generalized weakness. Currently cleaning being treated with clindamycin for breast infection. Known metastatic lung malignancy. EXAM: PORTABLE CHEST 1 VIEW COMPARISON:  PA and lateral chest x-ray of December 24, 2016 FINDINGS: There is persistent volume loss on the right. The interstitial markings remain increased. A moderate sized right pleural effusion laterally and inferiorly persists. On the left the interstitial markings are slightly more conspicuous overall. The cardiac silhouette is not enlarged. There is calcification  in the wall of the aortic arch. The central pulmonary vascularity is prominent. The bony thorax exhibits no acute abnormality. IMPRESSION: Persistent increased interstitial markings in the right lung with moderate-sized right pleural effusion. Slight interval worsening in the appearance of the left lung interstitial markings. The findings may reflect asymmetric pulmonary edema or worsening pneumonia. Lymphangitic spread of malignancy could present in a similar fashion. Electronically Signed   By: David  Martinique M.D.   On: 01/02/2017 12:02   US Thoracentesis Asp Pleural Space W/img Guide  Result Date: 01/03/2017 INDICATION: History of metastatic lung cancer, now with recurrent symptomatic left-sided pleural effusion. Please from ultrasound-guided thoracentesis for diagnostic and therapeutic purposes. EXAM: US THORACENTESIS ASP PLEURAL SPACE W/IMG GUIDE COMPARISON:  Chest CT - 11/30/2016; attempted ultrasound-guided right-sided thoracentesis - 10/26/2016 MEDICATIONS: None. COMPLICATIONS: None immediate. TECHNIQUE: Informed written consent was obtained from the patient after a discussion of the risks, benefits and alternatives to treatment. A timeout was performed prior to the initiation of the procedure. Initial ultrasound scanning demonstrates a moderate sized left-sided pleural effusion. The lower chest was prepped and draped in the usual sterile fashion. 1% lidocaine was used for local anesthesia. An ultrasound image was saved for documentation purposes. An 8 Fr Safe-T-Centesis catheter was introduced. The thoracentesis was performed. The catheter was removed and a dressing was applied. The patient tolerated the procedure well without immediate post procedural complication. The patient was escorted to have an upright chest radiograph. FINDINGS: A total of approximately 1 liter of serous fluid was removed. Requested samples were sent to the laboratory. IMPRESSION: Successful ultrasound-guided left sided  thoracentesis yielding 1 liter of pleural fluid. Electronically Signed   By: Sandi Mariscal M.D.   On: 01/03/2017 15:39     Management plans discussed with the patient, family and they are in agreement.  CODE STATUS:     Code Status Orders        Start     Ordered   01/02/17 1240  Full code  Continuous  01/02/17 1240    Code Status History    Date Active Date Inactive Code Status Order ID Comments User Context   12/24/2016  8:09 PM 12/26/2016  5:30 PM Full Code [id number]  Henreitta Leber, MD Inpatient   05/16/2016  4:13 PM 05/17/2016  7:49 AM Full Code [id number]  Hillary Bow, MD ED    Advance Directive Documentation     Most Recent Value  Type of Advance Directive  Healthcare Power of Attorney  Pre-existing out of facility DNR order (yellow form or pink MOST form)  -  "MOST" Form in Place?  -      TOTAL TIME TAKING CARE OF THIS PATIENT:  40 minutes.    Ngozi Alvidrez M.D on 01/04/2017 at 11:32 AM  Between 7am to 6pm - Pager - [phone] After 6pm go to [email or web address] - password [email or web address] [facility]  Office  [phone]  CC: Primary care physician; Marguerita Merles, MD

## 2017-01-04 NOTE — Care Management Note (Signed)
Case Management Note  Patient Details  Name: Patricia Kelley MRN: [id number] Date of Birth: July 17, 1956  Subjective/Objective:  Discharging today.                   Action/Plan: Requested resumption of care orders from attending. Advanced notified of discharge. Patient updated  Expected Discharge Date:  01/04/17               Expected Discharge Plan:  [place]  In-House Referral:     Discharge planning Services  CM Consult  Post Acute Care Choice:  [place], Resumption of Svcs/PTA Provider Choice offered to:     DME Arranged:    DME Agency:     HH Arranged:  RN, PT [place] Agency:  [facility]  Status of Service:  Completed, signed off  If discussed at [place] of Stay Meetings, dates discussed:    Additional Comments:  Jolly Mango, RN 01/04/2017, 11:40 AM

## 2017-01-04 NOTE — Discharge Summary (Signed)
[facility] NAME: Patricia Kelley    MR#:  [id number]  DATE OF BIRTH:  [birth year]  DATE OF ADMISSION:  01/02/2017    ADMITTING PHYSICIAN: Hillary Bow, MD  DATE OF DISCHARGE: 01/04/2017  PRIMARY CARE PHYSICIAN: Marguerita Merles, MD    ADMISSION DIAGNOSIS:  HCAP (healthcare-associated pneumonia) [J18.9]  DISCHARGE DIAGNOSIS:   Acute on chronic respiratory failure secondary to right-sided pleural effusion status post thoracentesis was removed on 01/03/2017 Lymphangitic spread of tumor Chronic respiratory failure on oxygen Anemia of chronic disease SECONDARY DIAGNOSIS:       Past Medical History:  Diagnosis Date  . Anemia 01/21/2015  . 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 Anxiety   . GERD (gastroesophageal reflux disease)   . Hypercholesteremia   . Hypertension   . Last menstrual period (LMP) > 10 days ago 2007  . Lung cancer ([place])   . 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 She does have chronic shortness of breath and cough on 2 L oxygen which is unchanged. Here in the emergency room patient has been found to have bilateral infiltrates on the chest x-ray also concerning for possible lymphangitic spread.  * Acute on chronic respiratory failure secondary to Bilateral infiltrates concerning for Lymphagitic spread of tumor vs interstitial pneumonia -This favors more lymphangitic spread.  -cont levaquin short course Afebrile - pt  Is  s/p US thoracentesis for recurrent pleural effusion s/p 1 liter fluid removal Feels a lot batter ans sats 100 % on 2liter  * Chronic respiratory failure is stable.  * Hypertension -bp low  normal . I will hold lisinopril/hctz at d/c Cont lasix   * Stage IV lung cancer. Concern for lymphangitic spread on chest x-ray. We will consult oncology.  * Anemia of chronic disease is stable  * DVT prophylaxis - Lovenox  Overall better D/c home Discussed with patient and patient's sister-in-law  CONSULTS OBTAINED:  Treatment Team:  Lloyd Huger, MD  DRUG ALLERGIES:        Allergies  Allergen Reactions  . Tegaderm Ag Mesh [Silver] Other (See Comments)    blisters  . Aspirin Other (See Comments)    GIB    DISCHARGE MEDICATIONS:   Current Discharge Medication List        START taking these medications   Details  alum & mag hydroxide-simeth (MAALOX/MYLANTA) 200-200-20 MG/5ML suspension Take 15 mLs by mouth every 6 (six) hours as needed for indigestion or heartburn. Qty: 355 mL, Refills: 0    levofloxacin (LEVAQUIN) 750 MG tablet Take 1 tablet (750 mg total) by mouth daily. Qty: 2 tablet, Refills: 0          CONTINUE these medications which have NOT CHANGED   Details  ALPRAZolam (XANAX) 1 MG tablet TAKE 1/2 BY MOUTH 1 TO 2 TIMES DAILY AS NEEDED FOR ANXIETY AND TAKE 1 BY MOUTH AT NIGHT FOR ANXIETY/SLEEP Qty: 45 tablet, Refills: 0   Associated Diagnoses: Primary malignant neoplasm of right lung metastatic to other site (HCC)    diphenhydramine-acetaminophen (TYLENOL PM) 25-500 MG TABS tablet Take 1 tablet by mouth at bedtime as needed (sleep).    docusate sodium (COLACE) 100 MG capsule Take 100 mg by mouth 2 (two)  times daily as needed for mild constipation.   Associated Diagnoses: Primary malignant neoplasm of right lung metastatic to other site (HCC)    furosemide (LASIX) 20 MG tablet Take 1 tablet (20 mg total) by mouth daily. Qty: 30 tablet, Refills: 2    gabapentin (NEURONTIN) 300 MG capsule Take 1 capsule (300 mg total) by mouth 3 (three) times daily. Qty: 270 capsule, Refills: 3   Associated Diagnoses: Metastatic lung  cancer (metastasis from lung to other site), right (HCC)    lidocaine-prilocaine (EMLA) cream Apply cream 1 hour before chemotherapy treatment Qty: 30 g, Refills: 1    loratadine (CLARITIN) 10 MG tablet Take 10 mg by mouth daily.    Multiple Vitamins-Minerals (MULTIVITAMIN WITH MINERALS) tablet Take 1 tablet by mouth daily. Qty: 30 tablet, Refills: 3   Associated Diagnoses: Non-small cell carcinoma of lung, stage 4, right (HCC)    omeprazole (PRILOSEC) 20 MG capsule Take 1 capsule (20 mg total) by mouth daily. Qty: 30 capsule, Refills: 0    oxyCODONE (OXY IR/ROXICODONE) 5 MG immediate release tablet Take 1 tablet (5 mg total) by mouth 3 (three) times daily as needed for severe pain. Qty: 90 tablet, Refills: 0    potassium chloride SA (K-DUR,KLOR-CON) 20 MEQ tablet Take 1 tablet (20 mEq total) by mouth daily. Qty: 90 tablet, Refills: 1   Associated Diagnoses: Metastatic lung cancer (metastasis from lung to other site), right (HCC)    promethazine (PHENERGAN) 25 MG tablet Take 1 tablet (25 mg total) by mouth every 6 (six) hours as needed for nausea or vomiting. Qty: 30 tablet, Refills: 0    simvastatin (ZOCOR) 20 MG tablet Take 20 mg by mouth at bedtime.     traMADol (ULTRAM) 50 MG tablet Take 1 tablet (50 mg total) by mouth every 8 (eight) hours as needed for moderate pain. Qty: 60 tablet, Refills: 0    vitamin B-12 (CYANOCOBALAMIN) 1000 MCG tablet Take 1,000 mcg by mouth daily.         STOP taking these medications     clindamycin (CLEOCIN) 300 MG capsule      lisinopril-hydrochlorothiazide (PRINZIDE,ZESTORETIC) 10-12.5 MG per tablet         If you experience worsening of your admission symptoms, develop shortness of breath, life threatening emergency, suicidal or homicidal thoughts you must seek medical attention immediately by calling 911 or calling your MD immediately  if symptoms less severe.  You Must read complete instructions/literature  along with all the possible adverse reactions/side effects for all the Medicines you take and that have been prescribed to you. Take any new Medicines after you have completely understood and accept all the possible adverse reactions/side effects.   Please note  You were cared for by a hospitalist during your hospital stay. If you have any questions about your discharge medications or the care you received while you were in the hospital after you are discharged, you can call the unit and asked to speak with the hospitalist on call if the hospitalist that took care of you is not available. Once you are discharged, your primary care physician will handle any further medical issues. Please note that NO REFILLS for any discharge medications will be authorized once you are discharged, as it is imperative that you return to your primary care physician (or establish a relationship with a primary care physician if you do not have one) for your aftercare needs so that they can reassess your need for medications and monitor your lab values. Today  SUBJECTIVE   I am having heart burns  VITAL SIGNS:  Blood pressure 107/66, pulse 90, temperature 98.1 F (36.7 C), temperature source Oral, resp. rate 18, height '5\' 5"'$  (1.651 m), weight 76.3 kg (168 lb 4.8 oz), SpO2 100 %.  I/O:    Intake/Output Summary (Last 24 hours) at 01/04/17 1132 Last data filed at 01/03/17 1356  Gross per 24 hour  Intake              240 ml  Output                0 ml  Net              240 ml    PHYSICAL EXAMINATION:  GENERAL:  61 y.o.-year-old patient lying in the bed with no acute distress.  EYES: Pupils equal, round, reactive to light and accommodation. No scleral icterus. Extraocular muscles intact.  HEENT: Head atraumatic, normocephalic. Oropharynx and nasopharynx clear.  NECK:  Supple, no jugular venous distention. No thyroid enlargement, no tenderness.  LUNGS: Normal breath sounds bilaterally, no wheezing,  rales,rhonchi or crepitation. No use of accessory muscles of respiration.  CARDIOVASCULAR: S1, S2 normal. No murmurs, rubs, or gallops.  ABDOMEN: Soft, non-tender, non-distended. Bowel sounds present. No organomegaly or mass.  EXTREMITIES: No pedal edema, cyanosis, or clubbing.  NEUROLOGIC: Cranial nerves II through XII are intact. Muscle strength 5/5 in all extremities. Sensation intact. Gait not checked.  PSYCHIATRIC: The patient is alert and oriented x 3.  SKIN: No obvious rash, lesion, or ulcer.   DATA REVIEW:   CBC   Last Labs    Recent Labs Lab 01/03/17 0528  WBC 6.3  HGB 9.8*  HCT 30.9*  PLT 581*      Chemistries   Last Labs    Recent Labs Lab 01/02/17 1040 01/03/17 0528  NA 137 136  K 3.8 3.6  CL 100* 103  CO2 30 29  GLUCOSE 88 97  BUN 20 16  CREATININE 0.63 0.54  CALCIUM 8.2* 7.8*  AST 13*  --   ALT 21  --   ALKPHOS 35*  --   BILITOT 0.6  --       Microbiology Results          Recent Results (from the past 240 hour(s))  CULTURE, BLOOD (ROUTINE X 2) w Reflex to ID Panel     Status: None (Preliminary result)   Collection Time: 01/02/17  4:12 PM  Result Value Ref Range Status   Specimen Description BLOOD R ARM  Final   Special Requests BOTTLES DRAWN AEROBIC AND ANAEROBIC BCAV  Final   Culture NO GROWTH 2 DAYS  Final   Report Status PENDING  Incomplete  CULTURE, BLOOD (ROUTINE X 2) w Reflex to ID Panel     Status: None (Preliminary result)   Collection Time: 01/02/17  4:12 PM  Result Value Ref Range Status   Specimen Description BLOOD  R HAND  Final   Special Requests BOTTLES DRAWN AEROBIC AND ANAEROBIC BCLV  Final   Culture NO GROWTH 2 DAYS  Final   Report Status PENDING  Incomplete  Body fluid culture     Status: None (Preliminary result)   Collection Time: 01/03/17  2:49 PM  Result Value Ref Range Status   Specimen Description PLEURAL  Final   Special Requests NONE  Final   Gram Stain   Final    FEW  WBC PRESENT, PREDOMINANTLY MONONUCLEAR NO ORGANISMS SEEN    Culture  Final    NO GROWTH < 24 HOURS Performed at [facility], [place] [address] [place], [place] [address]    Report Status PENDING  Incomplete    RADIOLOGY:   Imaging Results (Last 48 hours)  Dg Chest 1 View  Result Date: 01/03/2017 CLINICAL DATA:  History of metastatic lung cancer, now with recurrent symptomatic left-sided pleural effusion. Post left-sided thoracentesis EXAM: CHEST 1 VIEW COMPARISON:  Chest radiograph - 01/02/2017 FINDINGS: Grossly unchanged cardiac silhouette and mediastinal contours. Interval reduction/resolution of left-sided pleural effusion post thoracentesis. No pneumothorax. Pleuroparenchymal thickening about the periphery of the right lung is unchanged. Grossly unchanged nodular interstitial opacities throughout the bilateral pulmonary parenchyma, right greater than left. No discrete focal airspace opacities. No evidence of edema peer Stable position of support apparatus. No acute osseus abnormalities. IMPRESSION: 1. Interval reduction/resolution of left-sided effusion post thoracentesis. No pneumothorax. 2. Similar findings compatible with provided history of metastatic lung cancer with chronic right-sided pleuroparenchymal thickening and findings worrisome for lymphangitic spread of tumor involving the bilateral lungs, right greater than left, grossly unchanged. 3. No new focal airspace opacities. Electronically Signed   By: Sandi Mariscal M.D.   On: 01/03/2017 15:34   Dg Chest Portable 1 View  Result Date: 01/02/2017 CLINICAL DATA:  Stage IV lung malignancy. Unable to receive chemotherapy today due to generalized weakness. Currently cleaning being treated with clindamycin for breast infection. Known metastatic lung malignancy. EXAM: PORTABLE CHEST 1 VIEW COMPARISON:  PA and lateral chest x-ray of December 24, 2016 FINDINGS: There is persistent volume loss on the right. The interstitial  markings remain increased. A moderate sized right pleural effusion laterally and inferiorly persists. On the left the interstitial markings are slightly more conspicuous overall. The cardiac silhouette is not enlarged. There is calcification in the wall of the aortic arch. The central pulmonary vascularity is prominent. The bony thorax exhibits no acute abnormality. IMPRESSION: Persistent increased interstitial markings in the right lung with moderate-sized right pleural effusion. Slight interval worsening in the appearance of the left lung interstitial markings. The findings may reflect asymmetric pulmonary edema or worsening pneumonia. Lymphangitic spread of malignancy could present in a similar fashion. Electronically Signed   By: David  Martinique M.D.   On: 01/02/2017 12:02   US Thoracentesis Asp Pleural Space W/img Guide  Result Date: 01/03/2017 INDICATION: History of metastatic lung cancer, now with recurrent symptomatic left-sided pleural effusion. Please from ultrasound-guided thoracentesis for diagnostic and therapeutic purposes. EXAM: US THORACENTESIS ASP PLEURAL SPACE W/IMG GUIDE COMPARISON:  Chest CT - 11/30/2016; attempted ultrasound-guided right-sided thoracentesis - 10/26/2016 MEDICATIONS: None. COMPLICATIONS: None immediate. TECHNIQUE: Informed written consent was obtained from the patient after a discussion of the risks, benefits and alternatives to treatment. A timeout was performed prior to the initiation of the procedure. Initial ultrasound scanning demonstrates a moderate sized left-sided pleural effusion. The lower chest was prepped and draped in the usual sterile fashion. 1% lidocaine was used for local anesthesia. An ultrasound image was saved for documentation purposes. An 8 Fr Safe-T-Centesis catheter was introduced. The thoracentesis was performed. The catheter was removed and a dressing was applied. The patient tolerated the procedure well without immediate post procedural  complication. The patient was escorted to have an upright chest radiograph. FINDINGS: A total of approximately 1 liter of serous fluid was removed. Requested samples were sent to the laboratory. IMPRESSION: Successful ultrasound-guided left sided thoracentesis yielding 1 liter of pleural fluid. Electronically Signed   By: Sandi Mariscal M.D.   On: 01/03/2017  15:39      Management plans discussed with the patient, family and they are in agreement.  CODE STATUS:           Code Status Orders              Start     Ordered   01/02/17 1240  Full code  Continuous     01/02/17 1240                      Code Status History    Date Active Date Inactive Code Status Order ID Comments User Context   12/24/2016  8:09 PM 12/26/2016  5:30 PM Full Code [id number]  Henreitta Leber, MD Inpatient   05/16/2016  4:13 PM 05/17/2016  7:49 AM Full Code [id number]  Hillary Bow, MD ED          Advance Directive Documentation     Most Recent Value  Type of Advance Directive  Healthcare Power of Attorney  Pre-existing out of facility DNR order (yellow form or pink MOST form)  -  "MOST" Form in Place?  -      TOTAL TIME TAKING CARE OF THIS PATIENT:  40 minutes.    Remmy Riffe M.D on 01/04/2017 at 11:32 AM  Between 7am to 6pm - Pager - [phone] After 6pm go to [email or web address] - password [email or web address] [facility]  Office  [phone]  CC: Primary care physician; Marguerita Merles, MD

## 2017-01-05 ENCOUNTER — Telehealth: Payer: Self-pay | Admitting: *Deleted

## 2017-01-05 MED ORDER — SUCRALFATE 1 G PO TABS: 1.0000 g | ORAL_TABLET | Freq: Four times a day (QID) | ORAL | 1 refills | Status: AC

## 2017-01-05 NOTE — Telephone Encounter (Signed)
OK for both per Dr Grayland Ormond, Carafate e scribed, PC referral to faxed at [phone]

## 2017-01-05 NOTE — Telephone Encounter (Signed)
Asking that we send a palliative Care referral for [facility], she has agreed to [facility], but not hospice. Also states she is having pain when swallowing and is wandering is she has an ulcer in her esophagus. Asking for Carafate order for her as Mylanta is not helping. Please advise

## 2017-01-07 LAB — CULTURE, BLOOD (ROUTINE X 2)
Culture: NO GROWTH
Culture: NO GROWTH

## 2017-01-07 LAB — BODY FLUID CULTURE: CULTURE: NO GROWTH

## 2017-01-08 ENCOUNTER — Other Ambulatory Visit: Payer: Self-pay | Admitting: Family Medicine

## 2017-01-08 DIAGNOSIS — R928 Other abnormal and inconclusive findings on diagnostic imaging of breast: Secondary | ICD-10-CM

## 2017-01-10 ENCOUNTER — Emergency Department: Payer: BLUE CROSS/BLUE SHIELD

## 2017-01-10 ENCOUNTER — Emergency Department
Admission: EM | Admit: 2017-01-10 | Discharge: 2017-01-12 | Disposition: A | Discharge status: Home / Self Care | Payer: BLUE CROSS/BLUE SHIELD | Attending: Emergency Medicine | Admitting: Emergency Medicine

## 2017-01-10 ENCOUNTER — Encounter: Payer: Self-pay | Admitting: Emergency Medicine

## 2017-01-10 DIAGNOSIS — W1839XA Other fall on same level, initial encounter: Secondary | ICD-10-CM | POA: Diagnosis not present

## 2017-01-10 DIAGNOSIS — I10 Essential (primary) hypertension: Secondary | ICD-10-CM | POA: Diagnosis not present

## 2017-01-10 DIAGNOSIS — Y999 Unspecified external cause status: Secondary | ICD-10-CM | POA: Diagnosis not present

## 2017-01-10 DIAGNOSIS — Y9389 Activity, other specified: Secondary | ICD-10-CM | POA: Diagnosis not present

## 2017-01-10 DIAGNOSIS — S4992XA Unspecified injury of left shoulder and upper arm, initial encounter: Secondary | ICD-10-CM | POA: Diagnosis present

## 2017-01-10 DIAGNOSIS — R3129 Other microscopic hematuria: Secondary | ICD-10-CM

## 2017-01-10 DIAGNOSIS — Z85118 Personal history of other malignant neoplasm of bronchus and lung: Secondary | ICD-10-CM | POA: Diagnosis not present

## 2017-01-10 DIAGNOSIS — Z87891 Personal history of nicotine dependence: Secondary | ICD-10-CM | POA: Insufficient documentation

## 2017-01-10 DIAGNOSIS — Y92009 Unspecified place in unspecified non-institutional (private) residence as the place of occurrence of the external cause: Secondary | ICD-10-CM | POA: Diagnosis not present

## 2017-01-10 DIAGNOSIS — S46912A Strain of unspecified muscle, fascia and tendon at shoulder and upper arm level, left arm, initial encounter: Secondary | ICD-10-CM | POA: Diagnosis not present

## 2017-01-10 DIAGNOSIS — N390 Urinary tract infection, site not specified: Secondary | ICD-10-CM | POA: Diagnosis not present

## 2017-01-10 LAB — CBC WITH DIFFERENTIAL/PLATELET
BASOS ABS: 0.1 10*3/uL (ref 0–0.1)
BASOS PCT: 1 %
EOS ABS: 0.2 10*3/uL (ref 0–0.7)
Eosinophils Relative: 2 %
HCT: 32.9 % — ABNORMAL LOW (ref 35.0–47.0)
Hemoglobin: 10.7 g/dL — ABNORMAL LOW (ref 12.0–16.0)
LYMPHS PCT: 9 %
Lymphs Abs: 1.3 10*3/uL (ref 1.0–3.6)
MCH: 26.6 pg (ref 26.0–34.0)
MCHC: 32.5 g/dL (ref 32.0–36.0)
MCV: 81.9 fL (ref 80.0–100.0)
Monocytes Absolute: 1.2 10*3/uL — ABNORMAL HIGH (ref 0.2–0.9)
Monocytes Relative: 8 %
Neutro Abs: 11.4 10*3/uL — ABNORMAL HIGH (ref 1.4–6.5)
Neutrophils Relative %: 80 %
PLATELETS: 498 10*3/uL — AB (ref 150–440)
RBC: 4.01 MIL/uL (ref 3.80–5.20)
RDW: 18.1 % — ABNORMAL HIGH (ref 11.5–14.5)
WBC: 14.3 10*3/uL — AB (ref 3.6–11.0)

## 2017-01-10 LAB — URINALYSIS, COMPLETE (UACMP) WITH MICROSCOPIC
Bacteria, UA: NONE SEEN
Bilirubin Urine: NEGATIVE
GLUCOSE, UA: NEGATIVE mg/dL
Hgb urine dipstick: NEGATIVE
KETONES UR: 20 mg/dL — AB
Leukocytes, UA: NEGATIVE
NITRITE: NEGATIVE
PH: 6 (ref 5.0–8.0)
Protein, ur: 30 mg/dL — AB
SPECIFIC GRAVITY, URINE: 1.029 (ref 1.005–1.030)

## 2017-01-10 LAB — BASIC METABOLIC PANEL
Anion gap: 9 (ref 5–15)
BUN: 16 mg/dL (ref 6–20)
CO2: 31 mmol/L (ref 22–32)
Calcium: 8.5 mg/dL — ABNORMAL LOW (ref 8.9–10.3)
Chloride: 99 mmol/L — ABNORMAL LOW (ref 101–111)
Creatinine, Ser: 0.48 mg/dL (ref 0.44–1.00)
GFR calc Af Amer: >60 mL/min (ref >60)
Glucose, Bld: 98 mg/dL (ref 65–99)
POTASSIUM: 3.4 mmol/L — AB (ref 3.5–5.1)
SODIUM: 139 mmol/L (ref 135–145)

## 2017-01-10 MED ORDER — IOPAMIDOL (ISOVUE-370) INJECTION 76%
75.0000 mL | Freq: Once | INTRAVENOUS | Status: AC | PRN
  Administered 2017-01-10: 75 mL via INTRAVENOUS

## 2017-01-10 MED ORDER — CEPHALEXIN 500 MG PO CAPS: 500.0000 mg | ORAL_CAPSULE | Freq: Two times a day (BID) | ORAL | 0 refills | Status: AC

## 2017-01-10 MED ORDER — TRAMADOL HCL 50 MG PO TABS: 50.0000 mg | ORAL_TABLET | Freq: Four times a day (QID) | ORAL | 0 refills | Status: AC | PRN

## 2017-01-10 NOTE — ED Notes (Signed)
Called [employer] at 1545 to inform of consult.  No answer left message

## 2017-01-10 NOTE — Discharge Instructions (Signed)
Your tests today show a small amount of blood in the urine and likely a urinary tract infection.  Your xrays of the shoulder and chest do not show any new issues.  Take keflex for the urinary tract infection. Take tramadol as needed for the shoulder pain. Follow up with your primary care doctor for continued monitoring of your symptoms.

## 2017-01-10 NOTE — Evaluation (Signed)
Physical Therapy Evaluation Patient Details Name: Patricia Kelley MRN: [id number] DOB: August 09, 1956 Today's Date: 01/10/2017   History of Present Illness  Pt currently in ED secondary to complaints of falls yesterday with L shoulder pain. History includes anemia, GERD, HTN, and lung cancer. Pt currently wears 2L of O2 chronically. All imaging negative at this time.  Clinical Impression  Pt is a pleasant 61 year old female who was admitted for multiple falls. Pt performs bed mobility, transfers, and ambulation with min assist and RW. Pt very fearful of falls and takes increased encouragement to participate. Pt demonstrates deficits with strength/mobility/balance. B LE weaker compared to B UE. Needs hands on assist for all mobility at this time. Does not appear to be at baseline level. Would benefit from skilled PT to address above deficits and promote optimal return to PLOF; recommend transition to STR upon discharge from acute hospitalization.      Follow Up Recommendations SNF    Equipment Recommendations  None recommended by PT    Recommendations for Other Services       Precautions / Restrictions Precautions Precautions: Fall Restrictions Weight Bearing Restrictions: No      Mobility  Bed Mobility Overal bed mobility: Needs Assistance Bed Mobility: Supine to Sit     Supine to sit: Min assist     General bed mobility comments: needs assist for shoulders and trunk mobility as well as sliding B LE off bed. Once seated, needs cga for maintaining balance. Pt able to progress to sitting with supervision. Needs mod assist for return supine with assist for sliding towards HOB  Transfers Overall transfer level: Needs assistance Equipment used: Rolling walker (2 wheeled) Transfers: Sit to/from Stand Sit to Stand: Min assist         General transfer comment: assist for standing at bed side, using RW. ONly able to take a few seconds and then sits down suddenly secondary to  fatigue  Ambulation/Gait Ambulation/Gait assistance: Min assist Ambulation Distance (Feet): 1 Feet Assistive device: Rolling walker (2 wheeled) Gait Pattern/deviations: Step-to pattern     General Gait Details: able to take 1 step in forward/backward direction, however limited secondary to fatigue. Needs assist with cues for sequencing  Stairs            Wheelchair Mobility    Modified Rankin (Stroke Patients Only)       Balance Overall balance assessment: History of Falls Sitting-balance support: Single extremity supported;Feet unsupported Sitting balance-Leahy Scale: Fair     Standing balance support: Bilateral upper extremity supported Standing balance-Leahy Scale: Fair                               Pertinent Vitals/Pain Pain Assessment: Faces Faces Pain Scale: Hurts a little bit Pain Location: L shoulder with movement Pain Descriptors / Indicators: Discomfort Pain Intervention(s): Limited activity within patient's tolerance;Repositioned    Home Living Family/patient expects to be discharged to:: Private residence Living Arrangements: Spouse/significant other Available Help at Discharge: Family;Available 24 hours/day Type of Home: House Home Access: Stairs to enter Entrance Stairs-Rails: Right;Left;Can reach both Entrance Stairs-Number of Steps: 2 Home Layout: One level Home Equipment: Walker - 4 wheels;Tub bench;Grab bars - tub/shower;Bedside commode      Prior Function Level of Independence: Needs assistance   Gait / Transfers Assistance Needed: Has been having increased number of falls and is currently unable to ambulate per patient  ADL's / Homemaking Assistance Needed: Pt needs some  assistance with ADLs, pt reports she has two friends that come help her bath and dress, but she does what she can; pt's husband assists with IADLS and ADLs as needed        Hand Dominance        Extremity/Trunk Assessment   Upper Extremity  Assessment Upper Extremity Assessment: Generalized weakness (B UE grossly 3+/5)    Lower Extremity Assessment Lower Extremity Assessment: Generalized weakness (B LE grosly 3/5)       Communication   Communication: No difficulties  Cognition Arousal/Alertness: Awake/alert Behavior During Therapy: WFL for tasks assessed/performed Overall Cognitive Status: Within Functional Limits for tasks assessed                                        General Comments      Exercises     Assessment/Plan    PT Assessment Patient needs continued PT services  PT Problem List Decreased strength;Decreased mobility;Decreased activity tolerance;Decreased balance       PT Treatment Interventions Gait training;Therapeutic activities;Therapeutic exercise;Stair training;Balance training;Functional mobility training;Patient/family education;DME instruction    PT Goals (Current goals can be found in the Care Plan section)  Acute Rehab PT Goals Patient Stated Goal: to get stronger PT Goal Formulation: With patient Time For Goal Achievement: 01/24/17 Potential to Achieve Goals: Good    Frequency Min 2X/week   Barriers to discharge Inaccessible home environment;Decreased caregiver support      Co-evaluation               End of Session Equipment Utilized During Treatment: Gait belt;Oxygen Activity Tolerance: Patient limited by fatigue;No increased pain Patient left: in bed Nurse Communication: Mobility status PT Visit Diagnosis: Muscle weakness (generalized) (M62.81);Difficulty in walking, not elsewhere classified (R26.2)    Time: 3374-4514 PT Time Calculation (min) (ACUTE ONLY): 23 min   Charges:   PT Evaluation $PT Eval Low Complexity: 1 Procedure     PT G Codes:   PT G-Codes **NOT FOR INPATIENT CLASS** Functional Assessment Tool Used: AM-PAC 6 Clicks Basic Mobility Functional Limitation: Mobility: Walking and moving around Mobility: Walking and Moving Around  Current Status (U0479): At least 80 percent but less than 100 percent impaired, limited or restricted Mobility: Walking and Moving Around Goal Status [phone]): At least 60 percent but less than 80 percent impaired, limited or restricted    Greggory Stallion, PT, DPT [phone]   Patricia Kelley 01/10/2017, 5:26 PM

## 2017-01-10 NOTE — Clinical Social Work Note (Addendum)
CSW received consult for patient needing SNF for short term rehab.  CSW met with patient and explained what the process is for finding a bed for short term rehab.  CSW explained to patient pending insurance approval and PT recommendation will determine where she can go.  CSW also explained to her that there is a possibility that she may have to go out of county for SNF placement. Patient was agreeable to going to SNF for short term rehab, SNF bed search has been initiated in [facility] awaiting bed offers.  Jones Broom. Spray, MSW, [place]  01/10/2017 5:55 PM

## 2017-01-10 NOTE — ED Notes (Signed)
This  RN to bedside, explained to patient awaiting hospital bed for patient comfort, however waiting for house orderly to bring it. Pt states understanding. Pt visualized in NAD, pt repositioned by CNA2 students.

## 2017-01-10 NOTE — ED Notes (Signed)
This RN to bedside to check on patient. Explained and apologized for delay in this RN coming to room. Pt noted to be in NAD at this time, resting in bed eating apple sauce. Pt and family asking about results of X-ray and lab work, this RN explained that she would be unable to go over results, that MD needed to go over results. Pt and family state understanding at this time. MD notified of patient and family request for results.

## 2017-01-10 NOTE — ED Notes (Signed)
This RN to bedside, pt repositioned, offered food and drink, pt denies at this time. Pt is alert and oriented at this time. Reiterated to patient the use of call bell for further needs. Informed patient that consult to PT and SW had been placed. Pt states understanding.

## 2017-01-10 NOTE — ED Notes (Signed)
Dr. Joni Fears to bedside to speak with patient and family about D/C concerns. Pt visualized in NAD at this time. Resting in bed with family at bedside, will continue to monitor for further patient needs.

## 2017-01-10 NOTE — ED Notes (Signed)
This RN to bedside at this time. Pt placed on bedpan and repositioned in the bed. Clean sheets placed on patient at this time. Will continue to monitor for further patient needs.

## 2017-01-10 NOTE — Clinical Social Work Note (Signed)
Clinical Social Work Assessment  Patient Details  Name: Patricia Kelley MRN: [id number] Date of Birth: [birth year]  Date of referral:  01/10/17               Reason for consult:  Facility Placement                Permission sought to share information with:  Family Supports Permission granted to share information::  Yes, Verbal Permission Granted  Name::     Norena, [place] [phone]   Agency::   SNF admissions  Relationship::     Contact Information:     Housing/Transportation Living arrangements for the past 2 months:  Single Family Home Source of Information:  Patient Patient Interpreter Needed:  None Criminal Activity/Legal Involvement Pertinent to Current Situation/Hospitalization:  No - Comment as needed Significant Relationships:  Spouse, Other Family Members Lives with:  Spouse Do you feel safe going back to the place where you live?  No Need for family participation in patient care:  No (Coment)  Care giving concerns:  Patient feels she needs short term rehab before she is able to return back home.   Social Worker assessment / plan:  Patient is a 61 year old female who is alert and oriented x4 and able to express her feelings.  Patient is also being treated for Cancer and getting Chemo every two weeks at [facility].  Patient states she was just recently discharged from hospital to home with home health, but she now feels like she needs SNF placement due to having a fall.  Patient states she has not been to rehab before, CSW explained process for looking for SNF placement and how insurance will pay for the stay.  CSW explained role and what to expect at SNF.  Patient gave CSW permission to begin bed search process in [facility] and did not express any other questions or concerns.  Employment status:  Retired Forensic scientist:  Managed Care PT Recommendations:  [place] / Referral to community resources:  [place]  Patient/Family's Response to care:  Patient in agreement to going to SNF for short term rehab.  Patient/Family's Understanding of and Emotional Response to Diagnosis, Current Treatment, and Prognosis:  Patient expressed understanding and current treatment goal.  Emotional Assessment Appearance:  Appears stated age Attitude/Demeanor/Rapport:    Affect (typically observed):  Appropriate, Calm Orientation:  Oriented to Self, Oriented to Place, Oriented to  Time, Oriented to Situation Alcohol / Substance use:  Not Applicable Psych involvement (Current and /or in the community):  No (Comment)  Discharge Needs  Concerns to be addressed:  Lack of Support Readmission within the last 30 days:  Yes (01-04-17 to home) Current discharge risk:  Lack of support system Barriers to Discharge:  Ship broker, Continued Medical Work up   Anell Barr 01/10/2017, 5:48 PM

## 2017-01-10 NOTE — ED Notes (Signed)
This RN with MD to bedside at this time. Pt visualized in NAD. Will continue to monitor for further patient needs. MD to discuss further plan of action with patient.

## 2017-01-10 NOTE — ED Notes (Signed)
This RN to bedside at this time. Pt visualized in NAD. Pt noted to be resting in bed, requests her lights be turned off. Will continue to monitor for further patient needs at this time.

## 2017-01-10 NOTE — ED Notes (Signed)
PT at bedside at this time 

## 2017-01-10 NOTE — Clinical Social Work Placement (Signed)
   CLINICAL SOCIAL WORK PLACEMENT  NOTE  Date:  01/10/2017  Patient Details  Name: Patricia Kelley MRN: [id number] Date of Birth: 04-16-1956  Clinical Social Work is seeking post-discharge placement for this patient at the [place] level of care (*CSW will initial, date and re-position this form in  chart as items are completed):  Yes   Patient/family provided with [facility] Work Department's list of facilities offering this level of care within the geographic area requested by the patient (or if unable, by the patient's family).  Yes   Patient/family informed of their freedom to choose among providers that offer the needed level of care, that participate in Medicare, Medicaid or managed care program needed by the patient, have an available bed and are willing to accept the patient.  Yes   Patient/family informed of [facility]'s ownership interest in [facility] and [facility], as well as of the fact that they are under no obligation to receive care at these facilities.  PASRR submitted to EDS on 01/10/17     PASRR number received on 01/10/17     Existing PASRR number confirmed on       FL2 transmitted to all facilities in geographic area requested by pt/family on 01/10/17     FL2 transmitted to all facilities within larger geographic area on       Patient informed that his/her managed care company has contracts with or will negotiate with certain facilities, including the following:            Patient/family informed of bed offers received.  Patient chooses bed at       Physician recommends and patient chooses bed at      Patient to be transferred to   on  .  Patient to be transferred to facility by       Patient family notified on   of transfer.  Name of family member notified:        PHYSICIAN Please sign FL2     Additional Comment:    _______________________________________________ Ross Ludwig,  LCSWA 01/10/2017, 5:53 PM

## 2017-01-10 NOTE — ED Notes (Signed)
This RN at bedside, assisted patient onto the bedpan. Offered patient food, pt denies stating her husband is supposed to bring her food. Pt repositioned in the bed. Will continue to monitor for further patient needs.

## 2017-01-10 NOTE — ED Notes (Signed)
Social work at bedside at this time.

## 2017-01-10 NOTE — ED Notes (Signed)
This RN to bedside, pt given a hospital bed at this time. Pt transferred without incident. Pt visualized in NAD at this time. Skin warm, dry, and intact, pt alert and oriented at baseline. Will continue to monitor for further patient needs.

## 2017-01-10 NOTE — ED Notes (Signed)
Put patient on bedpan. Then pulled patient up in bed. Patient was given food tray and coffee per Dr Joni Fears.

## 2017-01-10 NOTE — NC FL2 (Signed)
[place] LEVEL OF CARE SCREENING TOOL     IDENTIFICATION  Patient Name: Patricia Kelley Birthdate: [birth year] Sex: female Admission Date (Current Location): 01/10/2017  [place] and [place] Number:  Engineering geologist and Address:  [facility], [address] [place], [place] [address]      Provider Number: [id number]  Attending Physician Name and Address:  Carrie Mew, MD  Relative Name and Phone Number:  De, Libman [phone]     Current Level of Care: Hospital Recommended Level of Care: [place] Prior Approval Number:    Date Approved/Denied:   PASRR Number: [id number]  Discharge Plan: SNF    Current Diagnoses: Patient Active Problem List   Diagnosis Date Noted  . Pneumonia 01/02/2017  . Cellulitis of right breast 12/24/2016  . Goals of care, counseling/discussion 12/03/2016  . Chest pain 05/16/2016  . Metastatic lung cancer (metastasis from lung to other site) ([place]) 01/21/2015  . 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 Empyema of lung ([place]) 01/13/2015  . MRSA (methicillin resistant Staphylococcus aureus) infection 01/13/2015  . Malignant neoplasm of overlapping sites of right lung ([place]) 01/13/2015    Orientation RESPIRATION BLADDER Height & Weight     Self, Time, Situation, Place  O2 (2L) Continent Weight: 168 lb 5 oz (76.3 kg) Height:  '5\' 5"'$  (165.1 cm)  BEHAVIORAL SYMPTOMS/MOOD NEUROLOGICAL BOWEL NUTRITION STATUS      Continent Diet (Cardiac Diet)  AMBULATORY STATUS COMMUNICATION OF NEEDS Skin   Limited Assist Verbally Normal                       Personal Care Assistance Level of Assistance  Bathing, Feeding, Dressing Bathing Assistance: Limited assistance Feeding assistance: Independent Dressing Assistance: Limited assistance     Functional Limitations Info  Sight, Hearing, Speech Sight Info: Adequate Hearing Info: Adequate Speech Info: Adequate    SPECIAL  CARE FACTORS FREQUENCY  PT (By licensed PT)     PT Frequency: 5x a week              Contractures Contractures Info: Not present    Additional Factors Info  Code Status, Psychotropic, Allergies Code Status Info: Full Code Allergies Info: TEGADERM AG MESH SILVER, ASPIRIN  Psychotropic Info: ALPRAZolam (XANAX) 1 MG tablet         Current Medications (01/10/2017):  This is the current hospital active medication list No current facility-administered medications for this encounter.    Current Outpatient Prescriptions  Medication Sig Dispense Refill  . ALPRAZolam (XANAX) 1 MG tablet TAKE 1/2 BY MOUTH 1 TO 2 TIMES DAILY AS NEEDED FOR ANXIETY AND TAKE 1 BY MOUTH AT NIGHT FOR ANXIETY/SLEEP 45 tablet 0  . alum & mag hydroxide-simeth (MAALOX/MYLANTA) 200-200-20 MG/5ML suspension Take 15 mLs by mouth every 6 (six) hours as needed for indigestion or heartburn. 355 mL 0  . diphenhydramine-acetaminophen (TYLENOL PM) 25-500 MG TABS tablet Take 1 tablet by mouth at bedtime as needed (sleep).    . docusate sodium (COLACE) 100 MG capsule Take 100 mg by mouth 2 (two) times daily as needed for mild constipation.    . furosemide (LASIX) 20 MG tablet Take 1 tablet (20 mg total) by mouth daily. 30 tablet 2  . gabapentin (NEURONTIN) 300 MG capsule Take 1 capsule (300 mg total) by mouth 3 (three) times daily. 270 capsule 3  . lidocaine-prilocaine (EMLA) cream Apply cream 1 hour before chemotherapy treatment 30 g 1  . loratadine (CLARITIN) 10  MG tablet Take 10 mg by mouth daily.    . Multiple Vitamins-Minerals (MULTIVITAMIN WITH MINERALS) tablet Take 1 tablet by mouth daily. 30 tablet 3  . omeprazole (PRILOSEC) 20 MG capsule Take 1 capsule (20 mg total) by mouth daily. 30 capsule 0  . oxyCODONE (OXY IR/ROXICODONE) 5 MG immediate release tablet Take 1 tablet (5 mg total) by mouth 3 (three) times daily as needed for severe pain. 90 tablet 0  . potassium chloride SA (K-DUR,KLOR-CON) 20 MEQ tablet Take 1  tablet (20 mEq total) by mouth daily. 90 tablet 1  . promethazine (PHENERGAN) 25 MG tablet Take 1 tablet (25 mg total) by mouth every 6 (six) hours as needed for nausea or vomiting. 30 tablet 0  . simvastatin (ZOCOR) 20 MG tablet Take 20 mg by mouth at bedtime.     . sucralfate (CARAFATE) 1 g tablet Take 1 tablet (1 g total) by mouth 4 (four) times daily. 120 tablet 1  . vitamin B-12 (CYANOCOBALAMIN) 1000 MCG tablet Take 1,000 mcg by mouth daily.    . cephALEXin (KEFLEX) 500 MG capsule Take 1 capsule (500 mg total) by mouth 2 (two) times daily. 14 capsule 0  . traMADol (ULTRAM) 50 MG tablet Take 1 tablet (50 mg total) by mouth every 6 (six) hours as needed. 15 tablet 0   Facility-Administered Medications Ordered in Other Encounters  Medication Dose Route Frequency Provider Last Rate Last Dose  . heparin lock flush 100 unit/mL  250 Units Intracatheter PRN Evlyn Kanner, NP      . sodium chloride 0.9 % injection 10 mL  10 mL Intracatheter PRN Evlyn Kanner, NP      . sodium chloride 0.9 % injection 10 mL  10 mL Intravenous PRN Leia Alf, MD   10 mL at 04/27/15 0905  . sodium chloride 0.9 % injection 3 mL  3 mL Intracatheter PRN Evlyn Kanner, NP         Discharge Medications: Please see discharge summary for a list of discharge medications.  Relevant Imaging Results:  Relevant Lab Results:   Additional Information SSN [id number], patient gets chemo every two weeks at [facility] center.  Kursten Kruk, Jones Broom, LCSWA

## 2017-01-10 NOTE — ED Notes (Signed)
Pt returned from Xray at this time  

## 2017-01-10 NOTE — ED Notes (Signed)
This RN to bedside, pt is noted to be eating a chick fil a sand which, family at bedside at this time. Denies any needs at this time. Will continue to monitor for further patient needs.

## 2017-01-10 NOTE — ED Triage Notes (Signed)
Ems from home s/p fall yesterday where legs gave out. c/o left shoulder pain. 10/10 with movement. Pt on 2 LNC o2 for stage 4 lung CA.

## 2017-01-10 NOTE — ED Notes (Signed)
Called PT at [phone]  No answer

## 2017-01-10 NOTE — ED Notes (Signed)
This RN to bedside, explained needed a urine sample from patient. Pt states understanding, states she is unable to walk due to weakness at this time. This RN spoke with patient regarding possible bed pan or in and out catheter. Pt states understanding at this time.

## 2017-01-10 NOTE — ED Provider Notes (Addendum)
[facility] Emergency Department Provider Note  ____________________________________________  Time seen: Approximately 3:12 PM  I have reviewed the triage vital signs and the nursing notes.   HISTORY  Chief Complaint Shoulder Pain    HPI Patricia Kelley is a 61 y.o. female who complains of left shoulder pain after a mechanical fall at home yesterday. She reports that she has chronic generalized weakness per she saw her PCP yesterday for this and was set up with home health and home physical therapy. Yesterday after seeing PCP she was walking in the home with in her legs "gave out" and she fell onto her left shoulder. Denies any loss of consciousness or head injury. No neck pain. She does note that she normally walks with a walker but yesterda she was not using the walker.  No other acute complaints. Takes tramadol at home for pain but is running out. Reports her heart rate is always fast No distal weakness or paresthesia.    Past Medical History:  Diagnosis Date  . Anemia 01/21/2015  . 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 Anxiety   . GERD (gastroesophageal reflux disease)   . Hypercholesteremia   . Hypertension   . Last menstrual period (LMP) > 10 days ago 2007  . Lung cancer ([place])   . Metastatic lung cancer (metastasis from lung to other site) [facility]) 01/21/2015     Patient Active Problem List   Diagnosis Date Noted  . Pneumonia 01/02/2017  . Cellulitis of right breast 12/24/2016  . Goals of care, counseling/discussion 12/03/2016  . Chest pain 05/16/2016  . Metastatic lung cancer (metastasis from lung to other site) ([place]) 01/21/2015  . 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 Empyema of lung ([place]) 01/13/2015  . MRSA (methicillin resistant Staphylococcus aureus) infection 01/13/2015  . Malignant neoplasm of overlapping sites of right lung ([place]) 01/13/2015     Past Surgical History:  Procedure Laterality Date  . ABDOMINAL HYSTERECTOMY      partial  . INSERTION / PLACEMENT PLEURAL CATHETER    . PORTACATH PLACEMENT Right 04/26/2015   Procedure: INSERTION PORT-A-CATH;  Surgeon: Nestor Lewandowsky, MD;  Location: [facility] ORS;  Service: General;  Laterality: Right;     Prior to Admission medications   Medication Sig Start Date End Date Taking? Authorizing Provider  ALPRAZolam (XANAX) 1 MG tablet TAKE 1/2 BY MOUTH 1 TO 2 TIMES DAILY AS NEEDED FOR ANXIETY AND TAKE 1 BY MOUTH AT NIGHT FOR ANXIETY/SLEEP 12/04/16  Yes Lloyd Huger, MD  alum & mag hydroxide-simeth (MAALOX/MYLANTA) 200-200-20 MG/5ML suspension Take 15 mLs by mouth every 6 (six) hours as needed for indigestion or heartburn. 01/04/17  Yes Fritzi Mandes, MD  diphenhydramine-acetaminophen (TYLENOL PM) 25-500 MG TABS tablet Take 1 tablet by mouth at bedtime as needed (sleep).   Yes Historical Provider, MD  docusate sodium (COLACE) 100 MG capsule Take 100 mg by mouth 2 (two) times daily as needed for mild constipation.   Yes Historical Provider, MD  furosemide (LASIX) 20 MG tablet Take 1 tablet (20 mg total) by mouth daily. 06/21/16  Yes Lloyd Huger, MD  gabapentin (NEURONTIN) 300 MG capsule Take 1 capsule (300 mg total) by mouth 3 (three) times daily. 05/16/16  Yes Lloyd Huger, MD  lidocaine-prilocaine (EMLA) cream Apply cream 1 hour before chemotherapy treatment 10/19/15  Yes Lloyd Huger, MD  loratadine (CLARITIN) 10 MG tablet Take 10 mg by mouth daily.   Yes Historical Provider, MD  Multiple Vitamins-Minerals (MULTIVITAMIN WITH MINERALS)  tablet Take 1 tablet by mouth daily. 04/27/15  Yes Leia Alf, MD  omeprazole (PRILOSEC) 20 MG capsule Take 1 capsule (20 mg total) by mouth daily. 05/17/16  Yes Lytle Butte, MD  oxyCODONE (OXY IR/ROXICODONE) 5 MG immediate release tablet Take 1 tablet (5 mg total) by mouth 3 (three) times daily as needed for severe pain. 09/19/16  Yes Lloyd Huger, MD  potassium chloride SA (K-DUR,KLOR-CON) 20 MEQ tablet Take 1 tablet (20 mEq  total) by mouth daily. 05/09/16  Yes Lloyd Huger, MD  promethazine (PHENERGAN) 25 MG tablet Take 1 tablet (25 mg total) by mouth every 6 (six) hours as needed for nausea or vomiting. 08/09/16  Yes Cammie Sickle, MD  simvastatin (ZOCOR) 20 MG tablet Take 20 mg by mouth at bedtime.    Yes Historical Provider, MD  sucralfate (CARAFATE) 1 g tablet Take 1 tablet (1 g total) by mouth 4 (four) times daily. 01/05/17  Yes Lloyd Huger, MD  vitamin B-12 (CYANOCOBALAMIN) 1000 MCG tablet Take 1,000 mcg by mouth daily.   Yes Historical Provider, MD  cephALEXin (KEFLEX) 500 MG capsule Take 1 capsule (500 mg total) by mouth 2 (two) times daily. 01/10/17   Carrie Mew, MD  traMADol (ULTRAM) 50 MG tablet Take 1 tablet (50 mg total) by mouth every 6 (six) hours as needed. 01/10/17   Carrie Mew, MD     Allergies Tegaderm ag mesh [silver] and Aspirin   Family History  Problem Relation Age of Onset  . Cervical cancer Mother   . CVA Mother   . Uterine cancer Maternal Grandmother   . CVA Father     Social History Social History  Substance Use Topics  . Smoking status: Former Smoker    Packs/day: 0.50    Years: 40.00    Types: Cigarettes    Quit date: 07/20/2014  . Smokeless tobacco: Never Used  . Alcohol use Yes     Comment: socially    Review of Systems  Constitutional:   No fever or chills.  ENT:   No sore throat. No rhinorrhea. Lymphatic: No swollen glands, No extremity swelling Endocrine: No hot/cold flashes. No significant weight change. No neck swelling. Cardiovascular:   No chest pain or syncope. Respiratory:   No dyspnea or cough. Gastrointestinal:   Negative for abdominal pain, vomiting and diarrhea.  Genitourinary:   Negative for dysuria or difficulty urinating. Musculoskeletal:   Left shoulder pain as above Neurological:   Negative for headaches or weakness. All other systems reviewed and are negative except as documented above in ROS and  HPI.  ____________________________________________   PHYSICAL EXAM:  VITAL SIGNS: ED Triage Vitals  Enc Vitals Group     BP 01/10/17 0938 115/81     Pulse Rate 01/10/17 0938 (!) 107     Resp 01/10/17 1100 14     Temp 01/10/17 0938 98.2 F (36.8 C)     Temp Source 01/10/17 0938 Oral     SpO2 01/10/17 0938 96 %     Weight 01/10/17 0939 168 lb 5 oz (76.3 kg)     Height 01/10/17 0939 '5\' 5"'$  (1.651 m)     Head Circumference --      [facility] Flow --      Pain Score 01/10/17 0937 10     Pain Loc --      Pain Edu? --      Excl. in [place]? --     Vital signs reviewed, nursing assessments reviewed.  Constitutional:   Alert and oriented. Well appearing and in no distress. Eyes:   No scleral icterus. No conjunctival pallor. PERRL. EOMI.  No nystagmus. ENT   Head:   Normocephalic and atraumatic.   Nose:   No congestion/rhinnorhea. No septal hematoma   Mouth/Throat:   MMM, no pharyngeal erythema. No peritonsillar mass.    Neck:   No stridor. No SubQ emphysema. No meningismus. Hematological/Lymphatic/Immunilogical:   No cervical lymphadenopathy. Cardiovascular:   Tachycardia heart rate 105. Symmetric bilateral radial and DP pulses.  No murmurs.  Respiratory:   Normal respiratory effort without tachypnea nor retractions. Breath sounds are clear and equal bilaterally. No wheezes/rales/rhonchi. Gastrointestinal:   Soft and nontender. Non distended. There is no CVA tenderness.  No rebound, rigidity, or guarding. Genitourinary:   deferred Musculoskeletal:   Normal range of motion in all extremities. No joint effusions.  Tenderness around the rotator cuff of the left shoulder. No bony point tenderness. No instability. Neurologic:   Normal speech and language.  CN 2-10 normal. Motor grossly intact. No gross focal neurologic deficits are appreciated.  Skin:    Skin is warm, dry and intact. No rash noted.  No petechiae, purpura, or bullae.  ____________________________________________     LABS (pertinent positives/negatives) (all labs ordered are listed, but only abnormal results are displayed) Labs Reviewed  BASIC METABOLIC PANEL - Abnormal; Notable for the following:       Result Value   Potassium 3.4 (*)    Chloride 99 (*)    Calcium 8.5 (*)    All other components within normal limits  CBC WITH DIFFERENTIAL/PLATELET - Abnormal; Notable for the following:    WBC 14.3 (*)    Hemoglobin 10.7 (*)    HCT 32.9 (*)    RDW 18.1 (*)    Platelets 498 (*)    Neutro Abs 11.4 (*)    Monocytes Absolute 1.2 (*)    All other components within normal limits  URINALYSIS, COMPLETE (UACMP) WITH MICROSCOPIC - Abnormal; Notable for the following:    Color, Urine AMBER (*)    APPearance CLOUDY (*)    Ketones, ur 20 (*)    Protein, ur 30 (*)    Squamous Epithelial / LPF 6-30 (*)    All other components within normal limits  URINE CULTURE   ____________________________________________   EKG  Interpreted by me Sinus tachycardia rate 105, normal axis intervals QRS ST segments and T waves.  ____________________________________________    RADIOLOGY  Dg Chest 2 View  Result Date: 01/10/2017 CLINICAL DATA:  Shortness of breath.  History of lung carcinoma EXAM: CHEST  2 VIEW COMPARISON:  January 03, 2017 FINDINGS: There is pleural effusion with airspace consolidation throughout much of the right lung. There is underlying interstitial pulmonary edema. On the left, there is interstitial edema but no consolidation. Heart is upper normal in size with pulmonary vascularity within normal limits. No adenopathy evident. There is aortic atherosclerosis. Port-A-Cath tip is in the superior vena cava with the tip near the junction with the right atrium. No pneumothorax. There is upper lumbar dextroscoliosis. No blastic or lytic bone lesions are evident. There is calcific tendinosis in the left shoulder. IMPRESSION: There is underlying diffuse interstitial prominence with lobular septal  thickening. This finding could be indicative of congestive heart failure but also could indicate a degree of lymphangitic spread of tumor. There is pleural effusion and patchy airspace consolidation throughout much of the right lung, stable. No new opacity is evident. Stable cardiac silhouette. There is  aortic atherosclerosis. Electronically Signed   By: Lowella Grip III M.D.   On: 01/10/2017 12:27   Dg Shoulder Left  Result Date: 01/10/2017 CLINICAL DATA:  Left shoulder pain and limited range of motion. Fell at home yesterday. EXAM: LEFT SHOULDER - 2+ VIEW COMPARISON:  11/20/2013 FINDINGS: Humeral head is properly located. No glenohumeral joint finding. Normal humeral acromial distance. Extensive calcification in the distal rotator cuff and/or bursa up. AC joint osteoarthritis. Regional ribs are normal. IMPRESSION: No acute finding by radiography. Calcification in the distal rotator cuff and/or bursa. Electronically Signed   By: Nelson Chimes M.D.   On: 01/10/2017 10:32    ____________________________________________   PROCEDURES Procedures  ____________________________________________   INITIAL IMPRESSION / ASSESSMENT AND PLAN / ED COURSE  Pertinent labs & imaging results that were available during my care of the patient were reviewed by me and considered in my medical decision making (see chart for details).  Patient presents with generalized weakness and left shoulder pain after mechanical fall related to not using her walker. X-ray of the shoulder is negative. Labs show white blood cell count slightly elevated at 14,000. This is nonspecific and can be related to her acute pain. However, due to comorbidities, checked a chest x-ray and urinalysis. These are essentially unchanged on her unremarkable but the UA does show some evidence of kidney stones and some microscopic hematuria. She is not having any pain related to this, but because microscopic hematuria can also be present with  cystitis, I'll start the patient on a course of antibiotics given her comorbidities. Urine culture sent. Follow up with primary care. Follow-up with home health and physical therapy. At this point the patient does not appear to be dehydrated. No evidence of meningitis encephalitis pneumonia pyelonephritis sepsis soft tissue infection. Low suspicion of ACS PE or dissection. She is suitable for outpatient follow-up and nontoxic. She seems to be her chronic baseline.     Clinical Course as of Jan 11 1511  Wed Jan 10, 2017  1503 UA c/w kidney stones. With other sx will tx for poss. UTI given significant comorbidities.  [PS]    Clinical Course User Index [PS] Carrie Mew, MD     ----------------------------------------- 3:28 PM on 01/10/2017 -----------------------------------------  Contrary to prior discussions with the patient and she now feels that she is unable to manage at home due to her weakness. Given her past medical history and her weakness today, I'll get a CT angiogram of her chest to ensure she doesn't have some occult pathology such as pulmonary embolism. We'll get a social work consult and physical therapy.  ____________________________________________   FINAL CLINICAL IMPRESSION(S) / ED DIAGNOSES  Final diagnoses:  Strain of left shoulder, initial encounter  Lower urinary tract infectious disease  Microscopic hematuria      New Prescriptions   CEPHALEXIN (KEFLEX) 500 MG CAPSULE    Take 1 capsule (500 mg total) by mouth 2 (two) times daily.   TRAMADOL (ULTRAM) 50 MG TABLET    Take 1 tablet (50 mg total) by mouth every 6 (six) hours as needed.     Portions of this note were generated with dragon dictation software. Dictation errors may occur despite best attempts at proofreading.    Carrie Mew, MD 01/10/17 1518    Carrie Mew, MD 01/10/17 [phone]

## 2017-01-10 NOTE — ED Notes (Signed)
This RN to bedside at this time to introduce self to patient and family. Pt is noted to be alert and oriented at this time. This RN explained MD had placed orders for a chest X-ray and that a urine sample would be needed. Pt states she would probably have to use a bedpan. Pt visualized in NAD, skin warm, dry, and intact, currently on 2L O2 via [place]. Pt given 2 warm blankets per request, use of call bell discussed with patient and family. This RN will continue to monitor for further patient needs.

## 2017-01-11 MED ORDER — OXYCODONE HCL 5 MG PO TABS: 5.0000 mg | ORAL_TABLET | Freq: Three times a day (TID) | ORAL | Status: DC | PRN

## 2017-01-11 MED ORDER — FUROSEMIDE 40 MG PO TABS
20.0000 mg | ORAL_TABLET | Freq: Every day | ORAL | Status: DC
  Administered 2017-01-11 – 2017-01-12 (×2): 20 mg via ORAL
  Filled 2017-01-11 (×2): qty 1

## 2017-01-11 MED ORDER — PROMETHAZINE HCL 25 MG PO TABS: 25.0000 mg | ORAL_TABLET | Freq: Four times a day (QID) | ORAL | Status: DC | PRN

## 2017-01-11 MED ORDER — LEVOFLOXACIN 750 MG PO TABS
750.0000 mg | ORAL_TABLET | Freq: Every day | ORAL | Status: DC
  Administered 2017-01-11 – 2017-01-12 (×2): 750 mg via ORAL
  Filled 2017-01-11 (×2): qty 1

## 2017-01-11 MED ORDER — GABAPENTIN 300 MG PO CAPS
300.0000 mg | ORAL_CAPSULE | Freq: Three times a day (TID) | ORAL | Status: DC
  Administered 2017-01-11 – 2017-01-12 (×3): 300 mg via ORAL
  Filled 2017-01-11 (×3): qty 1

## 2017-01-11 MED ORDER — DOCUSATE SODIUM 100 MG PO CAPS: 100.0000 mg | ORAL_CAPSULE | Freq: Two times a day (BID) | ORAL | Status: DC | PRN

## 2017-01-11 MED ORDER — PANTOPRAZOLE SODIUM 40 MG PO TBEC
40.0000 mg | DELAYED_RELEASE_TABLET | Freq: Every day | ORAL | Status: DC
  Administered 2017-01-11 – 2017-01-12 (×2): 40 mg via ORAL
  Filled 2017-01-11 (×2): qty 1

## 2017-01-11 MED ORDER — ALPRAZOLAM 0.5 MG PO TABS: 0.5000 mg | ORAL_TABLET | Freq: Two times a day (BID) | ORAL | Status: DC | PRN

## 2017-01-11 NOTE — ED Notes (Signed)
Social Work at bedside 

## 2017-01-11 NOTE — ED Provider Notes (Signed)
  Physical Exam  BP 107/72   Pulse (!) 101   Temp 98.2 F (36.8 C) (Oral)   Resp 20   Ht '5\' 5"'$  (1.651 m)   Wt 168 lb 5 oz (76.3 kg)   SpO2 100%   BMI 28.01 kg/m   Physical Exam  ED Course  Procedures  MDM Care assumed at sign out at 3pm. Patient has hx of metastatic lung cancer and was discharged on 4/21. She had malignant pleural effusions and had thoracentesis. Had seen oncology but unfortunately she has poor prognosis and hospice was offered. She came in yesterday for a fall. cxr showed that cancer likely progressed. CT showed metastatic cancer and R lung opacified. I offered US thoracentesis again but she doesn't want it at this time. She is on 2 L [place] which is baseline. WBC 14 but she doesn't appear septic. Previous provider consulted Social work and case management, who saw patient and patient is currently getting evaluated for rehab. Will continue levaquin that she was discharged on and give pain meds prn.     Drenda Freeze, MD 01/11/17 [phone]

## 2017-01-11 NOTE — ED Notes (Signed)
Pt lying on bed, resting. Rise and fall of chest noted. Lights dimmed for comfort.

## 2017-01-11 NOTE — ED Notes (Signed)
Patient attempted to have BM on bedpan, was unable to have BM, had small amount of urine.

## 2017-01-11 NOTE — ED Notes (Signed)
Pt given breakfast tray, sat up in bed to eat

## 2017-01-11 NOTE — ED Notes (Addendum)
Pt called out stating she had wet the bed. Pt linen and brief changed by this RN and Mickel Baas EDT. Pt cleaned. New linen, chuck and brief placed on pt. Pt then stated she needed to urinate again, placed on bedpan by Mickel Baas, EDT

## 2017-01-11 NOTE — ED Notes (Signed)
Pt swallowed pills well. Pt given water to take with pills. Pt has visitor at bedside. No distress noted.

## 2017-01-11 NOTE — ED Notes (Signed)
Pt took night time medications with water. Multiple cups of water at bedside. Pt laid back down after med administration. Pt verbalized comfort with blankets covering her. Lights dimmed for rest.

## 2017-01-11 NOTE — ED Notes (Signed)
Pt called out stating she needed to urinate. Pt used walker to ambulate to toilet. Pt had steady gait. Pt kept oxygen on the while time. New gown placed on pt. Pt had BM in bed, new chuck placed on pt.

## 2017-01-11 NOTE — ED Notes (Signed)
Patient continues to sleep soundly at this time.

## 2017-01-11 NOTE — ED Notes (Signed)
Patient placed on bed pan, unable to urinate at this time. No other needs currently.

## 2017-01-11 NOTE — ED Notes (Signed)
Pt ate most of lunch.

## 2017-01-11 NOTE — ED Notes (Signed)
Pt called out stating she wanted a bath. Given warm wet wipes and helped cleaned by [facility] ED medic.

## 2017-01-11 NOTE — ED Notes (Signed)
Pt assisted on bedpan to void 

## 2017-01-11 NOTE — ED Notes (Signed)
Pt given lunch tray and apple juice. Pt family visiting.

## 2017-01-11 NOTE — Clinical Social Work Note (Signed)
CSW presented bed offers to patient and she chose [facility] [place].  [facility] can accept patient once insurance approval has been received.  [facility] will begin insurance authorization today, CSW continuing to follow patient's progress throughout discharge planning.  Jones Broom. [place], MSW, [place]  01/11/2017 11:51 AM

## 2017-01-11 NOTE — ED Notes (Signed)
Pt assisted to use bedside commode, sheets changed on bed, pt assisted back to bed after commode use, given call bell and covered with sheets, pt denies any needs at present

## 2017-01-12 ENCOUNTER — Telehealth: Payer: Self-pay | Admitting: *Deleted

## 2017-01-12 LAB — URINE CULTURE: Culture: 60000 — AB

## 2017-01-12 NOTE — Telephone Encounter (Signed)
Asking for Dr Grayland Ormond to call her regarding the fact she is in the hospital and they are going to send her to [facility], they also want to drain fluid off of her again and she wants to discuss her CT scan results, Asking to please call her at [phone].  She has called twice already asking for a return call

## 2017-01-12 NOTE — ED Notes (Signed)
Report called to [facility] at [facility]

## 2017-01-12 NOTE — ED Provider Notes (Signed)
-----------------------------------------   4:59 AM on 01/12/2017 -----------------------------------------   Blood pressure 135/84, pulse (!) 105, temperature 98.2 F (36.8 C), temperature source Oral, resp. rate (!) 23, height '5\' 5"'$  (1.651 m), weight 76.3 kg, SpO2 98 %.  The patient had no acute events since last update.  Calm and cooperative at this time.  Home meds were ordered prior to my arrival.  Requires placement by social work.   Hinda Kehr, MD 01/12/17 0500

## 2017-01-12 NOTE — Telephone Encounter (Signed)
Informed patient of Dr Gary Fleet response.

## 2017-01-12 NOTE — Telephone Encounter (Signed)
Agree with draining more fluid and going to [facility].  She has an appointment on Tuesday and I would rather discuss her results in person then.  Thanks.

## 2017-01-12 NOTE — ED Notes (Signed)
Called EMS for transport [phone]

## 2017-01-12 NOTE — ED Notes (Addendum)
Report given to EMS

## 2017-01-12 NOTE — Progress Notes (Signed)
LCSW ED consulted with LCSW Medical and patient has been accepted at [facility] and will require EMS transport.   Patient is to go to Room 502 and call report is to go to [place]   [employer] LCSW [phone]

## 2017-01-15 ENCOUNTER — Other Ambulatory Visit: Payer: Self-pay | Admitting: Oncology

## 2017-01-15 NOTE — Progress Notes (Signed)
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  INTERVAL HISTORY: Patient returns to clinic today for further evaluation and hospital follow-up. Her performance status continues to decline. She has persistent shortness of breath. She has left shoulder pain from a recent fall and increased weakness in her left arm. She has increased swelling in her bilateral lower extremity. She is more lethargic. She has no other neurologic complaint. She denies any fevers. She denies any chest pain. She has a poor appetite, but denies any nausea, vomiting, constipation, or diarrhea. She has no urinary complaints. Patient generally feels terrible, but offers no further specific complaints.   REVIEW OF SYSTEMS:   Review of Systems  Constitutional: Positive for malaise/fatigue. Negative for fever and weight loss.  HENT: Negative.   Eyes: Negative.   Respiratory: Positive for cough and shortness of breath. Negative for hemoptysis.   Cardiovascular: Positive for leg swelling. Negative for chest pain.  Gastrointestinal: Negative for abdominal pain and nausea.  Genitourinary: Negative.   Musculoskeletal: Positive for back pain, falls and joint pain.  Neurological: Positive for weakness. Negative for sensory change.  Psychiatric/Behavioral: Positive for depression. The patient is not nervous/anxious.     As per HPI. Otherwise, a complete review of systems is negative.  PAST MEDICAL HISTORY: Past Medical History:  Diagnosis Date  . Anemia 01/21/2015  . 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 Anxiety   . GERD (gastroesophageal reflux disease)   . Hypercholesteremia   . Hypertension   . Last menstrual period  (LMP) > 10 days ago 2007  . Lung cancer ([place])   . Metastatic lung cancer (metastasis from lung to other site) [facility]) 01/21/2015    PAST SURGICAL HISTORY: Past Surgical History:  Procedure Laterality Date  . ABDOMINAL HYSTERECTOMY     partial  . INSERTION / PLACEMENT PLEURAL CATHETER    . PORTACATH PLACEMENT Right 04/26/2015   Procedure: INSERTION PORT-A-CATH;  Surgeon: Nestor Lewandowsky, MD;  Location: [facility] ORS;  Service: General;  Laterality: Right;    FAMILY HISTORY: Family History  Problem Relation Age of Onset  . Cervical cancer Mother   . CVA Mother   . Uterine cancer Maternal Grandmother   . CVA Father     ADVANCED DIRECTIVES (Y/N):  N  HEALTH MAINTENANCE: Social History  Substance Use Topics  . Smoking status: Former Smoker    Packs/day: 0.50    Years: 40.00    Types: Cigarettes    Quit date: 07/20/2014  . Smokeless tobacco: Never Used  . Alcohol use Yes     Comment: socially     Colonoscopy:  PAP:  Bone density:  Lipid panel:  Allergies  Allergen Reactions  . Tegaderm Ag Mesh [Silver] Other (See Comments)    blisters  . Aspirin Other (See Comments)    GIB    Current Outpatient Prescriptions  Medication Sig Dispense Refill  . ALPRAZolam (XANAX) 1 MG tablet TAKE 1/2 BY MOUTH 1 TO 2 TIMES DAILY AS NEEDED FOR ANXIETY AND TAKE 1 BY MOUTH AT NIGHT FOR ANXIETY/SLEEP 45 tablet 0  . alum & mag hydroxide-simeth (MAALOX/MYLANTA) 200-200-20 MG/5ML suspension Take 15 mLs by mouth every 6 (six) hours as needed for indigestion or heartburn. 355 mL 0  . cephALEXin (KEFLEX)  500 MG capsule Take 1 capsule (500 mg total) by mouth 2 (two) times daily. 14 capsule 0  . dexamethasone (DECADRON) 4 MG tablet   0  . diphenhydramine-acetaminophen (TYLENOL PM) 25-500 MG TABS tablet Take 1 tablet by mouth at bedtime as needed (sleep).    . docusate sodium (COLACE) 100 MG capsule Take 100 mg by mouth 2 (two) times daily as needed for mild constipation.    . furosemide (LASIX) 20 MG tablet  Take 1 tablet (20 mg total) by mouth daily. 30 tablet 2  . gabapentin (NEURONTIN) 300 MG capsule Take 1 capsule (300 mg total) by mouth 3 (three) times daily. 270 capsule 3  . lidocaine-prilocaine (EMLA) cream Apply cream 1 hour before chemotherapy treatment 30 g 1  . lisinopril-hydrochlorothiazide (PRINZIDE,ZESTORETIC) 10-12.5 MG tablet TK 1 T PO D FOR HIGH BP  3  . loratadine (CLARITIN) 10 MG tablet Take 10 mg by mouth daily.    . Multiple Vitamins-Minerals (MULTIVITAMIN WITH MINERALS) tablet Take 1 tablet by mouth daily. 30 tablet 3  . omeprazole (PRILOSEC) 20 MG capsule Take 1 capsule (20 mg total) by mouth daily. 30 capsule 0  . oxyCODONE (OXY IR/ROXICODONE) 5 MG immediate release tablet Take 1 tablet (5 mg total) by mouth 3 (three) times daily as needed for severe pain. 90 tablet 0  . potassium chloride SA (K-DUR,KLOR-CON) 20 MEQ tablet Take 1 tablet (20 mEq total) by mouth daily. 90 tablet 1  . promethazine (PHENERGAN) 25 MG tablet Take 1 tablet (25 mg total) by mouth every 6 (six) hours as needed for nausea or vomiting. 30 tablet 0  . simvastatin (ZOCOR) 20 MG tablet Take 20 mg by mouth at bedtime.     . sucralfate (CARAFATE) 1 g tablet Take 1 tablet (1 g total) by mouth 4 (four) times daily. 120 tablet 1  . traMADol (ULTRAM) 50 MG tablet Take 1 tablet (50 mg total) by mouth every 6 (six) hours as needed. 15 tablet 0  . vitamin B-12 (CYANOCOBALAMIN) 1000 MCG tablet Take 1,000 mcg by mouth daily.     No current facility-administered medications for this visit.    Facility-Administered Medications Ordered in Other Visits  Medication Dose Route Frequency Provider Last Rate Last Dose  . heparin lock flush 100 unit/mL  250 Units Intracatheter PRN Evlyn Kanner, NP      . sodium chloride 0.9 % injection 10 mL  10 mL Intracatheter PRN Evlyn Kanner, NP      . sodium chloride 0.9 % injection 10 mL  10 mL Intravenous PRN Leia Alf, MD   10 mL at 04/27/15 0905  . sodium chloride 0.9 %  injection 3 mL  3 mL Intracatheter PRN Evlyn Kanner, NP        OBJECTIVE: Vitals:   01/16/17 1033  BP: 115/81  Pulse: (!) 105     There is no height or weight on file to calculate BMI.    ECOG FS:3 - Symptomatic, >50% confined to bed  General: Ill-appearing, no acute distress. HEENT: Swelling of right side of his resolved. Eyes: Pink conjunctiva, anicteric sclera. Lungs: Diminished breath sounds bilaterally. Heart: Regular rate and rhythm. No rubs, murmurs, or gallops. Abdomen: Soft, nontender, nondistended. No organomegaly noted, normoactive bowel sounds. Musculoskeletal: Bilateral lower extremity edema.  Neuro: Alert, answering all questions appropriately. Cranial nerves grossly intact. Skin: No rashes or petechiae noted. Psych: Normal affect.   LAB RESULTS:  Lab Results  Component Value Date   NA 136 01/16/2017  K 3.8 01/16/2017   CL 100 (L) 01/16/2017   CO2 29 01/16/2017   GLUCOSE 102 (H) 01/16/2017   BUN 19 01/16/2017   CREATININE 0.51 01/16/2017   CALCIUM 8.7 (L) 01/16/2017   PROT 5.8 (L) 01/16/2017   ALBUMIN 2.4 (L) 01/16/2017   AST 14 (L) 01/16/2017   ALT 9 (L) 01/16/2017   ALKPHOS 47 01/16/2017   BILITOT 0.7 01/16/2017   GFRNONAA >60 01/16/2017   GFRAA >60 01/16/2017    Lab Results  Component Value Date   WBC 13.4 (H) 01/16/2017   NEUTROABS 9.9 (H) 01/16/2017   HGB 10.9 (L) 01/16/2017   HCT 33.5 (L) 01/16/2017   MCV 80.8 01/16/2017   PLT 317 01/16/2017     STUDIES: Dg Chest 1 View  Result Date: 01/03/2017 CLINICAL DATA:  History of metastatic lung cancer, now with recurrent symptomatic left-sided pleural effusion. Post left-sided thoracentesis EXAM: CHEST 1 VIEW COMPARISON:  Chest radiograph - 01/02/2017 FINDINGS: Grossly unchanged cardiac silhouette and mediastinal contours. Interval reduction/resolution of left-sided pleural effusion post thoracentesis. No pneumothorax. Pleuroparenchymal thickening about the periphery of the right lung is  unchanged. Grossly unchanged nodular interstitial opacities throughout the bilateral pulmonary parenchyma, right greater than left. No discrete focal airspace opacities. No evidence of edema peer Stable position of support apparatus. No acute osseus abnormalities. IMPRESSION: 1. Interval reduction/resolution of left-sided effusion post thoracentesis. No pneumothorax. 2. Similar findings compatible with provided history of metastatic lung cancer with chronic right-sided pleuroparenchymal thickening and findings worrisome for lymphangitic spread of tumor involving the bilateral lungs, right greater than left, grossly unchanged. 3. No new focal airspace opacities. Electronically Signed   By: Sandi Mariscal M.D.   On: 01/03/2017 15:34   Dg Chest 2 View  Result Date: 01/10/2017 CLINICAL DATA:  Shortness of breath.  History of lung carcinoma EXAM: CHEST  2 VIEW COMPARISON:  January 03, 2017 FINDINGS: There is pleural effusion with airspace consolidation throughout much of the right lung. There is underlying interstitial pulmonary edema. On the left, there is interstitial edema but no consolidation. Heart is upper normal in size with pulmonary vascularity within normal limits. No adenopathy evident. There is aortic atherosclerosis. Port-A-Cath tip is in the superior vena cava with the tip near the junction with the right atrium. No pneumothorax. There is upper lumbar dextroscoliosis. No blastic or lytic bone lesions are evident. There is calcific tendinosis in the left shoulder. IMPRESSION: There is underlying diffuse interstitial prominence with lobular septal thickening. This finding could be indicative of congestive heart failure but also could indicate a degree of lymphangitic spread of tumor. There is pleural effusion and patchy airspace consolidation throughout much of the right lung, stable. No new opacity is evident. Stable cardiac silhouette. There is aortic atherosclerosis. Electronically Signed   By: Lowella Grip III M.D.   On: 01/10/2017 12:27   Dg Chest 2 View  Result Date: 12/24/2016 CLINICAL DATA:  Stage four right-sided non-small cell lung cancer. EXAM: CHEST  2 VIEW COMPARISON:  11/28/2016 and chest CT 11/30/2016 FINDINGS: Right IJ Port-A-Cath unchanged. Patient slightly rotated to the right. Small to moderate right-sided pleural effusion tracking to the apex slightly worse. Persistent interstitial prominence over the mid to lower left lung and persistent hazy heterogeneous opacification of the right mid to lower lung. Cardiomediastinal silhouette and remainder of the exam is unchanged. IMPRESSION: Slight worsening moderate size right pleural effusion tracking to the apex. Stable heterogeneous opacification over the right mid to lower lung and interstitial prominence over the  left mid to lower lung. Electronically Signed   By: Marin Olp M.D.   On: 12/24/2016 15:54   Ct Angio Chest Pe W And/or Wo Contrast  Result Date: 01/10/2017 CLINICAL DATA:  Generalized weakness. Tachycardia. History of cancer. EXAM: CT ANGIOGRAPHY CHEST WITH CONTRAST TECHNIQUE: Multidetector CT imaging of the chest was performed using the standard protocol during bolus administration of intravenous contrast. Multiplanar CT image reconstructions and MIPs were obtained to evaluate the vascular anatomy. CONTRAST:  75 cc Isovue 370 intravenous COMPARISON:  11/30/2016 FINDINGS: Cardiovascular: Satisfactory opacification of the pulmonary arteries to the segmental level. No evidence of pulmonary embolism. Pulmonary arteries on the right are attenuated due to the degree of parenchymal disease. No flow seen into the right inferior pulmonary vein. Atherosclerosis. No acute systemic arterial finding. Mediastinum/Nodes: Hilar tissues on the right are not distinguishable separate from parenchymal disease. No discrete enlarged lymph nodes. Lungs/Pleura: Diffuse masslike opacification of the right lung which generalized asymmetric septal  thickening, probable lymphangitic tumor. There are areas of cavitation in the upper lobe which may have progressed. The right lung is comparatively small. Numerous metastases throughout the left lung. There is pleural thickening with small effusion throughout the right thoracic cavity. Moderate to large layering left pleural effusion. Upper Abdomen: Soft tissue density mass in the upper left kidney measuring 3 cm, possible metastasis. Visible portion has enlarged since 11/30/2016 chest CT. Musculoskeletal: No acute finding. Review of the MIP images confirms the above findings. IMPRESSION: 1. Negative for pulmonary embolism. 2. Widespread metastatic disease, progressed since 11/30/2016 chest CT. Majority of the right lung is now opacified. 3. Moderate to large left and small right pleural effusions. Electronically Signed   By: Monte Fantasia M.D.   On: 01/10/2017 15:58   Dg Chest Portable 1 View  Result Date: 01/02/2017 CLINICAL DATA:  Stage IV lung malignancy. Unable to receive chemotherapy today due to generalized weakness. Currently cleaning being treated with clindamycin for breast infection. Known metastatic lung malignancy. EXAM: PORTABLE CHEST 1 VIEW COMPARISON:  PA and lateral chest x-ray of December 24, 2016 FINDINGS: There is persistent volume loss on the right. The interstitial markings remain increased. A moderate sized right pleural effusion laterally and inferiorly persists. On the left the interstitial markings are slightly more conspicuous overall. The cardiac silhouette is not enlarged. There is calcification in the wall of the aortic arch. The central pulmonary vascularity is prominent. The bony thorax exhibits no acute abnormality. IMPRESSION: Persistent increased interstitial markings in the right lung with moderate-sized right pleural effusion. Slight interval worsening in the appearance of the left lung interstitial markings. The findings may reflect asymmetric pulmonary edema or worsening  pneumonia. Lymphangitic spread of malignancy could present in a similar fashion. Electronically Signed   By: David  Martinique M.D.   On: 01/02/2017 12:02   Dg Shoulder Left  Result Date: 01/10/2017 CLINICAL DATA:  Left shoulder pain and limited range of motion. Fell at home yesterday. EXAM: LEFT SHOULDER - 2+ VIEW COMPARISON:  11/20/2013 FINDINGS: Humeral head is properly located. No glenohumeral joint finding. Normal humeral acromial distance. Extensive calcification in the distal rotator cuff and/or bursa up. AC joint osteoarthritis. Regional ribs are normal. IMPRESSION: No acute finding by radiography. Calcification in the distal rotator cuff and/or bursa. Electronically Signed   By: Nelson Chimes M.D.   On: 01/10/2017 10:32   US Breast Ltd Uni Right Inc Axilla  Result Date: 12/29/2016 CLINICAL DATA:  Right chest and breast pain for 3 days. Right breast is red.  Concern for cellulitis or possible abscess. Patient had chemotherapy treatment on Tuesday for lung cancer. Submitted for interpretation on 12/29/2016. EXAM: ULTRASOUND OF THE RIGHT BREAST COMPARISON:  CT of the chest 11/30/2016 FINDINGS: Targeted ultrasound is performed, showing significant parenchymal edema in the images provided. There is skin thickening in the images provided, demonstrating for quadrant edema. No fluid collection identified. IMPRESSION: Significant parenchymal and skin edema. Considerations include mastitis or malignancy. Further evaluation is indicated as malignancy has not been excluded. No imaged drainable fluid collection. RECOMMENDATION: Diagnostic mammography and right breast ultrasound are recommended for further evaluation. I have discussed the findings and recommendations with the patient. Results were also provided in writing at the conclusion of the visit. If applicable, a reminder letter will be sent to the patient regarding the next appointment. BI-RADS CATEGORY  0: Incomplete. Need additional imaging evaluation and/or  prior mammograms for comparison. Electronically Signed   By: Nolon Nations M.D.   On: 12/29/2016 14:00   US Thoracentesis Asp Pleural Space W/img Guide  Result Date: 01/03/2017 INDICATION: History of metastatic lung cancer, now with recurrent symptomatic left-sided pleural effusion. Please from ultrasound-guided thoracentesis for diagnostic and therapeutic purposes. EXAM: US THORACENTESIS ASP PLEURAL SPACE W/IMG GUIDE COMPARISON:  Chest CT - 11/30/2016; attempted ultrasound-guided right-sided thoracentesis - 10/26/2016 MEDICATIONS: None. COMPLICATIONS: None immediate. TECHNIQUE: Informed written consent was obtained from the patient after a discussion of the risks, benefits and alternatives to treatment. A timeout was performed prior to the initiation of the procedure. Initial ultrasound scanning demonstrates a moderate sized left-sided pleural effusion. The lower chest was prepped and draped in the usual sterile fashion. 1% lidocaine was used for local anesthesia. An ultrasound image was saved for documentation purposes. An 8 Fr Safe-T-Centesis catheter was introduced. The thoracentesis was performed. The catheter was removed and a dressing was applied. The patient tolerated the procedure well without immediate post procedural complication. The patient was escorted to have an upright chest radiograph. FINDINGS: A total of approximately 1 liter of serous fluid was removed. Requested samples were sent to the laboratory. IMPRESSION: Successful ultrasound-guided left sided thoracentesis yielding 1 liter of pleural fluid. Electronically Signed   By: Sandi Mariscal M.D.   On: 01/03/2017 15:39    ONCOLOGY HISTORY: Stage IV ([place] NX M1) right lung non-small cell lung cancer (adenocarcinoma) with malignant right pleural effusion, lymph node and left lung metastasis. On 12/01/14 - Right Pleural Fluid Cytology. POSITIVE FOR MALIGNANT CELLS. ADENOCARCINOMA, CONSISTENT WITH LUNG ORIGIN. Molecular testing for EGFR, ALK and  others is negative. Patient initially was started on carboplatinum and pemetrexed and completed 6 cycles on April 06, 2015. She was then switched to maintenance pemetrexed until December 15, 2015 at which time she was noted to have progressive disease. Patient then received nivolumab from Jan 18, 2016 through March 28, 2016 and then had progressive disease. She then received single agent Taxotere April 18, 2016 through October 03, 2016. Patient initiated single agent Abraxane on October 30, 2016 which was discontinued after 3 cycles on November 21, 2016 for progression of disease. Patient initiated vinorelbine and gemcitabine on December 12, 2016.   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  PLAN:  1. 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 After lengthy discussion with the patient, she does not wish to undergo any further treatments and  has agreed to hospice care. She is currently at [facility], but desires to go home and have hospice follow her there. No further interventions are needed. No follow-up has been scheduled. Referral to hospice has been made.  2. Headaches: MRI the brain completed on March 03, 2016 is essentially unchanged from previous. 3.  Pain (back, hip, arm): MRI and bone scan results reviewed independently revealing metastatic disease in C6 there is likely causing her symptoms. Lumbar MRI revealed an enhancing lesion and L2 as well as a suspicious 12 mm lesion and right sacrum. Patient has completed XRT. Continue gabapentin 300 mg 3 times per day. Continue 10 mg oxycodone as needed.  4.  Kidney lesion: Unclear if metastatic deposit or second primary of the left kidney. Continue to monitor closely. 5.  Anxiety: Resolved. Continue Xanax 1 mg at night and 0.5 mg as  needed during the day. 6.  Hypokalemia: Patient's potassium within normal limits. Continue oral potassium supplementation. 7.  Bony lesions: No further treatments planned.. 8.  Peripheral edema: Continue Lasix PRN. Will use with caution given patient's history of dehydration. 9.  Shortness of breath: Patient had recent thoracentesis. CT as above with progression of disease. 10. Facial swelling: Resolved.   Patient expressed understanding and was in agreement with this plan. She also understands that She can call clinic at any time with any questions, concerns, or complaints.   Cancer Staging Metastatic lung cancer (metastasis from lung to other site) [facility]) Staging form: Lung, AJCC 7th Edition - Clinical: Stage Unknown ([place], NX, M1) - Signed by Evlyn Kanner, NP on 01/21/2015   Lloyd Huger, MD   01/16/2017 3:43 PM

## 2017-01-16 ENCOUNTER — Inpatient Hospital Stay (HOSPITAL_BASED_OUTPATIENT_CLINIC_OR_DEPARTMENT_OTHER): Payer: BLUE CROSS/BLUE SHIELD | Admitting: Oncology

## 2017-01-16 ENCOUNTER — Inpatient Hospital Stay: Payer: BLUE CROSS/BLUE SHIELD | Attending: Oncology

## 2017-01-16 ENCOUNTER — Encounter: Payer: Self-pay | Admitting: Oncology

## 2017-01-16 ENCOUNTER — Inpatient Hospital Stay: Payer: BLUE CROSS/BLUE SHIELD

## 2017-01-16 VITALS — BP 115/81 | HR 105

## 2017-01-16 DIAGNOSIS — I10 Essential (primary) hypertension: Secondary | ICD-10-CM

## 2017-01-16 DIAGNOSIS — Z87891 Personal history of nicotine dependence: Secondary | ICD-10-CM

## 2017-01-16 DIAGNOSIS — Z9221 Personal history of antineoplastic chemotherapy: Secondary | ICD-10-CM | POA: Diagnosis not present

## 2017-01-16 DIAGNOSIS — R0602 Shortness of breath: Secondary | ICD-10-CM

## 2017-01-16 DIAGNOSIS — C7951 Secondary malignant neoplasm of bone: Secondary | ICD-10-CM | POA: Insufficient documentation

## 2017-01-16 DIAGNOSIS — R51 Headache: Secondary | ICD-10-CM | POA: Diagnosis not present

## 2017-01-16 DIAGNOSIS — R5381 Other malaise: Secondary | ICD-10-CM | POA: Diagnosis not present

## 2017-01-16 DIAGNOSIS — R05 Cough: Secondary | ICD-10-CM | POA: Diagnosis not present

## 2017-01-16 DIAGNOSIS — Z9181 History of falling: Secondary | ICD-10-CM | POA: Insufficient documentation

## 2017-01-16 DIAGNOSIS — C3491 Malignant neoplasm of unspecified part of right bronchus or lung: Secondary | ICD-10-CM

## 2017-01-16 DIAGNOSIS — J910 Malignant pleural effusion: Secondary | ICD-10-CM

## 2017-01-16 DIAGNOSIS — C7802 Secondary malignant neoplasm of left lung: Secondary | ICD-10-CM

## 2017-01-16 DIAGNOSIS — M25512 Pain in left shoulder: Secondary | ICD-10-CM | POA: Insufficient documentation

## 2017-01-16 DIAGNOSIS — N289 Disorder of kidney and ureter, unspecified: Secondary | ICD-10-CM | POA: Diagnosis not present

## 2017-01-16 DIAGNOSIS — R630 Anorexia: Secondary | ICD-10-CM | POA: Diagnosis not present

## 2017-01-16 DIAGNOSIS — F418 Other specified anxiety disorders: Secondary | ICD-10-CM

## 2017-01-16 DIAGNOSIS — R600 Localized edema: Secondary | ICD-10-CM | POA: Diagnosis not present

## 2017-01-16 DIAGNOSIS — R531 Weakness: Secondary | ICD-10-CM

## 2017-01-16 DIAGNOSIS — Z8049 Family history of malignant neoplasm of other genital organs: Secondary | ICD-10-CM

## 2017-01-16 DIAGNOSIS — E7800 Pure hypercholesterolemia, unspecified: Secondary | ICD-10-CM | POA: Diagnosis not present

## 2017-01-16 DIAGNOSIS — Z923 Personal history of irradiation: Secondary | ICD-10-CM | POA: Insufficient documentation

## 2017-01-16 DIAGNOSIS — R5383 Other fatigue: Secondary | ICD-10-CM | POA: Insufficient documentation

## 2017-01-16 DIAGNOSIS — K219 Gastro-esophageal reflux disease without esophagitis: Secondary | ICD-10-CM | POA: Diagnosis not present

## 2017-01-16 DIAGNOSIS — E876 Hypokalemia: Secondary | ICD-10-CM | POA: Insufficient documentation

## 2017-01-16 DIAGNOSIS — Z79899 Other long term (current) drug therapy: Secondary | ICD-10-CM | POA: Diagnosis not present

## 2017-01-16 DIAGNOSIS — Z8041 Family history of malignant neoplasm of ovary: Secondary | ICD-10-CM

## 2017-01-16 LAB — CBC WITH DIFFERENTIAL/PLATELET
Basophils Absolute: 0.2 10*3/uL — ABNORMAL HIGH (ref 0–0.1)
Basophils Relative: 1 %
Eosinophils Absolute: 0.3 10*3/uL (ref 0–0.7)
Eosinophils Relative: 3 %
HEMATOCRIT: 33.5 % — AB (ref 35.0–47.0)
HEMOGLOBIN: 10.9 g/dL — AB (ref 12.0–16.0)
LYMPHS ABS: 2.1 10*3/uL (ref 1.0–3.6)
Lymphocytes Relative: 16 %
MCH: 26.3 pg (ref 26.0–34.0)
MCHC: 32.5 g/dL (ref 32.0–36.0)
MCV: 80.8 fL (ref 80.0–100.0)
MONO ABS: 0.9 10*3/uL (ref 0.2–0.9)
MONOS PCT: 6 %
NEUTROS ABS: 9.9 10*3/uL — AB (ref 1.4–6.5)
NEUTROS PCT: 74 %
Platelets: 317 10*3/uL (ref 150–440)
RBC: 4.15 MIL/uL (ref 3.80–5.20)
RDW: 18.1 % — AB (ref 11.5–14.5)
WBC: 13.4 10*3/uL — ABNORMAL HIGH (ref 3.6–11.0)

## 2017-01-16 LAB — COMPREHENSIVE METABOLIC PANEL
ALK PHOS: 47 U/L (ref 38–126)
ALT: 9 U/L — ABNORMAL LOW (ref 14–54)
ANION GAP: 7 (ref 5–15)
AST: 14 U/L — ABNORMAL LOW (ref 15–41)
Albumin: 2.4 g/dL — ABNORMAL LOW (ref 3.5–5.0)
BILIRUBIN TOTAL: 0.7 mg/dL (ref 0.3–1.2)
BUN: 19 mg/dL (ref 6–20)
CALCIUM: 8.7 mg/dL — AB (ref 8.9–10.3)
CO2: 29 mmol/L (ref 22–32)
Chloride: 100 mmol/L — ABNORMAL LOW (ref 101–111)
Creatinine, Ser: 0.51 mg/dL (ref 0.44–1.00)
GLUCOSE: 102 mg/dL — AB (ref 65–99)
Potassium: 3.8 mmol/L (ref 3.5–5.1)
Sodium: 136 mmol/L (ref 135–145)
TOTAL PROTEIN: 5.8 g/dL — AB (ref 6.5–8.1)

## 2017-01-16 MED ORDER — HEPARIN SOD (PORK) LOCK FLUSH 100 UNIT/ML IV SOLN
500.0000 [IU] | Freq: Once | INTRAVENOUS | Status: AC
  Administered 2017-01-16: 500 [IU] via INTRAVENOUS
  Filled 2017-01-16: qty 5

## 2017-01-16 MED ORDER — SODIUM CHLORIDE 0.9% FLUSH
10.0000 mL | INTRAVENOUS | Status: DC | PRN
  Administered 2017-01-16: 10 mL via INTRAVENOUS
  Filled 2017-01-16: qty 10

## 2017-01-17 ENCOUNTER — Ambulatory Visit: Admission: RE | Admit: 2017-01-17 | Payer: BLUE CROSS/BLUE SHIELD | Source: Ambulatory Visit

## 2017-01-17 ENCOUNTER — Inpatient Hospital Stay: Admission: RE | Admit: 2017-01-17 | Payer: BLUE CROSS/BLUE SHIELD | Source: Ambulatory Visit

## 2017-01-17 ENCOUNTER — Ambulatory Visit: Payer: BLUE CROSS/BLUE SHIELD | Attending: Family Medicine

## 2017-01-22 ENCOUNTER — Other Ambulatory Visit: Payer: Self-pay | Admitting: Oncology

## 2017-01-22 ENCOUNTER — Telehealth: Payer: Self-pay | Admitting: *Deleted

## 2017-01-22 MED ORDER — FENTANYL 25 MCG/HR TD PT72: 25.0000 ug | MEDICATED_PATCH | TRANSDERMAL | 0 refills | Status: DC

## 2017-01-22 NOTE — Telephone Encounter (Signed)
Requesting to start patient on Fentanyl as she is having difficulty swallowing. Also would like to see which of her oral meds can be stopped due to difficulty swallowing. Please advise.

## 2017-01-22 NOTE — Telephone Encounter (Signed)
Yes, fentanyl  37mg q72hrs.  Ok to stop oral meds as indicated.

## 2017-01-22 NOTE — Telephone Encounter (Signed)
Lucile Crater of Dr Virgel Manifold orders and Rx will be faxed to [facility]

## 2017-01-25 ENCOUNTER — Telehealth: Payer: Self-pay | Admitting: *Deleted

## 2017-01-25 MED ORDER — DEXAMETHASONE 4 MG PO TABS
4.0000 mg | ORAL_TABLET | Freq: Every day | ORAL | 6 refills | Status: AC
Start: 2017-01-25 — End: ?

## 2017-01-25 NOTE — Telephone Encounter (Signed)
Per Dr Grayland Ormond Decadron 4 mg daily. E scribed and Sarah notified

## 2017-01-25 NOTE — Telephone Encounter (Signed)
Requesting low dose steroids for her as she is having a lot of new sx, Increased SOB, new onset left shoulder pain, not eating or drinking due to difficulty swallowing, unable to even transfer from bed to chair, confusion. Has a glazed look about her. Please advise

## 2017-02-05 ENCOUNTER — Other Ambulatory Visit: Payer: Self-pay | Admitting: *Deleted

## 2017-02-05 MED ORDER — MORPHINE SULFATE (CONCENTRATE) 20 MG/ML PO SOLN: ORAL | 0 refills | Status: DC

## 2017-02-05 MED ORDER — FENTANYL 25 MCG/HR TD PT72: 25.0000 ug | MEDICATED_PATCH | TRANSDERMAL | 0 refills | Status: DC

## 2017-02-07 ENCOUNTER — Telehealth: Payer: Self-pay | Admitting: *Deleted

## 2017-02-07 NOTE — Telephone Encounter (Signed)
Per Dr B, OK to increase Dose to 50 mcg of Fentanyl, Sarah notified

## 2017-02-07 NOTE — Telephone Encounter (Signed)
Having increased pain and asking to increase Fentanyl to 50 mcg

## 2017-02-13 ENCOUNTER — Other Ambulatory Visit: Payer: Self-pay | Admitting: *Deleted

## 2017-02-13 MED ORDER — FENTANYL 50 MCG/HR TD PT72: 50.0000 ug | MEDICATED_PATCH | TRANSDERMAL | 0 refills | Status: DC

## 2017-02-19 ENCOUNTER — Other Ambulatory Visit: Payer: Self-pay | Admitting: *Deleted

## 2017-02-19 MED ORDER — FENTANYL 50 MCG/HR TD PT72: 50.0000 ug | MEDICATED_PATCH | TRANSDERMAL | 0 refills | Status: AC

## 2017-02-26 ENCOUNTER — Other Ambulatory Visit: Payer: Self-pay | Admitting: *Deleted

## 2017-02-26 MED ORDER — MORPHINE SULFATE (CONCENTRATE) 20 MG/ML PO SOLN: ORAL | 0 refills | Status: DC

## 2017-02-26 MED ORDER — MORPHINE SULFATE (CONCENTRATE) 20 MG/ML PO SOLN: ORAL | 0 refills | Status: AC

## 2017-03-12 ENCOUNTER — Telehealth: Payer: Self-pay | Admitting: *Deleted

## 2017-03-18 NOTE — Telephone Encounter (Signed)
Called to report that patient passed away. No time or date reported

## 2017-03-18 DEATH — deceased

## 2017-03-24 ENCOUNTER — Other Ambulatory Visit: Payer: Self-pay | Admitting: Nurse Practitioner

## 2017-09-23 IMAGING — CT CT ANGIO CHEST
2 of 7 series · 18 of 46 positions shown · IV contrast (APPLIED)
Comparison: 11/30/2016

CLINICAL DATA: Generalized weakness. Tachycardia. History of
cancer.

EXAM:
CT ANGIOGRAPHY CHEST WITH CONTRAST
TECHNIQUE: Multidetector CT imaging of the chest was performed using the
standard protocol during bolus administration of intravenous
contrast. Multiplanar CT image reconstructions and MIPs were
obtained to evaluate the vascular anatomy.
CONTRAST:  75 cc Isovue 370 intravenous

[Series 5: thins · axial · 0.68mm/px · z∈[-654,-410]mm · 16 of 273 slices shown]
[im 15/273  lung]
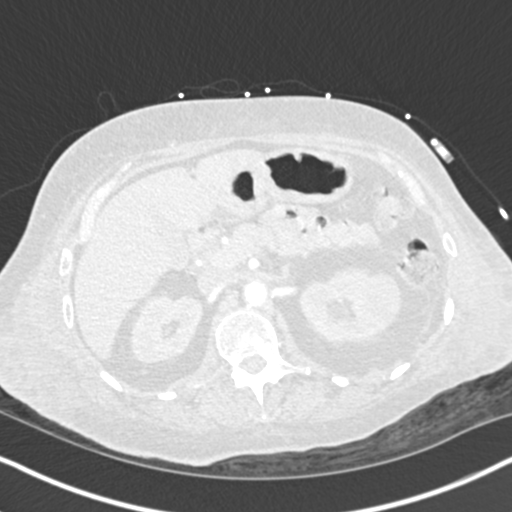
[im 29/273  soft-tissue]
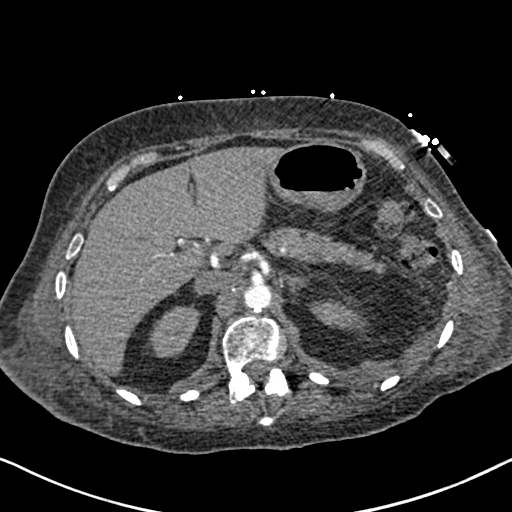
[im 43/273  lung]
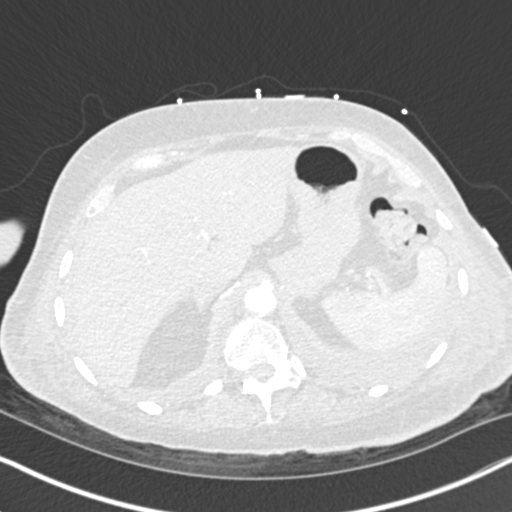
[im 58/273  soft-tissue]
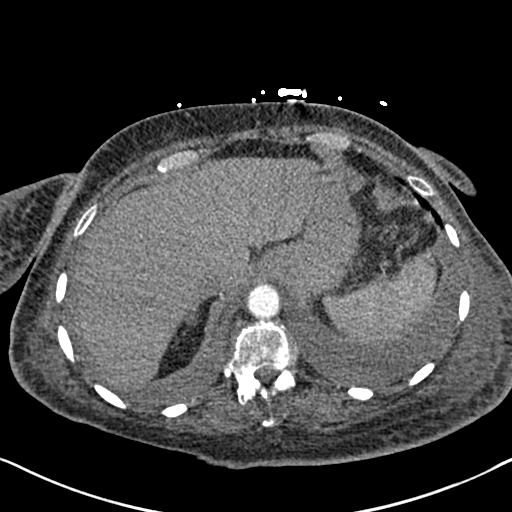
[im 86/273  lung]
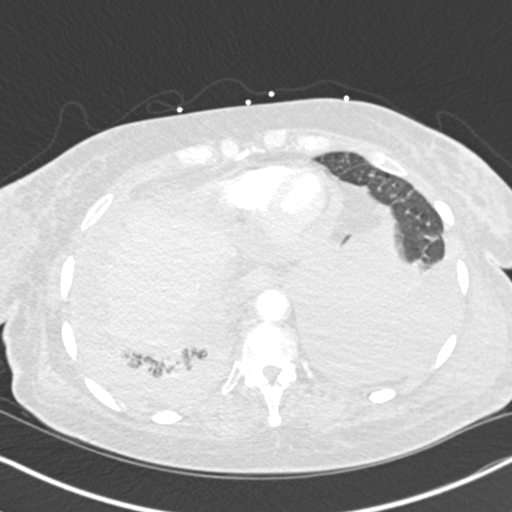
[im 101/273  soft-tissue]
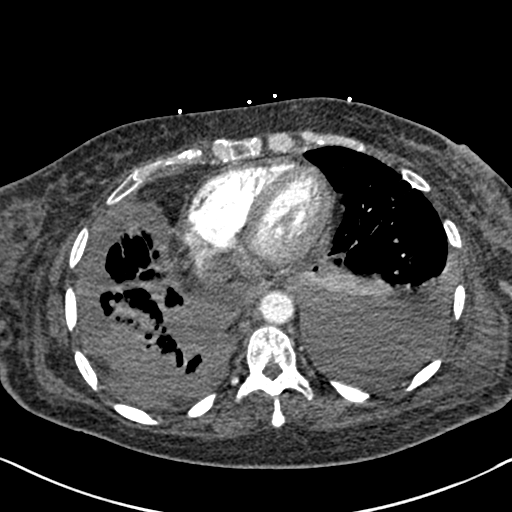
[im 115/273  lung]
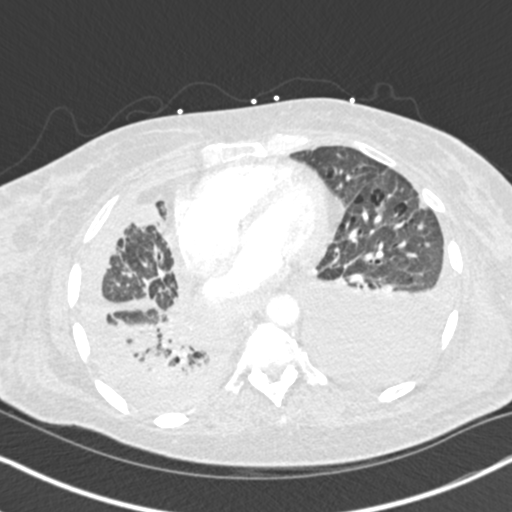
[im 129/273  soft-tissue]
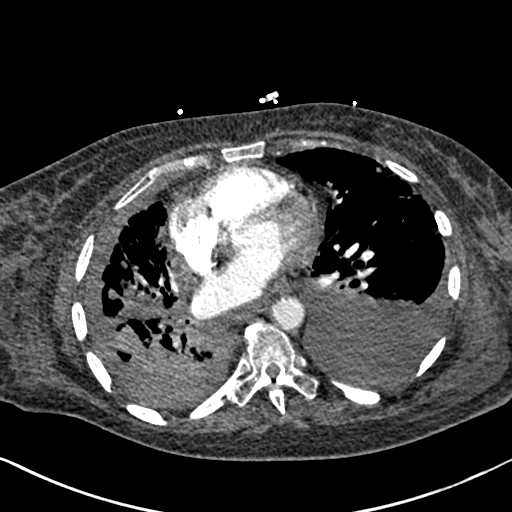
[im 144/273  lung]
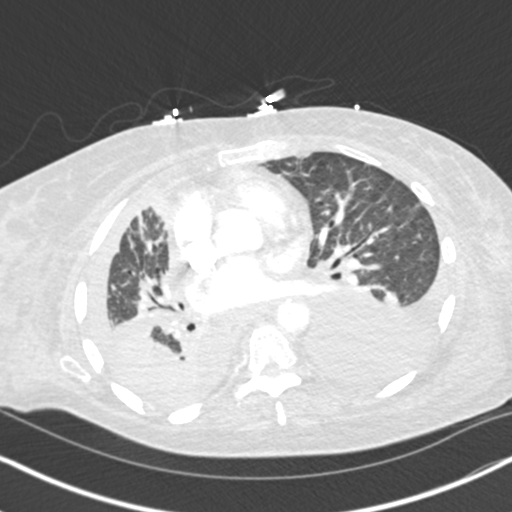
[im 158/273  soft-tissue]
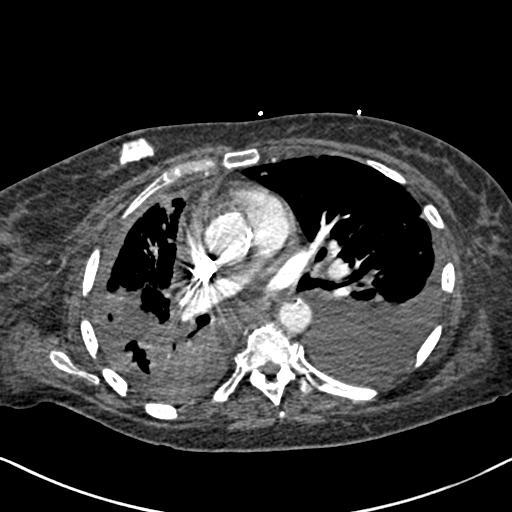
[im 172/273  lung]
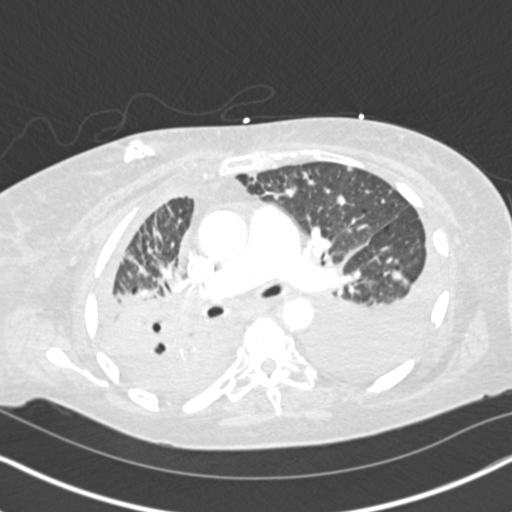
[im 187/273  soft-tissue]
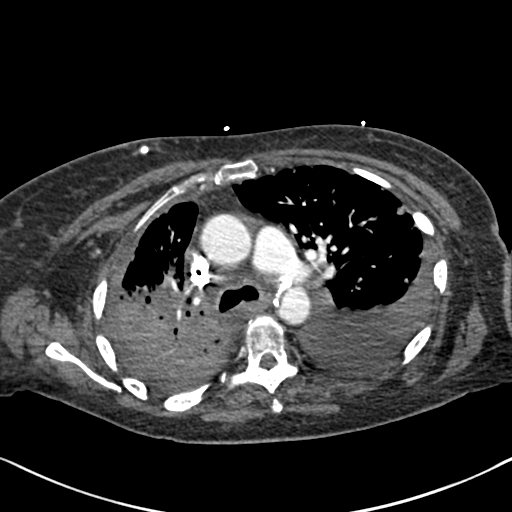
[im 215/273  lung]
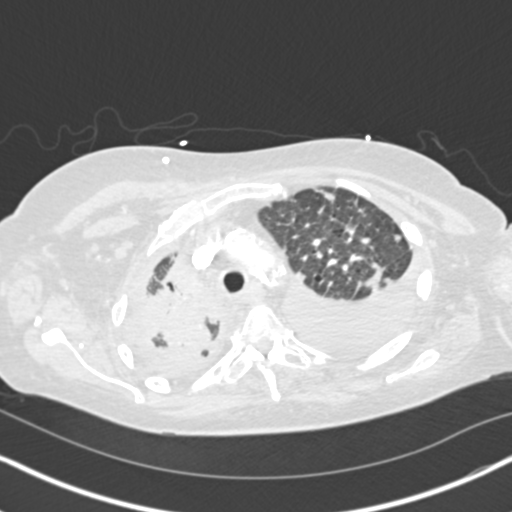
[im 230/273  soft-tissue]
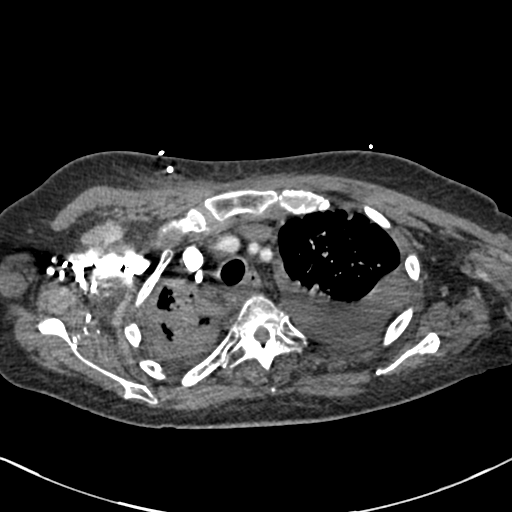
[im 244/273  lung]
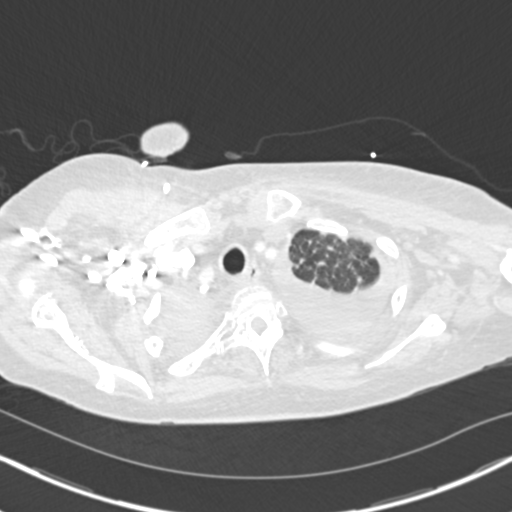
[im 258/273  soft-tissue]
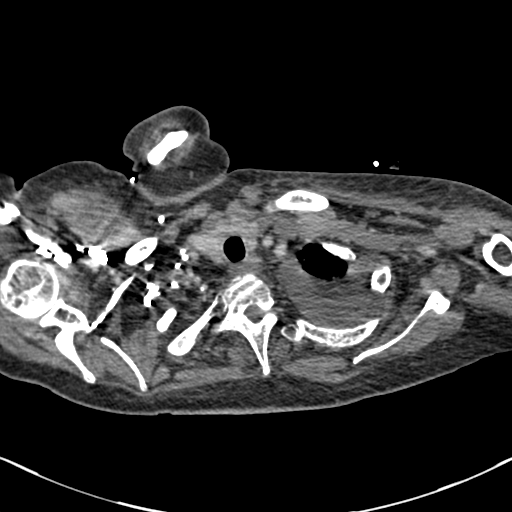

[Series 7: coronal mpr · coronal · 0.53mm/px · 2 of 75 slices shown]
[im 25/75  soft-tissue]
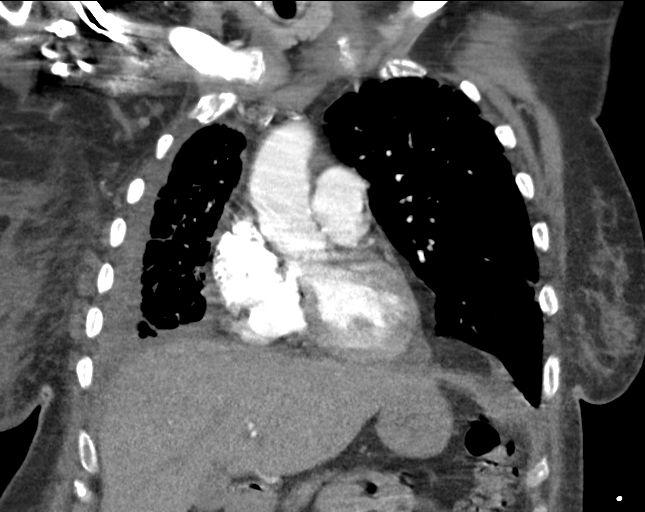
[im 50/75  soft-tissue]
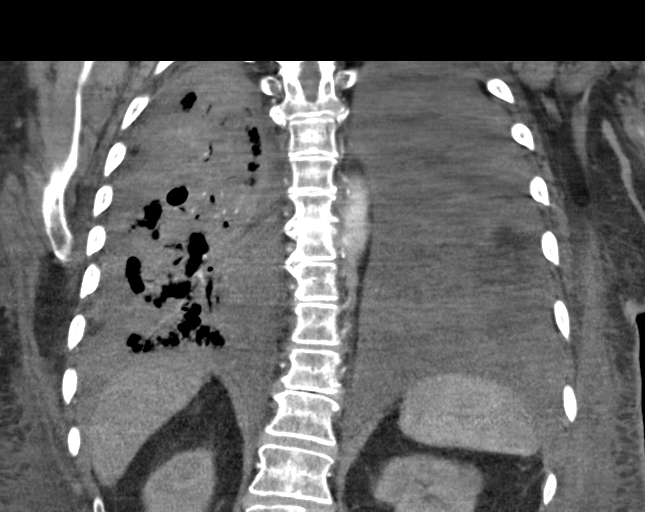

[18 of 46 positions shown; findings below may reference images not displayed]

FINDINGS: Cardiovascular: Satisfactory opacification of the pulmonary arteries
to the segmental level. No evidence of pulmonary embolism. Pulmonary
arteries on the right are attenuated due to the degree of
parenchymal disease. No flow seen into the right inferior pulmonary
vein. Atherosclerosis. No acute systemic arterial finding.

Mediastinum/Nodes: Hilar tissues on the right are not
distinguishable separate from parenchymal disease. No discrete
enlarged lymph nodes.

Lungs/Pleura: Diffuse masslike opacification of the right lung which
generalized asymmetric septal thickening, probable lymphangitic
tumor. There are areas of cavitation in the upper lobe which may
have progressed. The right lung is comparatively small. Numerous
metastases throughout the left lung. There is pleural thickening
with small effusion throughout the right thoracic cavity. Moderate
to large layering left pleural effusion.

Upper Abdomen: Soft tissue density mass in the upper left kidney
measuring 3 cm, possible metastasis. Visible portion has enlarged
since 11/30/2016 chest CT.

Musculoskeletal: No acute finding.

Review of the MIP images confirms the above findings.
IMPRESSION: 1. Negative for pulmonary embolism.
2. Widespread metastatic disease, progressed since 11/30/2016 chest
CT. Majority of the right lung is now opacified.
3. Moderate to large left and small right pleural effusions.
